# Patient Record
Sex: Female | Born: 1945 | Race: White | Hispanic: No | Marital: Married | Smoking: Former smoker
Health system: Southern US, Community
[De-identification: ages and names within clinical notes are randomized; demographics above are authoritative.]

## PROBLEM LIST (undated history)

## (undated) DIAGNOSIS — E119 Type 2 diabetes mellitus without complications: Secondary | ICD-10-CM

## (undated) DIAGNOSIS — R238 Other skin changes: Secondary | ICD-10-CM

## (undated) DIAGNOSIS — C50919 Malignant neoplasm of unspecified site of unspecified female breast: Secondary | ICD-10-CM

## (undated) DIAGNOSIS — I809 Phlebitis and thrombophlebitis of unspecified site: Secondary | ICD-10-CM

## (undated) DIAGNOSIS — E7800 Pure hypercholesterolemia, unspecified: Secondary | ICD-10-CM

## (undated) DIAGNOSIS — R233 Spontaneous ecchymoses: Secondary | ICD-10-CM

## (undated) DIAGNOSIS — G8929 Other chronic pain: Secondary | ICD-10-CM

## (undated) DIAGNOSIS — Z9221 Personal history of antineoplastic chemotherapy: Secondary | ICD-10-CM

## (undated) DIAGNOSIS — J302 Other seasonal allergic rhinitis: Secondary | ICD-10-CM

## (undated) DIAGNOSIS — R51 Headache: Secondary | ICD-10-CM

## (undated) DIAGNOSIS — C801 Malignant (primary) neoplasm, unspecified: Secondary | ICD-10-CM

## (undated) DIAGNOSIS — M1990 Unspecified osteoarthritis, unspecified site: Secondary | ICD-10-CM

## (undated) DIAGNOSIS — M549 Dorsalgia, unspecified: Secondary | ICD-10-CM

## (undated) DIAGNOSIS — I10 Essential (primary) hypertension: Secondary | ICD-10-CM

## (undated) HISTORY — PX: COLONOSCOPY: SHX174

## (undated) HISTORY — PX: TUBAL LIGATION: SHX77

## (undated) HISTORY — PX: APPENDECTOMY: SHX54

## (undated) HISTORY — PX: BREAST EXCISIONAL BIOPSY: SUR124

## (undated) HISTORY — DX: Pure hypercholesterolemia, unspecified: E78.00

---

## 1998-12-18 ENCOUNTER — Emergency Department (HOSPITAL_COMMUNITY): Admission: EM | Admit: 1998-12-18 | Discharge: 1998-12-18 | Payer: Self-pay | Admitting: Emergency Medicine

## 2000-01-07 ENCOUNTER — Emergency Department (HOSPITAL_COMMUNITY): Admission: EM | Admit: 2000-01-07 | Discharge: 2000-01-07 | Payer: Self-pay | Admitting: Emergency Medicine

## 2000-01-07 ENCOUNTER — Encounter: Payer: Self-pay | Admitting: Emergency Medicine

## 2000-02-27 ENCOUNTER — Encounter: Payer: Self-pay | Admitting: Emergency Medicine

## 2000-02-27 ENCOUNTER — Emergency Department (HOSPITAL_COMMUNITY): Admission: EM | Admit: 2000-02-27 | Discharge: 2000-02-27 | Payer: Self-pay | Admitting: Emergency Medicine

## 2000-11-13 ENCOUNTER — Emergency Department (HOSPITAL_COMMUNITY): Admission: EM | Admit: 2000-11-13 | Discharge: 2000-11-13 | Payer: Self-pay | Admitting: Emergency Medicine

## 2000-11-13 ENCOUNTER — Encounter: Payer: Self-pay | Admitting: Emergency Medicine

## 2000-11-26 ENCOUNTER — Emergency Department (HOSPITAL_COMMUNITY): Admission: EM | Admit: 2000-11-26 | Discharge: 2000-11-26 | Payer: Self-pay | Admitting: Emergency Medicine

## 2001-04-18 ENCOUNTER — Emergency Department (HOSPITAL_COMMUNITY): Admission: EM | Admit: 2001-04-18 | Discharge: 2001-04-18 | Payer: Self-pay | Admitting: Emergency Medicine

## 2001-06-14 ENCOUNTER — Encounter: Admission: RE | Admit: 2001-06-14 | Discharge: 2001-06-28 | Payer: Self-pay | Admitting: Occupational Medicine

## 2003-10-17 ENCOUNTER — Emergency Department (HOSPITAL_COMMUNITY): Admission: AD | Admit: 2003-10-17 | Discharge: 2003-10-17 | Payer: Self-pay | Admitting: Family Medicine

## 2004-02-21 ENCOUNTER — Encounter: Admission: RE | Admit: 2004-02-21 | Discharge: 2004-02-21 | Payer: Self-pay | Admitting: Internal Medicine

## 2004-04-29 ENCOUNTER — Encounter: Admission: RE | Admit: 2004-04-29 | Discharge: 2004-05-14 | Payer: Self-pay | Admitting: Nurse Practitioner

## 2004-05-27 ENCOUNTER — Ambulatory Visit (HOSPITAL_COMMUNITY): Admission: RE | Admit: 2004-05-27 | Discharge: 2004-05-27 | Payer: Self-pay | Admitting: Gastroenterology

## 2008-11-20 ENCOUNTER — Emergency Department (HOSPITAL_COMMUNITY): Admission: EM | Admit: 2008-11-20 | Discharge: 2008-11-20 | Payer: Self-pay | Admitting: Emergency Medicine

## 2009-06-25 ENCOUNTER — Emergency Department (HOSPITAL_COMMUNITY): Admission: EM | Admit: 2009-06-25 | Discharge: 2009-06-25 | Payer: Self-pay | Admitting: Family Medicine

## 2009-12-17 ENCOUNTER — Emergency Department (HOSPITAL_COMMUNITY): Admission: EM | Admit: 2009-12-17 | Discharge: 2009-12-17 | Payer: Self-pay | Admitting: Emergency Medicine

## 2011-01-11 ENCOUNTER — Encounter: Payer: Self-pay | Admitting: Internal Medicine

## 2011-05-08 NOTE — Op Note (Signed)
Yvonne [name], Yvonne [name]                      ACCOUNT NO.:  [id number]   MEDICAL RECORD NO.:  [id number]                   PATIENT TYPE:  AMB   LOCATION:  ENDO                                 FACILITY:  [facility]   PHYSICIAN:  James L. Malon Kindle., M.D.          DATE OF BIRTH:  09-10-1946   DATE OF PROCEDURE:  05/27/2004  DATE OF DISCHARGE:                                 OPERATIVE REPORT   PROCEDURE:  Colonoscopy.   MEDICATIONS GIVEN:  Fentanyl 100 mcg, Versed 10 mg IV.   INDICATIONS FOR PROCEDURE:  Strong family history of colon cancer, two of  her siblings have had colon cancer.   DESCRIPTION OF PROCEDURE:  The procedure was explained and patient consent  obtained.  With the patient in the left lateral decubitus position, the  Olympus adjustable pediatric colonoscope was inserted and advanced.  The  prep was marginal, I was able to irrigate and got a quite good look after  irrigation.  There was still some sticky adherent stool in places.  I was  able to easily advance to the cecum with the use of abdominal pressure and  position changes.  The ileocecal valve and appendiceal orifice were seen.  The scope was withdrawn.  The cecum, ascending colon, hepatic flexure,  transverse colon, splenic flexure, descending and sigmoid colon were seen  well.  No polyps seen.  No significant diverticular disease.  The rectum was  free of polyps.  The scope was withdrawn.  The patient tolerated the  procedure well.   ASSESSMENT:  Normal colonoscopy in a woman with a very strong family history  of colon cancer, V16.0.   PLAN:  Will recommend repeating in five years.  Check stool for blood  yearly.                                               James L. Malon Kindle., M.D.    Yvonne [name]  D:  05/27/2004  T:  05/27/2004  Job:  [id number]   cc:   Fleet Contras, M.D.  [address]  [place]  [place] [address]  Fax: [phone]

## 2011-12-22 HISTORY — PX: MASTECTOMY: SHX3

## 2012-02-17 ENCOUNTER — Other Ambulatory Visit: Payer: Self-pay | Admitting: Internal Medicine

## 2012-02-17 DIAGNOSIS — Z1231 Encounter for screening mammogram for malignant neoplasm of breast: Secondary | ICD-10-CM

## 2012-03-02 ENCOUNTER — Ambulatory Visit
Admission: RE | Admit: 2012-03-02 | Discharge: 2012-03-02 | Disposition: A | Discharge status: Home / Self Care | Payer: Self-pay | Source: Ambulatory Visit | Attending: Internal Medicine | Admitting: Internal Medicine

## 2012-03-02 DIAGNOSIS — Z1231 Encounter for screening mammogram for malignant neoplasm of breast: Secondary | ICD-10-CM

## 2012-03-08 ENCOUNTER — Other Ambulatory Visit: Payer: Self-pay | Admitting: Internal Medicine

## 2012-03-08 DIAGNOSIS — R928 Other abnormal and inconclusive findings on diagnostic imaging of breast: Secondary | ICD-10-CM

## 2012-03-17 ENCOUNTER — Other Ambulatory Visit: Payer: Self-pay

## 2012-03-25 ENCOUNTER — Other Ambulatory Visit: Payer: Self-pay | Admitting: Internal Medicine

## 2012-03-25 ENCOUNTER — Ambulatory Visit
Admission: RE | Admit: 2012-03-25 | Discharge: 2012-03-25 | Disposition: A | Discharge status: Home / Self Care | Payer: 59 | Source: Ambulatory Visit | Attending: Internal Medicine | Admitting: Internal Medicine

## 2012-03-25 DIAGNOSIS — C801 Malignant (primary) neoplasm, unspecified: Secondary | ICD-10-CM

## 2012-03-25 DIAGNOSIS — R928 Other abnormal and inconclusive findings on diagnostic imaging of breast: Secondary | ICD-10-CM

## 2012-03-25 HISTORY — DX: Malignant (primary) neoplasm, unspecified: C80.1

## 2012-03-28 ENCOUNTER — Other Ambulatory Visit: Payer: Self-pay | Admitting: Internal Medicine

## 2012-03-28 ENCOUNTER — Ambulatory Visit
Admission: RE | Admit: 2012-03-28 | Discharge: 2012-03-28 | Disposition: A | Discharge status: Home / Self Care | Payer: 59 | Source: Ambulatory Visit | Attending: Internal Medicine | Admitting: Internal Medicine

## 2012-03-28 DIAGNOSIS — C50911 Malignant neoplasm of unspecified site of right female breast: Secondary | ICD-10-CM

## 2012-03-28 DIAGNOSIS — R928 Other abnormal and inconclusive findings on diagnostic imaging of breast: Secondary | ICD-10-CM

## 2012-03-29 ENCOUNTER — Other Ambulatory Visit: Payer: Self-pay | Admitting: *Deleted

## 2012-03-29 DIAGNOSIS — C50411 Malignant neoplasm of upper-outer quadrant of right female breast: Secondary | ICD-10-CM | POA: Insufficient documentation

## 2012-03-29 DIAGNOSIS — C50419 Malignant neoplasm of upper-outer quadrant of unspecified female breast: Secondary | ICD-10-CM

## 2012-04-01 ENCOUNTER — Ambulatory Visit (HOSPITAL_COMMUNITY)
Admission: RE | Admit: 2012-04-01 | Discharge: 2012-04-01 | Disposition: A | Discharge status: Home / Self Care | Payer: 59 | Source: Ambulatory Visit | Attending: Internal Medicine | Admitting: Internal Medicine

## 2012-04-01 DIAGNOSIS — C50419 Malignant neoplasm of upper-outer quadrant of unspecified female breast: Secondary | ICD-10-CM | POA: Insufficient documentation

## 2012-04-01 DIAGNOSIS — C50911 Malignant neoplasm of unspecified site of right female breast: Secondary | ICD-10-CM

## 2012-04-01 DIAGNOSIS — C773 Secondary and unspecified malignant neoplasm of axilla and upper limb lymph nodes: Secondary | ICD-10-CM | POA: Insufficient documentation

## 2012-04-01 LAB — CREATININE, SERUM: GFR calc Af Amer: 77 mL/min — ABNORMAL LOW (ref >90)

## 2012-04-01 MED ORDER — GADOBENATE DIMEGLUMINE 529 MG/ML IV SOLN
20.0000 mL | Freq: Once | INTRAVENOUS | Status: AC | PRN
  Administered 2012-04-01: 20 mL via INTRAVENOUS

## 2012-04-04 ENCOUNTER — Other Ambulatory Visit: Payer: Self-pay | Admitting: Internal Medicine

## 2012-04-04 DIAGNOSIS — R921 Mammographic calcification found on diagnostic imaging of breast: Secondary | ICD-10-CM

## 2012-04-05 ENCOUNTER — Ambulatory Visit
Admission: RE | Admit: 2012-04-05 | Discharge: 2012-04-05 | Disposition: A | Discharge status: Home / Self Care | Payer: 59 | Source: Ambulatory Visit | Attending: Internal Medicine | Admitting: Internal Medicine

## 2012-04-05 ENCOUNTER — Other Ambulatory Visit: Payer: Self-pay | Admitting: Internal Medicine

## 2012-04-05 DIAGNOSIS — R921 Mammographic calcification found on diagnostic imaging of breast: Secondary | ICD-10-CM

## 2012-04-05 DIAGNOSIS — R928 Other abnormal and inconclusive findings on diagnostic imaging of breast: Secondary | ICD-10-CM

## 2012-04-06 ENCOUNTER — Ambulatory Visit: Payer: 59 | Attending: General Surgery | Admitting: Physical Therapy

## 2012-04-06 ENCOUNTER — Other Ambulatory Visit (HOSPITAL_BASED_OUTPATIENT_CLINIC_OR_DEPARTMENT_OTHER): Payer: 59 | Admitting: Lab

## 2012-04-06 ENCOUNTER — Encounter: Payer: Self-pay | Admitting: Oncology

## 2012-04-06 ENCOUNTER — Encounter (INDEPENDENT_AMBULATORY_CARE_PROVIDER_SITE_OTHER): Payer: Self-pay | Admitting: General Surgery

## 2012-04-06 ENCOUNTER — Encounter: Payer: Self-pay | Admitting: *Deleted

## 2012-04-06 ENCOUNTER — Ambulatory Visit (HOSPITAL_BASED_OUTPATIENT_CLINIC_OR_DEPARTMENT_OTHER): Payer: 59 | Admitting: Oncology

## 2012-04-06 ENCOUNTER — Ambulatory Visit (HOSPITAL_BASED_OUTPATIENT_CLINIC_OR_DEPARTMENT_OTHER): Payer: Commercial Managed Care - PPO | Admitting: General Surgery

## 2012-04-06 ENCOUNTER — Ambulatory Visit: Payer: 59

## 2012-04-06 ENCOUNTER — Ambulatory Visit
Admission: RE | Admit: 2012-04-06 | Discharge: 2012-04-06 | Disposition: A | Discharge status: Home / Self Care | Payer: 59 | Source: Ambulatory Visit | Attending: Radiation Oncology | Admitting: Radiation Oncology

## 2012-04-06 VITALS — BP 142/78 | HR 62 | Temp 97.9°F | Ht 64.0 in | Wt 211.6 lb

## 2012-04-06 DIAGNOSIS — C50419 Malignant neoplasm of upper-outer quadrant of unspecified female breast: Secondary | ICD-10-CM

## 2012-04-06 DIAGNOSIS — C50919 Malignant neoplasm of unspecified site of unspecified female breast: Secondary | ICD-10-CM

## 2012-04-06 DIAGNOSIS — C50911 Malignant neoplasm of unspecified site of right female breast: Secondary | ICD-10-CM

## 2012-04-06 DIAGNOSIS — I10 Essential (primary) hypertension: Secondary | ICD-10-CM

## 2012-04-06 DIAGNOSIS — N649 Disorder of breast, unspecified: Secondary | ICD-10-CM

## 2012-04-06 DIAGNOSIS — C773 Secondary and unspecified malignant neoplasm of axilla and upper limb lymph nodes: Secondary | ICD-10-CM

## 2012-04-06 DIAGNOSIS — M1990 Unspecified osteoarthritis, unspecified site: Secondary | ICD-10-CM

## 2012-04-06 DIAGNOSIS — IMO0001 Reserved for inherently not codable concepts without codable children: Secondary | ICD-10-CM | POA: Insufficient documentation

## 2012-04-06 DIAGNOSIS — R293 Abnormal posture: Secondary | ICD-10-CM | POA: Insufficient documentation

## 2012-04-06 DIAGNOSIS — Z9049 Acquired absence of other specified parts of digestive tract: Secondary | ICD-10-CM

## 2012-04-06 DIAGNOSIS — E785 Hyperlipidemia, unspecified: Secondary | ICD-10-CM

## 2012-04-06 DIAGNOSIS — Z170 Estrogen receptor positive status [ER+]: Secondary | ICD-10-CM

## 2012-04-06 LAB — CANCER ANTIGEN 27.29: CA 27.29: 26 U/mL (ref 0–39)

## 2012-04-06 LAB — COMPREHENSIVE METABOLIC PANEL
ALT: 29 U/L (ref 0–35)
AST: 29 U/L (ref 0–37)
Albumin: 3.9 g/dL (ref 3.5–5.2)
BUN: 19 mg/dL (ref 6–23)
CO2: 30 mEq/L (ref 19–32)
Calcium: 10.2 mg/dL (ref 8.4–10.5)
Chloride: 98 mEq/L (ref 96–112)
Potassium: 2.9 mEq/L — ABNORMAL LOW (ref 3.5–5.3)

## 2012-04-06 LAB — CBC WITH DIFFERENTIAL/PLATELET
BASO%: 0.7 % (ref 0.0–2.0)
Eosinophils Absolute: 0.2 10*3/uL (ref 0.0–0.5)
HCT: 39.7 % (ref 34.8–46.6)
HGB: 13.4 g/dL (ref 11.6–15.9)
LYMPH%: 30.7 % (ref 14.0–49.7)
MCHC: 33.6 g/dL (ref 31.5–36.0)
MONO#: 0.4 10*3/uL (ref 0.1–0.9)
NEUT#: 5 10*3/uL (ref 1.5–6.5)
NEUT%: 60.6 % (ref 38.4–76.8)
Platelets: 201 10*3/uL (ref 145–400)
WBC: 8.3 10*3/uL (ref 3.9–10.3)
lymph#: 2.5 10*3/uL (ref 0.9–3.3)

## 2012-04-06 MED ORDER — DEXTROSE 5 % IV SOLN: 900.0000 mg | Freq: Once | INTRAVENOUS | Status: DC

## 2012-04-06 NOTE — Progress Notes (Signed)
Mailed after appt letter to pt. 

## 2012-04-06 NOTE — Progress Notes (Signed)
Patient ID: Yvonne [name], female   DOB: October 19, 1946, 65 y.o.   MRN: [id number]  No chief complaint on file.   HPI Yvonne [name] is a 66 y.o. female.  This patient is seen today in our multidisciplinary breast clinic for a newly diagnosed right breast cancer. She says that she first noticed a lump found on self breast exam and presented to her physician and followup mammogram ultrasound and MRI were ordered. She says that she does her self breast exams routinely and has not noticed anything previously. She did not have any significant family history for breast cancer and denies any systemic symptoms such as headaches, bony pains, or weight loss. 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 The lesion measured approximately 3.8 cm x 3.6 cm in size. She also had another nodule in another location in the right breast and she has a biopsy scheduled for this next week. 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 HPI  Past Medical History  Diagnosis Date  . Elevated cholesterol     Past Surgical History  Procedure Date  . Appendectomy     Family History  Problem Relation Age of Onset  . Cancer Mother     ovarian cancer  . Cancer Sister     colon cancer  . Cancer Brother     colon cancer  . Cancer Brother     prostate cancer    Social History History  Substance Use Topics  . Smoking status: Never Smoker   . Smokeless tobacco: Not on file  . Alcohol Use: No    Allergies  Allergen Reactions  . Penicillins     Current Outpatient Prescriptions  Medication Sig Dispense Refill  . aspirin 81 MG tablet Take 81 mg by  mouth daily.      . Calcium Carbonate-Vitamin D (CALCIUM + D PO) Take 1 tablet by mouth daily.      . celecoxib (CELEBREX) 200 MG capsule Take 200 mg by mouth daily.      . cetirizine (ZYRTEC) 10 MG chewable tablet Chew 10 mg by mouth daily.      . fluticasone (VERAMYST) 27.5 MCG/SPRAY nasal spray Place 2 sprays into the nose daily.      . Multiple Vitamins-Minerals (CERTAVITE SENIOR/ANTIOXIDANT) TABS Take 1 tablet by mouth.      . rosuvastatin (CRESTOR) 20 MG tablet Take 20 mg by mouth daily.      Marland Kitchen triamterene-hydrochlorothiazide (MAXZIDE-25) 37.5-25 MG per tablet Take 1 tablet by mouth daily.        Review of Systems Review of Systems All other review of systems negative or noncontributory except as stated in the HPI  There were no vitals taken for this visit.  Physical Exam Physical Exam Physical Exam  Nursing note and vitals reviewed. Constitutional: She is oriented to person, place, and time. She appears well-developed and well-nourished. No distress.  HENT:  Head: Normocephalic and atraumatic.  Mouth/Throat: No oropharyngeal exudate.  Eyes: Conjunctivae and EOM are normal. Pupils are equal, round, and reactive to light. Right eye exhibits no discharge. Left eye exhibits no discharge. No scleral icterus.  Neck: Normal range of motion. Neck supple.  No tracheal deviation present.  Cardiovascular: Normal rate, regular rhythm, normal heart sounds and intact distal pulses.   Pulmonary/Chest: Effort normal and breath sounds normal. No stridor. No respiratory distress. She has no wheezes.  Abdominal: Soft. Bowel sounds are normal. She exhibits no distension and no mass. There is no tenderness. There is no rebound and no guarding.  Musculoskeletal: Normal range of motion. She exhibits no edema and no tenderness.  Neurological: She is alert and oriented to person, place, and time.  Skin: Skin is warm and dry. No rash noted. She is not diaphoretic. No erythema. No pallor.  Psychiatric:  She has a normal mood and affect. Her behavior is normal. Judgment and thought content normal.  Breast: her right breast shows some dimpling in the upper outer quadrant of the breast with a palpable mass measuring approximately 4-5 cm in diameter consistent with the known carcinoma. Did not appreciate any other palpable or suspicious masses in the right breast and I do not feel any axillary lymphadenopathy on exam. 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 She has already met with the radiation oncologist and the medical oncologist as well to discuss adjuvant therapies. She is most likely going to require chemotherapy and radiation for the axilla as well. We discussed the risks and benefits of breast conservation therapy versus MRM and she would like to proceed with modified radical mastectomy on the right with left-sided Mediport placement and left-sided needle localized lumpectomy. She expressed understanding of the possible need for further surgery on the left side if her biopsy comes back positive. We also discussed the risks of the Mediport including infection, bleeding, pain, scarring, port malfunction, need for open surgery, pneumothorax or hemothorax, and arrhythmia. We also discussed the risks of modified radical mastectomy including infection, bleeding, pain, scarring, recurrent tumor, nerve injury, lymphedema and the need for possible repeat surgery and she expressed understanding and would like to proceed with right-sided modified radical mastectomy  with left-sided Mediport placement and left-sided needle localized lumpectomy. Because she is interested in modified radical mastectomy I do not think that we need to proceed with the right-sided biopsy of the additional lesion as this will be excised with the mastectomy. 45 min. Was spent counseling this patient.    Plan    We will schedule this as soon as possible.       [name] DAVID 04/06/2012, 4:29 PM

## 2012-04-07 ENCOUNTER — Telehealth: Payer: Self-pay | Admitting: *Deleted

## 2012-04-07 ENCOUNTER — Other Ambulatory Visit: Payer: 59 | Admitting: Lab

## 2012-04-07 ENCOUNTER — Other Ambulatory Visit (INDEPENDENT_AMBULATORY_CARE_PROVIDER_SITE_OTHER): Payer: Self-pay | Admitting: General Surgery

## 2012-04-07 ENCOUNTER — Encounter: Payer: 59 | Admitting: Genetic Counselor

## 2012-04-07 ENCOUNTER — Encounter: Payer: Self-pay | Admitting: Oncology

## 2012-04-07 DIAGNOSIS — C50911 Malignant neoplasm of unspecified site of right female breast: Secondary | ICD-10-CM

## 2012-04-07 NOTE — Progress Notes (Signed)
Put cancer form and daughter's fmla paper on nurse's desk.

## 2012-04-07 NOTE — Telephone Encounter (Signed)
gave patient appointment for 04-2012 printed out calendar and gave to the patient 

## 2012-04-08 ENCOUNTER — Encounter: Payer: Self-pay | Admitting: *Deleted

## 2012-04-10 ENCOUNTER — Other Ambulatory Visit: Payer: Self-pay | Admitting: Oncology

## 2012-04-10 DIAGNOSIS — E1129 Type 2 diabetes mellitus with other diabetic kidney complication: Secondary | ICD-10-CM | POA: Insufficient documentation

## 2012-04-10 DIAGNOSIS — E785 Hyperlipidemia, unspecified: Secondary | ICD-10-CM | POA: Insufficient documentation

## 2012-04-10 DIAGNOSIS — E1169 Type 2 diabetes mellitus with other specified complication: Secondary | ICD-10-CM | POA: Insufficient documentation

## 2012-04-10 DIAGNOSIS — E119 Type 2 diabetes mellitus without complications: Secondary | ICD-10-CM | POA: Insufficient documentation

## 2012-04-10 DIAGNOSIS — I10 Essential (primary) hypertension: Secondary | ICD-10-CM | POA: Insufficient documentation

## 2012-04-10 DIAGNOSIS — Z9049 Acquired absence of other specified parts of digestive tract: Secondary | ICD-10-CM | POA: Insufficient documentation

## 2012-04-10 DIAGNOSIS — M1990 Unspecified osteoarthritis, unspecified site: Secondary | ICD-10-CM | POA: Insufficient documentation

## 2012-04-10 HISTORY — DX: Type 2 diabetes mellitus without complications: E11.9

## 2012-04-10 NOTE — Progress Notes (Signed)
ID: ALYSABETH SCALIA   DOB: [birth year]  MR#: [id number]  WUJ#:[id number]  HISTORY OF PRESENT ILLNESS: The patient underwent screening mammography 03/02/2012. (Prior mammogram was 2005). Possible mass was noted in the right breast and she was recalled for additional views 03/25/2012. These showed a dense spiculated mass with overlying skin retraction in the upper outer quadrant of the right breast. There were associated coarse linear calcifications. On physical exam there was an area of dimpling in the upper outer quadrant of the right breast and a 5 cm mass was palpable. By ultrasound, this was irregular, hypoechoic, and measured 3.3 cm. In addition, an abnormal lymph node in the right axilla with the thickened cortex measured 1.8 cm.  In the opposite breast, Dr. Whitney Muse found an irregular hypoechoic mass with lobulated margins measuring 2.3 cm. The left axilla was unremarkable. Bilateral breast MRI was obtained 04/01/2012, and showed the mass in question in the right breast to measure 3.8 cm maximally, with overlying skin retraction. In addition, there was a second enhancing bilobed mass in the lower outer quadrant of the right breast, measuring 1 point centimeter. This was felt to possibly represent an intramammary lymph node.  Biopsy of the right breast and right axillary lymph node 03/25/2012 both showed ([id number])  invasive ductal carcinoma, grade 2, estrogen and progesterone receptor positive, both at 100%, with an MIB-1 39%, and no HER-2 amplification. 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 Biopsy of the second mass in the right breast, which is in a different quadrant from the one already biopsied, is pending.  INTERVAL HISTORY: The patient tolerated her biopsies without unusual complications. She was evaluated in the multidisciplinary breast cancer clinic 04/06/2012 for a definitive treatment  plan.  REVIEW OF SYSTEMS: The changes in the right breast appear long-standing and are easily palpable and noticeable, although the patient denies being aware of them until recently. She has had no systemic symptoms suggestive of metastatic disease, in particular no unexplained weight loss or fatigue, no rash or bleeding, no unusual headaches visual changes or cough, phlegm production or pleurisy. She has mild seasonal sinus allergies. She has chronic low back and joint pain, particularly involving her knees. Otherwise a detailed review of systems was noncontributory  PAST MEDICAL HISTORY: Past Medical History  Diagnosis Date  . Elevated cholesterol     PAST SURGICAL HISTORY: Past Surgical History  Procedure Date  . Appendectomy     FAMILY HISTORY Family History  Problem Relation Age of Onset  . Cancer Mother     ovarian cancer  . Cancer Sister     colon cancer  . Cancer Brother     colon cancer  . Cancer Brother     prostate cancer  the patient's father died at the age of 85, from "leukemia." the patient's mother died at the age of 46, for what may have been uterine or ovarian cancer. The patient had 4 brothers and 2 sisters one sister has a history of colon cancer diagnosed at age 68 one brother has a history of colon cancer diagnosed at age 65 a second brother died from complications of prostate cancer. There is no other breast cancer in the family to her knowledge.  GYNECOLOGIC HISTORY: Menarche age 58, she is GX P5, first pregnancy to term age 66, last period approximately age 84. She did not use hormone replacement  SOCIAL HISTORY: Nazifa works as an Freight forwarder, second shift, [employer]. She is married to [facility], who was present at her multidisciplinary breast cancer clinic visit. Fayrene Fearing is disabled secondary to COPD. Laycee had 5 children from a prior marriage. There are Annabell Sabal, 38, who was also present at the [facility] visit, and works for  [employer]. Gershon Crane, 42, who is employed in Holiday representative, Hedwig Morton 46 who works as a Advertising copywriter, and Sports administrator 47 who works as a Lawyer for [employer]. The fifth child died at age 34. The patient has 13 grandchildren and 9 great grandchildren. She is not a Advice worker.   ADVANCED DIRECTIVES: not in place  HEALTH MAINTENANCE: History  Substance Use Topics  . Smoking status: Never Smoker   . Smokeless tobacco: Not on file  . Alcohol Use: No     Colonoscopy: 2007/ repeat is due  PAP: March 2012  Bone density: never  Lipid panel: per Dr. Mila Palmer  Allergies  Allergen Reactions  . Penicillins     Current Outpatient Prescriptions  Medication Sig Dispense Refill  . aspirin 81 MG tablet Take 81 mg by mouth daily.      . Calcium Carbonate-Vitamin D (CALCIUM + D PO) Take 1 tablet by mouth daily.      . celecoxib (CELEBREX) 200 MG capsule Take 200 mg by mouth daily.      . cetirizine (ZYRTEC) 10 MG chewable tablet Chew 10 mg by mouth daily.      . fluticasone (VERAMYST) 27.5 MCG/SPRAY nasal spray Place 2 sprays into the nose daily.      . Multiple Vitamins-Minerals (CERTAVITE SENIOR/ANTIOXIDANT) TABS Take 1 tablet by mouth.      . rosuvastatin (CRESTOR) 20 MG tablet Take 20 mg by mouth daily.      Marland Kitchen triamterene-hydrochlorothiazide (MAXZIDE-25) 37.5-25 MG per tablet Take 1 tablet by mouth daily.       Current Facility-Administered Medications  Medication Dose Route Frequency Provider Last Rate Last Dose  . clindamycin (CLEOCIN) 900 mg in dextrose 5 % 50 mL IVPB  900 mg Intravenous Once [name], DO        OBJECTIVE: middle-aged white woman who appears anxious Filed Vitals:   04/06/12 1237  BP: 142/78  Pulse: 62  Temp: 97.9 F (36.6 C)     Body mass index is 36.32 kg/(m^2).    ECOG FS: 0  Sclerae unicteric Oropharynx clear No peripheral adenopathy Lungs no rales or rhonchi Heart regular rate and rhythm Abd benign MSK no focal spinal tenderness,  no peripheral edema Neuro: nonfocal Breasts: the right breast has an easily palpable mass measuring approximately 5 cm by palpation. This is associated with an area of skin dimpling measuring approximately 3 cm across. I did not feel a second mass. There are no other areas of skin change of concern. There is no nipple retraction. The right axilla was unremarkable. I did not feel a definite mass in the left breast. The left axilla was unremarkable as well.  LAB RESULTS: Lab Results  Component Value Date   WBC 8.3 04/06/2012   NEUTROABS 5.0 04/06/2012   HGB 13.4 04/06/2012   HCT 39.7 04/06/2012   MCV 87.4 04/06/2012   PLT 201 04/06/2012      Chemistry      Component Value Date/Time   NA 139 04/06/2012 1211   K 2.9* 04/06/2012 1211   CL 98 04/06/2012 1211   CO2 30 04/06/2012 1211   BUN 19 04/06/2012 1211   CREATININE 0.79 04/06/2012 1211      Component  Value Date/Time   CALCIUM 10.2 04/06/2012 1211   ALKPHOS 60 04/06/2012 1211   AST 29 04/06/2012 1211   ALT 29 04/06/2012 1211   BILITOT 0.5 04/06/2012 1211       Lab Results  Component Value Date   LABCA2 26 04/06/2012    No components found with this basename: [id number]    No results found for this basename: INR:1;PROTIME:1 in the last 168 hours  Urinalysis No results found for this basename: colorurine,  appearanceur,  labspec,  phurine,  glucoseu,  hgbur,  bilirubinur,  ketonesur,  proteinur,  urobilinogen,  nitrite,  leukocytesur    STUDIES:  04/05/2012  *RADIOLOGY REPORT*  Clinical Data: New diagnosis right-sided breast cancer  BILATERAL BREAST MRI WITH AND WITHOUT CONTRAST  Technique: Multiplanar, multisequence MR images of both breasts were obtained prior to and following the intravenous administration of 20ml of Multihance.  Three dimensional images were evaluated at the independent DynaCad workstation.  Comparison:  Mammogram dated 03/02/2012  Findings: Foci of nonspecific enhancement seen bilaterally.  A round, heterogeneously  enhancing mass with spiculated margins is seen in the upper-outer quadrant of the right breast, anteriorly measuring 3.8 x 3.6 x 3.5 cm corresponding to the known, biopsy- proven malignancy.  A biopsy clip is seen in association with the mass.  There is overlying skin retraction although, no skin enhancement is seen at this time.  In the lower outer quadrant of the right breast, middle third, there is an enhancing, bilobed mass with circumscribed and irregular margins,  associated high T2 signal measuring 1.0 x 0.8 x 0.5 cm.  This could represent an intramammary lymph node although it remains suspicious considering the irregular margins.  A lymph node with and obliterated fatty hilum and thickened cortex is seen in the right axilla measuring 1.7 x 1.1 x 2.4 cm corresponding to the biopsy-proven metastatic disease.  No other axillary or internal mammary adenopathy is identified. No suspicious enhancement seen in the left breast.  No left axillary or internal mammary adenopathy is present.  IMPRESSION: 1.  Known right-sided breast cancer and metastatic disease, right axilla.  Suspicious mass, lower outer quadrant of the right breast detailed above.  If breast conservation therapy is desired, a second look ultrasound is recommended.  If this is not identified, an MRI guided biopsy is recommended. 2.  No MRI specific evidence of malignancy, left breast.  THREE-DIMENSIONAL MR IMAGE RENDERING ON INDEPENDENT WORKSTATION:  Three-dimensional MR images were rendered by post-processing of the original MR data on an independent workstation.  The three- dimensional MR images were interpreted, and findings were reported in the accompanying complete MRI report for this study.  BI-RADS CATEGORY 6:  Known biopsy-proven malignancy - appropriate action should be taken.  Original Report Authenticated By: Hiram Gash, M.D.    ASSESSMENT: 66 year old [place] woman status post right breast and right axillary lymph node biopsy  03/25/2012, both showing an invasive ductal carcinoma, clinically T1c N1, grade 2, estrogen and progesterone both 100% positive, with an MIB-1 of 39%, and no HER-2 amplification. There is a second right breast mass in a different quadrant, which has not been biopsied, and which may be an intramammary lymph node. There is an abnormal area in the left breast, biopsied for 16 2013, showing an intraductal papilloma.  PLAN: we spent the better part of her hour-long visit today discussing her situation. She understands that there is no survival advantage to lumpectomy plus radiation as opposed to mastectomy. If she wanted to keep her  right breast, we would proceed to biopsy of the second lesion to document or rule out the presence of multicentric disease. A recommendation was to proceed to additional right breast biopsy and then mastectomy if there is multicentric disease or lumpectomy if not.  However the patient is very anxious about postponing surgery, "I just want to get it over with", and for that reason is leaning towards mastectomy with axillary lymph node dissection on the right and lumpectomy on the left, rather than waiting for another biopsy. She understands she will need postmastectomy radiation as well as adjuvant chemotherapy, which is the standard of care as per NCCN guidelines. (I quoted her a risk of recurrence of 55% with local treatment only, dropping to 24% residual risk if she does standard chemotherapy and hormone therapy). For that reason she will benefit from port placement. Finally, given the uncertain diagnosis in her mother's case, she may benefit from genetics counseling.  She has a return appointment with me in mid May. By then we should have all the relevant data and should be ready to proceed with adjuvant treatment.   Komal Stangelo C    04/10/2012

## 2012-04-11 ENCOUNTER — Telehealth: Payer: Self-pay | Admitting: *Deleted

## 2012-04-11 ENCOUNTER — Encounter: Payer: Self-pay | Admitting: *Deleted

## 2012-04-11 NOTE — Telephone Encounter (Signed)
Spoke to pt concerning [facility] from 04/06/12.  Pt denies questions or concerns regarding dx or treatment care plan.  Encourage pt to call with needs.  Received verbal understanding.  Contact information given.  Left FMLA and cancer care insurance at registration desk for pt to pick up.  Informed pt packet was ready to pick up at her convenience.

## 2012-04-11 NOTE — Telephone Encounter (Signed)
left voice message to inform the patient of the new date and time on 04-28-2012 for chemo class at 10:00am

## 2012-04-12 ENCOUNTER — Other Ambulatory Visit: Payer: Self-pay | Admitting: Oncology

## 2012-04-12 ENCOUNTER — Other Ambulatory Visit: Payer: 59

## 2012-04-12 ENCOUNTER — Encounter (HOSPITAL_COMMUNITY): Payer: Self-pay | Admitting: Respiratory Therapy

## 2012-04-12 NOTE — Progress Notes (Signed)
ID: Yvonne [name]   DOB: [birth year]  MR#: [id number]  WUJ#:[id number]  HISTORY OF PRESENT ILLNESS: The patient underwent screening mammography 03/02/2012. (Prior mammogram was 2005). Possible mass was noted in the right breast and she was recalled for additional views 03/25/2012. These showed a dense spiculated mass with overlying skin retraction in the upper outer quadrant of the right breast. There were associated coarse linear calcifications. On physical exam there was an area of dimpling in the upper outer quadrant of the right breast and a 5 cm mass was palpable. By ultrasound, this was irregular, hypoechoic, and measured 3.3 cm. In addition, an abnormal lymph node in the right axilla with the thickened cortex measured 1.8 cm.  In the opposite breast, Dr. Whitney Muse found an irregular hypoechoic mass with lobulated margins measuring 2.3 cm. The left axilla was unremarkable. Bilateral breast MRI was obtained 04/01/2012, and showed the mass in question in the right breast to measure 3.8 cm maximally, with overlying skin retraction. In addition, there was a second enhancing bilobed mass in the lower outer quadrant of the right breast, measuring 1 point centimeter. This was felt to possibly represent an intramammary lymph node.  Biopsy of the right breast and right axillary lymph node 03/25/2012 both showed ([id number])  invasive ductal carcinoma, grade 2, estrogen and progesterone receptor positive, both at 100%, with an MIB-1 39%, and no HER-2 amplification. 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 Biopsy of the second mass in the right breast, which is in a different quadrant from the one already biopsied, is pending.  INTERVAL HISTORY: The patient tolerated her biopsies without unusual complications. She was evaluated in the multidisciplinary breast cancer clinic 04/06/2012 for a definitive treatment  plan.  REVIEW OF SYSTEMS: The changes in the right breast appear long-standing and are easily palpable and noticeable, although the patient denies being aware of them until recently. She has had no systemic symptoms suggestive of metastatic disease, in particular no unexplained weight loss or fatigue, no rash or bleeding, no unusual headaches visual changes or cough, phlegm production or pleurisy. She has mild seasonal sinus allergies. She has chronic low back and joint pain, particularly involving her knees. Otherwise a detailed review of systems was noncontributory  PAST MEDICAL HISTORY: Past Medical History  Diagnosis Date  . Elevated cholesterol     PAST SURGICAL HISTORY: Past Surgical History  Procedure Date  . Appendectomy     FAMILY HISTORY Family History  Problem Relation Age of Onset  . Cancer Mother     ovarian cancer  . Cancer Sister     colon cancer  . Cancer Brother     colon cancer  . Cancer Brother     prostate cancer  the patient's father died at the age of 46, from "leukemia." the patient's mother died at the age of 35, for what may have been uterine or ovarian cancer. The patient had 4 brothers and 2 sisters one sister has a history of colon cancer diagnosed at age 22 one brother has a history of colon cancer diagnosed at age 84 a second brother died from complications of prostate cancer. There is no other breast cancer in the family to her knowledge.  GYNECOLOGIC HISTORY: Menarche age 39, she is GX P5, first pregnancy to term age 65, last period approximately age 39. She did not use hormone replacement  SOCIAL HISTORY: Quyen works as an Freight forwarder, second shift, [employer]. She is married to [facility], who was present at her multidisciplinary breast cancer clinic visit. Fayrene Fearing is disabled secondary to COPD. Yahaira had 5 children from a prior marriage. There are Annabell Sabal, 38, who was also present at the [facility] visit, and works for  [employer]. Gershon Crane, 42, who is employed in Holiday representative, Hedwig Morton 46 who works as a Advertising copywriter, and Sports administrator 47 who works as a Lawyer for [employer]. The fifth child died at age 58. The patient has 13 grandchildren and 9 great grandchildren. She is not a Advice worker.   ADVANCED DIRECTIVES: not in place  HEALTH MAINTENANCE: History  Substance Use Topics  . Smoking status: Never Smoker   . Smokeless tobacco: Not on file  . Alcohol Use: No     Colonoscopy: 2007/ repeat is due  PAP: March 2012  Bone density: never  Lipid panel: per Dr. Mila Palmer  Allergies  Allergen Reactions  . Penicillins     Current Outpatient Prescriptions  Medication Sig Dispense Refill  . aspirin 81 MG tablet Take 81 mg by mouth daily.      . Calcium Carbonate-Vitamin D (CALCIUM + D PO) Take 1 tablet by mouth daily.      . celecoxib (CELEBREX) 200 MG capsule Take 200 mg by mouth daily.      . cetirizine (ZYRTEC) 10 MG chewable tablet Chew 10 mg by mouth daily.      . fluticasone (VERAMYST) 27.5 MCG/SPRAY nasal spray Place 2 sprays into the nose daily.      . Multiple Vitamins-Minerals (CERTAVITE SENIOR/ANTIOXIDANT) TABS Take 1 tablet by mouth.      . rosuvastatin (CRESTOR) 20 MG tablet Take 20 mg by mouth daily.      Marland Kitchen triamterene-hydrochlorothiazide (MAXZIDE-25) 37.5-25 MG per tablet Take 1 tablet by mouth daily.       Current Facility-Administered Medications  Medication Dose Route Frequency Provider Last Rate Last Dose  . clindamycin (CLEOCIN) 900 mg in dextrose 5 % 50 mL IVPB  900 mg Intravenous Once [name], DO        OBJECTIVE: middle-aged white woman who appears anxious There were no vitals filed for this visit.   There is no height or weight on file to calculate BMI.    ECOG FS: 0  Sclerae unicteric Oropharynx clear No peripheral adenopathy Lungs no rales or rhonchi Heart regular rate and rhythm Abd benign MSK no focal spinal tenderness, no peripheral  edema Neuro: nonfocal Breasts: the right breast has an easily palpable mass measuring approximately 5 cm by palpation. This is associated with an area of skin dimpling measuring approximately 3 cm across. I did not feel a second mass. There are no other areas of skin change of concern. There is no nipple retraction. The right axilla was unremarkable. I did not feel a definite mass in the left breast. The left axilla was unremarkable as well.  LAB RESULTS: Lab Results  Component Value Date   WBC 8.3 04/06/2012   NEUTROABS 5.0 04/06/2012   HGB 13.4 04/06/2012   HCT 39.7 04/06/2012   MCV 87.4 04/06/2012   PLT 201 04/06/2012      Chemistry      Component Value Date/Time   NA 139 04/06/2012 1211   K 2.9* 04/06/2012 1211   CL 98 04/06/2012 1211   CO2 30 04/06/2012 1211   BUN 19 04/06/2012 1211   CREATININE 0.79 04/06/2012 1211      Component Value Date/Time   CALCIUM 10.2 04/06/2012  1211   ALKPHOS 60 04/06/2012 1211   AST 29 04/06/2012 1211   ALT 29 04/06/2012 1211   BILITOT 0.5 04/06/2012 1211       Lab Results  Component Value Date   LABCA2 26 04/06/2012    No components found with this basename: [id number]    No results found for this basename: INR:1;PROTIME:1 in the last 168 hours  Urinalysis No results found for this basename: colorurine,  appearanceur,  labspec,  phurine,  glucoseu,  hgbur,  bilirubinur,  ketonesur,  proteinur,  urobilinogen,  nitrite,  leukocytesur    STUDIES:  04/05/2012  *RADIOLOGY REPORT*  Clinical Data: New diagnosis right-sided breast cancer  BILATERAL BREAST MRI WITH AND WITHOUT CONTRAST  Technique: Multiplanar, multisequence MR images of both breasts were obtained prior to and following the intravenous administration of 20ml of Multihance.  Three dimensional images were evaluated at the independent DynaCad workstation.  Comparison:  Mammogram dated 03/02/2012  Findings: Foci of nonspecific enhancement seen bilaterally.  A round, heterogeneously enhancing mass  with spiculated margins is seen in the upper-outer quadrant of the right breast, anteriorly measuring 3.8 x 3.6 x 3.5 cm corresponding to the known, biopsy- proven malignancy.  A biopsy clip is seen in association with the mass.  There is overlying skin retraction although, no skin enhancement is seen at this time.  In the lower outer quadrant of the right breast, middle third, there is an enhancing, bilobed mass with circumscribed and irregular margins,  associated high T2 signal measuring 1.0 x 0.8 x 0.5 cm.  This could represent an intramammary lymph node although it remains suspicious considering the irregular margins.  A lymph node with and obliterated fatty hilum and thickened cortex is seen in the right axilla measuring 1.7 x 1.1 x 2.4 cm corresponding to the biopsy-proven metastatic disease.  No other axillary or internal mammary adenopathy is identified. No suspicious enhancement seen in the left breast.  No left axillary or internal mammary adenopathy is present.  IMPRESSION: 1.  Known right-sided breast cancer and metastatic disease, right axilla.  Suspicious mass, lower outer quadrant of the right breast detailed above.  If breast conservation therapy is desired, a second look ultrasound is recommended.  If this is not identified, an MRI guided biopsy is recommended. 2.  No MRI specific evidence of malignancy, left breast.  THREE-DIMENSIONAL MR IMAGE RENDERING ON INDEPENDENT WORKSTATION:  Three-dimensional MR images were rendered by post-processing of the original MR data on an independent workstation.  The three- dimensional MR images were interpreted, and findings were reported in the accompanying complete MRI report for this study.  BI-RADS CATEGORY 6:  Known biopsy-proven malignancy - appropriate action should be taken.  Original Report Authenticated By: Hiram Gash, M.D.    ASSESSMENT: 66 year old [place] woman status post right breast and right axillary lymph node biopsy 03/25/2012,  both showing an invasive ductal carcinoma, clinically T1c N1, grade 2, estrogen and progesterone both 100% positive, with an MIB-1 of 39%, and no HER-2 amplification. There is a second right breast mass in a different quadrant, which has not been biopsied, and which may be an intramammary lymph node. There is an abnormal area in the left breast, biopsied for 16 2013, showing an intraductal papilloma.  PLAN: we spent the better part of her hour-long visit today discussing her situation. She understands that there is no survival advantage to lumpectomy plus radiation as opposed to mastectomy. If she wanted to keep her right breast, we would proceed to biopsy  of the second lesion to document or rule out the presence of multicentric disease. A recommendation was to proceed to additional right breast biopsy and then mastectomy if there is multicentric disease or lumpectomy if not.  However the patient is very anxious about postponing surgery, "I just want to get it over with", and for that reason is leaning towards mastectomy with axillary lymph node dissection on the right and lumpectomy on the left, rather than waiting for another biopsy. She understands she will need postmastectomy radiation as well as adjuvant chemotherapy, which is the standard of care as per NCCN guidelines. (I quoted her a risk of recurrence of 55% with local treatment only, dropping to 24% residual risk if she does standard chemotherapy and hormone therapy). For that reason she will benefit from port placement. Finally, given the uncertain diagnosis in her mother's case, she may benefit from genetics counseling.  She has a return appointment with me in mid May. By then we should have all the relevant data and should be ready to proceed with adjuvant treatment.  ADDENDUM: I confirmed with Dr. Biagio Quint that he discussed reconstruction options with the patient. Because she is likely to receive post-mastectomy irradiation, she is not a  candidate for immediate reconstruction. It was not clear to him that the patient had a clear interest in reconstruction. We will follow-up on this question at her next visit.   Gautam Langhorst C    04/12/2012

## 2012-04-14 ENCOUNTER — Ambulatory Visit: Payer: 59 | Admitting: Lab

## 2012-04-14 ENCOUNTER — Ambulatory Visit (HOSPITAL_BASED_OUTPATIENT_CLINIC_OR_DEPARTMENT_OTHER): Payer: 59 | Admitting: Genetic Counselor

## 2012-04-14 DIAGNOSIS — C50419 Malignant neoplasm of upper-outer quadrant of unspecified female breast: Secondary | ICD-10-CM

## 2012-04-14 DIAGNOSIS — C773 Secondary and unspecified malignant neoplasm of axilla and upper limb lymph nodes: Secondary | ICD-10-CM

## 2012-04-14 DIAGNOSIS — Z808 Family history of malignant neoplasm of other organs or systems: Secondary | ICD-10-CM

## 2012-04-14 DIAGNOSIS — IMO0002 Reserved for concepts with insufficient information to code with codable children: Secondary | ICD-10-CM

## 2012-04-14 NOTE — Progress Notes (Signed)
Dr.  Darnelle Catalan requested a consultation for genetic counseling and risk assessment for Yvonne [name], a 66 y.o. female, for discussion of her personal and family history of cancer. She presents to clinic today, with her daughter Yvonne [name], to discuss the possibility of a genetic predisposition to cancer, and to further clarify her risks, as well as her family members' risks for cancer.   HISTORY OF PRESENT ILLNESS: In April 2013, at the age of 72, Yvonne [name] was diagnosed with ER+, PR+, Her2- invasive ductal carcinoma of the breast. This was treated with chemotherapy, and surgery is scheduled on Apr 26, 2012.    Past Medical History  Diagnosis Date  . Elevated cholesterol     Past Surgical History  Procedure Date  . Appendectomy     History  Substance Use Topics  . Smoking status: Never Smoker   . Smokeless tobacco: Not on file  . Alcohol Use: No    REPRODUCTIVE HISTORY AND PERSONAL RISK ASSESSMENT FACTORS: Menarche was at age 57.   Menopause at age 62 Uterus Intact: Yes Ovaries Intact: Yes G5P5A0 , first live birth at age 54  She has not previously undergone treatment for infertility.   No OCP use   She has not used HRT in the past.    FAMILY HISTORY:  We obtained a detailed, 4-generation family history.  Significant diagnoses are listed below: Family History  Problem Relation Age of Onset  . Cancer Mother     ovarian cancer  . Cancer Sister     colon cancer  . Cancer Brother     colon cancer  . Cancer Brother     prostate cancer  The patient was diagnosed with breast cancer at age 88.  She has a daughter, son, and three grandchildren with hereditary multiple exostosis.  This is not related to breast cancer, but can be associated with a rare bone cancer called chondrosarcoma.  Ms. Beitler had four brothers and two sisters.  One brother was diagnosed with prostate cancer at age 68 and died at 35, a second brother was diagnosed with colon cancer at age 36,  one sister was diagnosed with colon cancer at age 24, and the second sister died of the flu in the 29s.  The patient reports that her mother with uterine cancer at age 25 and died soon afterward.  She was an only child.  There is no other cancer history on this side of the family.  The patient's father was diagnosed with leukemia and died at 71.  There is no other reported cancer history on this side of the family.  Patient's maternal ancestors are of [place] descent, and paternal ancestors are of [place] descent. There is no reported Ashkenazi Jewish ancestry. There is no known consanguinity.  GENETIC COUNSELING RISK ASSESSMENT, DISCUSSION, AND SUGGESTED FOLLOW UP: We reviewed the natural history and genetic etiology of sporadic, familial and hereditary cancer syndromes.  The patient's personal and family history is suggestive of the following possible diagnosis: hereditary cancer syndrome, possible Lynch syndrome  We discussed that identification of a hereditary cancer syndrome may help her care providers tailor the patients medical management. If a mutation indicating a hereditary cancer syndrome is detected in this case, the [employer] recommendations would include increased cancer surveillance or possible prophylactic surgery. If a mutation is detected, the patient will be referred back to the referring provider and to any additional appropriate care providers to discuss the relevant options.   If  a mutation is not found in the patient, this will decrease the likelihood of hereditary cancer syndrome as the explanation for her breast cancer. Cancer surveillance options would be discussed for the patient according to the appropriate standard National Comprehensive Cancer Network and American Cancer Society guidelines, with consideration of their personal and family history risk factors. In this case, the patient will be referred back to their care providers for  discussions of management.   In order to estimate her chance of having an MMR mutation associated with Lynch syndrome, we used statistical models (Premm1,2,6) and laboratory data that take into account her personal medical history, family history and ancestry.  Because each model is different, there can be a lot of variability in the risks they give.  Therefore, these numbers must be considered a rough range and not a precise risk of having a MMR mutation.  These models estimate that she has approximately a 25.8% chance of having a mutation. Based on this assessment of her family and personal history, genetic testing is recommended.  After considering the risks, benefits, and limitations, the patient provided informed consent for  the following  Testing: [facility] through [facility].   Per the patient's request, we will contact her by telephone to discuss these results. A follow up genetic counseling visit will be scheduled if indicated.  The patient was seen for a total of 50 minutes, greater than 50% of which was spent face-to-face counseling.  This plan is being carried out per Dr. Darrall Dears recommendations.  This note will also be sent to the referring provider via the electronic medical record. The patient will be supplied with a summary of this genetic counseling discussion as well as educational information on the discussed hereditary cancer syndromes following the conclusion of their visit.   Patient was discussed with Dr. Drue Second.   _______________________________________________________________________ For Office Staff:  Number of people involved in session: 3 Was an Intern/ student involved with case: not applicable

## 2012-04-18 ENCOUNTER — Encounter (HOSPITAL_COMMUNITY)
Admission: RE | Admit: 2012-04-18 | Discharge: 2012-04-18 | Disposition: A | Discharge status: Home / Self Care | Payer: 59 | Source: Ambulatory Visit | Attending: General Surgery | Admitting: General Surgery

## 2012-04-18 ENCOUNTER — Encounter (HOSPITAL_COMMUNITY): Payer: Self-pay

## 2012-04-18 VITALS — BP 111/69 | HR 70 | Temp 97.3°F | Resp 18 | Ht 66.0 in | Wt 209.0 lb

## 2012-04-18 DIAGNOSIS — C50911 Malignant neoplasm of unspecified site of right female breast: Secondary | ICD-10-CM

## 2012-04-18 HISTORY — DX: Spontaneous ecchymoses: R23.3

## 2012-04-18 HISTORY — DX: Other seasonal allergic rhinitis: J30.2

## 2012-04-18 HISTORY — DX: Phlebitis and thrombophlebitis of unspecified site: I80.9

## 2012-04-18 HISTORY — DX: Other skin changes: R23.8

## 2012-04-18 HISTORY — DX: Unspecified osteoarthritis, unspecified site: M19.90

## 2012-04-18 HISTORY — DX: Dorsalgia, unspecified: M54.9

## 2012-04-18 HISTORY — DX: Headache: R51

## 2012-04-18 HISTORY — DX: Essential (primary) hypertension: I10

## 2012-04-18 HISTORY — DX: Malignant (primary) neoplasm, unspecified: C80.1

## 2012-04-18 HISTORY — DX: Other chronic pain: G89.29

## 2012-04-18 LAB — BASIC METABOLIC PANEL
BUN: 15 mg/dL (ref 6–23)
CO2: 33 mEq/L — ABNORMAL HIGH (ref 19–32)
Chloride: 100 mEq/L (ref 96–112)
Creatinine, Ser: 0.89 mg/dL (ref 0.50–1.10)
GFR calc Af Amer: 77 mL/min — ABNORMAL LOW (ref >90)
Potassium: 3.5 mEq/L (ref 3.5–5.1)

## 2012-04-18 LAB — SURGICAL PCR SCREEN: Staphylococcus aureus: NEGATIVE

## 2012-04-18 LAB — CBC
HCT: 39.9 % (ref 36.0–46.0)
Hemoglobin: 13.4 g/dL (ref 12.0–15.0)
MCHC: 33.6 g/dL (ref 30.0–36.0)
MCV: 88.5 fL (ref 78.0–100.0)
RDW: 13.9 % (ref 11.5–15.5)
WBC: 8.1 10*3/uL (ref 4.0–10.5)

## 2012-04-18 NOTE — Pre-Procedure Instructions (Signed)
21 Yvonne [name]  04/18/2012   Your procedure is scheduled on:  Tues, May 7 @ 12:15 AM  Report to [facility] at 10:15 AM.  Call this number if you have problems the morning of surgery: [phone]   Remember:   Do not eat food:After Midnight.  May have clear liquids: up to 4 Hours before arrival.(until 6:15 am)  Clear liquids include soda, tea, black coffee, apple or grape juice, broth,water  Take these medicines the morning of surgery with A SIP OF WATER: Zyrtec   Do not wear jewelry, make-up or nail polish.  Do not wear lotions, powders, or perfumes.   Do not shave 48 hours prior to surgery.  Do not bring valuables to the hospital.  Contacts, dentures or bridgework may not be worn into surgery.  Leave suitcase in the car. After surgery it may be brought to your room.  For patients admitted to the hospital, checkout time is 11:00 AM the day of discharge.   Patients discharged the day of surgery will not be allowed to drive home.  Special Instructions: CHG Shower Use Special Wash: 1/2 bottle night before surgery and 1/2 bottle morning of surgery.   Please read over the following fact sheets that you were given: Pain Booklet, Coughing and Deep Breathing, MRSA Information and Surgical Site Infection Prevention

## 2012-04-18 NOTE — Progress Notes (Signed)
Pt doesn't have a cardiologist  Pt denies ever having an echo/stress test/heart cath  Medical Md is Dr.Avbuere with [facility]--[phone]

## 2012-04-18 NOTE — Progress Notes (Signed)
CC:   Yvonne [name], M.D. Yvonne Pilot, MD  REFERRING PHYSICIAN:  [name], MD  This is a new patient evaluation through the multidisciplinary breast clinic.  Invasive ductal carcinoma of the right breast, clinically T2 N1 M0 with additional biopsy pending within the right breast.  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  HISTORY OF PRESENT ILLNESS:  The patient is a 66 year old female who stated to me that she felt a possible palpable abnormality within the right upper outer quadrant of the right breast.  She discussed this with her primary care physician and was referred to the breast center.  She underwent mammography on 03/02/2012 and a possible mass was noted within the right breast.  She was recalled for diagnostic views and these showed a dense spiculated mass with overlying skin retraction within the upper outer quadrant of the right breast.  Some calcifications were associated with this.  On ultrasound an irregular hypoechoic mass was seen measuring 3.3 cm.  An abnormal lymph node was also noted within the right axilla measuring 1.8 cm.  This had a thickened cortex.  On imaging an irregular mass was also present with lobulated margins within the left breast measuring 2.3 cm.  The left axilla was negative clinically and on imaging.  A biopsy has been performed of the right breast and right axilla and both of these showed an invasive ductal carcinoma.  Receptor studies have indicated that the tumor is ER positive and PR positive, as well as HER-2/neu negative.  The proliferative index was 39%.  The patient has proceeded with an MRI scan of the breast bilaterally.  This study showed a 3.8 x 3.6 x 3.5 cm spiculated lesion within the upper outer quadrant of the right breast with some overlying skin retraction.  No skin enhancement was seen.  Within the lower outer quadrant of the right breast  there was an enhancing bilobed mass measuring 1.0 cm.  This was felt to possibly represent an intramammary lymph node.  This remained suspicious considering the irregular margins of this mass.  A lymph node was seen within obliterated fatty hilum and thickened cortex within the right axilla measuring 2.4 cm and this corresponded to the biopsy proven carcinoma.  No other axillary or internal mammary adenopathy was identified.  There was no suspicious enhancement seen within the left breast.  The patient does have a biopsy pending of the second area on the right.  The patient, therefore, has been seen today in multidisciplinary breast clinic for evaluation and discussion of her possible treatment options including possible radiotherapy.  PAST MEDICAL HISTORY:  Hypercholesterolemia, status post appendectomy.  CURRENT MEDICATIONS:  Aspirin, Celebrex, Zyrtec, Veramyst, potassium, Crestor, Maxzide.  ALLERGIES:  Penicillins.  FAMILY HISTORY:  The patient's mother had ovarian cancer.  She has a brother and a sister with colon cancer and a brother with prostate cancer.  She also states her father had leukemia.  SOCIAL HISTORY:  The patient works at [employer].  She is married. She has no history of smoking.  The patient does not drink alcohol.  REVIEW OF SYSTEMS:  Fully reviewed and negative other than as above.  PHYSICAL EXAM:  Weight 211 pounds, blood pressure 142/78, pulse 62, respiratory rate 20, temperature 97.9.  General:  A well-developed female in no acute distress.  Alert and oriented x3.  HEENT: Normocephalic, atraumatic.  Pupils equal, round and reactive to light. Extraocular movements intact.  Oral cavity  clear.  Neck:  Supple without any lymphadenopathy.  Cardiovascular:  Regular rate and rhythm. Respiratory:  Clear to auscultation.  Breasts:  There was a palpable abnormality within the upper outer quadrant of the right breast.  No other palpable abnormalities on my exam  throughout the breast. Lymphadenopathy present within the right axilla.  There was some bruising in the lower outer quadrant of the left breast which was tender with some post biopsy change at this location.  Negative axilla on the left.  GI:  Abdomen is soft, nontender.  Normal bowel sounds. Extremities:  No edema present.  Neurological:  No focal deficits.  IMPRESSION AND PLAN:  Yvonne [name] is a pleasant 66 year old female who still does have some workup pending.  She did have a biopsy on the left and this was benign although excision of this area is recommended and Dr. Biagio Quint intends to proceed with this.  The patient has clinically at this time T2 N1 disease on the right corresponding to an invasive ductal carcinoma.  Further biopsy is pending for a second area which potentially may represent an intramammary lymph node versus second primary.  The patient has been seen and surgical options have been discussed, although again the workup is not complete.  The patient, it is my understanding is considering mastectomy.  I would recommend in her case consideration of post mastectomy radiation given the node positive disease and of course we will see if there is multifocal disease or an additional lymph node present.  The patient has been recommended to proceed with systemic treatment as well through Dr. Darnelle Catalan.  I therefore discussed with the patient a possible 6-1/2 week course of treatment, which would consist of daily radiation to the right chest/breast region in addition to the high risk regional nodes.  We discussed the possible benefit in terms of local and regional control as well as the possible side effects and risks of treatment as well.  All of her questions were answered.  We will therefore review her case after surgery to make final decisions and I look forward to seeing her back postoperatively to further discuss and coordinate an anticipated course of radiation  treatment.  She is aware of the timing of such a treatment in relation to both surgery and chemotherapy.    ______________________________ Radene Gunning, M.D., Ph.D. JSM/MEDQ  D:  04/18/2012  T:  04/18/2012  Job:  [id number]

## 2012-04-19 ENCOUNTER — Encounter: Payer: Self-pay | Admitting: *Deleted

## 2012-04-19 ENCOUNTER — Other Ambulatory Visit: Payer: 59

## 2012-04-19 NOTE — Progress Notes (Signed)
[facility] Psychosocial Distress Screening Clinical Social Work  Pt completed distress screening protocol.  The patient scored a 5 on the Psychosocial Distress Thermometer which indicates mild distress. Support team member met with pt in [facility] to assess for distress and other psychosocial needs.  Pt was informed of supportive services at [facility] and appropriate interventions where provided.  Pt was encouraged to call with any questions of concerns.      Tamala Julian, MSW, LCSW Clinical Social Worker [facility] [phone]

## 2012-04-20 ENCOUNTER — Other Ambulatory Visit: Payer: Self-pay | Admitting: Oncology

## 2012-04-20 DIAGNOSIS — C50919 Malignant neoplasm of unspecified site of unspecified female breast: Secondary | ICD-10-CM

## 2012-04-20 HISTORY — DX: Malignant neoplasm of unspecified site of unspecified female breast: C50.919

## 2012-04-25 ENCOUNTER — Other Ambulatory Visit: Payer: 59

## 2012-04-25 MED ORDER — HEPARIN SODIUM (PORCINE) 5000 UNIT/ML IJ SOLN
5000.0000 [IU] | Freq: Once | INTRAMUSCULAR | Status: AC
  Administered 2012-04-26: 5000 [IU] via SUBCUTANEOUS
  Filled 2012-04-25: qty 1

## 2012-04-26 ENCOUNTER — Inpatient Hospital Stay (HOSPITAL_COMMUNITY)
Admission: RE | Admit: 2012-04-26 | Discharge: 2012-04-28 | DRG: 581 | Disposition: A | Discharge status: Home / Self Care | Payer: 59 | Source: Ambulatory Visit | Attending: General Surgery | Admitting: General Surgery

## 2012-04-26 ENCOUNTER — Telehealth: Payer: Self-pay | Admitting: Genetic Counselor

## 2012-04-26 ENCOUNTER — Ambulatory Visit
Admission: RE | Admit: 2012-04-26 | Discharge: 2012-04-26 | Disposition: A | Discharge status: Home / Self Care | Payer: 59 | Source: Ambulatory Visit | Attending: General Surgery | Admitting: General Surgery

## 2012-04-26 ENCOUNTER — Ambulatory Visit (HOSPITAL_COMMUNITY): Payer: 59

## 2012-04-26 ENCOUNTER — Encounter (HOSPITAL_COMMUNITY)
Admission: RE | Disposition: A | Discharge status: Home / Self Care | Payer: Self-pay | Source: Ambulatory Visit | Attending: General Surgery

## 2012-04-26 ENCOUNTER — Inpatient Hospital Stay (HOSPITAL_COMMUNITY): Payer: 59

## 2012-04-26 ENCOUNTER — Ambulatory Visit (HOSPITAL_COMMUNITY): Payer: 59 | Admitting: Certified Registered"

## 2012-04-26 ENCOUNTER — Encounter (HOSPITAL_COMMUNITY): Payer: Self-pay | Admitting: Certified Registered"

## 2012-04-26 DIAGNOSIS — Z79899 Other long term (current) drug therapy: Secondary | ICD-10-CM

## 2012-04-26 DIAGNOSIS — C50911 Malignant neoplasm of unspecified site of right female breast: Secondary | ICD-10-CM

## 2012-04-26 DIAGNOSIS — Z7982 Long term (current) use of aspirin: Secondary | ICD-10-CM

## 2012-04-26 DIAGNOSIS — Z880 Allergy status to penicillin: Secondary | ICD-10-CM

## 2012-04-26 DIAGNOSIS — D249 Benign neoplasm of unspecified breast: Secondary | ICD-10-CM

## 2012-04-26 DIAGNOSIS — D0590 Unspecified type of carcinoma in situ of unspecified breast: Secondary | ICD-10-CM

## 2012-04-26 DIAGNOSIS — N649 Disorder of breast, unspecified: Secondary | ICD-10-CM | POA: Diagnosis present

## 2012-04-26 DIAGNOSIS — Z9089 Acquired absence of other organs: Secondary | ICD-10-CM

## 2012-04-26 DIAGNOSIS — N6019 Diffuse cystic mastopathy of unspecified breast: Secondary | ICD-10-CM

## 2012-04-26 DIAGNOSIS — C50919 Malignant neoplasm of unspecified site of unspecified female breast: Principal | ICD-10-CM | POA: Diagnosis present

## 2012-04-26 HISTORY — PX: PORTACATH PLACEMENT: SHX2246

## 2012-04-26 SURGERY — MASTECTOMY, MODIFIED RADICAL
Anesthesia: General | Site: Chest | Laterality: Right

## 2012-04-26 MED ORDER — 0.9 % SODIUM CHLORIDE (POUR BTL) OPTIME
TOPICAL | Status: DC | PRN
  Administered 2012-04-26: 1000 mL

## 2012-04-26 MED ORDER — LIDOCAINE HCL 1 % IJ SOLN
INTRAMUSCULAR | Status: DC | PRN
  Administered 2012-04-26: 50 mg via INTRADERMAL

## 2012-04-26 MED ORDER — CLINDAMYCIN PHOSPHATE 900 MG/50ML IV SOLN
900.0000 mg | Freq: Once | INTRAVENOUS | Status: AC
  Administered 2012-04-26: 900 mg via INTRAVENOUS
  Filled 2012-04-26: qty 50

## 2012-04-26 MED ORDER — CELECOXIB 200 MG PO CAPS
200.0000 mg | ORAL_CAPSULE | Freq: Every day | ORAL | Status: DC
  Administered 2012-04-27: 200 mg via ORAL
  Filled 2012-04-26 (×3): qty 1

## 2012-04-26 MED ORDER — VANCOMYCIN HCL 1000 MG IV SOLR
1000.0000 mg | INTRAVENOUS | Status: DC | PRN
  Administered 2012-04-26: 1000 mg via INTRAVENOUS

## 2012-04-26 MED ORDER — LACTATED RINGERS IV SOLN
INTRAVENOUS | Status: DC
  Administered 2012-04-26: 12:00:00 via INTRAVENOUS

## 2012-04-26 MED ORDER — FLUTICASONE PROPIONATE 50 MCG/ACT NA SUSP
2.0000 | Freq: Every day | NASAL | Status: DC
  Administered 2012-04-27: 2 via NASAL
  Filled 2012-04-26: qty 16

## 2012-04-26 MED ORDER — VANCOMYCIN HCL IN DEXTROSE 1-5 GM/200ML-% IV SOLN
INTRAVENOUS | Status: AC
  Filled 2012-04-26: qty 200

## 2012-04-26 MED ORDER — FLUTICASONE FUROATE 27.5 MCG/SPRAY NA SUSP: 2.0000 | Freq: Every day | NASAL | Status: DC

## 2012-04-26 MED ORDER — LACTATED RINGERS IV SOLN
INTRAVENOUS | Status: DC | PRN
  Administered 2012-04-26 (×3): via INTRAVENOUS

## 2012-04-26 MED ORDER — HYDROMORPHONE HCL PF 1 MG/ML IJ SOLN
0.2500 mg | INTRAMUSCULAR | Status: DC | PRN
  Administered 2012-04-26 (×2): 0.5 mg via INTRAVENOUS

## 2012-04-26 MED ORDER — ONDANSETRON HCL 4 MG/2ML IJ SOLN: 4.0000 mg | Freq: Four times a day (QID) | INTRAMUSCULAR | Status: DC | PRN

## 2012-04-26 MED ORDER — NEOSTIGMINE METHYLSULFATE 1 MG/ML IJ SOLN
INTRAMUSCULAR | Status: DC | PRN
  Administered 2012-04-26: 4 mg via INTRAVENOUS

## 2012-04-26 MED ORDER — ONDANSETRON HCL 4 MG/2ML IJ SOLN
INTRAMUSCULAR | Status: DC | PRN
  Administered 2012-04-26: 4 mg via INTRAVENOUS

## 2012-04-26 MED ORDER — MIDAZOLAM HCL 2 MG/2ML IJ SOLN
INTRAMUSCULAR | Status: AC
  Filled 2012-04-26: qty 2

## 2012-04-26 MED ORDER — DROPERIDOL 2.5 MG/ML IJ SOLN
INTRAMUSCULAR | Status: DC | PRN
  Administered 2012-04-26: 0.625 mg via INTRAVENOUS

## 2012-04-26 MED ORDER — GLYCOPYRROLATE 0.2 MG/ML IJ SOLN
INTRAMUSCULAR | Status: DC | PRN
  Administered 2012-04-26: .5 mg via INTRAVENOUS

## 2012-04-26 MED ORDER — ROCURONIUM BROMIDE 100 MG/10ML IV SOLN
INTRAVENOUS | Status: DC | PRN
  Administered 2012-04-26: 50 mg via INTRAVENOUS
  Administered 2012-04-26: 20 mg via INTRAVENOUS

## 2012-04-26 MED ORDER — ONDANSETRON HCL 4 MG/2ML IJ SOLN: 4.0000 mg | Freq: Once | INTRAMUSCULAR | Status: DC | PRN

## 2012-04-26 MED ORDER — ONDANSETRON HCL 4 MG PO TABS: 4.0000 mg | ORAL_TABLET | Freq: Four times a day (QID) | ORAL | Status: DC | PRN

## 2012-04-26 MED ORDER — HYDROCODONE-ACETAMINOPHEN 5-325 MG PO TABS
1.0000 | ORAL_TABLET | ORAL | Status: DC | PRN
  Administered 2012-04-27 – 2012-04-28 (×4): 2 via ORAL
  Filled 2012-04-26 (×4): qty 2

## 2012-04-26 MED ORDER — WHITE PETROLATUM GEL
Status: AC
  Administered 2012-04-26: 21:00:00
  Filled 2012-04-26: qty 5

## 2012-04-26 MED ORDER — KCL IN DEXTROSE-NACL 20-5-0.45 MEQ/L-%-% IV SOLN
INTRAVENOUS | Status: DC
  Administered 2012-04-26: 22:00:00 via INTRAVENOUS
  Filled 2012-04-26 (×3): qty 1000

## 2012-04-26 MED ORDER — LIDOCAINE-EPINEPHRINE (PF) 1 %-1:200000 IJ SOLN
INTRAMUSCULAR | Status: DC | PRN
  Administered 2012-04-26: 14:00:00

## 2012-04-26 MED ORDER — TRIAMTERENE-HCTZ 37.5-25 MG PO TABS
1.0000 | ORAL_TABLET | Freq: Every day | ORAL | Status: DC
  Administered 2012-04-27: 1 via ORAL
  Filled 2012-04-26 (×3): qty 1

## 2012-04-26 MED ORDER — HEPARIN SOD (PORK) LOCK FLUSH 100 UNIT/ML IV SOLN
INTRAVENOUS | Status: DC | PRN
  Administered 2012-04-26: 500 [IU] via INTRAVENOUS

## 2012-04-26 MED ORDER — PROPOFOL 10 MG/ML IV EMUL
INTRAVENOUS | Status: DC | PRN
  Administered 2012-04-26: 150 mg via INTRAVENOUS

## 2012-04-26 MED ORDER — FENTANYL CITRATE 0.05 MG/ML IJ SOLN
INTRAMUSCULAR | Status: AC
  Filled 2012-04-26: qty 2

## 2012-04-26 MED ORDER — MORPHINE SULFATE 2 MG/ML IJ SOLN
1.0000 mg | INTRAMUSCULAR | Status: DC | PRN
  Administered 2012-04-27 (×3): 2 mg via INTRAVENOUS
  Filled 2012-04-26 (×3): qty 1

## 2012-04-26 MED ORDER — POTASSIUM CHLORIDE CRYS ER 20 MEQ PO TBCR
20.0000 meq | EXTENDED_RELEASE_TABLET | Freq: Every day | ORAL | Status: DC
  Filled 2012-04-26 (×3): qty 1

## 2012-04-26 MED ORDER — LIDOCAINE HCL 4 % MT SOLN
OROMUCOSAL | Status: DC | PRN
  Administered 2012-04-26: 4 mL via TOPICAL

## 2012-04-26 MED ORDER — FENTANYL CITRATE 0.05 MG/ML IJ SOLN
INTRAMUSCULAR | Status: DC | PRN
  Administered 2012-04-26: 50 ug via INTRAVENOUS
  Administered 2012-04-26 (×3): 100 ug via INTRAVENOUS
  Administered 2012-04-26: 50 ug via INTRAVENOUS
  Administered 2012-04-26: 100 ug via INTRAVENOUS

## 2012-04-26 MED ORDER — ENOXAPARIN SODIUM 40 MG/0.4ML ~~LOC~~ SOLN
40.0000 mg | SUBCUTANEOUS | Status: DC
  Administered 2012-04-27: 40 mg via SUBCUTANEOUS
  Filled 2012-04-26 (×2): qty 0.4

## 2012-04-26 MED ORDER — MIDAZOLAM HCL 5 MG/5ML IJ SOLN
INTRAMUSCULAR | Status: DC | PRN
  Administered 2012-04-26: 2 mg via INTRAVENOUS

## 2012-04-26 MED ORDER — SODIUM CHLORIDE 0.9 % IR SOLN
Status: DC | PRN
  Administered 2012-04-26: 14:00:00

## 2012-04-26 SURGICAL SUPPLY — 102 items
APPLIER CLIP 9.375 MED OPEN (MISCELLANEOUS) ×5
APPLIER CLIP 9.375 SM OPEN (CLIP) ×5
BAG DECANTER FOR FLEXI CONT (MISCELLANEOUS) ×5 IMPLANT
BENZOIN TINCTURE PRP APPL 2/3 (GAUZE/BANDAGES/DRESSINGS) ×10 IMPLANT
BINDER BREAST LRG (GAUZE/BANDAGES/DRESSINGS) IMPLANT
BINDER BREAST XLRG (GAUZE/BANDAGES/DRESSINGS) ×5 IMPLANT
BLADE SURG 11 STRL SS (BLADE) ×5 IMPLANT
BLADE SURG 15 STRL LF DISP TIS (BLADE) ×12 IMPLANT
BLADE SURG 15 STRL SS (BLADE) ×3
BNDG COHESIVE 4X5 TAN STRL (GAUZE/BANDAGES/DRESSINGS) ×5 IMPLANT
CANISTER SUCTION 2500CC (MISCELLANEOUS) ×5 IMPLANT
CATH ROBINSON RED A/P 14FR (CATHETERS) ×5 IMPLANT
CHLORAPREP W/TINT 10.5 ML (MISCELLANEOUS) ×5 IMPLANT
CHLORAPREP W/TINT 26ML (MISCELLANEOUS) ×10 IMPLANT
CLIP APPLIE 9.375 MED OPEN (MISCELLANEOUS) ×4 IMPLANT
CLIP APPLIE 9.375 SM OPEN (CLIP) ×4 IMPLANT
CLIP TI WIDE RED SMALL 6 (CLIP) ×5 IMPLANT
CLOTH BEACON ORANGE TIMEOUT ST (SAFETY) ×5 IMPLANT
CLSR STERI-STRIP ANTIMIC 1/2X4 (GAUZE/BANDAGES/DRESSINGS) ×10 IMPLANT
CONT SPEC 4OZ CLIKSEAL STRL BL (MISCELLANEOUS) ×5 IMPLANT
COVER MAYO STAND STRL (DRAPES) ×5 IMPLANT
COVER PROBE W GEL 5X96 (DRAPES) ×5 IMPLANT
COVER SURGICAL LIGHT HANDLE (MISCELLANEOUS) ×10 IMPLANT
CRADLE DONUT ADULT HEAD (MISCELLANEOUS) ×5 IMPLANT
DECANTER SPIKE VIAL GLASS SM (MISCELLANEOUS) ×10 IMPLANT
DERMABOND ADVANCED (GAUZE/BANDAGES/DRESSINGS) ×1
DERMABOND ADVANCED .7 DNX12 (GAUZE/BANDAGES/DRESSINGS) ×4 IMPLANT
DEVICE DUBIN SPECIMEN MAMMOGRA (MISCELLANEOUS) ×5 IMPLANT
DRAIN CHANNEL 19F RND (DRAIN) ×10 IMPLANT
DRAPE C-ARM 42X72 X-RAY (DRAPES) ×10 IMPLANT
DRAPE LAPAROSCOPIC ABDOMINAL (DRAPES) ×10 IMPLANT
DRAPE PED LAPAROTOMY (DRAPES) ×10 IMPLANT
DRAPE UTILITY 15X26 W/TAPE STR (DRAPE) ×20 IMPLANT
ELECT BLADE 4.0 EZ CLEAN MEGAD (MISCELLANEOUS) ×5
ELECT CAUTERY BLADE 6.4 (BLADE) ×10 IMPLANT
ELECT COATED BLADE 2.86 ST (ELECTRODE) ×10 IMPLANT
ELECT REM PT RETURN 9FT ADLT (ELECTROSURGICAL) ×5
ELECTRODE BLDE 4.0 EZ CLN MEGD (MISCELLANEOUS) ×4 IMPLANT
ELECTRODE REM PT RTRN 9FT ADLT (ELECTROSURGICAL) ×4 IMPLANT
EVACUATOR SILICONE 100CC (DRAIN) ×10 IMPLANT
GAUZE SPONGE 4X4 16PLY XRAY LF (GAUZE/BANDAGES/DRESSINGS) ×5 IMPLANT
GEL ULTRASOUND 20GR AQUASONIC (MISCELLANEOUS) IMPLANT
GLOVE BIO SURGEON STRL SZ 6 (GLOVE) ×5 IMPLANT
GLOVE BIO SURGEON STRL SZ7 (GLOVE) ×5 IMPLANT
GLOVE BIO SURGEON STRL SZ7.5 (GLOVE) ×5 IMPLANT
GLOVE BIO SURGEON STRL SZ8 (GLOVE) ×5 IMPLANT
GLOVE BIOGEL PI IND STRL 6.5 (GLOVE) ×8 IMPLANT
GLOVE BIOGEL PI IND STRL 7.0 (GLOVE) ×4 IMPLANT
GLOVE BIOGEL PI IND STRL 7.5 (GLOVE) ×8 IMPLANT
GLOVE BIOGEL PI IND STRL 8 (GLOVE) ×8 IMPLANT
GLOVE BIOGEL PI INDICATOR 6.5 (GLOVE) ×2
GLOVE BIOGEL PI INDICATOR 7.0 (GLOVE) ×1
GLOVE BIOGEL PI INDICATOR 7.5 (GLOVE) ×2
GLOVE BIOGEL PI INDICATOR 8 (GLOVE) ×2
GLOVE ECLIPSE 7.5 STRL STRAW (GLOVE) ×5 IMPLANT
GLOVE SURG SS PI 7.5 STRL IVOR (GLOVE) ×35 IMPLANT
GOWN PREVENTION PLUS XLARGE (GOWN DISPOSABLE) ×25 IMPLANT
GOWN PREVENTION PLUS XXLARGE (GOWN DISPOSABLE) ×5 IMPLANT
GOWN STRL NON-REIN LRG LVL3 (GOWN DISPOSABLE) ×15 IMPLANT
INTRODUCER COOK 11FR (CATHETERS) IMPLANT
KIT BASIN OR (CUSTOM PROCEDURE TRAY) ×10 IMPLANT
KIT PORT POWER 9.6FR MRI PREA (Catheter) IMPLANT
KIT PORT POWER ISP 8FR (Catheter) IMPLANT
KIT POWER CATH 8FR (Catheter) ×5 IMPLANT
KIT ROOM TURNOVER OR (KITS) ×5 IMPLANT
NEEDLE 18GX1X1/2 (RX/OR ONLY) (NEEDLE) ×5 IMPLANT
NEEDLE HYPO 25GX1X1/2 BEV (NEEDLE) ×10 IMPLANT
NS IRRIG 1000ML POUR BTL (IV SOLUTION) ×5 IMPLANT
PACK GENERAL/GYN (CUSTOM PROCEDURE TRAY) ×5 IMPLANT
PACK SURGICAL SETUP 50X90 (CUSTOM PROCEDURE TRAY) ×5 IMPLANT
PAD ARMBOARD 7.5X6 YLW CONV (MISCELLANEOUS) ×10 IMPLANT
PENCIL BUTTON HOLSTER BLD 10FT (ELECTRODE) ×5 IMPLANT
SET INTRODUCER 12FR PACEMAKER (SHEATH) IMPLANT
SET SHEATH INTRODUCER 10FR (MISCELLANEOUS) IMPLANT
SHEATH COOK PEEL AWAY SET 9F (SHEATH) IMPLANT
SPECIMEN JAR X LARGE (MISCELLANEOUS) ×5 IMPLANT
SPONGE GAUZE 4X4 12PLY (GAUZE/BANDAGES/DRESSINGS) ×10 IMPLANT
SPONGE INTESTINAL PEANUT (DISPOSABLE) ×10 IMPLANT
SPONGE LAP 18X18 X RAY DECT (DISPOSABLE) ×5 IMPLANT
STAPLER VISISTAT 35W (STAPLE) ×5 IMPLANT
STOCKINETTE IMPERVIOUS 9X36 MD (GAUZE/BANDAGES/DRESSINGS) ×5 IMPLANT
STRIP CLOSURE SKIN 1/2X4 (GAUZE/BANDAGES/DRESSINGS) ×5 IMPLANT
SUT ETHILON 3 0 FSL (SUTURE) ×10 IMPLANT
SUT MNCRL AB 4-0 PS2 18 (SUTURE) ×15 IMPLANT
SUT MON AB 4-0 PC3 18 (SUTURE) ×5 IMPLANT
SUT PROLENE 2 0 SH DA (SUTURE) ×10 IMPLANT
SUT SILK 2 0 FS (SUTURE) ×5 IMPLANT
SUT SILK 2 0 SH (SUTURE) ×5 IMPLANT
SUT VIC AB 3-0 54X BRD REEL (SUTURE) ×4 IMPLANT
SUT VIC AB 3-0 BRD 54 (SUTURE) ×1
SUT VIC AB 3-0 SH 18 (SUTURE) ×5 IMPLANT
SUT VIC AB 3-0 SH 27 (SUTURE) ×1
SUT VIC AB 3-0 SH 27X BRD (SUTURE) ×4 IMPLANT
SUT VIC AB 3-0 SH 8-18 (SUTURE) ×20 IMPLANT
SYR 20CC LL (SYRINGE) ×10 IMPLANT
SYR 5ML LL (SYRINGE) ×5 IMPLANT
SYR CONTROL 10ML LL (SYRINGE) ×5 IMPLANT
TAPE CLOTH SURG 4X10 WHT LF (GAUZE/BANDAGES/DRESSINGS) ×10 IMPLANT
TOWEL OR 17X24 6PK STRL BLUE (TOWEL DISPOSABLE) ×5 IMPLANT
TOWEL OR 17X26 10 PK STRL BLUE (TOWEL DISPOSABLE) ×5 IMPLANT
WATER STERILE IRR 1000ML POUR (IV SOLUTION) IMPLANT
YANKAUER SUCT BULB TIP NO VENT (SUCTIONS) ×5 IMPLANT

## 2012-04-26 NOTE — Anesthesia Procedure Notes (Signed)
Procedure Name: Intubation Date/Time: 04/26/2012 1:00 PM Performed by: Trinia Georgi S Pre-anesthesia Checklist: Patient identified, Emergency Drugs available, Suction available, Patient being monitored and Timeout performed Patient Re-evaluated:Patient Re-evaluated prior to inductionOxygen Delivery Method: Circle system utilized Preoxygenation: Pre-oxygenation with 100% oxygen Intubation Type: IV induction Ventilation: Mask ventilation without difficulty Laryngoscope Size: Mac and 3 Grade View: Grade I Tube type: Oral Tube size: 7.5 mm Number of attempts: 1 Airway Equipment and Method: Stylet Placement Confirmation: ETT inserted through vocal cords under direct vision,  positive ETCO2 and breath sounds checked- equal and bilateral Secured at: 21 cm Tube secured with: Tape Dental Injury: Teeth and Oropharynx as per pre-operative assessment

## 2012-04-26 NOTE — Anesthesia Preprocedure Evaluation (Addendum)
Anesthesia Evaluation  Patient identified by MRN, date of birth, ID band Patient awake    Reviewed: Allergy & Precautions, H&P , NPO status , Patient's Chart, lab work & pertinent test results  History of Anesthesia Complications Negative for: history of anesthetic complications  Airway Mallampati: II TM Distance: >3 FB Neck ROM: Full    Dental  (+) Poor Dentition, Loose, Chipped, Missing and Dental Advisory Given,    Pulmonary  Seasonal allergies breath sounds clear to auscultation        Cardiovascular hypertension, Pt. on medications Rhythm:Regular Rate:Normal  Denies CP/SOB Last antihypertensive 04/25/12 11am   Neuro/Psych  Headaches, negative psych ROS   GI/Hepatic Neg liver ROS, GERD-  Controlled,  Endo/Other  Morbid obesity  Renal/GU negative Renal ROS     Musculoskeletal  (+) Arthritis -, Osteoarthritis,    Abdominal (+) + obese,   Peds  Hematology H/0 phlebitis    Anesthesia Other Findings   Reproductive/Obstetrics negative OB ROS                          Anesthesia Physical Anesthesia Plan  ASA: II  Anesthesia Plan: General   Post-op Pain Management:    Induction:   Airway Management Planned:   Additional Equipment:   Intra-op Plan:   Post-operative Plan:   Informed Consent:   Plan Discussed with:   Anesthesia Plan Comments:         Anesthesia Quick Evaluation

## 2012-04-26 NOTE — H&P (View-Only) (Signed)
Patient ID: Yvonne [name], female   DOB: 04/29/1946, 66 y.o.   MRN: [id number]  No chief complaint on file.   HPI Yvonne [name] is a 66 y.o. female.  This patient is seen today in our multidisciplinary breast clinic for a newly diagnosed right breast cancer. She says that she first noticed a lump found on self breast exam and presented to her physician and followup mammogram ultrasound and MRI were ordered. She says that she does her self breast exams routinely and has not noticed anything previously. She did not have any significant family history for breast cancer and denies any systemic symptoms such as headaches, bony pains, or weight loss. 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 The lesion measured approximately 3.8 cm x 3.6 cm in size. She also had another nodule in another location in the right breast and she has a biopsy scheduled for this next week. 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 HPI  Past Medical History  Diagnosis Date  . Elevated cholesterol     Past Surgical History  Procedure Date  . Appendectomy     Family History  Problem Relation Age of Onset  . Cancer Mother     ovarian cancer  . Cancer Sister     colon cancer  . Cancer Brother     colon cancer  . Cancer Brother     prostate cancer    Social History History  Substance Use Topics  . Smoking status: Never Smoker   . Smokeless tobacco: Not on file  . Alcohol Use: No    Allergies  Allergen Reactions  . Penicillins     Current Outpatient Prescriptions  Medication Sig Dispense Refill  . aspirin 81 MG tablet Take 81 mg by  mouth daily.      . Calcium Carbonate-Vitamin D (CALCIUM + D PO) Take 1 tablet by mouth daily.      . celecoxib (CELEBREX) 200 MG capsule Take 200 mg by mouth daily.      . cetirizine (ZYRTEC) 10 MG chewable tablet Chew 10 mg by mouth daily.      . fluticasone (VERAMYST) 27.5 MCG/SPRAY nasal spray Place 2 sprays into the nose daily.      . Multiple Vitamins-Minerals (CERTAVITE SENIOR/ANTIOXIDANT) TABS Take 1 tablet by mouth.      . rosuvastatin (CRESTOR) 20 MG tablet Take 20 mg by mouth daily.      . triamterene-hydrochlorothiazide (MAXZIDE-25) 37.5-25 MG per tablet Take 1 tablet by mouth daily.        Review of Systems Review of Systems All other review of systems negative or noncontributory except as stated in the HPI  There were no vitals taken for this visit.  Physical Exam Physical Exam Physical Exam  Nursing note and vitals reviewed. Constitutional: She is oriented to person, place, and time. She appears well-developed and well-nourished. No distress.  HENT:  Head: Normocephalic and atraumatic.  Mouth/Throat: No oropharyngeal exudate.  Eyes: Conjunctivae and EOM are normal. Pupils are equal, round, and reactive to light. Right eye exhibits no discharge. Left eye exhibits no discharge. No scleral icterus.  Neck: Normal range of motion. Neck supple.   No tracheal deviation present.  Cardiovascular: Normal rate, regular rhythm, normal heart sounds and intact distal pulses.   Pulmonary/Chest: Effort normal and breath sounds normal. No stridor. No respiratory distress. She has no wheezes.  Abdominal: Soft. Bowel sounds are normal. She exhibits no distension and no mass. There is no tenderness. There is no rebound and no guarding.  Musculoskeletal: Normal range of motion. She exhibits no edema and no tenderness.  Neurological: She is alert and oriented to person, place, and time.  Skin: Skin is warm and dry. No rash noted. She is not diaphoretic. No erythema. No pallor.  Psychiatric:  She has a normal mood and affect. Her behavior is normal. Judgment and thought content normal.  Breast: her right breast shows some dimpling in the upper outer quadrant of the breast with a palpable mass measuring approximately 4-5 cm in diameter consistent with the known carcinoma. Did not appreciate any other palpable or suspicious masses in the right breast and I do not feel any axillary lymphadenopathy on exam. 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 She has already met with the radiation oncologist and the medical oncologist as well to discuss adjuvant therapies. She is most likely going to require chemotherapy and radiation for the axilla as well. We discussed the risks and benefits of breast conservation therapy versus MRM and she would like to proceed with modified radical mastectomy on the right with left-sided Mediport placement and left-sided needle localized lumpectomy. She expressed understanding of the possible need for further surgery on the left side if her biopsy comes back positive. We also discussed the risks of the Mediport including infection, bleeding, pain, scarring, port malfunction, need for open surgery, pneumothorax or hemothorax, and arrhythmia. We also discussed the risks of modified radical mastectomy including infection, bleeding, pain, scarring, recurrent tumor, nerve injury, lymphedema and the need for possible repeat surgery and she expressed understanding and would like to proceed with right-sided modified radical mastectomy  with left-sided Mediport placement and left-sided needle localized lumpectomy. Because she is interested in modified radical mastectomy I do not think that we need to proceed with the right-sided biopsy of the additional lesion as this will be excised with the mastectomy. 45 min. Was spent counseling this patient.    Plan    We will schedule this as soon as possible.       Taysen Bushart DAVID 04/06/2012, 4:29 PM    

## 2012-04-26 NOTE — Op Note (Signed)
NAMEDONNITA, Yvonne [name] NO.:  [id number]  MEDICAL RECORD NO.:  [id number]  LOCATION:  5121                         FACILITY:  [facility]  PHYSICIAN:  [name], MD       DATE OF BIRTH:  Sep 17, 1946  DATE OF PROCEDURE:  04/26/2012 DATE OF DISCHARGE:                              OPERATIVE REPORT   PROCEDURE:  Right modified radical mastectomy with left breast needle localized partial mastectomy and ultrasound-guided left subclavian MediPort placement.  PREOPERATIVE DIAGNOSIS:  Right breast cancer.  POSTOPERATIVE DIAGNOSIS:  Right breast cancer.  SURGEON:  [name], MD  ASSISTANT: 1. Anselm Pancoast. Zachery Dakins, M.D. 2. Dr. Janee Morn.  ANESTHESIA:  General endotracheal anesthesia with 30 mL of 1% lidocaine with epinephrine and 0.25% Marcaine in a 50:50 mixture.  FLUIDS:  2500 mL of crystalloid.  ESTIMATED BLOOD LOSS:  20 mL.  DRAINS:  A 19-French Blake drain x2 placed in the right axilla and another right mastectomy flap.  The lateral drain is the axillary drain. The medial drain is the mastectomy drain.  SPECIMENS: 1. Right modified radical mastectomy with the suture marking in the     medial portion of the breast. 2. Left partial mastectomy with the short stitch marking the superior     margin and the long stitch marking the lateral margin.  COMPLICATIONS:  None apparent.  FINDINGS:  Pathologically normally enlarged right axillary lymph node. Nonvisualization of the thoracodorsal nerve.  The long thoracic nerve was identified.  The left lumpectomy specimen has a short stitch marking the superior margin, a long stitch marking lateral margin, and confirmation of removal of the prior biopsy clip by Dr. Deboraha Sprang. Placement of 8-French PowerPort in the left subclavian vein.  INDICATION FOR PROCEDURE:  Yvonne [name] is a 66 year old female with a recently diagnosed right breast cancer.  She also had enlarged lymph nodes on MRI and underwent biopsy of the  enlarged lymph nodes, which were positive for metastatic breast cancer.  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  OPERATIVE DETAILS:  Yvonne [name] was seen and evaluated the in preop area, and risks and benefits of procedure were again discussed in lay terms.  Informed consent was obtained.  She had already undergone left breast needle localization at the [facility], and the surgical site was marked prior to anesthetic administration.  She was given prophylactic antibiotics and taken to the operating room, placed on table in a supine position, and general endotracheal tube anesthesia was obtained.  Her right chest and the breast and arm were prepped and draped in a standard surgical fashion.  Her right arm was prepped in the field.  Outlined an incision around the nipple-areolar complex and extended this to the axilla, and used the lateral aspect of the incision to start with the axillary dissection portion of the procedure and dissected down into the axillary fat, and then bluntly identified the lateral border of the pectoralis muscle and bluntly with a clamp in blunt dissection, identified the axillary vein.  The axillary contents were taken down mostly using blunt dissection also the chest wall medially until I identified the  long thoracic nerve, and this was left adherent to the chest wall, and dissected the axillary contents away from this nerve cephalad.  I identified the vein and bluntly cleared off the lymphatic tissue and any lymphovascular structures were clipped with multiple hemoclips and divided.  The thoracodorsal artery and vein were identified.  I dissected around this area, and around the vascular bundle, I could not identify the nerve in this area.  After a tedious dissection in this area then laterally as well to the latissimus muscle, I still could not find the  thoracodorsal nerve, and tried to stay superficial to the vascular bundle taking the axillary contents from a cephalad to caudad direction.  She had several enlarged palpable lymph nodes, and because I could not identify the nerve in this area, it was concerning for possible division of the nerve trying to bring down these lymph nodes.  After the axillary contents were carried down laterally to dissect it laterally to the latissimus dorsi medially along the chest wall, I carried out the mastectomy portion of this and created a breast flap circumferentially cephalad to the clavicle, medially to the sternum, and inferiorly to the rectus abdominis muscle.  Flaps were created approximately 7-mm thick to keep the flaps to avoid the flap necrosis and thickness due to oncologically sound procedure.  The flaps were created circumferentially, carried this dissection down to the chest wall, and visualized the pectoralis muscle, lifted the breast tissue and fascia off the pectoralis muscle from medial to lateral direction and carried this up past the pectoralis muscle, and into the axillary contents and dissected it free laterally.  A suture was placed on the medial portion of the mastectomy skin and sent to Pathology as right modified radical mastectomy.  I inspected the axilla again identifying again the long thoracic nerve, and again I could not identify the thoracodorsal nerve, and the expected area appeared to be washed out.  The axilla and noted to be hemostatic.  All the mastectomy flaps were noted to be hemostatic, and 19-French Blake drains were placed.  The lateral drain in the axilla and medial drain under the mastectomy flaps, then the dermis was approximated with multiple interrupted 3-0 Vicryl sutures, and the skin edges were approximated with 4-0 Monocryl subcuticular suture.  Skin was washed and dried.  Benzoin and Steri-Strips were applied.  Sterile dressing was applied.  All the  instruments and drapes were taken down and new set of instruments were used to perform the left procedures. She was prepped and draped again and a semicircular incision was made in the inferior portion of the breast between the nipple and the needle localization wires.  Breast flaps were created circumferentially and the guidewires were brought out then the needle localization wires were brought up through the incision.  I dissected flaps circumferentially and carried this deep into the breast tissue.  It did not appear to actually the area deep enough to the pectoralis muscle, so if additional margins are necessary, margins would be able to be obtained in all directions except for the superficial inferior margin, which would be very difficult to get additional tissue.  After the dissection was done circumferentially, the lesion was delivered up into the wound, and a short silk stitch was placed on the superior margin and a long stitch was placed on the lateral margin, and the lesion was undermined and x- rayed it.  Clips and calcifications were noted to be in the specimen and confirmed by Dr. Deboraha Sprang.  The wound was irrigated with sterile saline solution and noted to be hemostatic and then the dermis was approximated with multiple 3-0 Vicryl sutures, and the cavity was filled with 30 mL of 1% lidocaine with epinephrine and 0.25% Marcaine in a 50:50 mixture. The final dermal sutures were secured.  Skin edges were approximated with 4-0 Monocryl subcuticular suture.  Skin was washed and dried, and benzoin and Steri-Strips were applied.  Sterile dressing was applied. Finally, we redraped again and re-prepped after the left arm was tucked and using ultrasound guidance, I aspirated venous-appearing nonpulsatile blood from the left subclavian vein, and a guidewire was passed and confirmed with fluoroscopic guidance.  Then, a transverse incision was made inferior to this, and a subcutaneous pocket  was created for the port reservoir.  Then, the catheter was tunneled from the access site to the reservoir site in the subcutaneous tissue, and 2-0 Prolene sutures were used to secure the port to the chest wall at the 10 o'clock and 2 o'clock position of the port, however, they are not secured at this time.  Under fluoroscopic guidance, I dilated the subclavian vein over the guidewire, and the dilator and guidewire were removed, and the catheter was placed through the introducer sheath, then the sheath was peeled away and leaving the catheter in place, and the catheter was pulled back to what appeared to be the atriocaval junction.  Then, the catheter was cut and placed on the port reservoir and locked into place with a locking device.  The sutures were secured at 10 o'clock and 2 o'clock position, and the port was aspirated nonpulsatile blood and dark venous blood and flushed without difficulty.  It was flushed with heparinized saline.  The dermis was approximated with a 3-0 Vicryl running suture, and the skin edges were approximated with 4-0 Monocryl subcuticular suture, and the port then was aspirated again and flushed with a final flush concentrated heparin solution.  The skin was washed and dried and Dermabond was applied.  All sponge, needle, and instrument counts correct at the end of the case.  The patient tolerated the procedure well without apparent complication.          ______________________________ [name], MD     BL/MEDQ  D:  04/26/2012  T:  04/26/2012  Job:  [id number]

## 2012-04-26 NOTE — Brief Op Note (Signed)
04/26/2012  6:50 PM  PATIENT:  Yvonne [name]  66 y.o. female  PRE-OPERATIVE DIAGNOSIS:  right breast cancer, left complex lesion positive lymph node   POST-OPERATIVE DIAGNOSIS:  right breast cancer, left complex lesion positive lymph node   PROCEDURE:  Procedure(s) (LRB): MASTECTOMY MODIFIED RADICAL (Right) BREAST LUMPECTOMY WITH NEEDLE LOCALIZATION (Left) INSERTION PORT-A-CATH (Left)  SURGEON:  Surgeon(s) and Role:    * [name], DO - Primary    * Almond Lint, MD - Assisting    * Liz Malady, MD - Assisting  PHYSICIAN ASSISTANT:   ASSISTANTS: Stacy Gardner   ANESTHESIA:   general  EBL:  Total I/O In: 2500 [I.V.:2500] Out: 425 [Urine:200; Blood:225]  BLOOD ADMINISTERED:none  DRAINS: (42F x2) Jackson-Pratt drain(s) with closed bulb suction in the axilla and mastectomy flaps   LOCAL MEDICATIONS USED:  MARCAINE    and LIDOCAINE   SPECIMEN:  Source of Specimen:  right MRM suture medial, Left lumpectomy (SS/LL)  DISPOSITION OF SPECIMEN:  PATHOLOGY  COUNTS:  YES  TOURNIQUET:  * No tourniquets in log *  DICTATION: .Other Dictation: Dictation Number [id number]  PLAN OF CARE: Admit to inpatient   PATIENT DISPOSITION:  PACU - hemodynamically stable.   Delay start of Pharmacological VTE agent (>24hrs) due to surgical blood loss or risk of bleeding: no

## 2012-04-26 NOTE — Interval H&P Note (Signed)
History and Physical Interval Note:  04/26/2012 12:47 PM  Yvonne [name]  has presented today for surgery, with the diagnosis of right breast cancer left complex lesion positive lymph node   The various methods of treatment have been discussed with the patient and family. After consideration of risks, benefits and other options for treatment, the patient has consented to  Procedure(s) (LRB): MASTECTOMY MODIFIED RADICAL (Right) PORT A CATH REVISION (Left) BREAST LUMPECTOMY WITH NEEDLE LOCALIZATION (Left) as a surgical intervention .  The patients' history has been reviewed, patient examined, no change in status, stable for surgery.  I have reviewed the patients' chart and labs.  Questions were answered to the patient's satisfaction.  Because of positive LN, will plan for Right MRM and Left needle loc lumpectomy and mediport placement.  Patient marked and risks of procedure again discussed in lay terms, risks of recurrence, nerve injury, lymphedemay and need for repeat procedures again discussed in lay terms and she expressed understanding and desires to proceed with planned procedure.   [name] DAVID

## 2012-04-26 NOTE — Preoperative (Signed)
Beta Blockers   Reason not to administer Beta Blockers:Not Applicable 

## 2012-04-26 NOTE — Transfer of Care (Signed)
Immediate Anesthesia Transfer of Care Note  Patient: Yvonne [name]  Procedure(s) Performed: Procedure(s) (LRB): MASTECTOMY MODIFIED RADICAL (Right) BREAST LUMPECTOMY WITH NEEDLE LOCALIZATION (Left) INSERTION PORT-A-CATH (Left)  Patient Location: PACU  Anesthesia Type: General  Level of Consciousness: awake, alert  and oriented  Airway & Oxygen Therapy: Patient Spontanous Breathing and Patient connected to nasal cannula oxygen  Post-op Assessment: Report given to PACU RN and Post -op Vital signs reviewed and stable  Post vital signs: Reviewed and stable  Complications: No apparent anesthesia complications

## 2012-04-26 NOTE — Anesthesia Postprocedure Evaluation (Signed)
Anesthesia Post Note  Patient: Yvonne [name]  Procedure(s) Performed: Procedure(s) (LRB): MASTECTOMY MODIFIED RADICAL (Right) BREAST LUMPECTOMY WITH NEEDLE LOCALIZATION (Left) INSERTION PORT-A-CATH (Left)  Anesthesia type: general  Patient location: PACU  Post pain: Pain level controlled  Post assessment: Patient's Cardiovascular Status Stable  Last Vitals:  Filed Vitals:   04/26/12 1900  BP: 147/70  Pulse: 60  Temp:   Resp: 22    Post vital signs: Reviewed and stable  Level of consciousness: sedated  Complications: No apparent anesthesia complications

## 2012-04-26 NOTE — Telephone Encounter (Signed)
Left message that genetic testing is not a covered benefit for [employer] insurance.  Would she be willing to submit her Medicare insurance to see if they would cover this testing?  Asked to please call me back.

## 2012-04-27 ENCOUNTER — Encounter (HOSPITAL_COMMUNITY): Payer: Self-pay | Admitting: *Deleted

## 2012-04-27 HISTORY — PX: BREAST SURGERY: SHX581

## 2012-04-27 LAB — BASIC METABOLIC PANEL
BUN: 17 mg/dL (ref 6–23)
Creatinine, Ser: 0.8 mg/dL (ref 0.50–1.10)
GFR calc Af Amer: 88 mL/min — ABNORMAL LOW (ref >90)
GFR calc non Af Amer: 76 mL/min — ABNORMAL LOW (ref >90)
Glucose, Bld: 134 mg/dL — ABNORMAL HIGH (ref 70–99)
Potassium: 3.3 mEq/L — ABNORMAL LOW (ref 3.5–5.1)

## 2012-04-27 LAB — CBC
HCT: 33.6 % — ABNORMAL LOW (ref 36.0–46.0)
Hemoglobin: 11.4 g/dL — ABNORMAL LOW (ref 12.0–15.0)
MCH: 29.7 pg (ref 26.0–34.0)
MCHC: 33.9 g/dL (ref 30.0–36.0)
MCV: 87.5 fL (ref 78.0–100.0)
RDW: 14.1 % (ref 11.5–15.5)

## 2012-04-27 MED ORDER — POTASSIUM CHLORIDE CRYS ER 20 MEQ PO TBCR
20.0000 meq | EXTENDED_RELEASE_TABLET | Freq: Two times a day (BID) | ORAL | Status: DC
  Administered 2012-04-27 (×2): 20 meq via ORAL
  Filled 2012-04-27 (×4): qty 1

## 2012-04-27 MED ORDER — POTASSIUM CHLORIDE 10 MEQ/100ML IV SOLN
10.0000 meq | INTRAVENOUS | Status: AC
  Administered 2012-04-27 (×2): 10 meq via INTRAVENOUS
  Filled 2012-04-27 (×2): qty 100

## 2012-04-27 NOTE — Progress Notes (Signed)
1 Day Post-Op  Subjective: C/o pain overnight, postop nausea resolved  Objective: Vital signs in last 24 hours: Temp:  [97.3 F (36.3 C)-98.3 F (36.8 C)] 98.3 F (36.8 C) (05/08 0620) Pulse Rate:  [52-74] 70  (05/08 0620) Resp:  [16-32] 17  (05/08 0620) BP: (119-151)/(51-84) 119/63 mmHg (05/08 0620) SpO2:  [93 %-100 %] 95 % (05/08 0620) Weight:  [209 lb (94.802 kg)] 209 lb (94.802 kg) (05/07 2100)    Intake/Output from previous day: 05/07 0701 - 05/08 0700 In: 3539 [I.V.:3439] Out: 480 [Urine:200; Drains:55; Blood:225] Intake/Output this shift:    General appearance: alert, cooperative and no distress Resp: nonlabored Breasts: incisions look okay, no sign of infection, jp drains ss output, left sided incisions okay Cardio: normal rate, regular rhythm  Lab Results:  No results found for this basename: WBC:2,HGB:2,HCT:2,PLT:2 in the last 72 hours BMET  Basename 04/27/12 0630  NA 136  K 3.3*  CL 100  CO2 27  GLUCOSE 134*  BUN 17  CREATININE 0.80  CALCIUM 8.7   PT/INR No results found for this basename: LABPROT:2,INR:2 in the last 72 hours ABG No results found for this basename: PHART:2,PCO2:2,PO2:2,HCO3:2 in the last 72 hours  Studies/Results: Dg Chest Port 1 View  04/26/2012  *RADIOLOGY REPORT*  Clinical Data: New left subclavian power port.  PORTABLE CHEST - 1 VIEW  Comparison: 04/18/2012.  Findings: New left subclavian power port.  No pneumothorax is identified.  The tip of the power port is at or just below the cavoatrial junction.  Lung volumes are lower than on prior, and this probably places the tip of the catheter at the cavoatrial junction on a radiograph with full inspiration.  Right-sided chest drains are present with right axillary dissection clips. Cardiopericardial silhouette and mediastinal contours appear within normal limits.  IMPRESSION:  1. New left subclavian power port.  Tip appears over the right atrium however this may be due to low inspiratory  volumes. 2.  Cervical settings over the right chest.  Original Report Authenticated By: Andreas Newport, M.D.   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  Right breast cancer for which mastectomy is planned.  LEFT BREAST NEEDLE LOCALIZATION WITH MAMMOGRAPHIC GUIDANCE AND SPECIMEN RADIOGRAPH  Patient presents for needle localization prior to surgical excision. The patient and I discussed the procedure of needle localization including benefits and alternatives. We discussed the high likelihood of a successful procedure. We discussed the risks of the procedure, including infection, bleeding, tissue injury and further surgery. Informed written consent was given.  Using mammographic guidance, sterile technique, 2% lidocaine and two 7 cm modified Kopans needles, the clip marking the biopsy site and the linear calcification in the inferior portion of the left breast were localized using a caudocranial approach. Films were labeled and sent with the patient to surgery.  Specimen radiograph was performed at [facility] operating room and confirms the clip, the calcifications and the two wires to be present in the tissue sample.  The specimen is marked for pathology.  IMPRESSION: Needle localization left breast.  No apparent complications.  Original Report Authenticated By: Daryl Eastern, M.D.   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  Right breast cancer for which mastectomy is planned.  LEFT BREAST NEEDLE LOCALIZATION WITH MAMMOGRAPHIC GUIDANCE AND SPECIMEN RADIOGRAPH  Patient presents for needle localization prior to surgical excision. The patient and I  discussed the procedure of needle localization including benefits and alternatives. We discussed the high likelihood of a successful procedure. We discussed the risks of the procedure, including infection, bleeding, tissue injury and further surgery. Informed written consent was given.  Using mammographic guidance, sterile technique, 2% lidocaine and two 7 cm modified Kopans needles, the clip marking the biopsy site and the linear calcification in the inferior portion of the left breast were localized using a caudocranial approach. Films were labeled and sent with the patient to surgery.  Specimen radiograph was performed at [facility] operating room and confirms the clip, the calcifications and the two wires to be present in the tissue sample.  The specimen is marked for pathology.  IMPRESSION: Needle localization left breast.  No apparent complications.  Original Report Authenticated By: Daryl Eastern, M.D.   Dg Fluoro Guide Cv Line-no Report  04/26/2012  CLINICAL DATA: porta cath insertion   FLOURO GUIDE CV LINE  Fluoroscopy was utilized by the requesting physician.  No radiographic  interpretation.      Anti-infectives: Anti-infectives     Start     Dose/Rate Route Frequency Ordered Stop   04/26/12 2045   clindamycin (CLEOCIN) IVPB 900 mg        900 mg 100 mL/hr over 30 Minutes Intravenous  Once 04/26/12 2039 04/26/12 2256          Assessment/Plan: s/p Procedure(s) (LRB): MASTECTOMY MODIFIED RADICAL (Right) BREAST LUMPECTOMY WITH NEEDLE LOCALIZATION (Left) INSERTION PORT-A-CATH (Left) seems to be doing okay, wounds okay,  pain control an issue so far.  Will try oral meds today for smoother pain control.  plan for discharge home tomorrow.  LOS: 1 day    [name] DAVID 04/27/2012

## 2012-04-28 ENCOUNTER — Other Ambulatory Visit: Payer: 59

## 2012-04-28 LAB — DIFFERENTIAL
Basophils Absolute: 0 10*3/uL (ref 0.0–0.1)
Basophils Relative: 0 % (ref 0–1)
Eosinophils Relative: 3 % (ref 0–5)
Monocytes Absolute: 0.5 10*3/uL (ref 0.1–1.0)

## 2012-04-28 LAB — CBC
HCT: 36.4 % (ref 36.0–46.0)
MCHC: 33 g/dL (ref 30.0–36.0)
MCV: 88.8 fL (ref 78.0–100.0)
Platelets: 176 10*3/uL (ref 150–400)
RDW: 14.2 % (ref 11.5–15.5)
WBC: 7 10*3/uL (ref 4.0–10.5)

## 2012-04-28 LAB — BASIC METABOLIC PANEL
BUN: 14 mg/dL (ref 6–23)
CO2: 28 mEq/L (ref 19–32)
Calcium: 9 mg/dL (ref 8.4–10.5)
Chloride: 101 mEq/L (ref 96–112)
Creatinine, Ser: 0.88 mg/dL (ref 0.50–1.10)
GFR calc Af Amer: 78 mL/min — ABNORMAL LOW (ref >90)

## 2012-04-28 MED ORDER — HYDROCODONE-ACETAMINOPHEN 5-325 MG PO TABS: 1.0000 | ORAL_TABLET | ORAL | Status: AC | PRN

## 2012-04-28 NOTE — Progress Notes (Signed)
2 Days Post-Op  Subjective: Had some nausea with her blood draw but has been tolerating diet, pain controlled  Objective: Vital signs in last 24 hours: Temp:  [98.1 F (36.7 C)-98.8 F (37.1 C)] 98.1 F (36.7 C) (05/09 0555) Pulse Rate:  [57-67] 58  (05/09 0555) Resp:  [16-20] 16  (05/09 0555) BP: (131-152)/(41-59) 151/56 mmHg (05/09 0555) SpO2:  [92 %-97 %] 95 % (05/09 0555) Last BM Date: 04/26/12  Intake/Output from previous day: 05/08 0701 - 05/09 0700 In: 660 [P.O.:660] Out: 2460 [Urine:2250; Drains:210] Intake/Output this shift: Total I/O In: 120 [P.O.:120] Out: 300 [Urine:300]  General appearance: alert, cooperative and no distress Resp: nonlabored Breasts: incision okay, flaps viable, JP drains thin ss Cardio: regular  GI: soft, non-tender; bowel sounds normal; no masses,  no organomegaly  Lab Results:   Basename 04/28/12 0540 04/27/12 0630  WBC 7.0 9.0  HGB 12.0 11.4*  HCT 36.4 33.6*  PLT 176 184   BMET  Basename 04/28/12 0540 04/27/12 0630  NA 138 136  K 3.5 3.3*  CL 101 100  CO2 28 27  GLUCOSE 116* 134*  BUN 14 17  CREATININE 0.88 0.80  CALCIUM 9.0 8.7   PT/INR No results found for this basename: LABPROT:2,INR:2 in the last 72 hours ABG No results found for this basename: PHART:2,PCO2:2,PO2:2,HCO3:2 in the last 72 hours  Studies/Results: Dg Chest Port 1 View  04/26/2012  *RADIOLOGY REPORT*  Clinical Data: New left subclavian power port.  PORTABLE CHEST - 1 VIEW  Comparison: 04/18/2012.  Findings: New left subclavian power port.  No pneumothorax is identified.  The tip of the power port is at or just below the cavoatrial junction.  Lung volumes are lower than on prior, and this probably places the tip of the catheter at the cavoatrial junction on a radiograph with full inspiration.  Right-sided chest drains are present with right axillary dissection clips. Cardiopericardial silhouette and mediastinal contours appear within normal limits.  IMPRESSION:   1. New left subclavian power port.  Tip appears over the right atrium however this may be due to low inspiratory volumes. 2.  Cervical settings over the right chest.  Original Report Authenticated By: Andreas Newport, M.D.   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  Right breast cancer for which mastectomy is planned.  LEFT BREAST NEEDLE LOCALIZATION WITH MAMMOGRAPHIC GUIDANCE AND SPECIMEN RADIOGRAPH  Patient presents for needle localization prior to surgical excision. The patient and I discussed the procedure of needle localization including benefits and alternatives. We discussed the high likelihood of a successful procedure. We discussed the risks of the procedure, including infection, bleeding, tissue injury and further surgery. Informed written consent was given.  Using mammographic guidance, sterile technique, 2% lidocaine and two 7 cm modified Kopans needles, the clip marking the biopsy site and the linear calcification in the inferior portion of the left breast were localized using a caudocranial approach. Films were labeled and sent with the patient to surgery.  Specimen radiograph was performed at [facility] operating room and confirms the clip, the calcifications and the two wires to be present in the tissue sample.  The specimen is marked for pathology.  IMPRESSION: Needle localization left breast.  No apparent complications.  Original Report Authenticated By: Daryl Eastern, M.D.   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  Right breast cancer for which mastectomy is planned.  LEFT BREAST NEEDLE LOCALIZATION WITH MAMMOGRAPHIC GUIDANCE AND  SPECIMEN RADIOGRAPH  Patient presents for needle localization prior to surgical excision. The patient and I discussed the procedure of needle localization including benefits and alternatives. We discussed the high likelihood of a successful procedure. We discussed the risks of the procedure, including infection, bleeding, tissue injury and further surgery. Informed written consent was given.  Using mammographic guidance, sterile technique, 2% lidocaine and two 7 cm modified Kopans needles, the clip marking the biopsy site and the linear calcification in the inferior portion of the left breast were localized using a caudocranial approach. Films were labeled and sent with the patient to surgery.  Specimen radiograph was performed at [facility] operating room and confirms the clip, the calcifications and the two wires to be present in the tissue sample.  The specimen is marked for pathology.  IMPRESSION: Needle localization left breast.  No apparent complications.  Original Report Authenticated By: Daryl Eastern, M.D.   Dg Fluoro Guide Cv Line-no Report  04/26/2012  CLINICAL DATA: porta cath insertion   FLOURO GUIDE CV LINE  Fluoroscopy was utilized by the requesting physician.  No radiographic  interpretation.      Anti-infectives: Anti-infectives     Start     Dose/Rate Route Frequency Ordered Stop   04/26/12 2045   clindamycin (CLEOCIN) IVPB 900 mg        900 mg 100 mL/hr over 30 Minutes Intravenous  Once 04/26/12 2039 04/26/12 2256          Assessment/Plan: s/p Procedure(s) (LRB): MASTECTOMY MODIFIED RADICAL (Right) BREAST LUMPECTOMY WITH NEEDLE LOCALIZATION (Left) INSERTION PORT-A-CATH (Left) Discharge she looks okay.  Wants to go home.  will plan for discharge today  LOS: 2 days    Yvonne [name] DAVID 04/28/2012

## 2012-04-28 NOTE — Progress Notes (Signed)
Patient discharged to home in care of spouse and family. Medications and instructions reviewed with patient and spouse with no questions. IV d/c'd with cath intact. Assessment unchanged from this am. Drain care instructions given and return demonstratio given. Patient is to follow up with Dr. Biagio Quint in 2 weeks.

## 2012-04-28 NOTE — Discharge Summary (Signed)
  Physician Discharge Summary  Patient ID: Yvonne [name] MRN: [id number] DOB/AGE: 24-Oct-1946 66 y.o.  Admit date: 04/26/2012 Discharge date: 04/28/2012  Admission Diagnoses: breast cancer  Discharge Diagnoses: same Active Problems:  * No active hospital problems. *    Discharged Condition: good  Hospital Course: to OR 04/26/12 for right MRM, left needle loc lumpectomy, and left mediport.  Admitted for postop care.  SHe was doing well on POD 1 but still with some pain control issues so she stayed for a second day.  On POD 2 her pain was much better controlled and she was stable for discharge to home  Consults: None  Significant Diagnostic Studies:   Treatments: surgery: 04/26/12 right MRM, left needle loc lumpectomy and left mediport placement  Disposition: 01-Home or Self Care  Discharge Orders    Future Appointments: Provider: Department: Dept Phone: Center:   05/06/2012 9:30 AM [name], MD [facility] [phone] None     Future Orders Please Complete By Expires   Diet - low sodium heart healthy      Increase activity slowly      Discharge instructions      Comments:   JP drain care.  Strip and record drain output twice daily and as needed.  Call [phone] for appointment for drain removal when output is less than 29ml/day for consecutive days. Sponge bathe around drains. May change outer gauze with clean gauze daily and as needed.    Call MD for:  temperature >100.4      Call MD for:  persistant nausea and vomiting      Call MD for:  severe uncontrolled pain      Call MD for:  redness, tenderness, or signs of infection (pain, swelling, redness, odor or green/yellow discharge around incision site)      Call MD for:  difficulty breathing, headache or visual disturbances        Medication List  As of 04/28/2012  3:46 PM   TAKE these medications         aspirin 81 MG tablet   Take 81 mg by mouth daily.      CALCIUM + D PO   Take 1 tablet by mouth daily.     celecoxib 200 MG capsule   Commonly known as: CELEBREX   Take 200 mg by mouth daily.      CERTAVITE SENIOR/ANTIOXIDANT Tabs   Take 1 tablet by mouth.      cetirizine 10 MG chewable tablet   Commonly known as: ZYRTEC   Chew 10 mg by mouth daily.      fluticasone 27.5 MCG/SPRAY nasal spray   Commonly known as: VERAMYST   Place 2 sprays into the nose daily.      HYDROcodone-acetaminophen 5-325 MG per tablet   Commonly known as: NORCO   Take 1 tablet by mouth every 4 (four) hours as needed.      potassium chloride SA 20 MEQ tablet   Commonly known as: K-DUR,KLOR-CON   Take 20 mEq by mouth daily.      rosuvastatin 20 MG tablet   Commonly known as: CRESTOR   Take 20 mg by mouth daily.      triamterene-hydrochlorothiazide 37.5-25 MG per tablet   Commonly known as: MAXZIDE-25   Take 1 tablet by mouth daily.             SignedLodema Pilot DAVID 04/28/2012, 3:46 PM

## 2012-04-29 ENCOUNTER — Telehealth: Payer: Self-pay | Admitting: Genetic Counselor

## 2012-04-29 NOTE — Telephone Encounter (Signed)
Ok'd to submit preauthorization for genetic testing to Medicare.

## 2012-05-03 ENCOUNTER — Encounter: Payer: Self-pay | Admitting: Genetic Counselor

## 2012-05-03 ENCOUNTER — Telehealth (INDEPENDENT_AMBULATORY_CARE_PROVIDER_SITE_OTHER): Payer: Self-pay

## 2012-05-03 NOTE — Telephone Encounter (Signed)
Patient called in stating er drain is draining less than 5ml and would like appointment for drain removal. Please call patient at [phone]

## 2012-05-03 NOTE — Telephone Encounter (Signed)
Appointment made 05/10/12 @ 2:45 w/Dr. Biagio Quint

## 2012-05-06 ENCOUNTER — Encounter: Payer: Self-pay | Admitting: Oncology

## 2012-05-06 ENCOUNTER — Ambulatory Visit (HOSPITAL_BASED_OUTPATIENT_CLINIC_OR_DEPARTMENT_OTHER): Payer: Medicare Other | Admitting: Oncology

## 2012-05-06 ENCOUNTER — Telehealth: Payer: Self-pay | Admitting: *Deleted

## 2012-05-06 VITALS — BP 166/83 | HR 109 | Temp 98.4°F | Ht 66.0 in | Wt 202.0 lb

## 2012-05-06 DIAGNOSIS — C50419 Malignant neoplasm of upper-outer quadrant of unspecified female breast: Secondary | ICD-10-CM

## 2012-05-06 NOTE — Telephone Encounter (Signed)
Per staff message from [place], I have scheduled treatment appt for patient.  Appts in computer and Jacki Cones aware.  JMW

## 2012-05-06 NOTE — Telephone Encounter (Signed)
gave patient appointments for 05-31-2012 thru 07-2012 printed out calendar and gave to the patient sent michelle an email thur the epic system to add  on treatments

## 2012-05-06 NOTE — Progress Notes (Signed)
ID: Yvonne [name]   DOB: [birth year]  MR#: [id number]  NWG#:[id number]  HISTORY OF PRESENT ILLNESS: The patient underwent screening mammography 03/02/2012. (Prior mammogram was 2005). Possible mass was noted in the right breast and she was recalled for additional views 03/25/2012. These showed a dense spiculated mass with overlying skin retraction in the upper outer quadrant of the right breast. There were associated coarse linear calcifications. On physical exam there was an area of dimpling in the upper outer quadrant of the right breast and a 5 cm mass was palpable. By ultrasound, this was irregular, hypoechoic, and measured 3.3 cm. In addition, an abnormal lymph node in the right axilla with the thickened cortex measured 1.8 cm.  In the opposite breast, Dr. Whitney Muse found an irregular hypoechoic mass with lobulated margins measuring 2.3 cm. The left axilla was unremarkable. Bilateral breast MRI was obtained 04/01/2012, and showed the mass in question in the right breast to measure 3.8 cm maximally, with overlying skin retraction. In addition, there was a second enhancing bilobed mass in the lower outer quadrant of the right breast, measuring 1 point centimeter. This was felt to possibly represent an intramammary lymph node.  Biopsy of the right breast and right axillary lymph node 03/25/2012 both showed ([id number])  invasive ductal carcinoma, grade 2, estrogen and progesterone receptor positive, both at 100%, with an MIB-1 39%, and no HER-2 amplification. 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 After much discussion the patient decided on right modified radical mastectomy and left lumpectomy. Her subsequent history is as detailed below.  INTERVAL HISTORY: Infinity returns today after her right modified radical mastectomy and left lumpectomy. The left side was benign. The right side shows a T2 N1 grade 2 tumor.  She did not want her husband to come with her today because "he has other things to do".  REVIEW OF SYSTEMS: She still has the drains in place. She is taking celecoxib for pain control. She has not had any fever, bleeding, or other systemic problems. Aside from mild sinus symptoms, a detailed review of systems today was benign  PAST MEDICAL HISTORY: Past Medical History  Diagnosis Date  . Elevated cholesterol     takes Crestor daily  . Hypertension     takes Maxzide daily   . Phlebitis 52yrs ago    hx of-  . Cancer     breast  . Seasonal allergies     takes Zyrtec daily and Nasal Spray prn  . Headache   . Arthritis   . Chronic back pain     arthritis  . Bruises easily   . Hemorrhoids     PAST SURGICAL HISTORY: Past Surgical History  Procedure Date  . Appendectomy 66yrs ago  . Tubal ligation 66yrs ago  . Colonoscopy   . Portacath placement 04/26/2012    Procedure: INSERTION PORT-A-CATH;  Surgeon: [name], DO;  Location: [facility] OR;  Service: General;  Laterality: Left;  Started at 1746.    FAMILY HISTORY Family History  Problem Relation Age of Onset  . Cancer Mother     ovarian cancer  . Cancer Sister     colon cancer  . Cancer Brother     colon cancer  . Cancer Brother     prostate cancer  . Anesthesia problems Neg Hx   . Hypotension Neg Hx   . Malignant hyperthermia Neg Hx   . Pseudochol deficiency Neg Hx   the  patient's father died at the age of 54, from "leukemia." the patient's mother died at the age of 65, for what may have been uterine or ovarian cancer. The patient had 4 brothers and 2 sisters one sister has a history of colon cancer diagnosed at age 10 one brother has a history of colon cancer diagnosed at age 7 a second brother died from complications of prostate cancer. There is no other breast cancer in the family to her knowledge.  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 She is married to [facility], who was present at her multidisciplinary breast cancer clinic visit. Fayrene Fearing is disabled secondary to COPD. Javaeh had 5 children from a prior marriage. There are Annabell Sabal, 38, who was also present at the [facility] visit, and works for [employer]. Gershon Crane, 42, who is employed in Holiday representative, Hedwig Morton 46 who works as a Advertising copywriter, and Sports administrator 47 who works as a Lawyer for [employer]. The fifth child died at age 3. The patient has 13 grandchildren and 9 great grandchildren. She is not a Advice worker.   ADVANCED DIRECTIVES: not in place  HEALTH MAINTENANCE: History  Substance Use Topics  . Smoking status: Former Games developer  . Smokeless tobacco: Not on file   Comment: quit 30+yrs ago  . Alcohol Use: No     Colonoscopy: 2007/ repeat is due  PAP: March 2012  Bone density: never  Lipid panel: per Dr. Mila Palmer  Allergies  Allergen Reactions  . Penicillins Swelling    Current Outpatient Prescriptions  Medication Sig Dispense Refill  . Calcium Carbonate-Vitamin D (CALCIUM + D PO) Take 1 tablet by mouth daily.      . celecoxib (CELEBREX) 200 MG capsule Take 200 mg by mouth daily.      . cetirizine (ZYRTEC) 10 MG chewable tablet Chew 10 mg by mouth daily.      . fluticasone (VERAMYST) 27.5 MCG/SPRAY nasal spray Place 2 sprays into the nose daily.      Marland Kitchen HYDROcodone-acetaminophen (NORCO) 5-325 MG per tablet Take 1 tablet by mouth every 4 (four) hours as needed.  40 tablet  0  . Multiple Vitamins-Minerals (CERTAVITE SENIOR/ANTIOXIDANT) TABS Take 1 tablet by mouth.      . potassium chloride SA (K-DUR,KLOR-CON) 20 MEQ tablet Take 20 mEq by mouth daily.      . rosuvastatin (CRESTOR) 20 MG tablet Take 20 mg by mouth daily.      Marland Kitchen triamterene-hydrochlorothiazide (MAXZIDE-25) 37.5-25 MG per tablet Take  1 tablet by mouth daily.      Marland Kitchen aspirin 81 MG tablet Take 81 mg by mouth daily.        OBJECTIVE: middle-aged white woman in no acute distress Filed Vitals:   05/06/12 0919  BP: 166/83  Pulse: 109  Temp: 98.4 F (36.9 C)     Body mass index is 32.60 kg/(m^2).    ECOG FS: 0  Sclerae unicteric Oropharynx clear No peripheral adenopathy Lungs no rales or rhonchi Heart regular rate and rhythm Abd benign MSK no focal spinal tenderness, no peripheral edema, specifically no edema in the right upper extremity Neuro: nonfocal Breasts: the right breast is status post mastectomy. I did not uncover the incision. Drains are still in place with approximately 15 cc of serosanguineous fluid in the  bulb. Left breast status post lumpectomy. The incision is healing nicely.  LAB RESULTS: Lab Results  Component Value Date   WBC 7.0 04/28/2012   NEUTROABS 4.0 04/28/2012   HGB 12.0 04/28/2012   HCT 36.4 04/28/2012   MCV 88.8 04/28/2012   PLT 176 04/28/2012      Chemistry      Component Value Date/Time   NA 138 04/28/2012 0540   K 3.5 04/28/2012 0540   CL 101 04/28/2012 0540   CO2 28 04/28/2012 0540   BUN 14 04/28/2012 0540   CREATININE 0.88 04/28/2012 0540      Component Value Date/Time   CALCIUM 9.0 04/28/2012 0540   ALKPHOS 60 04/06/2012 1211   AST 29 04/06/2012 1211   ALT 29 04/06/2012 1211   BILITOT 0.5 04/06/2012 1211       Lab Results  Component Value Date   LABCA2 26 04/06/2012    No components found with this basename: [id number]    No results found for this basename: INR:1;PROTIME:1 in the last 168 hours  Urinalysis No results found for this basename: colorurine,  appearanceur,  labspec,  phurine,  glucoseu,  hgbur,  bilirubinur,  ketonesur,  proteinur,  urobilinogen,  nitrite,  leukocytesur    STUDIES:  04/05/2012  *RADIOLOGY REPORT*  Clinical Data: New diagnosis right-sided breast cancer  BILATERAL BREAST MRI WITH AND WITHOUT CONTRAST  Technique: Multiplanar, multisequence MR images of both  breasts were obtained prior to and following the intravenous administration of 20ml of Multihance.  Three dimensional images were evaluated at the independent DynaCad workstation.  Comparison:  Mammogram dated 03/02/2012  Findings: Foci of nonspecific enhancement seen bilaterally.  A round, heterogeneously enhancing mass with spiculated margins is seen in the upper-outer quadrant of the right breast, anteriorly measuring 3.8 x 3.6 x 3.5 cm corresponding to the known, biopsy- proven malignancy.  A biopsy clip is seen in association with the mass.  There is overlying skin retraction although, no skin enhancement is seen at this time.  In the lower outer quadrant of the right breast, middle third, there is an enhancing, bilobed mass with circumscribed and irregular margins,  associated high T2 signal measuring 1.0 x 0.8 x 0.5 cm.  This could represent an intramammary lymph node although it remains suspicious considering the irregular margins.  A lymph node with and obliterated fatty hilum and thickened cortex is seen in the right axilla measuring 1.7 x 1.1 x 2.4 cm corresponding to the biopsy-proven metastatic disease.  No other axillary or internal mammary adenopathy is identified. No suspicious enhancement seen in the left breast.  No left axillary or internal mammary adenopathy is present.  IMPRESSION: 1.  Known right-sided breast cancer and metastatic disease, right axilla.  Suspicious mass, lower outer quadrant of the right breast detailed above.  If breast conservation therapy is desired, a second look ultrasound is recommended.  If this is not identified, an MRI guided biopsy is recommended. 2.  No MRI specific evidence of malignancy, left breast.  THREE-DIMENSIONAL MR IMAGE RENDERING ON INDEPENDENT WORKSTATION:  Three-dimensional MR images were rendered by post-processing of the original MR data on an independent workstation.  The three- dimensional MR images were interpreted, and findings were reported in the  accompanying complete MRI report for this study.  BI-RADS CATEGORY 6:  Known biopsy-proven malignancy - appropriate action should be taken.  Original Report Authenticated By: Hiram Gash, M.D.    ASSESSMENT: 66 year old [place] woman  (1) status post right modified radical mastectomy 04/26/2012  for a pT2 pN1 invasive ductal carcinoma, grade 2, estrogen and progesterone both 100% positive, with an MIB-1 of 39%, and no HER-2 amplification.   (2) status post left lumpectomy 04/26/2012, benign   PLAN: Jaquay is eager to get on with chemotherapy. However she does need to heal up from her surgery first. We went over her situation again in detail. She understands of her risk of recurrence is in the 54% range and it can decrease to about 25% with chemotherapy and antiestrogen therapy. We are going to go with Cytoxan and Taxotere every 3 weeks x4. She will see my physician's assistant in June 3 to make sure she is ready to start chemotherapy and to discuss antinausea medications as well as how to take dexamethasone and use the EMLA cream. She will have her first chemotherapy on June 11 which means she will be done with chemotherapy mid-August. She will then be ready to followup with radiation and then as she will be on anti-estrogens for 5-10 years.  She knows to call for any problems that may develop before the next visit    Kemonte Ullman C    05/06/2012

## 2012-05-10 ENCOUNTER — Ambulatory Visit (INDEPENDENT_AMBULATORY_CARE_PROVIDER_SITE_OTHER): Payer: Medicare Other | Admitting: General Surgery

## 2012-05-10 ENCOUNTER — Encounter (INDEPENDENT_AMBULATORY_CARE_PROVIDER_SITE_OTHER): Payer: Self-pay | Admitting: General Surgery

## 2012-05-10 VITALS — BP 142/84 | HR 71 | Temp 97.7°F | Resp 16 | Ht 66.0 in | Wt 203.6 lb

## 2012-05-10 DIAGNOSIS — Z5189 Encounter for other specified aftercare: Secondary | ICD-10-CM

## 2012-05-10 DIAGNOSIS — Z4889 Encounter for other specified surgical aftercare: Secondary | ICD-10-CM

## 2012-05-10 NOTE — Progress Notes (Signed)
Subjective:     Patient ID: Yvonne [name], female   DOB: 10-12-1946, 66 y.o.   MRN: [id number]  HPI This patient follows up status post right modified radical mastectomy with left needle located excisional biopsy and left-sided Mediport placement. She has drains in place and follow her pathology results and for drain removal. She has some limited motion in her right arm and has been intentionally restricting her movements in this arm. Her drains have been decreasing output with only 5-10 cc coming out of her axillary drain and 15-25 cc coming out of her mastectomy drains.  Pathology c/w a T2 N1 M0 cancer.  Margins negative, 1/11 nodes positive.  Review of Systems     Objective:   Physical Exam Drains with SS output, and drains removed.  Incisions healing well without flap necrosis, ischemia, or infection.  She has some extra skin and ridge inferiorly but all incisions look good otherwise.  No lymphedema.    Assessment:     Status post right modified radical mastectomies with left breast lumpectomy and left Mediport placement-doing well Overall he thinks he seems to be doing very well without any evidence of postoperative complications.  no evidence of lymphedema. Her drains were removed today. She has a T2 N1 cancer and is scheduled to start her chemotherapy soon. I recommended that she followup with her radiation oncologist to discuss her pathology results but I am not sure that she will actually need any followup radiation. Overall she seems to be doing well but I did recommend that she increase her range of motion exercises for her right arm as she seems to be babying the arm to much and I am afraid that if she doesnt work on her ROM that she may limit her mobility.    Plan:     I will see her back in about 3 months or sooner as needed. She is scheduled to start her chemotherapy since and she is going to follow up with her radiation oncologist as well. She agreed to the work with her  physical therapy and work on her mobility as well. Prescription for postmastectomy bras given

## 2012-05-20 ENCOUNTER — Encounter (INDEPENDENT_AMBULATORY_CARE_PROVIDER_SITE_OTHER): Payer: Self-pay | Admitting: Surgery

## 2012-05-20 ENCOUNTER — Ambulatory Visit (INDEPENDENT_AMBULATORY_CARE_PROVIDER_SITE_OTHER): Payer: Medicare Other | Admitting: Surgery

## 2012-05-20 VITALS — BP 140/88 | HR 88 | Temp 98.8°F | Resp 18 | Ht 66.0 in | Wt 201.2 lb

## 2012-05-20 DIAGNOSIS — C50419 Malignant neoplasm of upper-outer quadrant of unspecified female breast: Secondary | ICD-10-CM

## 2012-05-20 DIAGNOSIS — IMO0002 Reserved for concepts with insufficient information to code with codable children: Secondary | ICD-10-CM | POA: Insufficient documentation

## 2012-05-20 NOTE — Progress Notes (Signed)
Subjective:     Patient ID: Yvonne [name], female   DOB: 1946-02-23, 66 y.o.   MRN: [id number]  HPI  Yvonne [name]  1946/08/27 [id number]  Patient Care Team: Dorrene German, MD as PCP - General (Internal Medicine) Jonna Coup, MD as Consulting Physician (Radiation Oncology) [name], DO as Consulting Physician (General Surgery) [name], MD as Consulting Physician (Medical Oncology)  This patient is a 66 y.o.female who presents today for surgical evaluation.   Reason for visit: Drainage from mastectomy site.  Patient is 3 weeks status post right mastectomy for a T2 N1 cancer. She had her drains removed last week.  Yesterday she noted drainage from the medial corner of her incision through the Steri-Strips. Family recommended she come in. She denies any fevers or chills or nausea or vomiting. No worsening pain. No foul drainage or odor.  Pain otherwise well. She states that she is due to start chemotherapy in mid June  Patient Active Problem List  Diagnoses  . Cancer of upper-outer quadrant of female breast  . Hyperlipidemia  . Hypertension  . Osteoarthritis  . S/P appendectomy  . Seroma, postmastectomy on right    Past Medical History  Diagnosis Date  . Elevated cholesterol     takes Crestor daily  . Hypertension     takes Maxzide daily   . Phlebitis 3yrs ago    hx of-  . Cancer     breast  . Seasonal allergies     takes Zyrtec daily and Nasal Spray prn  . Headache   . Arthritis   . Chronic back pain     arthritis  . Bruises easily   . Hemorrhoids     Past Surgical History  Procedure Date  . Appendectomy 27yrs ago  . Tubal ligation 50yrs ago  . Colonoscopy   . Portacath placement 04/26/2012    Procedure: INSERTION PORT-A-CATH;  Surgeon: [name], DO;  Location: [facility] OR;  Service: General;  Laterality: Left;  Started at 1846.  . Breast surgery 2013    Right mod mastectomy  . Breast surgery 2013    Left Breast NL lumpectomy     History   Social History  . Marital Status: Married    Spouse Name: N/A    Number of Children: N/A  . Years of Education: N/A   Occupational History  . Not on file.   Social History Main Topics  . Smoking status: Former Games developer  . Smokeless tobacco: Not on file   Comment: quit 30+yrs ago  . Alcohol Use: No  . Drug Use: No  . Sexually Active: Yes    Birth Control/ Protection: Post-menopausal   Other Topics Concern  . Not on file   Social History Narrative  . No narrative on file    Family History  Problem Relation Age of Onset  . Cancer Mother     ovarian cancer  . Cancer Sister     colon cancer  . Cancer Brother     colon cancer  . Cancer Brother     prostate cancer  . Anesthesia problems Neg Hx   . Hypotension Neg Hx   . Malignant hyperthermia Neg Hx   . Pseudochol deficiency Neg Hx     Current Outpatient Prescriptions  Medication Sig Dispense Refill  . aspirin 81 MG tablet Take 81 mg by mouth daily.      . Calcium Carbonate-Vitamin D (CALCIUM + D PO) Take 1 tablet by mouth daily.      Marland Kitchen  celecoxib (CELEBREX) 200 MG capsule Take 200 mg by mouth daily.      . cetirizine (ZYRTEC) 10 MG chewable tablet Chew 10 mg by mouth daily.      . fluticasone (VERAMYST) 27.5 MCG/SPRAY nasal spray Place 2 sprays into the nose daily.      . Multiple Vitamins-Minerals (CERTAVITE SENIOR/ANTIOXIDANT) TABS Take 1 tablet by mouth.      . potassium chloride SA (K-DUR,KLOR-CON) 20 MEQ tablet Take 20 mEq by mouth daily.      . rosuvastatin (CRESTOR) 20 MG tablet Take 20 mg by mouth daily.      Marland Kitchen triamterene-hydrochlorothiazide (MAXZIDE-25) 37.5-25 MG per tablet Take 1 tablet by mouth daily.         Allergies  Allergen Reactions  . Penicillins Swelling    BP 140/88  Pulse 88  Temp 98.8 F (37.1 C)  Resp 18  Ht 5\' 6"  (1.676 m)  Wt 201 lb 3.2 oz (91.264 kg)  BMI 32.47 kg/m2  Dg Chest Port 1 View  04/26/2012  *RADIOLOGY REPORT*  Clinical Data: New left subclavian power  port.  PORTABLE CHEST - 1 VIEW  Comparison: 04/18/2012.  Findings: New left subclavian power port.  No pneumothorax is identified.  The tip of the power port is at or just below the cavoatrial junction.  Lung volumes are lower than on prior, and this probably places the tip of the catheter at the cavoatrial junction on a radiograph with full inspiration.  Right-sided chest drains are present with right axillary dissection clips. Cardiopericardial silhouette and mediastinal contours appear within normal limits.  IMPRESSION:  1. New left subclavian power port.  Tip appears over the right atrium however this may be due to low inspiratory volumes. 2.  Cervical settings over the right chest.  Original Report Authenticated By: Andreas Newport, M.D.   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  Right breast cancer for which mastectomy is planned.  LEFT BREAST NEEDLE LOCALIZATION WITH MAMMOGRAPHIC GUIDANCE AND SPECIMEN RADIOGRAPH  Patient presents for needle localization prior to surgical excision. The patient and I discussed the procedure of needle localization including benefits and alternatives. We discussed the high likelihood of a successful procedure. We discussed the risks of the procedure, including infection, bleeding, tissue injury and further surgery. Informed written consent was given.  Using mammographic guidance, sterile technique, 2% lidocaine and two 7 cm modified Kopans needles, the clip marking the biopsy site and the linear calcification in the inferior portion of the left breast were localized using a caudocranial approach. Films were labeled and sent with the patient to surgery.  Specimen radiograph was performed at [facility] operating room and confirms the clip, the calcifications and the two wires to be present in the  tissue sample.  The specimen is marked for pathology.  IMPRESSION: Needle localization left breast.  No apparent complications.  Original Report Authenticated By: Daryl Eastern, M.D.   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  Right breast cancer for which mastectomy is planned.  LEFT BREAST NEEDLE LOCALIZATION WITH MAMMOGRAPHIC GUIDANCE AND SPECIMEN RADIOGRAPH  Patient presents for needle localization prior to surgical excision. The patient and I discussed the procedure  of needle localization including benefits and alternatives. We discussed the high likelihood of a successful procedure. We discussed the risks of the procedure, including infection, bleeding, tissue injury and further surgery. Informed written consent was given.  Using mammographic guidance, sterile technique, 2% lidocaine and two 7 cm modified Kopans needles, the clip marking the biopsy site and the linear calcification in the inferior portion of the left breast were localized using a caudocranial approach. Films were labeled and sent with the patient to surgery.  Specimen radiograph was performed at [facility] operating room and confirms the clip, the calcifications and the two wires to be present in the tissue sample.  The specimen is marked for pathology.  IMPRESSION: Needle localization left breast.  No apparent complications.  Original Report Authenticated By: Daryl Eastern, M.D.   Dg Fluoro Guide Cv Line-no Report  04/26/2012  CLINICAL DATA: porta cath insertion   FLOURO GUIDE CV LINE  Fluoroscopy was utilized by the requesting physician.  No radiographic  interpretation.       Review of Systems  Constitutional: Negative for fever, chills and diaphoresis.  HENT: Negative for ear pain, sore throat and trouble swallowing.   Eyes: Negative  for photophobia and visual disturbance.  Respiratory: Negative for cough and choking.   Cardiovascular: Negative for chest pain and palpitations.  Gastrointestinal: Negative for nausea, vomiting, abdominal pain, diarrhea, constipation, anal bleeding and rectal pain.  Genitourinary: Negative for dysuria, frequency and difficulty urinating.  Musculoskeletal: Negative for myalgias and gait problem.  Skin: Positive for color change and wound. Negative for pallor and rash.  Neurological: Negative for dizziness, speech difficulty, weakness and numbness.  Hematological: Negative for adenopathy.  Psychiatric/Behavioral: Negative for confusion and agitation. The patient is not nervous/anxious.        Objective:   Physical Exam  Constitutional: She is oriented to person, place, and time. She appears well-developed and well-nourished. No distress.  HENT:  Head: Normocephalic.  Mouth/Throat: Oropharynx is clear and moist. No oropharyngeal exudate.  Eyes: Conjunctivae and EOM are normal. Pupils are equal, round, and reactive to light. No scleral icterus.  Neck: Normal range of motion. No tracheal deviation present.  Cardiovascular: Normal rate and intact distal pulses.   Pulmonary/Chest: Effort normal. No respiratory distress. She exhibits no tenderness.    Abdominal: Soft. She exhibits no distension. There is no tenderness. Hernia confirmed negative in the right inguinal area and confirmed negative in the left inguinal area.       Incisions clean with normal healing ridges.  No hernias  Genitourinary: No vaginal discharge found.  Musculoskeletal: Normal range of motion. She exhibits no tenderness.  Lymphadenopathy:       Right: No inguinal adenopathy present.       Left: No inguinal adenopathy present.  Neurological: She is alert and oriented to person, place, and time. No cranial nerve deficit. She exhibits normal muscle tone. Coordination normal.  Skin: Skin is warm and dry. No rash noted. She  is not diaphoretic.  Psychiatric: She has a normal mood and affect. Her behavior is normal.   At the end of placing the Nu Gauze and expressing the seroma the patient got lightheaded and passed out. We applied smelling salt and sternal rub. She immediately woke up. She was out for 15 seconds.  She was oriented x4. We had her applied down in bed for 15 minutes. She felt much better.    Assessment:     Post-op seroma    Plan:  Leave wick in place. Change her outer dressing at least daily if not several times a day. Expect moderate drainage from the next few days that should taper off  Return to clinic in a few days for followup, follow-up with  Dr. Biagio Quint.   No evidence of infection = no need for antibiotics at this time.  She may need to delay post adjuvant treatment pending how long it takes for this to resolve.  At the end of the visit she felt normal. She was walking well. Not lightheaded or dizzy. Not tachycardic. Breathing well. Completely oriented. She felt comfortable going home. She came by means of a cab and will use it to get home. Marland Kitchen

## 2012-05-20 NOTE — Patient Instructions (Signed)
WOUND CARE  It is important that the wound be kept open.   -Keeping the skin edges apart will allow the wound to gradually heal from the base upwards.   - If the skin edges of the wound close too early, a new fluid pocket can form and infection can occur. -This is the reason to pack deeper wounds with gauze or ribbon -This is why drained wounds cannot be sewed closed right away  A healthy wound should form a lining of bright red "beefy" granulating tissue that will help shrink the wound and help the edges grow new skin into it.   -A little mucus / yellow discharge is normal (the body's natural way to try and form a scab) and should be gently washed off with soap and water with daily dressing changes.  -Green or foul smelling drainage implies bacterial colonization and can slow wound healing - a short course of antibiotic ointment (3-5 days) can help it clear up.  Call the doctor if it does not improve or worsens  -Avoid use of antibiotic ointments for more than a week as they can slow wound healing over time.    -Sometimes other wound care products will be used to reduce need for dressing changes and/or help clean up dirty wounds -Sometimes the surgeon needs to debride the wound in the office to remove dead or infected tissue out of the wound so it can heal more quickly and safely.    Change the dressing at least once a day -Wash the wound with mild soap and water gently every day.  It is good to shower or bathe the wound to help it clean out. -Use clean 4x4 gauze for medium/large wounds (it does not need to be sterile, just clean) -Keep the skin dry around the wound to prevent breakdown and irritation. -Leave the ribbon wick in the wound - we will change it on the next visit -Cover with a clean gauze and tape -paper or Medipore tape tend to be gentle on the skin -rotate the orientation of the tape to avoid repeated stress/trauma on the skin -using an ACE or Coban wrap on wounds on arms or  legs can be used instead.  Complete all antibiotics through the entire prescription to help the infection heal and prevent new places of infection   Returning the see the surgeon is helpful to follow the healing process and help the wound close as fast as possible.

## 2012-05-23 ENCOUNTER — Telehealth: Payer: Self-pay | Admitting: *Deleted

## 2012-05-23 ENCOUNTER — Ambulatory Visit (INDEPENDENT_AMBULATORY_CARE_PROVIDER_SITE_OTHER): Payer: Medicare Other | Admitting: General Surgery

## 2012-05-23 ENCOUNTER — Encounter: Payer: Self-pay | Admitting: Physician Assistant

## 2012-05-23 ENCOUNTER — Ambulatory Visit (HOSPITAL_BASED_OUTPATIENT_CLINIC_OR_DEPARTMENT_OTHER): Payer: Medicare Other | Admitting: Physician Assistant

## 2012-05-23 ENCOUNTER — Encounter (INDEPENDENT_AMBULATORY_CARE_PROVIDER_SITE_OTHER): Payer: Self-pay | Admitting: General Surgery

## 2012-05-23 VITALS — BP 144/92 | HR 82 | Temp 98.8°F | Resp 14 | Ht 60.0 in | Wt 200.2 lb

## 2012-05-23 VITALS — BP 144/80 | HR 91 | Temp 98.5°F | Ht 60.0 in | Wt 200.9 lb

## 2012-05-23 DIAGNOSIS — C773 Secondary and unspecified malignant neoplasm of axilla and upper limb lymph nodes: Secondary | ICD-10-CM

## 2012-05-23 DIAGNOSIS — C50419 Malignant neoplasm of upper-outer quadrant of unspecified female breast: Secondary | ICD-10-CM

## 2012-05-23 DIAGNOSIS — Z09 Encounter for follow-up examination after completed treatment for conditions other than malignant neoplasm: Secondary | ICD-10-CM

## 2012-05-23 DIAGNOSIS — Z170 Estrogen receptor positive status [ER+]: Secondary | ICD-10-CM

## 2012-05-23 MED ORDER — ONDANSETRON HCL 8 MG PO TABS: ORAL_TABLET | ORAL | Status: DC

## 2012-05-23 MED ORDER — PROCHLORPERAZINE MALEATE 10 MG PO TABS: 10.0000 mg | ORAL_TABLET | Freq: Four times a day (QID) | ORAL | Status: DC | PRN

## 2012-05-23 MED ORDER — LIDOCAINE-PRILOCAINE 2.5-2.5 % EX CREA: TOPICAL_CREAM | CUTANEOUS | Status: DC | PRN

## 2012-05-23 MED ORDER — HYDROCODONE-ACETAMINOPHEN 5-325 MG PO TABS: 1.0000 | ORAL_TABLET | Freq: Four times a day (QID) | ORAL | Status: AC | PRN

## 2012-05-23 MED ORDER — DEXAMETHASONE 4 MG PO TABS: ORAL_TABLET | ORAL | Status: DC

## 2012-05-23 NOTE — Patient Instructions (Signed)
Keep dressing over this and change daily or as needed

## 2012-05-23 NOTE — Progress Notes (Signed)
ID: RAMAYA GUILE   DOB: 25-Dec-1945  MR#: [id number]  WUJ#:[id number]  HISTORY OF PRESENT ILLNESS: The patient underwent screening mammography 03/02/2012. (Prior mammogram was 2005). Possible mass was noted in the right breast and she was recalled for additional views 03/25/2012. These showed a dense spiculated mass with overlying skin retraction in the upper outer quadrant of the right breast. There were associated coarse linear calcifications. On physical exam there was an area of dimpling in the upper outer quadrant of the right breast and a 5 cm mass was palpable. By ultrasound, this was irregular, hypoechoic, and measured 3.3 cm. In addition, an abnormal lymph node in the right axilla with the thickened cortex measured 1.8 cm.  In the opposite breast, Dr. Whitney Muse found an irregular hypoechoic mass with lobulated margins measuring 2.3 cm. The left axilla was unremarkable. Bilateral breast MRI was obtained 04/01/2012, and showed the mass in question in the right breast to measure 3.8 cm maximally, with overlying skin retraction. In addition, there was a second enhancing bilobed mass in the lower outer quadrant of the right breast, measuring 1 point centimeter. This was felt to possibly represent an intramammary lymph node.  Biopsy of the right breast and right axillary lymph node 03/25/2012 both showed ([id number])  invasive ductal carcinoma, grade 2, estrogen and progesterone receptor positive, both at 100%, with an MIB-1 39%, and no HER-2 amplification. 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 After much discussion the patient decided on right modified radical mastectomy and left lumpectomy. Her subsequent history is as detailed below.  INTERVAL HISTORY: Chanae returns today for followup of her right breast carcinoma. Interval history is remarkable for development of a seroma in the medial aspect of the  mastectomy incision. This continues to heal on its own. She no longer has to pack the area. She is following up regularly with her surgeons. Unfortunately, also Alysson is anxious to get started, everyone agrees that it would be prudent to delay her chemotherapy until this is completely healed.   REVIEW OF SYSTEMS: Chaya has had no fevers, chills, or night sweats. No rashes or abnormal bleeding. She's eating and drinking well no nausea or change in bowel habits. No chest pain or shortness of breath. She has occasional headaches, but nothing she would consider abnormal. No dizziness or change in vision. No new or unusual pain elsewhere. No peripheral swelling.  A detailed review of systems is otherwise noncontributory.  PAST MEDICAL HISTORY: Past Medical History  Diagnosis Date  . Elevated cholesterol     takes Crestor daily  . Hypertension     takes Maxzide daily   . Phlebitis 24yrs ago    hx of-  . Cancer     breast  . Seasonal allergies     takes Zyrtec daily and Nasal Spray prn  . Headache   . Arthritis   . Chronic back pain     arthritis  . Bruises easily   . Hemorrhoids     PAST SURGICAL HISTORY: Past Surgical History  Procedure Date  . Appendectomy 21yrs ago  . Tubal ligation 79yrs ago  . Colonoscopy   . Portacath placement 04/26/2012    Procedure: INSERTION PORT-A-CATH;  Surgeon: [name], DO;  Location: [facility] OR;  Service: General;  Laterality: Left;  Started at 1746.  . Breast surgery 2013    Right mod mastectomy  . Breast surgery 2013    Left Breast NL lumpectomy  FAMILY HISTORY Family History  Problem Relation Age of Onset  . Cancer Mother     ovarian cancer  . Cancer Sister     colon cancer  . Cancer Brother     colon cancer  . Cancer Brother     prostate cancer  . Anesthesia problems Neg Hx   . Hypotension Neg Hx   . Malignant hyperthermia Neg Hx   . Pseudochol deficiency Neg Hx   the patient's father died at the age of 19, from "leukemia." the  patient's mother died at the age of 23, for what may have been uterine or ovarian cancer. The patient had 4 brothers and 2 sisters one sister has a history of colon cancer diagnosed at age 17 one brother has a history of colon cancer diagnosed at age 98 a second brother died from complications of prostate cancer. There is no other breast cancer in the family to her knowledge.  GYNECOLOGIC HISTORY: Menarche age 77, she is GX P5, first pregnancy to term age 55, last period approximately age 76. 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 She is married to [facility], who was present at her multidisciplinary breast cancer clinic visit. Fayrene Fearing is disabled secondary to COPD. Tasheka had 5 children from a prior marriage. There are Annabell Sabal, 38, who was also present at the [facility] visit, and works for [employer]. Gershon Crane, 42, who is employed in Holiday representative, Hedwig Morton 46 who works as a Advertising copywriter, and Sports administrator 47 who works as a Lawyer for [employer]. The fifth child died at age 72. The patient has 13 grandchildren and 9 great grandchildren. She is not a Advice worker.   ADVANCED DIRECTIVES: not in place  HEALTH MAINTENANCE: History  Substance Use Topics  . Smoking status: Former Games developer  . Smokeless tobacco: Never Used   Comment: quit 30+yrs ago  . Alcohol Use: No     Colonoscopy: 2007/ repeat is due  PAP: March 2012  Bone density: never  Lipid panel: per Dr. Mila Palmer  Allergies  Allergen Reactions  . Penicillins Anaphylaxis    Current Outpatient Prescriptions  Medication Sig Dispense Refill  . Calcium Carbonate-Vitamin D (CALCIUM + D PO) Take 1 tablet by mouth daily.      . celecoxib (CELEBREX) 200 MG capsule Take 200 mg by mouth daily.      . cetirizine (ZYRTEC) 10 MG chewable tablet Chew 10 mg by mouth daily.      . fluticasone (VERAMYST) 27.5 MCG/SPRAY  nasal spray Place 2 sprays into the nose daily.      Marland Kitchen HYDROcodone-acetaminophen (NORCO) 5-325 MG per tablet Take 1-2 tablets by mouth every 6 (six) hours as needed for pain.  30 tablet  0  . Multiple Vitamins-Minerals (CERTAVITE SENIOR/ANTIOXIDANT) TABS Take 1 tablet by mouth.      . potassium chloride SA (K-DUR,KLOR-CON) 20 MEQ tablet Take 20 mEq by mouth daily.      . rosuvastatin (CRESTOR) 20 MG tablet Take 20 mg by mouth daily.      Marland Kitchen triamterene-hydrochlorothiazide (MAXZIDE-25) 37.5-25 MG per tablet Take 1 tablet by mouth daily.      Marland Kitchen aspirin 81 MG tablet Take 81 mg by mouth daily.      Marland Kitchen dexamethasone (DECADRON) 4 MG tablet Take 2 tablets two times a day the day before Taxotere. Then take 2 tabs two times a day starting the day after chemo  for 3 days.  30 tablet  1  . lidocaine-prilocaine (EMLA) cream Apply topically as needed.  30 g  1  . ondansetron (ZOFRAN) 8 MG tablet Take 1 tablet two times a day starting the day after chemo for 3 days. Then take 1 tab two times a day as needed for nausea or vomiting.  30 tablet  2  . prochlorperazine (COMPAZINE) 10 MG tablet Take 1 tablet (10 mg total) by mouth every 6 (six) hours as needed (Nausea or vomiting).  30 tablet  3    OBJECTIVE: middle-aged white woman in no acute distress Filed Vitals:   05/23/12 1347  BP: 144/80  Pulse: 91  Temp: 98.5 F (36.9 C)     Body mass index is 39.24 kg/(m^2).    ECOG FS: 1  Filed Weights   05/23/12 1347  Weight: 200 lb 14.4 oz (91.128 kg)   Remainder of physical exam was deferred today.   LAB RESULTS: Lab Results  Component Value Date   WBC 7.0 04/28/2012   NEUTROABS 4.0 04/28/2012   HGB 12.0 04/28/2012   HCT 36.4 04/28/2012   MCV 88.8 04/28/2012   PLT 176 04/28/2012      Chemistry      Component Value Date/Time   NA 138 04/28/2012 0540   K 3.5 04/28/2012 0540   CL 101 04/28/2012 0540   CO2 28 04/28/2012 0540   BUN 14 04/28/2012 0540   CREATININE 0.88 04/28/2012 0540      Component Value Date/Time    CALCIUM 9.0 04/28/2012 0540   ALKPHOS 60 04/06/2012 1211   AST 29 04/06/2012 1211   ALT 29 04/06/2012 1211   BILITOT 0.5 04/06/2012 1211       Lab Results  Component Value Date   LABCA2 26 04/06/2012    STUDIES:  No recent studies.   ASSESSMENT: 65 year old [place] woman  (1) status post right modified radical mastectomy 04/26/2012 for a pT2 pN1 invasive ductal carcinoma, grade 2, estrogen and progesterone both 100% positive, with an MIB-1 of 39%, and no HER-2 amplification.   (2) status post left lumpectomy 04/26/2012, benign   PLAN:   Klohe is not healed quite enough to proceed with chemotherapy at this time. We're delaying her first chemotherapy from June 11 2 June 18. She does see her surgeon for followup again next week and once he gives a sclerae, we will initiate for cycles of docetaxel/cyclophosphamide given every 3 weeks.  Taressa and I spent over half of our 40 minute appointment today reviewing her regimen, and discussing her antinausea medications. She was given prescriptions along with written instructions on how to utilize dexamethasone, ondansetron, prochlorperazine, and EMLA cream. She was able to repeat the instructions back to me, and voiced understanding of how to utilize all of these medications appropriately. I will see her briefly on June 17, hopefully ready to initiate her first chemotherapy on June 18. We will review her schedule at that time.  She hopefully will be done with chemotherapy in late August. She will then be ready to followup with radiation and then will be on anti-estrogens for 5-10 years.  In the meanwhile, Sharia is to call with any changes or problems.   Sharmarke Cicio    05/23/2012

## 2012-05-23 NOTE — Progress Notes (Signed)
Subjective:     Patient ID: Yvonne [name], female   DOB: 1946-12-07, 66 y.o.   MRN: [id number]  HPI  76 yof s/p right mrm who was seen Friday and had large seroma that decompressed out of medial aspect of mastectomy incision.  She returns today with a wick in place and drainage is significantly decreased.  She is otherwise well. Review of Systems     Objective:   Physical Exam Left mastectomy incision with 5 mm opening medially with no real seroma present anymore and no real drainage, no infection    Assessment:     S/p right mrm with open wound    Plan:     I think this will just heal on own. Doesn't really need to be packed just covered at this point.  Hopefully will heal soon so she can begin chemo but may delay a little. Needs to f/u with Dr. Biagio Quint next week.

## 2012-05-24 ENCOUNTER — Telehealth: Payer: Self-pay | Admitting: *Deleted

## 2012-05-24 NOTE — Telephone Encounter (Signed)
gave patient appointment for 06-06-2012 06-07-2012 06-08-2012 printed out calendar and gave to the patient

## 2012-05-26 ENCOUNTER — Telehealth (INDEPENDENT_AMBULATORY_CARE_PROVIDER_SITE_OTHER): Payer: Self-pay

## 2012-05-26 NOTE — Telephone Encounter (Signed)
Notified patient of follow up appointment for Friday 06/03/12 w/Dr. Biagio Quint @ 12:15pm.

## 2012-05-31 ENCOUNTER — Other Ambulatory Visit: Payer: 59 | Admitting: Lab

## 2012-05-31 ENCOUNTER — Ambulatory Visit: Payer: 59

## 2012-06-01 ENCOUNTER — Ambulatory Visit: Payer: 59

## 2012-06-03 ENCOUNTER — Encounter (INDEPENDENT_AMBULATORY_CARE_PROVIDER_SITE_OTHER): Payer: Self-pay | Admitting: General Surgery

## 2012-06-03 ENCOUNTER — Ambulatory Visit (INDEPENDENT_AMBULATORY_CARE_PROVIDER_SITE_OTHER): Payer: Medicare Other | Admitting: General Surgery

## 2012-06-03 VITALS — BP 138/82 | HR 68 | Temp 97.3°F | Resp 16 | Ht 66.0 in | Wt 200.4 lb

## 2012-06-03 DIAGNOSIS — Z4889 Encounter for other specified surgical aftercare: Secondary | ICD-10-CM

## 2012-06-03 DIAGNOSIS — Z5189 Encounter for other specified aftercare: Secondary | ICD-10-CM

## 2012-06-03 NOTE — Progress Notes (Signed)
Subjective:     Patient ID: Yvonne [name], female   DOB: 1946-11-11, 66 y.o.   MRN: [id number]  HPI This patient follows up for wound check status post right modified radical mastectomy for a T2 N1 breast cancer. She had the medial portion of her wound open and draining some serous fluid but this has not required any packing and she has not had any infection. She feels well and was full mobility of her arms she has no fevers or chills. She has minimal drainage on the bandage. She has some slight swelling in her arm but overall feels very good.  Review of Systems     Objective:   Physical Exam No distress and nontoxic-appearing Her incision is healing well without sign of infection. There is no erythema. The area at the medial aspect of the incisions which had opened up is well approximated with some fibrinous exudate but this should be healed shortly. There is no evidence of persistent seroma.    Assessment:     Status post right modified radical mastectomy for breast cancer Her incision is healing well and though she appeared to have had some drainage of a seroma, this is almost healed and she should be ready for her chemotherapy soon. There is no sign of infection. I think that in another week or 2 should be completely healed and it would be okay to start treatment as long as the incision is scarred.    Plan:     This should be completely healed and another week or 2 and she can resume her adjuvant treatments when this has resolved. I will see her back in 2 months for repeat evaluation. In the meantime, she can increase activity as tolerated and work on her range of motion.

## 2012-06-06 ENCOUNTER — Telehealth: Payer: Self-pay | Admitting: Oncology

## 2012-06-06 ENCOUNTER — Ambulatory Visit (HOSPITAL_BASED_OUTPATIENT_CLINIC_OR_DEPARTMENT_OTHER): Payer: Medicare Other | Admitting: Physician Assistant

## 2012-06-06 ENCOUNTER — Other Ambulatory Visit (HOSPITAL_BASED_OUTPATIENT_CLINIC_OR_DEPARTMENT_OTHER): Payer: Medicare Other | Admitting: Lab

## 2012-06-06 ENCOUNTER — Other Ambulatory Visit: Payer: Self-pay | Admitting: Physician Assistant

## 2012-06-06 ENCOUNTER — Encounter: Payer: Self-pay | Admitting: Physician Assistant

## 2012-06-06 VITALS — BP 154/88 | HR 77 | Temp 98.1°F | Ht 66.0 in | Wt 202.0 lb

## 2012-06-06 DIAGNOSIS — C50419 Malignant neoplasm of upper-outer quadrant of unspecified female breast: Secondary | ICD-10-CM

## 2012-06-06 DIAGNOSIS — M7989 Other specified soft tissue disorders: Secondary | ICD-10-CM

## 2012-06-06 DIAGNOSIS — Z170 Estrogen receptor positive status [ER+]: Secondary | ICD-10-CM

## 2012-06-06 DIAGNOSIS — C773 Secondary and unspecified malignant neoplasm of axilla and upper limb lymph nodes: Secondary | ICD-10-CM

## 2012-06-06 LAB — CBC WITH DIFFERENTIAL/PLATELET
Basophils Absolute: 0.1 10*3/uL (ref 0.0–0.1)
EOS%: 2.7 % (ref 0.0–7.0)
Eosinophils Absolute: 0.2 10*3/uL (ref 0.0–0.5)
HGB: 13.2 g/dL (ref 11.6–15.9)
LYMPH%: 40.2 % (ref 14.0–49.7)
MCH: 29.3 pg (ref 25.1–34.0)
MCV: 87 fL (ref 79.5–101.0)
MONO%: 7.9 % (ref 0.0–14.0)
NEUT#: 3.1 10*3/uL (ref 1.5–6.5)
Platelets: 229 10*3/uL (ref 145–400)
RBC: 4.51 10*6/uL (ref 3.70–5.45)

## 2012-06-06 NOTE — Telephone Encounter (Signed)
S/w marti from the lymphedema clinic regrading this pt needing an appt with them. Per [facility] they will call the pt.

## 2012-06-06 NOTE — Progress Notes (Signed)
ID: [name]   DOB: 12-25-1945  MR#: [id number]  JYN#:[id number]  HISTORY OF PRESENT ILLNESS: The patient underwent screening mammography 03/02/2012. (Prior mammogram was 2005). Possible mass was noted in the right breast and she was recalled for additional views 03/25/2012. These showed a dense spiculated mass with overlying skin retraction in the upper outer quadrant of the right breast. There were associated coarse linear calcifications. On physical exam there was an area of dimpling in the upper outer quadrant of the right breast and a 5 cm mass was palpable. By ultrasound, this was irregular, hypoechoic, and measured 3.3 cm. In addition, an abnormal lymph node in the right axilla with the thickened cortex measured 1.8 cm.  In the opposite breast, Dr. Whitney [name] found an irregular hypoechoic mass with lobulated margins measuring 2.3 cm. The left axilla was unremarkable. Bilateral breast MRI was obtained 04/01/2012, and showed the mass in question in the right breast to measure 3.8 cm maximally, with overlying skin retraction. In addition, there was a second enhancing bilobed mass in the lower outer quadrant of the right breast, measuring 1 point centimeter. This was felt to possibly represent an intramammary lymph node.  Biopsy of the right breast and right axillary lymph node 03/25/2012 both showed ([id number])  invasive ductal carcinoma, grade 2, estrogen and progesterone receptor positive, both at 100%, with an MIB-1 39%, and no HER-2 amplification. 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 After much discussion the patient decided on right modified radical mastectomy and left lumpectomy. Her subsequent history is as detailed below.  INTERVAL HISTORY: Yvonne [name] returns today for followup of her right breast carcinoma. Following her right mastectomy in may, Yvonne [name] formed a seroma in the medial aspect of the  mastectomy incision. This continues to heal with no signs of infection, and Yvonne [name] continues to followup regularly with her surgeon, Dr. Biagio [name]. She last saw him on Friday, June 14, and he feels that she will be ready to initiate chemotherapy within the next 2 weeks.   REVIEW OF SYSTEMS: Yvonne [name] has had no fevers, chills, or night sweats. Her energy level is good. No rashes or abnormal bleeding. She's eating and drinking well no nausea or change in bowel habits. No chest pain or shortness of breath. She has occasional headaches, but nothing she would consider abnormal. No dizziness or change in vision. No new or unusual pain elsewhere. She's noticed some slight swelling in the upper portion of her right arm. The arm is a little uncomfortable at times, and she has been working on her range of motion.  A detailed review of systems is otherwise noncontributory.  PAST MEDICAL HISTORY: Past Medical History  Diagnosis Date  . Elevated cholesterol     takes Crestor daily  . Hypertension     takes Maxzide daily   . Phlebitis 32yrs ago    hx of-  . Cancer     breast  . Seasonal allergies     takes Zyrtec daily and Nasal Spray prn  . Headache   . Arthritis   . Chronic back pain     arthritis  . Bruises easily   . Hemorrhoids     PAST SURGICAL HISTORY: Past Surgical History  Procedure Date  . Appendectomy 60yrs ago  . Tubal ligation 47yrs ago  . Colonoscopy   . Portacath placement 04/26/2012    Procedure: INSERTION PORT-A-CATH;  Surgeon: Yvonne Pilot, DO;  Location: [facility] OR;  Service:  General;  Laterality: Left;  Started at 1746.  . Breast surgery 2013    Right mod mastectomy  . Breast surgery 2013    Left Breast NL lumpectomy    FAMILY HISTORY Family History  Problem Relation Age of Onset  . Cancer Mother     ovarian cancer  . Cancer Sister     colon cancer  . Cancer Brother     colon cancer  . Cancer Brother     prostate cancer  . Anesthesia problems Neg Hx   . Hypotension Neg Hx    . Malignant hyperthermia Neg Hx   . Pseudochol deficiency Neg Hx   the patient's father died at the age of 83, from "leukemia." the patient's mother died at the age of 55, for what may have been uterine or ovarian cancer. The patient had 4 brothers and 2 sisters one sister has a history of colon cancer diagnosed at age 54 one brother has a history of colon cancer diagnosed at age 73 a second brother died from complications of prostate cancer. There is no other breast cancer in the family to her knowledge.  GYNECOLOGIC HISTORY: Menarche age 58, she is GX P5, first pregnancy to term age 19, last period approximately age 64. 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 She is married to Yvonne [name], who was present at her multidisciplinary breast cancer clinic visit. Yvonne [name] is disabled secondary to COPD. Yvonne [name] had 5 children from a prior marriage. There are Yvonne [name], 38, who was also present at the [facility] visit, and works for [employer]. Yvonne [name], 42, who is employed in Holiday representative, Yvonne [name] 46 who works as a Advertising copywriter, and Yvonne [name] 47 who works as a Lawyer for [employer]. The fifth child died at age 88. The patient has 13 grandchildren and 9 great grandchildren. She is not a Advice worker.   ADVANCED DIRECTIVES: not in place  HEALTH MAINTENANCE: History  Substance Use Topics  . Smoking status: Former Games developer  . Smokeless tobacco: Never Used   Comment: quit 30+yrs ago  . Alcohol Use: No     Colonoscopy: 2007/ repeat is due  PAP: March 2012  Bone density: never  Lipid panel: per Dr. Mila [name]  Allergies  Allergen Reactions  . Penicillins Anaphylaxis    Current Outpatient Prescriptions  Medication Sig Dispense Refill  . aspirin 81 MG tablet Take 81 mg by mouth daily.      . Calcium Carbonate-Vitamin D (CALCIUM + D PO) Take 1 tablet by mouth daily.        . celecoxib (CELEBREX) 200 MG capsule Take 200 mg by mouth daily.      . cetirizine (ZYRTEC) 10 MG chewable tablet Chew 10 mg by mouth daily.      Marland Kitchen dexamethasone (DECADRON) 4 MG tablet Take 2 tablets two times a day the day before Taxotere. Then take 2 tabs two times a day starting the day after chemo for 3 days.  30 tablet  1  . fluticasone (VERAMYST) 27.5 MCG/SPRAY nasal spray Place 2 sprays into the nose daily.      Marland Kitchen lidocaine-prilocaine (EMLA) cream Apply topically as needed.  30 g  1  . Multiple Vitamins-Minerals (CERTAVITE SENIOR/ANTIOXIDANT) TABS Take 1 tablet by mouth.      . ondansetron (ZOFRAN) 8 MG tablet Take 1 tablet two times a day starting the day after chemo for 3 days. Then  take 1 tab two times a day as needed for nausea or vomiting.  30 tablet  2  . potassium chloride SA (K-DUR,KLOR-CON) 20 MEQ tablet Take 20 mEq by mouth daily.      . prochlorperazine (COMPAZINE) 10 MG tablet Take 1 tablet (10 mg total) by mouth every 6 (six) hours as needed (Nausea or vomiting).  30 tablet  3  . rosuvastatin (CRESTOR) 20 MG tablet Take 20 mg by mouth daily.      Marland Kitchen triamterene-hydrochlorothiazide (MAXZIDE-25) 37.5-25 MG per tablet Take 1 tablet by mouth daily.        OBJECTIVE: middle-aged white woman in no acute distress Filed Vitals:   06/06/12 1040  BP: 154/88  Pulse: 77  Temp: 98.1 F (36.7 C)     Body mass index is 32.60 kg/(m^2).    ECOG FS: 1  Filed Weights   06/06/12 1040  Weight: 202 lb (91.627 kg)  Physical Exam: HEENT:  Sclerae anicteric   Nodes:  No cervical, supraclavicular, or axillary lymphadenopathy palpated.  Breast Exam:  Patient is status post right mastectomy. Incision continues to heal with an area of continued granulation on the medial side of the incision. No evidence of drainage. No excessive erythema or evidence of infection. Left breast is benign. Lungs:  Clear to auscultation bilaterally.  No crackles, rhonchi, or wheezes.   Heart:  Regular rate and  rhythm.   Abdomen:  Soft, obese, nontender.  Positive bowel sounds.  No organomegaly or masses palpated.   Musculoskeletal:  No focal spinal tenderness to palpation.  Extremities:  There is some slight swelling in the right upper extremity, especially between the shoulder and the elbow. No additional peripheral edema and no peripheral cyanosis.   Skin:  Benign.   Neuro:  Nonfocal. Alert and oriented x3.     LAB RESULTS: Lab Results  Component Value Date   WBC 6.4 06/06/2012   NEUTROABS 3.1 06/06/2012   HGB 13.2 06/06/2012   HCT 39.2 06/06/2012   MCV 87.0 06/06/2012   PLT 229 06/06/2012      Chemistry      Component Value Date/Time   NA 138 04/28/2012 0540   K 3.5 04/28/2012 0540   CL 101 04/28/2012 0540   CO2 28 04/28/2012 0540   BUN 14 04/28/2012 0540   CREATININE 0.88 04/28/2012 0540      Component Value Date/Time   CALCIUM 9.0 04/28/2012 0540   ALKPHOS 60 04/06/2012 1211   AST 29 04/06/2012 1211   ALT 29 04/06/2012 1211   BILITOT 0.5 04/06/2012 1211       Lab Results  Component Value Date   LABCA2 26 04/06/2012    STUDIES:  No recent studies.   ASSESSMENT: 67 year old [place] woman  (1) status post right modified radical mastectomy 04/26/2012 for a pT2 pN1 invasive ductal carcinoma, grade 2, estrogen and progesterone both 100% positive, with an MIB-1 of 39%, and no HER-2 amplification.   (2) status post left lumpectomy 04/26/2012, benign   PLAN:   Yvonne [name] is still not healed quite enough to proceed with chemotherapy at this time. According to Dr. Delice Lesch evaluation on Friday, Yvonne [name] should be ready to initiate chemotherapy within the next 2 weeks. Accordingly, she will return to see [place] for brief followup next week, and we will go ahead and schedule her first dose of Taxotere/Cytoxan for July 2, with Neulasta on July 3. Once we know for sure that Yvonne [name] is in fact being treated on July 2, we will reassess  her schedule and adjust her appointments accordingly. Our plan is to treat  with 4 cycles of docetaxel/cyclophosphamide given on a Q. three-week basis. Yvonne [name] tells me that per Dr. Biagio [name], she will not need radiation therapy.  I am also referring Yvonne [name] to the lymphedema clinic to be evaluated for her the swelling and discomfort in her right upper extremity.  Yvonne [name] voices understanding and agreement with our plan, and will call with any changes or problems.    Fizza Scales    06/06/2012

## 2012-06-06 NOTE — Telephone Encounter (Signed)
lmonvm adviisng the pt of her r/s appts from 06/07/2012 to 06/20/2012 per the md ords.

## 2012-06-07 ENCOUNTER — Ambulatory Visit: Payer: Medicare Other

## 2012-06-08 ENCOUNTER — Ambulatory Visit: Payer: Medicare Other

## 2012-06-20 ENCOUNTER — Ambulatory Visit: Payer: Medicare Other | Admitting: Oncology

## 2012-06-20 ENCOUNTER — Telehealth (INDEPENDENT_AMBULATORY_CARE_PROVIDER_SITE_OTHER): Payer: Self-pay

## 2012-06-20 ENCOUNTER — Other Ambulatory Visit: Payer: Medicare Other | Admitting: Lab

## 2012-06-20 NOTE — Telephone Encounter (Signed)
Patient called requesting pain med refill. Masty was may 7th. Last seen [facility] on 6/14 with seroma. Having pain at night at incision area up into axilla. Wanta refill so help with pain so she can sleep at night. [place] all [place]. Please advise

## 2012-06-20 NOTE — Telephone Encounter (Signed)
Called pt and to see what pain med she was taking and she was taking Hydrocodone 5/325 1-2 6 hrs called into [facility] [phone] and pt is aware of calling in Rx

## 2012-06-21 ENCOUNTER — Ambulatory Visit: Payer: Medicare Other

## 2012-06-21 ENCOUNTER — Other Ambulatory Visit: Payer: 59 | Admitting: Lab

## 2012-06-21 ENCOUNTER — Ambulatory Visit: Payer: 59

## 2012-06-22 ENCOUNTER — Ambulatory Visit: Payer: Medicare Other

## 2012-06-22 ENCOUNTER — Other Ambulatory Visit: Payer: Self-pay | Admitting: Oncology

## 2012-06-22 ENCOUNTER — Telehealth: Payer: Self-pay | Admitting: *Deleted

## 2012-06-22 ENCOUNTER — Ambulatory Visit: Payer: 59

## 2012-06-22 ENCOUNTER — Other Ambulatory Visit: Payer: Self-pay | Admitting: Physician Assistant

## 2012-06-22 NOTE — Telephone Encounter (Signed)
patient confirmed over the phone the new date and time starting at 12:30pm with labs md follow and treatment 

## 2012-06-22 NOTE — Progress Notes (Signed)
Patient did not come in for chemotherapy yesterday, so need for injection today.  Patient states that she was told to not come in until today when they would discuss her schedule.  Dr Darnelle Catalan aware of pt being here and his nurse will discuss her schedule with her.

## 2012-06-28 ENCOUNTER — Ambulatory Visit: Payer: Medicare Other

## 2012-06-28 ENCOUNTER — Ambulatory Visit (HOSPITAL_BASED_OUTPATIENT_CLINIC_OR_DEPARTMENT_OTHER): Payer: Medicare Other | Admitting: Oncology

## 2012-06-28 ENCOUNTER — Other Ambulatory Visit (HOSPITAL_BASED_OUTPATIENT_CLINIC_OR_DEPARTMENT_OTHER): Payer: Medicare Other | Admitting: Lab

## 2012-06-28 ENCOUNTER — Ambulatory Visit: Payer: Medicare Other | Admitting: Oncology

## 2012-06-28 ENCOUNTER — Telehealth: Payer: Self-pay | Admitting: *Deleted

## 2012-06-28 ENCOUNTER — Other Ambulatory Visit: Payer: Self-pay

## 2012-06-28 ENCOUNTER — Other Ambulatory Visit: Payer: Medicare Other | Admitting: Lab

## 2012-06-28 ENCOUNTER — Ambulatory Visit (HOSPITAL_BASED_OUTPATIENT_CLINIC_OR_DEPARTMENT_OTHER): Payer: Medicare Other

## 2012-06-28 VITALS — BP 130/62 | HR 81 | Temp 99.1°F | Ht 66.0 in | Wt 208.4 lb

## 2012-06-28 VITALS — BP 125/66 | HR 51 | Temp 98.1°F

## 2012-06-28 DIAGNOSIS — C773 Secondary and unspecified malignant neoplasm of axilla and upper limb lymph nodes: Secondary | ICD-10-CM

## 2012-06-28 DIAGNOSIS — C50419 Malignant neoplasm of upper-outer quadrant of unspecified female breast: Secondary | ICD-10-CM

## 2012-06-28 DIAGNOSIS — Z170 Estrogen receptor positive status [ER+]: Secondary | ICD-10-CM

## 2012-06-28 DIAGNOSIS — Z5111 Encounter for antineoplastic chemotherapy: Secondary | ICD-10-CM

## 2012-06-28 LAB — CBC WITH DIFFERENTIAL/PLATELET
BASO%: 0.1 % (ref 0.0–2.0)
EOS%: 0.1 % (ref 0.0–7.0)
LYMPH%: 17.2 % (ref 14.0–49.7)
MCHC: 34.5 g/dL (ref 31.5–36.0)
MCV: 85 fL (ref 79.5–101.0)
MONO%: 7.3 % (ref 0.0–14.0)
Platelets: 231 10*3/uL (ref 145–400)
RBC: 4.47 10*6/uL (ref 3.70–5.45)
nRBC: 0 % (ref 0–0)

## 2012-06-28 LAB — COMPREHENSIVE METABOLIC PANEL
ALT: 24 U/L (ref 0–35)
AST: 21 U/L (ref 0–37)
Alkaline Phosphatase: 52 U/L (ref 39–117)
Creatinine, Ser: 0.99 mg/dL (ref 0.50–1.10)
Total Bilirubin: 0.4 mg/dL (ref 0.3–1.2)

## 2012-06-28 MED ORDER — HEPARIN SOD (PORK) LOCK FLUSH 100 UNIT/ML IV SOLN
500.0000 [IU] | Freq: Once | INTRAVENOUS | Status: AC | PRN
  Administered 2012-06-28: 500 [IU]
  Filled 2012-06-28: qty 5

## 2012-06-28 MED ORDER — DOCETAXEL CHEMO INJECTION 160 MG/16ML
75.0000 mg/m2 | Freq: Once | INTRAVENOUS | Status: AC
  Administered 2012-06-28: 160 mg via INTRAVENOUS
  Filled 2012-06-28: qty 16

## 2012-06-28 MED ORDER — DEXAMETHASONE SODIUM PHOSPHATE 4 MG/ML IJ SOLN
20.0000 mg | Freq: Once | INTRAMUSCULAR | Status: AC
  Administered 2012-06-28: 20 mg via INTRAVENOUS

## 2012-06-28 MED ORDER — CYCLOPHOSPHAMIDE CHEMO INJECTION 1 GM
600.0000 mg/m2 | Freq: Once | INTRAMUSCULAR | Status: AC
  Administered 2012-06-28: 1240 mg via INTRAVENOUS
  Filled 2012-06-28: qty 62

## 2012-06-28 MED ORDER — SODIUM CHLORIDE 0.9 % IJ SOLN
10.0000 mL | INTRAMUSCULAR | Status: DC | PRN
  Administered 2012-06-28: 10 mL
  Filled 2012-06-28: qty 10

## 2012-06-28 MED ORDER — ONDANSETRON 16 MG/50ML IVPB (CHCC)
16.0000 mg | Freq: Once | INTRAVENOUS | Status: AC
  Administered 2012-06-28: 16 mg via INTRAVENOUS

## 2012-06-28 MED ORDER — SODIUM CHLORIDE 0.9 % IV SOLN
Freq: Once | INTRAVENOUS | Status: AC
  Administered 2012-06-28: 14:00:00 via INTRAVENOUS

## 2012-06-28 NOTE — Telephone Encounter (Signed)
Per staff message I have scheduled appts. JMW  

## 2012-06-28 NOTE — Progress Notes (Signed)
ID: Yvonne [name]   DOB: 03-Dec-1946  MR#: [id number]  AOZ#:[id number]  HISTORY OF PRESENT ILLNESS: The patient underwent screening mammography 03/02/2012. (Prior mammogram was 2005). Possible mass was noted in the right breast and she was recalled for additional views 03/25/2012. These showed a dense spiculated mass with overlying skin retraction in the upper outer quadrant of the right breast. There were associated coarse linear calcifications. On physical exam there was an area of dimpling in the upper outer quadrant of the right breast and a 5 cm mass was palpable. By ultrasound, this was irregular, hypoechoic, and measured 3.3 cm. In addition, an abnormal lymph node in the right axilla with the thickened cortex measured 1.8 cm.  In the opposite breast, Dr. Whitney Muse found an irregular hypoechoic mass with lobulated margins measuring 2.3 cm. The left axilla was unremarkable. Bilateral breast MRI was obtained 04/01/2012, and showed the mass in question in the right breast to measure 3.8 cm maximally, with overlying skin retraction. In addition, there was a second enhancing bilobed mass in the lower outer quadrant of the right breast, measuring 1 point centimeter. This was felt to possibly represent an intramammary lymph node.  Biopsy of the right breast and right axillary lymph node 03/25/2012 both showed ([id number])  invasive ductal carcinoma, grade 2, estrogen and progesterone receptor positive, both at 100%, with an MIB-1 39%, and no HER-2 amplification. 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 After much discussion the patient decided on right modified radical mastectomy and left lumpectomy. Her subsequent history is as detailed below.  INTERVAL HISTORY: Yvonne [name] returns today with her husband Fayrene Fearing and her daughter Adolm Joseph for followup of duties breast cancer. Today is day 1 cycle 1 of cyclophosphamide/  docetaxel.  REVIEW OF SYSTEMS: She is doing very well overall and her incision has finally completely healed. She is having no fevers, rash, swelling, or bleeding. She does have some pain occasionally at the site of the scar, worse at night, and she takes some pain medication at night to help her sleep. A detailed review of systems was otherwise entirely negative today.  PAST MEDICAL HISTORY: Past Medical History  Diagnosis Date  . Elevated cholesterol     takes Crestor daily  . Hypertension     takes Maxzide daily   . Phlebitis 91yrs ago    hx of-  . Cancer     breast  . Seasonal allergies     takes Zyrtec daily and Nasal Spray prn  . Headache   . Arthritis   . Chronic back pain     arthritis  . Bruises easily   . Hemorrhoids     PAST SURGICAL HISTORY: Past Surgical History  Procedure Date  . Appendectomy 58yrs ago  . Tubal ligation 74yrs ago  . Colonoscopy   . Portacath placement 04/26/2012    Procedure: INSERTION PORT-A-CATH;  Surgeon: [name], DO;  Location: [facility] OR;  Service: General;  Laterality: Left;  Started at 1746.  . Breast surgery 2013    Right mod mastectomy  . Breast surgery 2013    Left Breast NL lumpectomy    FAMILY HISTORY Family History  Problem Relation Age of Onset  . Cancer Mother     ovarian cancer  . Cancer Sister     colon cancer  . Cancer Brother     colon cancer  . Cancer Brother     prostate cancer  . Anesthesia  problems Neg Hx   . Hypotension Neg Hx   . Malignant hyperthermia Neg Hx   . Pseudochol deficiency Neg Hx   the patient's father died at the age of 62, from "leukemia." the patient's mother died at the age of 41, for what may have been uterine or ovarian cancer. The patient had 4 brothers and 2 sisters one sister has a history of colon cancer diagnosed at age 17 one brother has a history of colon cancer diagnosed at age 53 a second brother died from complications of prostate cancer. There is no other breast cancer in the  family to her knowledge.  GYNECOLOGIC HISTORY: Menarche age 75, she is GX P5, first pregnancy to term age 18, last period approximately age 4. 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 She is married to [facility], who was present at her multidisciplinary breast cancer clinic visit. Fayrene Fearing is disabled secondary to COPD. Yvonne [name] had 5 children from a prior marriage. There are Yvonne [name], 38, who was also present at the [facility] visit, and works for [employer]. Yvonne [name], 42, who is employed in [employer] representative, Yvonne [name] 46 who works as a Advertising copywriter, and Yvonne [name] 47 who works as a Lawyer for [employer]. The fifth child died at age 65. The patient has 13 grandchildren and 9 great grandchildren. She is not a Advice worker.   ADVANCED DIRECTIVES: not in place  HEALTH MAINTENANCE: History  Substance Use Topics  . Smoking status: Former Games developer  . Smokeless tobacco: Never Used   Comment: quit 30+yrs ago  . Alcohol Use: No     Colonoscopy: 2007/ repeat is due  PAP: March 2012  Bone density: never  Lipid panel: per Dr. Mila Palmer  Allergies  Allergen Reactions  . Penicillins Anaphylaxis    Current Outpatient Prescriptions  Medication Sig Dispense Refill  . aspirin 81 MG tablet Take 81 mg by mouth daily.      . Calcium Carbonate-Vitamin D (CALCIUM + D PO) Take 1 tablet by mouth daily.      . celecoxib (CELEBREX) 200 MG capsule Take 200 mg by mouth daily.      . cetirizine (ZYRTEC) 10 MG chewable tablet Chew 10 mg by mouth daily.      Marland Kitchen dexamethasone (DECADRON) 4 MG tablet Take 2 tablets two times a day the day before Taxotere. Then take 2 tabs two times a day starting the day after chemo for 3 days.  30 tablet  1  . fluticasone (VERAMYST) 27.5 MCG/SPRAY nasal spray Place 2 sprays into the nose daily.      Marland Kitchen lidocaine-prilocaine (EMLA) cream Apply topically as needed.   30 g  1  . Multiple Vitamins-Minerals (CERTAVITE SENIOR/ANTIOXIDANT) TABS Take 1 tablet by mouth.      . ondansetron (ZOFRAN) 8 MG tablet Take 1 tablet two times a day starting the day after chemo for 3 days. Then take 1 tab two times a day as needed for nausea or vomiting.  30 tablet  2  . potassium chloride SA (K-DUR,KLOR-CON) 20 MEQ tablet Take 20 mEq by mouth daily.      . prochlorperazine (COMPAZINE) 10 MG tablet Take 1 tablet (10 mg total) by mouth every 6 (six) hours as needed (Nausea or vomiting).  30 tablet  3  . rosuvastatin (CRESTOR) 20 MG tablet Take 20 mg by mouth daily.      Marland Kitchen triamterene-hydrochlorothiazide (MAXZIDE-25) 37.5-25  MG per tablet Take 1 tablet by mouth daily.        OBJECTIVE: middle-aged white woman who appears well Filed Vitals:   06/28/12 1255  BP: 130/62  Pulse: 81  Temp: 99.1 F (37.3 C)     Body mass index is 33.64 kg/(m^2).    ECOG FS: 1  Filed Weights   06/28/12 1255  Weight: 208 lb 6.4 oz (94.53 kg)   Sclerae unicteric  Oropharynx clear  No peripheral adenopathy  Lungs no rales or rhonchi  Heart regular rate and rhythm  Abd benign  MSK no focal spinal tenderness, no peripheral edema, specifically in the right upper extremity  Neuro: nonfocal  Breasts: the right breast is status post mastectomy. The incision has completely healed, with no open areas, no significant erythema, and no fluid collection. The left breast is unremarkable.  LAB RESULTS: Lab Results  Component Value Date   WBC 15.2* 06/28/2012   NEUTROABS 11.5* 06/28/2012   HGB 13.1 06/28/2012   HCT 38.0 06/28/2012   MCV 85.0 06/28/2012   PLT 231 06/28/2012      Chemistry      Component Value Date/Time   NA 138 04/28/2012 0540   K 3.5 04/28/2012 0540   CL 101 04/28/2012 0540   CO2 28 04/28/2012 0540   BUN 14 04/28/2012 0540   CREATININE 0.88 04/28/2012 0540      Component Value Date/Time   CALCIUM 9.0 04/28/2012 0540   ALKPHOS 60 04/06/2012 1211   AST 29 04/06/2012 1211   ALT 29 04/06/2012 1211     BILITOT 0.5 04/06/2012 1211       Lab Results  Component Value Date   LABCA2 26 04/06/2012    STUDIES:  No recent studies.   ASSESSMENT: 66 y.o.  [place] woman  (1) status post right modified radical mastectomy 04/26/2012 for a pT2 pN1, stage IIB invasive ductal carcinoma, grade 2, estrogen and progesterone both 100% positive, with an MIB-1 of 39%, and no HER-2 amplification.   (2) status post left lumpectomy 04/26/2012, benign  (3) adjuvant cyclophosphamide/ docetaxol started 06/28/2012, to be repeated every 3 weeks x 4   PLAN:  Ennifer understood her surgeon Dr. Biagio Quint to have told her that she was "cancer free" and that she will not need postmastectomy radiation. Some of this may be wishful thinking on her part, but in any case in our original discussion on 04/06/2012 when the patient was seen at the multidisciplinary clinic radiation was planned as part of her treatment. I have made her an appointment for her to discuss this further with Dr. Mitzi Hansen a once she has completed chemotherapy in September.  I am reluctant to use the phrase "cancer free," since that may imply to the patient that they are already cured, and if that is the case then why would she be getting chemotherapy. I prefer to use a "in remission" and that is the way I described her situation to her.  She starting chemotherapy today, and has a good understanding of the possible toxicities side effects and complications. She has appropriate anti-emetics and other supportive medicines at hand and has a "map" of how to take them. I have encouraged her to cause with any questions whatsoever. Hopefully we will be able to get her through her for chemotherapy treatments with a minimum of complications.   Saba Neuman C    06/28/2012

## 2012-06-28 NOTE — Patient Instructions (Signed)
[facility] Discharge Instructions for Patients Receiving Chemotherapy  Today you received the following chemotherapy agents Taxotere and Cytoxan.  To help prevent nausea and vomiting after your treatment, we encourage you to take your nausea medication. Begin taking it as often as prescribed.   If you develop nausea and vomiting that is not controlled by your nausea medication, call the clinic. If it is after clinic hours your family physician or the after hours number for the clinic or go to the Emergency Department.   BELOW ARE SYMPTOMS THAT SHOULD BE REPORTED IMMEDIATELY:  *FEVER GREATER THAN 100.5 F  *CHILLS WITH OR WITHOUT FEVER  NAUSEA AND VOMITING THAT IS NOT CONTROLLED WITH YOUR NAUSEA MEDICATION  *UNUSUAL SHORTNESS OF BREATH  *UNUSUAL BRUISING OR BLEEDING  TENDERNESS IN MOUTH AND THROAT WITH OR WITHOUT PRESENCE OF ULCERS  *URINARY PROBLEMS  *BOWEL PROBLEMS  UNUSUAL RASH Items with * indicate a potential emergency and should be followed up as soon as possible.  One of the nurses will contact you 24 hours after your treatment. Please let the nurse know about any problems that you may have experienced. Feel free to call the clinic you have any questions or concerns. The clinic phone number is [phone].   I have been informed and understand all the instructions given to me. I know to contact the clinic, my physician, or go to the Emergency Department if any problems should occur. I do not have any questions at this time, but understand that I may call the clinic during office hours   should I have any questions or need assistance in obtaining follow up care.    __________________________________________  _____________  __________ Signature of Patient or Authorized Representative            Date                   Time    __________________________________________ Nurse's Signature

## 2012-06-28 NOTE — Telephone Encounter (Signed)
Gave patient appointment for [phone] with dr.moody adjust patient's schedule for 07-19-2012 08-09-2012 08-30-2012

## 2012-06-29 ENCOUNTER — Telehealth: Payer: Self-pay | Admitting: *Deleted

## 2012-06-29 ENCOUNTER — Ambulatory Visit (HOSPITAL_BASED_OUTPATIENT_CLINIC_OR_DEPARTMENT_OTHER): Payer: Medicare Other

## 2012-06-29 VITALS — BP 130/77 | HR 62 | Temp 98.8°F

## 2012-06-29 DIAGNOSIS — C50419 Malignant neoplasm of upper-outer quadrant of unspecified female breast: Secondary | ICD-10-CM

## 2012-06-29 MED ORDER — PEGFILGRASTIM INJECTION 6 MG/0.6ML
6.0000 mg | Freq: Once | SUBCUTANEOUS | Status: AC
  Administered 2012-06-29: 6 mg via SUBCUTANEOUS
  Filled 2012-06-29: qty 0.6

## 2012-06-29 NOTE — Telephone Encounter (Signed)
Patient assessed today by flush nurse.  See infusion note for today's injection.

## 2012-06-29 NOTE — Telephone Encounter (Signed)
Message copied by Augusto Garbe on Wed Jun 29, 2012 12:14 PM ------      Message from: Sherre Poot      Created: Tue Jun 28, 2012  5:19 PM      Regarding: Chemo F/U Call       First Taxotere/Cytoxan Dr. Darnelle Catalan

## 2012-06-29 NOTE — Progress Notes (Signed)
Other than a little "figety" this am patient had no other problems from 1st time chemotherapy. Neulasta injection possible side effects explained to patient.  She is already on Claritin and will use Tylenol prn

## 2012-06-29 NOTE — Patient Instructions (Signed)
Call MD for problems 

## 2012-07-06 ENCOUNTER — Ambulatory Visit (HOSPITAL_BASED_OUTPATIENT_CLINIC_OR_DEPARTMENT_OTHER): Payer: Medicare Other | Admitting: Physician Assistant

## 2012-07-06 ENCOUNTER — Telehealth: Payer: Self-pay | Admitting: Oncology

## 2012-07-06 ENCOUNTER — Encounter: Payer: Self-pay | Admitting: Physician Assistant

## 2012-07-06 ENCOUNTER — Other Ambulatory Visit (HOSPITAL_BASED_OUTPATIENT_CLINIC_OR_DEPARTMENT_OTHER): Payer: Medicare Other | Admitting: Lab

## 2012-07-06 VITALS — BP 134/83 | HR 111 | Temp 98.2°F | Ht 66.0 in | Wt 195.9 lb

## 2012-07-06 DIAGNOSIS — C50419 Malignant neoplasm of upper-outer quadrant of unspecified female breast: Secondary | ICD-10-CM

## 2012-07-06 DIAGNOSIS — R110 Nausea: Secondary | ICD-10-CM

## 2012-07-06 DIAGNOSIS — C773 Secondary and unspecified malignant neoplasm of axilla and upper limb lymph nodes: Secondary | ICD-10-CM

## 2012-07-06 LAB — CBC WITH DIFFERENTIAL/PLATELET
BASO%: 0.7 % (ref 0.0–2.0)
Basophils Absolute: 0.1 10*3/uL (ref 0.0–0.1)
EOS%: 0.2 % (ref 0.0–7.0)
HCT: 40.4 % (ref 34.8–46.6)
HGB: 14.1 g/dL (ref 11.6–15.9)
MCH: 29 pg (ref 25.1–34.0)
MONO#: 1.1 10*3/uL — ABNORMAL HIGH (ref 0.1–0.9)
RDW: 13.7 % (ref 11.2–14.5)
WBC: 8.8 10*3/uL (ref 3.9–10.3)
lymph#: 2.6 10*3/uL (ref 0.9–3.3)

## 2012-07-06 NOTE — Telephone Encounter (Signed)
gve the pt her July revised appt calendar

## 2012-07-06 NOTE — Progress Notes (Signed)
ID: Yvonne [name]   DOB: 1946/11/01  MR#: [id number]  JYN#:[id number]  HISTORY OF PRESENT ILLNESS: The patient underwent screening mammography 03/02/2012. (Prior mammogram was 2005). Possible mass was noted in the right breast and she was recalled for additional views 03/25/2012. These showed a dense spiculated mass with overlying skin retraction in the upper outer quadrant of the right breast. There were associated coarse linear calcifications. On physical exam there was an area of dimpling in the upper outer quadrant of the right breast and a 5 cm mass was palpable. By ultrasound, this was irregular, hypoechoic, and measured 3.3 cm. In addition, an abnormal lymph node in the right axilla with the thickened cortex measured 1.8 cm.  In the opposite breast, Dr. Whitney [name] found an irregular hypoechoic mass with lobulated margins measuring 2.3 cm. The left axilla was unremarkable. Bilateral breast MRI was obtained 04/01/2012, and showed the mass in question in the right breast to measure 3.8 cm maximally, with overlying skin retraction. In addition, there was a second enhancing bilobed mass in the lower outer quadrant of the right breast, measuring 1 point centimeter. This was felt to possibly represent an intramammary lymph node.  Biopsy of the right breast and right axillary lymph node 03/25/2012 both showed ([id number])  invasive ductal carcinoma, grade 2, estrogen and progesterone receptor positive, both at 100%, with an MIB-1 39%, and no HER-2 amplification. 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 After much discussion the patient decided on right modified radical mastectomy and left lumpectomy. Her subsequent history is as detailed below.  INTERVAL HISTORY: Yvonne [name] returns today with her husband Yvonne [name] and her daughter Yvonne [name] for assessment of chemotoxicity on day 9 cycle 1 of 4 planned q. three-week doses  of docetaxel/cyclophosphamide being given in the adjuvant setting for her right breast carcinoma. Yvonne [name] tells me it has been a "horrible" week. She has really felt terrible and is slightly tearful today. She tells me she has decided not to pursue any additional chemotherapy due to the side effects. We again reviewed her antinausea regimen and it sounds like she took her medications appropriately for the first 3 days following chemotherapy. (These included dexamethasone, ondansetron, and prochlorperazine.) It was a little unclear to me, however, whether or not she has continued with any of these medications since that time.  REVIEW OF SYSTEMS: Specifically, Yvonne [name] has continued to feel nauseous, and in fact has had several episodes of emesis over the last few days. She's had a terrible headache "24/7" that has continued for the entire week. She has felt jittery and felt like she was hallucinating and was "out of her head". She had some blurred vision. She is extremely fatigued.  Fortunately, Yvonne [name] denies any fevers or chills. She's had no rashes or abnormal bleeding. She's had frequent bowel movements but denies diarrhea. No abdominal pain. No unusual myalgias or arthralgias. No peripheral swelling or signs of peripheral neuropathy. No mouth ulcers or oral sensitivity.  A detailed review of systems is otherwise noncontributory.  PAST MEDICAL HISTORY: Past Medical History  Diagnosis Date  . Elevated cholesterol     takes Crestor daily  . Hypertension     takes Maxzide daily   . Phlebitis 40yrs ago    hx of-  . Cancer     breast  . Seasonal allergies     takes Zyrtec daily and Nasal Spray prn  . Headache   . Arthritis   .  Chronic back pain     arthritis  . Bruises easily   . Hemorrhoids     PAST SURGICAL HISTORY: Past Surgical History  Procedure Date  . Appendectomy 62yrs ago  . Tubal ligation 74yrs ago  . Colonoscopy   . Portacath placement 04/26/2012    Procedure: INSERTION PORT-A-CATH;   Surgeon: Yvonne Pilot, DO;  Location: [facility] OR;  Service: General;  Laterality: Left;  Started at 1746.  . Breast surgery 2013    Right mod mastectomy  . Breast surgery 2013    Left Breast NL lumpectomy    FAMILY HISTORY Family History  Problem Relation Age of Onset  . Cancer Mother     ovarian cancer  . Cancer Sister     colon cancer  . Cancer Brother     colon cancer  . Cancer Brother     prostate cancer  . Anesthesia problems Neg Hx   . Hypotension Neg Hx   . Malignant hyperthermia Neg Hx   . Pseudochol deficiency Neg Hx   the patient's father died at the age of 36, from "leukemia." the patient's mother died at the age of 82, for what may have been uterine or ovarian cancer. The patient had 4 brothers and 2 sisters one sister has a history of colon cancer diagnosed at age 78 one brother has a history of colon cancer diagnosed at age 15 a second brother died from complications of prostate cancer. There is no other breast cancer in the family to her knowledge.  GYNECOLOGIC HISTORY: Menarche age 59, she is GX P5, first pregnancy to term age 37, last period approximately age 43. 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 She is married to Yvonne [name], who was present at her multidisciplinary breast cancer clinic visit. Yvonne [name] is disabled secondary to COPD. Yvonne [name] had 5 children from a prior marriage. There are Yvonne [name], 38, who was also present at the [facility] visit, and works for [employer]. Yvonne [name], 42, who is employed in Holiday representative, Yvonne [name] 46 who works as a Advertising copywriter, and Yvonne [name] 47 who works as a Lawyer for [employer]. The fifth child died at age 44. The patient has 13 grandchildren and 9 great grandchildren. She is not a Advice worker.   ADVANCED DIRECTIVES: not in place  HEALTH MAINTENANCE: History  Substance Use Topics  . Smoking status:  Former Games developer  . Smokeless tobacco: Never Used   Comment: quit 30+yrs ago  . Alcohol Use: No     Colonoscopy: 2007/ repeat is due  PAP: March 2012  Bone density: never  Lipid panel: per Dr. Mila Palmer  Allergies  Allergen Reactions  . Penicillins Anaphylaxis    Current Outpatient Prescriptions  Medication Sig Dispense Refill  . aspirin 81 MG tablet Take 81 mg by mouth daily.      . Calcium Carbonate-Vitamin D (CALCIUM + D PO) Take 1 tablet by mouth daily.      . celecoxib (CELEBREX) 200 MG capsule Take 200 mg by mouth daily.      . cetirizine (ZYRTEC) 10 MG chewable tablet Chew 10 mg by mouth daily.      Marland Kitchen dexamethasone (DECADRON) 4 MG tablet Take 2 tablets two times a day the day before Taxotere. Then take 2 tabs two times a day starting the day after chemo for 3 days.  30 tablet  1  . fluticasone (VERAMYST) 27.5 MCG/SPRAY nasal  spray Place 2 sprays into the nose daily.      Marland Kitchen lidocaine-prilocaine (EMLA) cream Apply topically as needed.  30 g  1  . Multiple Vitamins-Minerals (CERTAVITE SENIOR/ANTIOXIDANT) TABS Take 1 tablet by mouth.      . ondansetron (ZOFRAN) 8 MG tablet Take 1 tablet two times a day starting the day after chemo for 3 days. Then take 1 tab two times a day as needed for nausea or vomiting.  30 tablet  2  . potassium chloride SA (K-DUR,KLOR-CON) 20 MEQ tablet Take 20 mEq by mouth daily.      . prochlorperazine (COMPAZINE) 10 MG tablet Take 1 tablet (10 mg total) by mouth every 6 (six) hours as needed (Nausea or vomiting).  30 tablet  3  . rosuvastatin (CRESTOR) 20 MG tablet Take 20 mg by mouth daily.      Marland Kitchen triamterene-hydrochlorothiazide (MAXZIDE-25) 37.5-25 MG per tablet Take 1 tablet by mouth daily.        OBJECTIVE: middle-aged white woman who appears tearful and anxious Filed Vitals:   07/06/12 1121  BP: 134/83  Pulse: 111  Temp: 98.2 F (36.8 C)     Body mass index is 31.62 kg/(m^2).    ECOG FS: 1  Filed Weights   07/06/12 1121  Weight: 195 lb 14.4 oz  (88.86 kg)   Sclerae unicteric  Oropharynx clear, buccal mucosa pink and moist  No peripheral adenopathy  Lungs clear to auscultation with no rales or rhonchi  Heart regular rate and rhythm  Abd benign, nontender, positive bowel sounds  MSK no focal spinal tenderness, no peripheral edema, specifically in the right upper extremity  Neuro: nonfocal. Alert and oriented x3 Breasts: Deferred  LAB RESULTS: Lab Results  Component Value Date   WBC 8.8 07/06/2012   NEUTROABS 5.0 07/06/2012   HGB 14.1 07/06/2012   HCT 40.4 07/06/2012   MCV 83.0 07/06/2012   PLT 211 07/06/2012      Chemistry      Component Value Date/Time   NA 138 06/28/2012 1234   K 3.6 06/28/2012 1234   CL 101 06/28/2012 1234   CO2 28 06/28/2012 1234   BUN 19 06/28/2012 1234   CREATININE 0.99 06/28/2012 1234      Component Value Date/Time   CALCIUM 9.7 06/28/2012 1234   ALKPHOS 52 06/28/2012 1234   AST 21 06/28/2012 1234   ALT 24 06/28/2012 1234   BILITOT 0.4 06/28/2012 1234       Lab Results  Component Value Date   LABCA2 26 04/06/2012    STUDIES:  No recent studies.   ASSESSMENT: 66 y.o.  [place] woman  (1) status post right modified radical mastectomy 04/26/2012 for a pT2 pN1, stage IIB invasive ductal carcinoma, grade 2, estrogen and progesterone both 100% positive, with an MIB-1 of 39%, and no HER-2 amplification.   (2) status post left lumpectomy 04/26/2012, benign  (3) adjuvant cyclophosphamide/ docetaxol started 06/28/2012, to be repeated every 3 weeks x 4   PLAN:  Lallie  is feeling too poorly today to have a prolonged conversation about our plan for treatment. As of today, she tells [place] she "is finished with chemotherapy", but we will discuss this again when she returns in 2 weeks. She will see Yvonne [name] on July 30. She's currently scheduled for cycle 2 that same day, but we will adjust her schedule accordingly if she does in fact decide not to pursue additional chemotherapy.   I have encouraged Gabbrielle to keep  herself well  hydrated, and we again reviewed how to utilize her antinausea medications appropriately. Hopefully, however, her nausea will begin to resolve.  I have asked her to contact us with any changes, problems, or questions prior to her next scheduled appointment.     Ariam Mol    07/06/2012

## 2012-07-12 ENCOUNTER — Ambulatory Visit: Payer: 59

## 2012-07-12 ENCOUNTER — Other Ambulatory Visit: Payer: 59 | Admitting: Lab

## 2012-07-12 ENCOUNTER — Ambulatory Visit: Payer: Medicare Other

## 2012-07-12 ENCOUNTER — Ambulatory Visit: Payer: 59 | Admitting: Physician Assistant

## 2012-07-13 ENCOUNTER — Ambulatory Visit: Payer: 59

## 2012-07-19 ENCOUNTER — Ambulatory Visit: Payer: Medicare Other

## 2012-07-19 ENCOUNTER — Other Ambulatory Visit (HOSPITAL_BASED_OUTPATIENT_CLINIC_OR_DEPARTMENT_OTHER): Payer: Medicare Other | Admitting: Lab

## 2012-07-19 ENCOUNTER — Telehealth: Payer: Self-pay | Admitting: Oncology

## 2012-07-19 ENCOUNTER — Ambulatory Visit (HOSPITAL_BASED_OUTPATIENT_CLINIC_OR_DEPARTMENT_OTHER): Payer: Medicare Other | Admitting: Oncology

## 2012-07-19 VITALS — BP 135/77 | HR 88 | Temp 98.1°F | Ht 66.0 in | Wt 201.6 lb

## 2012-07-19 DIAGNOSIS — C50419 Malignant neoplasm of upper-outer quadrant of unspecified female breast: Secondary | ICD-10-CM

## 2012-07-19 DIAGNOSIS — Z170 Estrogen receptor positive status [ER+]: Secondary | ICD-10-CM

## 2012-07-19 DIAGNOSIS — C773 Secondary and unspecified malignant neoplasm of axilla and upper limb lymph nodes: Secondary | ICD-10-CM

## 2012-07-19 LAB — CBC WITH DIFFERENTIAL/PLATELET
Basophils Absolute: 0.1 10*3/uL (ref 0.0–0.1)
Eosinophils Absolute: 0.1 10*3/uL (ref 0.0–0.5)
HGB: 12.1 g/dL (ref 11.6–15.9)
MONO#: 0.7 10*3/uL (ref 0.1–0.9)
NEUT#: 3.5 10*3/uL (ref 1.5–6.5)
Platelets: 250 10*3/uL (ref 145–400)
RBC: 4.25 10*6/uL (ref 3.70–5.45)
RDW: 14.5 % (ref 11.2–14.5)
WBC: 7 10*3/uL (ref 3.9–10.3)
nRBC: 0 % (ref 0–0)

## 2012-07-19 MED ORDER — ANASTROZOLE 1 MG PO TABS: 1.0000 mg | ORAL_TABLET | Freq: Every day | ORAL | Status: AC

## 2012-07-19 NOTE — Progress Notes (Signed)
ID: Yvonne [name]   DOB: 09/12/1946  MR#: [id number]  AOZ#:[id number]  HISTORY OF PRESENT ILLNESS: The patient underwent screening mammography 03/02/2012. (Prior mammogram was 2005). Possible mass was noted in the right breast and she was recalled for additional views 03/25/2012. These showed a dense spiculated mass with overlying skin retraction in the upper outer quadrant of the right breast. There were associated coarse linear calcifications. On physical exam there was an area of dimpling in the upper outer quadrant of the right breast and a 5 cm mass was palpable. By ultrasound, this was irregular, hypoechoic, and measured 3.3 cm. In addition, an abnormal lymph node in the right axilla with the thickened cortex measured 1.8 cm.  In the opposite breast, Dr. Whitney Muse found an irregular hypoechoic mass with lobulated margins measuring 2.3 cm. The left axilla was unremarkable. Bilateral breast MRI was obtained 04/01/2012, and showed the mass in question in the right breast to measure 3.8 cm maximally, with overlying skin retraction. In addition, there was a second enhancing bilobed mass in the lower outer quadrant of the right breast, measuring 1 point centimeter. This was felt to possibly represent an intramammary lymph node.  Biopsy of the right breast and right axillary lymph node 03/25/2012 both showed ([id number])  invasive ductal carcinoma, grade 2, estrogen and progesterone receptor positive, both at 100%, with an MIB-1 39%, and no HER-2 amplification. 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 After much discussion the patient decided on right modified radical mastectomy and left lumpectomy. Her subsequent history is as detailed below.  INTERVAL HISTORY: Yvonne [name] returns today with her husband Fayrene Fearing and her daughter Selena Batten for followup of duties breast cancer. At the last visit here she had pretty much decided  she did not want any further chemotherapy, and I urged her to of get this some more thought and come back and talk with me today I also that she could make a fully informed decision.  REVIEW OF SYSTEMS: She tells me she still hasn't recovered from her chemotherapy. She is having trouble doing housework for example, because she still tired, especially of feeling believe we I in the morning. Her left upper arm is still uncomfortable where she received the Neulasta shot. She has had no fevers, unusual headaches, nausea or vomiting, but she has pretty much lost her hair. She continues to have joint and bone pains in various places, which are not changed from baseline. She having some headaches, but much fewer than she used to have when she was working full-time. A detailed review of systems is otherwise stable  PAST MEDICAL HISTORY: Past Medical History  Diagnosis Date  . Elevated cholesterol     takes Crestor daily  . Hypertension     takes Maxzide daily   . Phlebitis 77yrs ago    hx of-  . Cancer     breast  . Seasonal allergies     takes Zyrtec daily and Nasal Spray prn  . Headache   . Arthritis   . Chronic back pain     arthritis  . Bruises easily   . Hemorrhoids     PAST SURGICAL HISTORY: Past Surgical History  Procedure Date  . Appendectomy 83yrs ago  . Tubal ligation 38yrs ago  . Colonoscopy   . Portacath placement 04/26/2012    Procedure: INSERTION PORT-A-CATH;  Surgeon: [name], DO;  Location: [facility] OR;  Service: General;  Laterality: Left;  Started  at 1746.  . Breast surgery 2013    Right mod mastectomy  . Breast surgery 2013    Left Breast NL lumpectomy    FAMILY HISTORY Family History  Problem Relation Age of Onset  . Cancer Mother     ovarian cancer  . Cancer Sister     colon cancer  . Cancer Brother     colon cancer  . Cancer Brother     prostate cancer  . Anesthesia problems Neg Hx   . Hypotension Neg Hx   . Malignant hyperthermia Neg Hx   . Pseudochol  deficiency Neg Hx   the patient's father died at the age of 48, from "leukemia." the patient's mother died at the age of 13, for what may have been uterine or ovarian cancer. The patient had 4 brothers and 2 sisters one sister has a history of colon cancer diagnosed at age 71 one brother has a history of colon cancer diagnosed at age 53 a second brother died from complications of prostate cancer. There is no other breast cancer in the family to her knowledge.  GYNECOLOGIC HISTORY: Menarche age 89, she is GX P5, first pregnancy to term age 75, last period approximately age 43. 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 She is married to [facility], who was present at her multidisciplinary breast cancer clinic visit. Fayrene Fearing is disabled secondary to COPD. Bexleigh had 5 children from a prior marriage. There are Annabell Sabal, 38, who was also present at the [facility] visit, and works for [employer]. Gershon Crane, 42, who is employed in Holiday representative, Hedwig Morton 46 who works as a Advertising copywriter, and Sports administrator 47 who works as a Lawyer for [employer]. The fifth child died at age 42. The patient has 13 grandchildren and 9 great grandchildren. She is not a Advice worker.   ADVANCED DIRECTIVES: not in place  HEALTH MAINTENANCE: History  Substance Use Topics  . Smoking status: Former Games developer  . Smokeless tobacco: Never Used   Comment: quit 30+yrs ago  . Alcohol Use: No     Colonoscopy: 2007/ repeat is due  PAP: March 2012  Bone density: never  Lipid panel: per Dr. Mila Palmer  Allergies  Allergen Reactions  . Penicillins Anaphylaxis    Current Outpatient Prescriptions  Medication Sig Dispense Refill  . aspirin 81 MG tablet Take 81 mg by mouth daily.      . Calcium Carbonate-Vitamin D (CALCIUM + D PO) Take 1 tablet by mouth daily.      . celecoxib (CELEBREX) 200 MG capsule Take 200 mg  by mouth daily.      . cetirizine (ZYRTEC) 10 MG chewable tablet Chew 10 mg by mouth daily.      . fluticasone (VERAMYST) 27.5 MCG/SPRAY nasal spray Place 2 sprays into the nose daily.      Marland Kitchen lidocaine-prilocaine (EMLA) cream Apply topically as needed.  30 g  1  . Multiple Vitamins-Minerals (CERTAVITE SENIOR/ANTIOXIDANT) TABS Take 1 tablet by mouth.      . potassium chloride SA (K-DUR,KLOR-CON) 20 MEQ tablet Take 20 mEq by mouth daily.      . rosuvastatin (CRESTOR) 20 MG tablet Take 20 mg by mouth daily.      Marland Kitchen triamterene-hydrochlorothiazide (MAXZIDE-25) 37.5-25 MG per tablet Take 1 tablet by mouth daily.      Marland Kitchen dexamethasone (DECADRON) 4 MG tablet Take 2 tablets two times a day the  day before Taxotere. Then take 2 tabs two times a day starting the day after chemo for 3 days.  30 tablet  1  . ondansetron (ZOFRAN) 8 MG tablet Take 1 tablet two times a day starting the day after chemo for 3 days. Then take 1 tab two times a day as needed for nausea or vomiting.  30 tablet  2  . prochlorperazine (COMPAZINE) 10 MG tablet Take 1 tablet (10 mg total) by mouth every 6 (six) hours as needed (Nausea or vomiting).  30 tablet  3    OBJECTIVE: middle-aged white woman who appears slightly fatigued Filed Vitals:   07/19/12 1353  BP: 135/77  Pulse: 88  Temp: 98.1 F (36.7 C)     Body mass index is 32.54 kg/(m^2).    ECOG FS: 1  Filed Weights   07/19/12 1353  Weight: 201 lb 9.6 oz (91.445 kg)   Sclerae unicteric  Oropharynx clear, buccal mucosa pink and moist  No peripheral adenopathy  Lungs clear to auscultation with no rales or rhonchi  Heart regular rate and rhythm  Abd benign, nontender, positive bowel sounds  MSK no focal spinal tenderness, no peripheral edema, specifically in the right upper extremity  Neuro: nonfocal. Alert and oriented x3 Breasts: The right breast is status post mastectomy. There is no evidence of local recurrence. The left breast is unremarkable.  LAB RESULTS: Lab  Results  Component Value Date   WBC 7.0 07/19/2012   NEUTROABS 3.5 07/19/2012   HGB 12.1 07/19/2012   HCT 35.8 07/19/2012   MCV 84.2 07/19/2012   PLT 250 07/19/2012      Chemistry      Component Value Date/Time   NA 138 06/28/2012 1234   K 3.6 06/28/2012 1234   CL 101 06/28/2012 1234   CO2 28 06/28/2012 1234   BUN 19 06/28/2012 1234   CREATININE 0.99 06/28/2012 1234      Component Value Date/Time   CALCIUM 9.7 06/28/2012 1234   ALKPHOS 52 06/28/2012 1234   AST 21 06/28/2012 1234   ALT 24 06/28/2012 1234   BILITOT 0.4 06/28/2012 1234       Lab Results  Component Value Date   LABCA2 26 04/06/2012    STUDIES:  No recent studies.   ASSESSMENT: 66 y.o.  [place] woman  (1) status post right modified radical mastectomy 04/26/2012 for a pT2 pN1, stage IIB invasive ductal carcinoma, grade 2, estrogen and progesterone both 100% positive, with an MIB-1 of 39%, and no HER-2 amplification.   (2) status post left lumpectomy 04/26/2012, benign  (3) adjuvant cyclophosphamide/ docetaxol given 06/28/2012, the patient refusing further chemotherapy because of side effects.  (4) anastrozole started 07/21/2012  PLAN:  We spent the larger part of today's visit discussing Yvonne [name]'s risk profile. She really had not wanted to get into numbers the first visit, so I do up to the adjuvant! Profile and this suggests a 55% risk of relapse if all she does is surgery and radiation. If she took antiestrogen therapy that would drop the risk by 24%, leaving her with a 31% risk. Chemotherapy would drop that risk another 7%.   She does not feel a decrease in the risk of recurrence of 7% warrants chemotherapy, much less of the side effects that she underwent. She has prayed about this decision and is very firm about it.  She also told me she has decided against radiation, but I urged her to keep her appointment with Dr. Mitzi Hansen September 4. She is  again informed regarding the possible benefits of radiation before she makes a  definitive decision that she does not want to have it. I reassured her that we would honor her choice and that she "is the boss" as far as receiving treatment or not is concerned.  We then went over the possible toxicities side effects and complications of anastrozole. She is going to take this medication starting now and return to see [place] in about 2 months. If she is tolerating it well, the plan will be to continue that for 5 years. Although she previously told me she had never had a bone density, today she told her she had a bone density through her primary care physician about 2 years ago. We will try to get that information before her next visit here. She knows to call for any problems that she may develop from the anastrozole.    MAGRINAT,GUSTAV C    07/19/2012

## 2012-07-19 NOTE — Telephone Encounter (Signed)
gv pt appt schedule for September. Per 7/30 pof all tx, associated lbs and day 2 inj to be cx'd. New order given for 55mo f/u. Old f/u appts on schedule for 8/20 and 9/10 cx'd.

## 2012-07-20 ENCOUNTER — Telehealth: Payer: Self-pay | Admitting: Oncology

## 2012-07-20 ENCOUNTER — Ambulatory Visit: Payer: Self-pay

## 2012-07-20 NOTE — Telephone Encounter (Signed)
Letter sent to pt . And Dr. Fleet Contras office.

## 2012-08-02 ENCOUNTER — Ambulatory Visit: Payer: 59

## 2012-08-02 ENCOUNTER — Ambulatory Visit: Payer: 59 | Admitting: Oncology

## 2012-08-02 ENCOUNTER — Other Ambulatory Visit: Payer: 59 | Admitting: Lab

## 2012-08-03 ENCOUNTER — Ambulatory Visit: Payer: 59

## 2012-08-03 ENCOUNTER — Encounter (INDEPENDENT_AMBULATORY_CARE_PROVIDER_SITE_OTHER): Payer: Medicare Other | Admitting: General Surgery

## 2012-08-05 ENCOUNTER — Telehealth (INDEPENDENT_AMBULATORY_CARE_PROVIDER_SITE_OTHER): Payer: Self-pay | Admitting: General Surgery

## 2012-08-05 ENCOUNTER — Ambulatory Visit (INDEPENDENT_AMBULATORY_CARE_PROVIDER_SITE_OTHER): Payer: Medicare Other | Admitting: General Surgery

## 2012-08-05 ENCOUNTER — Encounter (INDEPENDENT_AMBULATORY_CARE_PROVIDER_SITE_OTHER): Payer: Self-pay | Admitting: General Surgery

## 2012-08-05 VITALS — BP 162/94 | HR 76 | Temp 97.4°F | Resp 18 | Ht 66.0 in | Wt 205.2 lb

## 2012-08-05 DIAGNOSIS — Z853 Personal history of malignant neoplasm of breast: Secondary | ICD-10-CM

## 2012-08-05 NOTE — Progress Notes (Signed)
Subjective:     Patient ID: Yvonne [name], female   DOB: 1946-08-07, 66 y.o.   MRN: [id number]  HPI This patient follows up status post right modified radical mastectomy and needle localized left breast lumpectomy in May of 2000 for a T2 N1 right breast cancer. She started her chemotherapy regimen but did not tolerate this well and she is now only taking hormones. She has not seen the radiation oncologist. She denies any other changes or suspicious masses. Her arm swelling has gone down and she has full range of motion. Otherwise no complaints and no systemic symptoms.  Review of Systems     Objective:   Physical Exam Her wounds are healed and no sign of infection. There is no evidence of recurrent tumor on exam in the right. Her left breast is normal as well without any suspicious masses or lymphadenopathy.    Assessment:     History of right breast cancer She seems to be doing well from her surgery and has recovered fairly well. She did not complete her chemotherapy as recommended but is currently to hormones. She has still not seen a radiation oncologist for his recommendations. I again recommended that she discuss postoperative radiation with him to see if he could add any benefit for her. Otherwise she seems to be doing well and has no evidence of recurrent disease at this time. Recommended that she have a left mammogram in November which would be about 6 months from her prior left breast biopsy and we will see her back at that time for repeat evaluation.    Plan:     Consultation with radiation oncology Continue monthly self breast exam and followup in November after left mammogram

## 2012-08-05 NOTE — Telephone Encounter (Signed)
Spoke with pt and informed her that [facility] 07, 2012 at 8:15.  She was fine with this.

## 2012-08-09 ENCOUNTER — Ambulatory Visit: Payer: Medicare Other

## 2012-08-09 ENCOUNTER — Telehealth: Payer: Self-pay | Admitting: Genetic Counselor

## 2012-08-09 ENCOUNTER — Other Ambulatory Visit: Payer: Medicare Other | Admitting: Lab

## 2012-08-09 ENCOUNTER — Encounter: Payer: Self-pay | Admitting: Genetic Counselor

## 2012-08-09 NOTE — Telephone Encounter (Signed)
Revealed negative CancerNExt testing

## 2012-08-24 ENCOUNTER — Ambulatory Visit: Payer: Medicare Other

## 2012-08-24 ENCOUNTER — Encounter: Payer: Self-pay | Admitting: Radiation Oncology

## 2012-08-24 ENCOUNTER — Ambulatory Visit
Admission: RE | Admit: 2012-08-24 | Discharge: 2012-08-24 | Disposition: A | Discharge status: Home / Self Care | Payer: Medicare Other | Source: Ambulatory Visit | Attending: Radiation Oncology | Admitting: Radiation Oncology

## 2012-08-24 ENCOUNTER — Ambulatory Visit: Payer: Medicare Other | Admitting: Radiation Oncology

## 2012-08-24 VITALS — BP 146/85 | HR 73 | Temp 98.4°F | Resp 20 | Ht 66.0 in | Wt 206.2 lb

## 2012-08-24 DIAGNOSIS — C50419 Malignant neoplasm of upper-outer quadrant of unspecified female breast: Secondary | ICD-10-CM

## 2012-08-24 DIAGNOSIS — C801 Malignant (primary) neoplasm, unspecified: Secondary | ICD-10-CM | POA: Insufficient documentation

## 2012-08-24 DIAGNOSIS — C50919 Malignant neoplasm of unspecified site of unspecified female breast: Secondary | ICD-10-CM | POA: Insufficient documentation

## 2012-08-24 HISTORY — DX: Personal history of antineoplastic chemotherapy: Z92.21

## 2012-08-24 HISTORY — DX: Malignant neoplasm of unspecified site of unspecified female breast: C50.919

## 2012-08-24 NOTE — Progress Notes (Signed)
Please see the Nurse Progress Note in the MD Initial Consult Encounter for this patient. 

## 2012-08-24 NOTE — Progress Notes (Signed)
Pt states she had one chemo treatment and could not tolerate, is taking Anastrozole. Pt denies pain, does have post-op soreness of right chest wall. Pt states she does not plan to take radiation; encouraged her to let dr explain radiation role for her case, stating that dr has information re: her mastectomy results. Pt verbalized understanding.

## 2012-08-24 NOTE — Progress Notes (Signed)
The patient was seen briefly today in the clinic. I actually spoke to the patient and who I believe is her husband in the hallway. They were in the process of leaving shortly after they had been put in a clinic room. By report, the patient and especially her husband were upset at being in clinic today. She stated that she had indicated to other physicians that she did not want radiation treatment and they did not know why they needed to see me today. I did indicate to the patient that radiation would be my recommendation for her and this is based on her surgical results and initial presentation. I offered to more fully review this information with her and the rationale for treatment. She declined this opportunity to discuss this further.  The patient did verbalize an understanding of the recommendation to proceed with radiation treatment. She was asked to let us know if we can be of any further assistance for her case and she was in agreement with this.

## 2012-08-30 ENCOUNTER — Ambulatory Visit: Payer: Medicare Other | Admitting: Physician Assistant

## 2012-08-30 ENCOUNTER — Ambulatory Visit: Payer: Medicare Other

## 2012-08-30 ENCOUNTER — Other Ambulatory Visit: Payer: Medicare Other | Admitting: Lab

## 2012-09-13 ENCOUNTER — Encounter: Payer: Self-pay | Admitting: Physician Assistant

## 2012-09-13 ENCOUNTER — Telehealth: Payer: Self-pay | Admitting: *Deleted

## 2012-09-13 ENCOUNTER — Ambulatory Visit (HOSPITAL_BASED_OUTPATIENT_CLINIC_OR_DEPARTMENT_OTHER): Payer: Medicare Other | Admitting: Physician Assistant

## 2012-09-13 VITALS — BP 144/85 | HR 85 | Temp 98.9°F | Resp 20 | Ht 66.0 in | Wt 210.3 lb

## 2012-09-13 DIAGNOSIS — M899 Disorder of bone, unspecified: Secondary | ICD-10-CM

## 2012-09-13 DIAGNOSIS — M8580 Other specified disorders of bone density and structure, unspecified site: Secondary | ICD-10-CM

## 2012-09-13 DIAGNOSIS — Z170 Estrogen receptor positive status [ER+]: Secondary | ICD-10-CM

## 2012-09-13 DIAGNOSIS — C50419 Malignant neoplasm of upper-outer quadrant of unspecified female breast: Secondary | ICD-10-CM

## 2012-09-13 NOTE — Patient Instructions (Signed)
Continue anastrazole daily.  Refer back to Dr. Biagio Quint for PORT removal, next available appointment.  Mammo as scheduled on 11/07/2012.  Return in 6 months for labs and follow up visit (March 2014)  Take Calcium + D supplement and walk daily to help bone strength.  Call with any questions or problems.  ([phone])

## 2012-09-13 NOTE — Telephone Encounter (Signed)
Dr. Biagio Quint, next available for port removal; labs in March 2014, 1 week before seeing GM  Called [facility] to inform them of the order for the port removal   Faxed over referral to the lymdema clinic

## 2012-09-13 NOTE — Progress Notes (Signed)
Yvonne [name]   DOB: [birth year]  MR#: [id number]  WUJ#:[id number]  HISTORY OF PRESENT ILLNESS: The patient underwent screening mammography 03/02/2012. (Prior mammogram was 2005). Possible mass was noted in the right breast and she was recalled for additional views 03/25/2012. These showed a dense spiculated mass with overlying skin retraction in the upper outer quadrant of the right breast. There were associated coarse linear calcifications. On physical exam there was an area of dimpling in the upper outer quadrant of the right breast and a 5 cm mass was palpable. By ultrasound, this was irregular, hypoechoic, and measured 3.3 cm. In addition, an abnormal lymph node in the right axilla with the thickened cortex measured 1.8 cm.  In the opposite breast, Dr. Whitney Muse found an irregular hypoechoic mass with lobulated margins measuring 2.3 cm. The left axilla was unremarkable. Bilateral breast MRI was obtained 04/01/2012, and showed the mass in question in the right breast to measure 3.8 cm maximally, with overlying skin retraction. In addition, there was a second enhancing bilobed mass in the lower outer quadrant of the right breast, measuring 1 point centimeter. This was felt to possibly represent an intramammary lymph node.  Biopsy of the right breast and right axillary lymph node 03/25/2012 both showed ([id number])  invasive ductal carcinoma, grade 2, estrogen and progesterone receptor positive, both at 100%, with an MIB-1 39%, and no HER-2 amplification. 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 After much discussion the patient decided on right modified radical mastectomy and left lumpectomy. Her subsequent history is as detailed below.  INTERVAL HISTORY: Yvonne [name] returns today with her husband Yvonne [name] for followup of her right breast carcinoma. She continues on anastrozole daily which she is tolerating well. It  is causing some hot flashes but she tells me are tolerable. She has some chronic arthritic pain, especially affecting the right knee, but this has not worsened since beginning the medication.  Interval history is remarkable for [place] having met with Dr. Mitzi Hansen regarding possible radiation therapy. She has declined any additional therapy.   REVIEW OF SYSTEMS: Yvonne [name] tells me she is feeling better today, and has almost recovered from the chemotherapy. Her energy level is improving. She's had no recent illnesses and denies fevers or chills. No skin changes. No signs of abnormal bleeding. She's eating and drinking well no nausea or change in bowel habits. No increased shortness of breath. She has an occasional cough associated with postnasal drip and seasonal allergies. No chest pain or palpitations. No abnormal headaches or dizziness. She has some occasional swelling in the right upper extremity.  A detailed review of systems is otherwise stable and noncontributory.    PAST MEDICAL HISTORY: Past Medical History  Diagnosis Date  . Elevated cholesterol     takes Crestor daily  . Hypertension     takes Maxzide daily   . Phlebitis 79yrs ago    hx of-  . Cancer 03/25/12    right breast  . Seasonal allergies     takes Zyrtec daily and Nasal Spray prn  . Headache   . Arthritis   . Chronic back pain     arthritis  . Bruises easily   . Hemorrhoids   . Breast cancer 04/2012  . History of chemotherapy     x 1, pt unable to tolerate, on PO chemotherpay    PAST SURGICAL HISTORY: Past Surgical History  Procedure Date  . Appendectomy 67yrs ago  .  Tubal ligation 72yrs ago  . Colonoscopy   . Portacath placement 04/26/2012    Procedure: INSERTION PORT-A-CATH;  Surgeon: [name], DO;  Location: [facility] OR;  Service: General;  Laterality: Left;  Started at 1746.  . Breast surgery 04/27/12    Right mod mastectomy, ER/PR +, HER2 -  . Breast surgery 04/27/12    Left Breast NL lumpectomy-no malignancy     FAMILY HISTORY Family History  Problem Relation Age of Onset  . Cancer Mother     ovarian cancer  . Cancer Sister 22    colon cancer  . Cancer Brother 35    colon cancer  . Cancer Brother     prostate cancer, deceased  . Anesthesia problems Neg Hx   . Hypotension Neg Hx   . Malignant hyperthermia Neg Hx   . Pseudochol deficiency Neg Hx   . Leukemia Father   . Cancer Father     leukemia  the patient's father died at the age of 73, from "leukemia." the patient's mother died at the age of 20, for what may have been uterine or ovarian cancer. The patient had 4 brothers and 2 sisters one sister has a history of colon cancer diagnosed at age 80 one brother has a history of colon cancer diagnosed at age 55 a second brother died from complications of prostate cancer. There is no other breast cancer in the family to her knowledge.  GYNECOLOGIC HISTORY: Menarche age 48, she is GX P5, first pregnancy to term age 89, last period approximately age 4. 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 She is married to [facility], who was present at her multidisciplinary breast cancer clinic visit. Yvonne [name] is disabled secondary to COPD. Teralyn had 5 children from a prior marriage. There are Yvonne [name], 38, who was also present at the [facility] visit, and works for [employer]. Yvonne [name], 42, who is employed in Holiday representative, Yvonne [name] 46 who works as a Advertising copywriter, and Sports administrator 47 who works as a Lawyer for [employer]. The fifth child died at age 69. The patient has 13 grandchildren and 9 great grandchildren. She is not a Advice worker.   ADVANCED DIRECTIVES: not in place  HEALTH MAINTENANCE: History  Substance Use Topics  . Smoking status: Former Games developer  . Smokeless tobacco: Never Used   Comment: quit 30+yrs ago  . Alcohol Use: No     Colonoscopy: 2007/ repeat is  due  PAP: March 2012  Bone density: never  Lipid panel: per Dr. Mila Palmer  Allergies  Allergen Reactions  . Penicillins Anaphylaxis    Current Outpatient Prescriptions  Medication Sig Dispense Refill  . anastrozole (ARIMIDEX) 1 MG tablet Take 1 mg by mouth daily.      Marland Kitchen aspirin 81 MG tablet Take 81 mg by mouth daily.      . Calcium Carbonate-Vitamin D (CALCIUM + D PO) Take 1 tablet by mouth daily.      . celecoxib (CELEBREX) 200 MG capsule Take 200 mg by mouth daily.      . cetirizine (ZYRTEC) 10 MG chewable tablet Chew 10 mg by mouth daily.      . fluticasone (VERAMYST) 27.5 MCG/SPRAY nasal spray Place 2 sprays into the nose daily.      Marland Kitchen lidocaine-prilocaine (EMLA) cream Apply topically as needed.  30 g  1  . Multiple Vitamins-Minerals (CERTAVITE SENIOR/ANTIOXIDANT) TABS Take 1 tablet by mouth.      Marland Kitchen  potassium chloride SA (K-DUR,KLOR-CON) 20 MEQ tablet Take 20 mEq by mouth daily.      . rosuvastatin (CRESTOR) 20 MG tablet Take 20 mg by mouth daily.      Marland Kitchen triamterene-hydrochlorothiazide (MAXZIDE-25) 37.5-25 MG per tablet Take 1 tablet by mouth daily.        OBJECTIVE: middle-aged white woman who appears comfortable and in no acute distress Filed Vitals:   09/13/12 1312  BP: 144/85  Pulse: 85  Temp: 98.9 F (37.2 C)  Resp: 20     Body mass index is 33.94 kg/(m^2).    ECOG FS: 1  Filed Weights   09/13/12 1312  Weight: 210 lb 4.8 oz (95.391 kg)   Sclerae unicteric  Oropharynx clear No peripheral adenopathy  Lungs clear to auscultation with no rales or rhonchi  Heart regular rate and rhythm  Abd benign, nontender, positive bowel sounds  MSK no focal spinal tenderness, no peripheral edema with the exception of mild, nonpitting edema in the upper portion of the right arm  Neuro: nonfocal. Alert and oriented x3 Breasts: The right breast is status post mastectomy. There is no evidence of local recurrence. The left breast is unremarkable. Port is intact in the left upper chest  wall with no erythema or edema.  LAB RESULTS: Lab Results  Component Value Date   WBC 7.0 07/19/2012   NEUTROABS 3.5 07/19/2012   HGB 12.1 07/19/2012   HCT 35.8 07/19/2012   MCV 84.2 07/19/2012   PLT 250 07/19/2012      Chemistry      Component Value Date/Time   NA 138 06/28/2012 1234   K 3.6 06/28/2012 1234   CL 101 06/28/2012 1234   CO2 28 06/28/2012 1234   BUN 19 06/28/2012 1234   CREATININE 0.99 06/28/2012 1234      Component Value Date/Time   CALCIUM 9.7 06/28/2012 1234   ALKPHOS 52 06/28/2012 1234   AST 21 06/28/2012 1234   ALT 24 06/28/2012 1234   BILITOT 0.4 06/28/2012 1234       Lab Results  Component Value Date   LABCA2 26 04/06/2012    STUDIES:  Most recent bone density exam was obtained on 08/15/2012 at [facility], showing osteopenia.  [facility] mamm[facility] for [facility]07/2012.   ASSESSMENT: 66 y.o.  Early woman  (1) status post right modified radical mastectomy 04/26/2012 for a pT2 pN1, stage IIB invasive ductal carcinoma, grade 2, estrogen and progesterone both 100% positive, with an MIB-1 of 39%, and no HER-2 amplification.   (2) status post left lumpectomy 04/26/2012, benign  (3) adjuvant cyclophosphamide/ docetaxol given 06/28/2012, the patient refusing further chemotherapy because of side effects.  Declined radiation therapy.  (4) anastrozole started 07/21/2012  PLAN:  Deedra will continue on the anastrozole daily. I encouraged Brunette to continue taking calcium plus vitamin D supplements, and to walk daily to prevent development of osteoporosis.  She is already scheduled for her left mammogram in November and will keep that appointment as planned. She will return to see [place] in 6 months for repeat labs and physical exam. In the meanwhile, I am also referring her back to Dr. Biagio Quint to have her port removed. We did flush it today while she was here in the office, with no complications.  I am also referring Kinzli to the lymphedema clinic for  further evaluation of mild lymphedema in the right upper extremity.  Cheresa voices understanding and agreement with our plan, and will call with  any changes or problems.    Carroll Lingelbach    09/13/2012

## 2012-09-13 NOTE — Addendum Note (Signed)
Encounter addended by: Tessa Lerner, RN on: 09/13/2012 11:01 AM<BR>     Documentation filed: Charges VN

## 2012-09-15 ENCOUNTER — Ambulatory Visit: Payer: Medicare Other | Attending: Physician Assistant | Admitting: Physical Therapy

## 2012-09-15 DIAGNOSIS — C50919 Malignant neoplasm of unspecified site of unspecified female breast: Secondary | ICD-10-CM | POA: Insufficient documentation

## 2012-09-15 DIAGNOSIS — I890 Lymphedema, not elsewhere classified: Secondary | ICD-10-CM | POA: Insufficient documentation

## 2012-09-15 DIAGNOSIS — IMO0001 Reserved for inherently not codable concepts without codable children: Secondary | ICD-10-CM | POA: Insufficient documentation

## 2012-09-19 ENCOUNTER — Ambulatory Visit: Payer: Medicare Other | Admitting: Physical Therapy

## 2012-09-19 NOTE — Addendum Note (Signed)
Encounter addended by: Shernell Saldierna Mintz Hilarie Sinha, RN on: 09/19/2012  8:14 PM<BR>     Documentation filed: Charges VN

## 2012-09-28 ENCOUNTER — Ambulatory Visit: Payer: Medicare Other

## 2012-10-27 ENCOUNTER — Encounter (INDEPENDENT_AMBULATORY_CARE_PROVIDER_SITE_OTHER): Payer: Self-pay | Admitting: General Surgery

## 2012-10-27 ENCOUNTER — Ambulatory Visit (INDEPENDENT_AMBULATORY_CARE_PROVIDER_SITE_OTHER): Payer: Medicare Other | Admitting: General Surgery

## 2012-10-27 VITALS — BP 144/88 | HR 70 | Temp 97.4°F | Resp 16 | Ht 66.0 in | Wt 211.8 lb

## 2012-10-27 DIAGNOSIS — Z853 Personal history of malignant neoplasm of breast: Secondary | ICD-10-CM

## 2012-10-27 NOTE — Progress Notes (Signed)
Subjective:     Patient ID: Yvonne [name], female   DOB: 09/26/1946, 66 y.o.   MRN: [id number]  HPI This patient follows up status post right modified radical mastectomy for a T2, N1, M0 right breast cancer. She has been doing well and is currently on hormonal therapy. She only had one round of her chemotherapy because this was not tolerated well. She still has some slight upper arm swelling on the right. She does not wear any compression hose on this side because of the cost. She says that she is feeling well and denies any systemic symptoms. She says that she is doing her self breast exam and has not noticed any changes. She is requesting that her port be removed  Review of Systems     Objective:   Physical Exam No acute distress and nontoxic-appearing Her right mastectomy incision has some nodularity on the medial aspect of the incision but there is no suspicious masses or sign of recurrence. There is no suspicious lymphadenopathy. Her incision is well-healed. I do not appreciate any significant lymphedema on exam today. Her left breast is normal exam. Her lumpectomy incision is well healed and there is no sign of any recurrent masses in this area.    Assessment:     History of right breast cancer-doing well I think it is reasonable to remove her Mediport however she will be high risk given her nodal disease and her inability to tolerate chemotherapy. She is on hormonal treatment which would certainly help and I agree with this. We will go ahead and set her up for Mediport removal after her followup mammogram next week. If this is normal without any evidence of suspicious lesions, then we will go ahead and set her up for port removal.    Plan:     Continue monthly self breast exams and I'll see her back in about 6 months for repeat exam. She will get her mammogram next week and if this is normal we will go ahead and plan for port removal      

## 2012-11-07 ENCOUNTER — Ambulatory Visit
Admission: RE | Admit: 2012-11-07 | Discharge: 2012-11-07 | Disposition: A | Discharge status: Home / Self Care | Payer: Medicare Other | Source: Ambulatory Visit | Attending: General Surgery | Admitting: General Surgery

## 2012-11-07 DIAGNOSIS — Z853 Personal history of malignant neoplasm of breast: Secondary | ICD-10-CM

## 2012-11-11 ENCOUNTER — Telehealth: Payer: Self-pay | Admitting: *Deleted

## 2012-11-11 NOTE — Telephone Encounter (Signed)
Lea from [employer] called to let us know that they will be sending her information to [facility].  They did not realize that this was a separate pharmacy, so they will be sending them her information.

## 2012-11-22 ENCOUNTER — Encounter (HOSPITAL_BASED_OUTPATIENT_CLINIC_OR_DEPARTMENT_OTHER): Payer: Self-pay | Admitting: *Deleted

## 2012-11-22 ENCOUNTER — Encounter (HOSPITAL_BASED_OUTPATIENT_CLINIC_OR_DEPARTMENT_OTHER)
Admission: RE | Admit: 2012-11-22 | Discharge: 2012-11-22 | Disposition: A | Discharge status: Home / Self Care | Payer: Medicare Other | Source: Ambulatory Visit | Attending: General Surgery | Admitting: General Surgery

## 2012-11-22 LAB — BASIC METABOLIC PANEL
CO2: 33 mEq/L — ABNORMAL HIGH (ref 19–32)
Calcium: 10.2 mg/dL (ref 8.4–10.5)
GFR calc non Af Amer: 59 mL/min — ABNORMAL LOW (ref >90)
Potassium: 3.7 mEq/L (ref 3.5–5.1)
Sodium: 140 mEq/L (ref 135–145)

## 2012-11-24 ENCOUNTER — Ambulatory Visit (HOSPITAL_BASED_OUTPATIENT_CLINIC_OR_DEPARTMENT_OTHER): Payer: Medicare Other | Admitting: Anesthesiology

## 2012-11-24 ENCOUNTER — Encounter (HOSPITAL_BASED_OUTPATIENT_CLINIC_OR_DEPARTMENT_OTHER): Payer: Self-pay | Admitting: Anesthesiology

## 2012-11-24 ENCOUNTER — Encounter (HOSPITAL_BASED_OUTPATIENT_CLINIC_OR_DEPARTMENT_OTHER): Payer: Self-pay | Admitting: *Deleted

## 2012-11-24 ENCOUNTER — Ambulatory Visit (HOSPITAL_BASED_OUTPATIENT_CLINIC_OR_DEPARTMENT_OTHER)
Admission: RE | Admit: 2012-11-24 | Discharge: 2012-11-24 | Disposition: A | Discharge status: Home / Self Care | Payer: Medicare Other | Source: Ambulatory Visit | Attending: General Surgery | Admitting: General Surgery

## 2012-11-24 ENCOUNTER — Encounter (HOSPITAL_BASED_OUTPATIENT_CLINIC_OR_DEPARTMENT_OTHER)
Admission: RE | Disposition: A | Discharge status: Home / Self Care | Payer: Self-pay | Source: Ambulatory Visit | Attending: General Surgery

## 2012-11-24 DIAGNOSIS — Z452 Encounter for adjustment and management of vascular access device: Secondary | ICD-10-CM

## 2012-11-24 DIAGNOSIS — I10 Essential (primary) hypertension: Secondary | ICD-10-CM | POA: Insufficient documentation

## 2012-11-24 DIAGNOSIS — Z853 Personal history of malignant neoplasm of breast: Secondary | ICD-10-CM | POA: Insufficient documentation

## 2012-11-24 DIAGNOSIS — Z9010 Acquired absence of unspecified breast and nipple: Secondary | ICD-10-CM | POA: Insufficient documentation

## 2012-11-24 HISTORY — PX: PORT-A-CATH REMOVAL: SHX5289

## 2012-11-24 SURGERY — REMOVAL PORT-A-CATH
Anesthesia: General | Site: Chest | Laterality: Left | Wound class: Clean

## 2012-11-24 MED ORDER — LIDOCAINE-EPINEPHRINE (PF) 1 %-1:200000 IJ SOLN
INTRAMUSCULAR | Status: DC | PRN
  Administered 2012-11-24: 08:00:00 via INTRAMUSCULAR

## 2012-11-24 MED ORDER — OXYCODONE HCL 5 MG PO TABS: 5.0000 mg | ORAL_TABLET | Freq: Once | ORAL | Status: DC | PRN

## 2012-11-24 MED ORDER — LIDOCAINE HCL (CARDIAC) 20 MG/ML IV SOLN
INTRAVENOUS | Status: DC | PRN
  Administered 2012-11-24: 50 mg via INTRAVENOUS

## 2012-11-24 MED ORDER — MIDAZOLAM HCL 2 MG/2ML IJ SOLN: 1.0000 mg | INTRAMUSCULAR | Status: DC | PRN

## 2012-11-24 MED ORDER — PROPOFOL 10 MG/ML IV BOLUS
INTRAVENOUS | Status: DC | PRN
  Administered 2012-11-24: 200 mg via INTRAVENOUS

## 2012-11-24 MED ORDER — LACTATED RINGERS IV SOLN
INTRAVENOUS | Status: DC
  Administered 2012-11-24: 07:00:00 via INTRAVENOUS

## 2012-11-24 MED ORDER — FENTANYL CITRATE 0.05 MG/ML IJ SOLN: 25.0000 ug | INTRAMUSCULAR | Status: DC | PRN

## 2012-11-24 MED ORDER — OXYCODONE HCL 5 MG/5ML PO SOLN: 5.0000 mg | Freq: Once | ORAL | Status: DC | PRN

## 2012-11-24 MED ORDER — FENTANYL CITRATE 0.05 MG/ML IJ SOLN
50.0000 ug | INTRAMUSCULAR | Status: DC | PRN
  Administered 2012-11-24: 25 ug via INTRAVENOUS
  Administered 2012-11-24: 50 ug via INTRAVENOUS

## 2012-11-24 MED ORDER — DEXAMETHASONE SODIUM PHOSPHATE 4 MG/ML IJ SOLN
INTRAMUSCULAR | Status: DC | PRN
  Administered 2012-11-24: 8 mg via INTRAVENOUS

## 2012-11-24 MED ORDER — HYDROCODONE-ACETAMINOPHEN 5-325 MG PO TABS: 1.0000 | ORAL_TABLET | ORAL | Status: DC | PRN

## 2012-11-24 SURGICAL SUPPLY — 35 items
BENZOIN TINCTURE PRP APPL 2/3 (GAUZE/BANDAGES/DRESSINGS) IMPLANT
BLADE SURG 15 STRL LF DISP TIS (BLADE) ×1 IMPLANT
BLADE SURG 15 STRL SS (BLADE) ×1
CHLORAPREP W/TINT 26ML (MISCELLANEOUS) ×2 IMPLANT
CLOTH BEACON ORANGE TIMEOUT ST (SAFETY) ×2 IMPLANT
COVER MAYO STAND STRL (DRAPES) ×2 IMPLANT
COVER TABLE BACK 60X90 (DRAPES) ×2 IMPLANT
DECANTER SPIKE VIAL GLASS SM (MISCELLANEOUS) IMPLANT
DERMABOND ADVANCED (GAUZE/BANDAGES/DRESSINGS) ×1
DERMABOND ADVANCED .7 DNX12 (GAUZE/BANDAGES/DRESSINGS) ×1 IMPLANT
DRAPE PED LAPAROTOMY (DRAPES) ×2 IMPLANT
DRAPE UTILITY XL STRL (DRAPES) ×2 IMPLANT
ELECT COATED BLADE 2.86 ST (ELECTRODE) ×2 IMPLANT
ELECT REM PT RETURN 9FT ADLT (ELECTROSURGICAL) ×2
ELECTRODE REM PT RTRN 9FT ADLT (ELECTROSURGICAL) ×1 IMPLANT
GAUZE SPONGE 4X4 12PLY STRL LF (GAUZE/BANDAGES/DRESSINGS) IMPLANT
GLOVE BIOGEL PI IND STRL 7.0 (GLOVE) ×1 IMPLANT
GLOVE BIOGEL PI INDICATOR 7.0 (GLOVE) ×1
GLOVE ECLIPSE 7.0 STRL STRAW (GLOVE) ×2 IMPLANT
GLOVE SURG SS PI 7.5 STRL IVOR (GLOVE) ×4 IMPLANT
GOWN PREVENTION PLUS XLARGE (GOWN DISPOSABLE) ×2 IMPLANT
GOWN PREVENTION PLUS XXLARGE (GOWN DISPOSABLE) ×2 IMPLANT
NEEDLE HYPO 22GX1.5 SAFETY (NEEDLE) ×2 IMPLANT
NS IRRIG 1000ML POUR BTL (IV SOLUTION) ×2 IMPLANT
PACK BASIN DAY SURGERY FS (CUSTOM PROCEDURE TRAY) ×2 IMPLANT
PENCIL BUTTON HOLSTER BLD 10FT (ELECTRODE) ×2 IMPLANT
SLEEVE SCD COMPRESS KNEE MED (MISCELLANEOUS) ×2 IMPLANT
SPONGE LAP 4X18 X RAY DECT (DISPOSABLE) ×2 IMPLANT
STRIP CLOSURE SKIN 1/2X4 (GAUZE/BANDAGES/DRESSINGS) IMPLANT
SUT MNCRL AB 4-0 PS2 18 (SUTURE) ×2 IMPLANT
SUT VICRYL 3-0 CR8 SH (SUTURE) ×2 IMPLANT
SYR BULB IRRIGATION 50ML (SYRINGE) ×2 IMPLANT
SYR CONTROL 10ML LL (SYRINGE) ×2 IMPLANT
TOWEL OR 17X24 6PK STRL BLUE (TOWEL DISPOSABLE) ×2 IMPLANT
TOWEL OR NON WOVEN STRL DISP B (DISPOSABLE) ×2 IMPLANT

## 2012-11-24 NOTE — Anesthesia Procedure Notes (Signed)
Procedure Name: LMA Insertion Date/Time: 11/24/2012 7:51 AM Performed by: Zenia Resides D Pre-anesthesia Checklist: Patient identified, Emergency Drugs available, Suction available, Patient being monitored and Timeout performed Patient Re-evaluated:Patient Re-evaluated prior to inductionOxygen Delivery Method: Circle System Utilized Preoxygenation: Pre-oxygenation with 100% oxygen Intubation Type: IV induction Ventilation: Mask ventilation without difficulty LMA: LMA inserted LMA Size: 4.0 Number of attempts: 1 Airway Equipment and Method: bite block Placement Confirmation: positive ETCO2 and breath sounds checked- equal and bilateral Tube secured with: Tape Dental Injury: Teeth and Oropharynx as per pre-operative assessment

## 2012-11-24 NOTE — Anesthesia Preprocedure Evaluation (Signed)
Anesthesia Evaluation  Patient identified by MRN, date of birth, ID band Patient awake    Reviewed: Allergy & Precautions, H&P , NPO status , Patient's Chart, lab work & pertinent test results  Airway Mallampati: II TM Distance: >3 FB Neck ROM: Full    Dental No notable dental hx. (+) Poor Dentition and Dental Advisory Given   Pulmonary neg pulmonary ROS,  breath sounds clear to auscultation  Pulmonary exam normal       Cardiovascular hypertension, On Medications Rhythm:Regular Rate:Normal     Neuro/Psych  Headaches, negative psych ROS   GI/Hepatic negative GI ROS, Neg liver ROS,   Endo/Other  negative endocrine ROS  Renal/GU negative Renal ROS  negative genitourinary   Musculoskeletal   Abdominal   Peds  Hematology negative hematology ROS (+)   Anesthesia Other Findings   Reproductive/Obstetrics negative OB ROS                           Anesthesia Physical Anesthesia Plan  ASA: II  Anesthesia Plan: General   Post-op Pain Management:    Induction: Intravenous  Airway Management Planned: LMA  Additional Equipment:   Intra-op Plan:   Post-operative Plan: Extubation in OR  Informed Consent: I have reviewed the patients History and Physical, chart, labs and discussed the procedure including the risks, benefits and alternatives for the proposed anesthesia with the patient or authorized representative who has indicated his/her understanding and acceptance.   Dental advisory given  Plan Discussed with: CRNA  Anesthesia Plan Comments:         Anesthesia Quick Evaluation

## 2012-11-24 NOTE — H&P (View-Only) (Signed)
Subjective:     Patient ID: Yvonne [name], female   DOB: 1946-03-27, 66 y.o.   MRN: [id number]  HPI This patient follows up status post right modified radical mastectomy for a T2, N1, M0 right breast cancer. She has been doing well and is currently on hormonal therapy. She only had one round of her chemotherapy because this was not tolerated well. She still has some slight upper arm swelling on the right. She does not wear any compression hose on this side because of the cost. She says that she is feeling well and denies any systemic symptoms. She says that she is doing her self breast exam and has not noticed any changes. She is requesting that her port be removed  Review of Systems     Objective:   Physical Exam No acute distress and nontoxic-appearing Her right mastectomy incision has some nodularity on the medial aspect of the incision but there is no suspicious masses or sign of recurrence. There is no suspicious lymphadenopathy. Her incision is well-healed. I do not appreciate any significant lymphedema on exam today. Her left breast is normal exam. Her lumpectomy incision is well healed and there is no sign of any recurrent masses in this area.    Assessment:     History of right breast cancer-doing well I think it is reasonable to remove her Mediport however she will be high risk given her nodal disease and her inability to tolerate chemotherapy. She is on hormonal treatment which would certainly help and I agree with this. We will go ahead and set her up for Mediport removal after her followup mammogram next week. If this is normal without any evidence of suspicious lesions, then we will go ahead and set her up for port removal.    Plan:     Continue monthly self breast exams and I'll see her back in about 6 months for repeat exam. She will get her mammogram next week and if this is normal we will go ahead and plan for port removal

## 2012-11-24 NOTE — Brief Op Note (Addendum)
11/24/2012  8:31 AM  PATIENT:  Yvonne [name]  66 y.o. female  PRE-OPERATIVE DIAGNOSIS:  mediport/br cancer  POST-OPERATIVE DIAGNOSIS:  mediport/br cancer  PROCEDURE:  Procedure(s) (LRB) with comments: REMOVAL PORT-A-CATH (Left)  SURGEON:  Surgeon(s) and Role:    * [name], DO - Primary  PHYSICIAN ASSISTANT:   ASSISTANTS: none   ANESTHESIA:   general  EBL:  Total I/O In: 800 [I.V.:800] Out: -   BLOOD ADMINISTERED:none  DRAINS: none   LOCAL MEDICATIONS USED:  MARCAINE    and LIDOCAINE   SPECIMEN:  Source of Specimen:  mediport  DISPOSITION OF SPECIMEN:  N/A  COUNTS:  YES  TOURNIQUET:  * No tourniquets in log *  DICTATION: .Other Dictation: Dictation Number [id number]  PLAN OF CARE: Discharge to home after PACU  PATIENT DISPOSITION:  PACU - hemodynamically stable.   Delay start of Pharmacological VTE agent (>24hrs) due to surgical blood loss or risk of bleeding: no  After induction of anesthesia and prior to port removal.  I performed a bilateral breast exam again and no suspicious masses or lymphadenopathy or sign of recurrence was identified.

## 2012-11-24 NOTE — Anesthesia Postprocedure Evaluation (Signed)
  Anesthesia Post-op Note  Patient: Yvonne [name]  Procedure(s) Performed: Procedure(s) (LRB) with comments: REMOVAL PORT-A-CATH (Left)  Patient Location: PACU  Anesthesia Type:General  Level of Consciousness: awake, alert  and oriented  Airway and Oxygen Therapy: Patient Spontanous Breathing  Post-op Pain: none  Post-op Assessment: Post-op Vital signs reviewed, Patient's Cardiovascular Status Stable, Respiratory Function Stable, Patent Airway and No signs of Nausea or vomiting  Post-op Vital Signs: Reviewed and stable  Complications: No apparent anesthesia complications

## 2012-11-24 NOTE — Interval H&P Note (Signed)
History and Physical Interval Note:  11/24/2012 7:33 AM  Yvonne [name]  has presented today for surgery, with the diagnosis of mediport/br cancer  The various methods of treatment have been discussed with the patient and family. After consideration of risks, benefits and other options for treatment, the patient has consented to  Procedure(s) (LRB) with comments: REMOVAL PORT-A-CATH (N/A) as a surgical intervention .  The patient's history has been reviewed, patient examined, no change in status, stable for surgery.  I have reviewed the patient's chart and labs.  Questions were answered to the patient's satisfaction.  I have seen and evaluated the patient.  Site marked.  Risks of the procedure again discussed and she desires to proceed with left port removal.  She understands that she is higher risk due to nodal disease and inability to tolerate chemo.  I have recently seen and examined her breasts and no evidence of recurrence.  Risks of infection, bleeding, pain, scarring, and need for port replacement.  She desires to proceed with removal.   [name] DAVID

## 2012-11-24 NOTE — Transfer of Care (Signed)
Immediate Anesthesia Transfer of Care Note  Patient: Yvonne [name]  Procedure(s) Performed: Procedure(s) (LRB) with comments: REMOVAL PORT-A-CATH (Left)  Patient Location: PACU  Anesthesia Type:General  Level of Consciousness: awake, alert  and oriented  Airway & Oxygen Therapy: Patient Spontanous Breathing and Patient connected to face mask oxygen  Post-op Assessment: Report given to PACU RN and Post -op Vital signs reviewed and stable  Post vital signs: Reviewed and stable  Complications: No apparent anesthesia complications

## 2012-11-25 ENCOUNTER — Encounter (HOSPITAL_BASED_OUTPATIENT_CLINIC_OR_DEPARTMENT_OTHER): Payer: Self-pay | Admitting: General Surgery

## 2012-11-25 ENCOUNTER — Telehealth (INDEPENDENT_AMBULATORY_CARE_PROVIDER_SITE_OTHER): Payer: Self-pay

## 2012-11-25 NOTE — Telephone Encounter (Signed)
I called and left the pt a message to call me.  I want to check on her s/p porta cath removal.  She does not need an appointment for follow up unless she has problems.

## 2012-11-25 NOTE — Op Note (Signed)
[name], [name] NO.:  [id number]  MEDICAL RECORD NO.:  [id number]  LOCATION:                                 FACILITY:  PHYSICIAN:  [name], MD       DATE OF BIRTH:  [birth year]  DATE OF PROCEDURE:  11/24/2012 DATE OF DISCHARGE:                              OPERATIVE REPORT   PROCEDURE:  Removal of left subclavian MediPort.  PREOPERATIVE DIAGNOSIS:  History of right breast cancer.  POSTOPERATIVE DIAGNOSIS:  History of right breast cancer.  SURGEON:  [name], MD  ASSISTANT:  None.  ANESTHESIA:  General LMA anesthesia with 30 mL of 1% lidocaine with epinephrine and 0.25% Marcaine in 50:50 mixture.  FLUIDS:  800 mL crystalloid.  ESTIMATED BLOOD LOSS:  Minimal.  DRAINS:  None.  SPECIMENS:  MediPort removed but not sent to Pathology.  COMPLICATIONS:  None apparent.  FINDINGS:  Removal of 8-French power port capsule removed, no apparent complications.  INDICATION:  [name] is a 66 year old female with history of right breast cancer with status post modified radical mastectomy with some nodal disease.  She did not tolerate chemotherapy, currently on hormonal treatment.  She is referred back to me by her oncologist for port removal.  She did not tolerate the chemotherapy.  OPERATIVE DETAILS:  [name] was seen and evaluated in preoperative area and risks and benefits of procedure were again discussed in lay terms.  Informed consent was obtained.  I again discussed with her the high risk nature of her tumor and the possibility for need of port replacement in the case of recurrent and she expressed understanding.  Surgical site was marked and she was taken to the operating room, placed on the table in a supine position, her left arm at her side.  General LMA anesthesia obtained and her left chest and neck were prepped and draped in standard surgical fashion.  Procedure time-out was performed with all operative team members  to confirm proper patient, procedure, and her prior incision was opened up and dissection carried down to the subcutaneous tissue using Bovie electrocautery.  The catheter tubing was identified and removed easily from the subclavian vein.  Catheter tubing was intact.  Pressure was held on the clavicle while I used the tubing as a handle and elevated the port and dissected down onto the port reservoir.  The Prolene sutures were located at the 10 o'clock and 2 o'clock position and these were cut and the port was easily removed and sutures were removed.  The port was not sent to Pathology.  The capsule was then removed with Bovie electrocautery and the wound was injected with total of 30 mL of 1% lidocaine with epinephrine and 0.25% Marcaine in a 50:50 mixture.  The wound was inspected for hemostasis, which was noted to be adequate and irrigated with sterile saline solution.  After the wound was noted to be hemostatic, I approximated the subcutaneous tissue and dermis using interrupted 3-0 Vicryl sutures and subcuticular suture with 4-0 Monocryl was placed to approximate skin edges.  Skin was washed and dried and Dermabond was applied.  All sponge, needle, and instrument counts were correct at end of the case.  The patient tolerated the procedure well without apparent complication.          ______________________________ [name], MD     BL/MEDQ  D:  11/24/2012  T:  11/24/2012  Job:  [id number]

## 2013-02-09 ENCOUNTER — Encounter (HOSPITAL_COMMUNITY): Payer: Self-pay | Admitting: Internal Medicine

## 2013-02-09 ENCOUNTER — Inpatient Hospital Stay (HOSPITAL_COMMUNITY)
Admission: EM | Admit: 2013-02-09 | Discharge: 2013-02-11 | DRG: 392 | Disposition: A | Discharge status: Home / Self Care | Payer: Medicare Other | Attending: Internal Medicine | Admitting: Internal Medicine

## 2013-02-09 DIAGNOSIS — E7800 Pure hypercholesterolemia, unspecified: Secondary | ICD-10-CM | POA: Diagnosis present

## 2013-02-09 DIAGNOSIS — N179 Acute kidney failure, unspecified: Secondary | ICD-10-CM

## 2013-02-09 DIAGNOSIS — IMO0002 Reserved for concepts with insufficient information to code with codable children: Secondary | ICD-10-CM

## 2013-02-09 DIAGNOSIS — M8580 Other specified disorders of bone density and structure, unspecified site: Secondary | ICD-10-CM

## 2013-02-09 DIAGNOSIS — A088 Other specified intestinal infections: Principal | ICD-10-CM | POA: Diagnosis present

## 2013-02-09 DIAGNOSIS — D72829 Elevated white blood cell count, unspecified: Secondary | ICD-10-CM

## 2013-02-09 DIAGNOSIS — E119 Type 2 diabetes mellitus without complications: Secondary | ICD-10-CM | POA: Diagnosis present

## 2013-02-09 DIAGNOSIS — R7401 Elevation of levels of liver transaminase levels: Secondary | ICD-10-CM | POA: Diagnosis present

## 2013-02-09 DIAGNOSIS — R1115 Cyclical vomiting syndrome unrelated to migraine: Secondary | ICD-10-CM | POA: Diagnosis present

## 2013-02-09 DIAGNOSIS — C50919 Malignant neoplasm of unspecified site of unspecified female breast: Secondary | ICD-10-CM | POA: Diagnosis present

## 2013-02-09 DIAGNOSIS — M129 Arthropathy, unspecified: Secondary | ICD-10-CM | POA: Diagnosis present

## 2013-02-09 DIAGNOSIS — E860 Dehydration: Secondary | ICD-10-CM

## 2013-02-09 DIAGNOSIS — I10 Essential (primary) hypertension: Secondary | ICD-10-CM | POA: Diagnosis present

## 2013-02-09 DIAGNOSIS — E876 Hypokalemia: Secondary | ICD-10-CM

## 2013-02-09 DIAGNOSIS — E1129 Type 2 diabetes mellitus with other diabetic kidney complication: Secondary | ICD-10-CM | POA: Diagnosis present

## 2013-02-09 DIAGNOSIS — K5289 Other specified noninfective gastroenteritis and colitis: Secondary | ICD-10-CM

## 2013-02-09 DIAGNOSIS — Z853 Personal history of malignant neoplasm of breast: Secondary | ICD-10-CM

## 2013-02-09 DIAGNOSIS — A059 Bacterial foodborne intoxication, unspecified: Secondary | ICD-10-CM | POA: Diagnosis present

## 2013-02-09 DIAGNOSIS — M1990 Unspecified osteoarthritis, unspecified site: Secondary | ICD-10-CM

## 2013-02-09 DIAGNOSIS — E1169 Type 2 diabetes mellitus with other specified complication: Secondary | ICD-10-CM | POA: Diagnosis present

## 2013-02-09 DIAGNOSIS — Z9049 Acquired absence of other specified parts of digestive tract: Secondary | ICD-10-CM

## 2013-02-09 DIAGNOSIS — R7402 Elevation of levels of lactic acid dehydrogenase (LDH): Secondary | ICD-10-CM | POA: Diagnosis present

## 2013-02-09 DIAGNOSIS — C801 Malignant (primary) neoplasm, unspecified: Secondary | ICD-10-CM

## 2013-02-09 DIAGNOSIS — Z809 Family history of malignant neoplasm, unspecified: Secondary | ICD-10-CM

## 2013-02-09 DIAGNOSIS — Z87891 Personal history of nicotine dependence: Secondary | ICD-10-CM

## 2013-02-09 DIAGNOSIS — R809 Proteinuria, unspecified: Secondary | ICD-10-CM | POA: Diagnosis present

## 2013-02-09 DIAGNOSIS — K529 Noninfective gastroenteritis and colitis, unspecified: Secondary | ICD-10-CM | POA: Diagnosis present

## 2013-02-09 DIAGNOSIS — E785 Hyperlipidemia, unspecified: Secondary | ICD-10-CM | POA: Diagnosis present

## 2013-02-09 DIAGNOSIS — C50419 Malignant neoplasm of upper-outer quadrant of unspecified female breast: Secondary | ICD-10-CM

## 2013-02-09 HISTORY — DX: Acute kidney failure, unspecified: N17.9

## 2013-02-09 LAB — CBC WITH DIFFERENTIAL/PLATELET
Basophils Absolute: 0 10*3/uL (ref 0.0–0.1)
Basophils Relative: 0 % (ref 0–1)
HCT: 38.7 % (ref 36.0–46.0)
MCHC: 33.9 g/dL (ref 30.0–36.0)
Monocytes Absolute: 0.4 10*3/uL (ref 0.1–1.0)
Neutro Abs: 10.3 10*3/uL — ABNORMAL HIGH (ref 1.7–7.7)
Neutrophils Relative %: 85 % — ABNORMAL HIGH (ref 43–77)
RDW: 14.1 % (ref 11.5–15.5)

## 2013-02-09 LAB — COMPREHENSIVE METABOLIC PANEL
AST: 60 U/L — ABNORMAL HIGH (ref 0–37)
Albumin: 3.9 g/dL (ref 3.5–5.2)
Chloride: 99 mEq/L (ref 96–112)
Creatinine, Ser: 0.92 mg/dL (ref 0.50–1.10)
Total Bilirubin: 0.7 mg/dL (ref 0.3–1.2)

## 2013-02-09 LAB — BASIC METABOLIC PANEL
CO2: 30 mEq/L (ref 19–32)
Chloride: 101 mEq/L (ref 96–112)
Glucose, Bld: 118 mg/dL — ABNORMAL HIGH (ref 70–99)
Potassium: 3.1 mEq/L — ABNORMAL LOW (ref 3.5–5.1)
Sodium: 141 mEq/L (ref 135–145)

## 2013-02-09 MED ORDER — ONDANSETRON HCL 4 MG/2ML IJ SOLN
4.0000 mg | Freq: Once | INTRAMUSCULAR | Status: AC
  Administered 2013-02-09: 4 mg via INTRAVENOUS
  Filled 2013-02-09: qty 2

## 2013-02-09 MED ORDER — ACETAMINOPHEN 325 MG PO TABS: 650.0000 mg | ORAL_TABLET | Freq: Four times a day (QID) | ORAL | Status: DC | PRN

## 2013-02-09 MED ORDER — SODIUM CHLORIDE 0.9 % IV BOLUS (SEPSIS)
500.0000 mL | Freq: Once | INTRAVENOUS | Status: AC
  Administered 2013-02-09: 500 mL via INTRAVENOUS

## 2013-02-09 MED ORDER — POTASSIUM CHLORIDE 10 MEQ/100ML IV SOLN
10.0000 meq | Freq: Once | INTRAVENOUS | Status: AC
  Administered 2013-02-09: 10 meq via INTRAVENOUS
  Filled 2013-02-09: qty 100

## 2013-02-09 MED ORDER — FLUTICASONE PROPIONATE 50 MCG/ACT NA SUSP
1.0000 | Freq: Every day | NASAL | Status: DC
  Administered 2013-02-10 – 2013-02-11 (×2): 1 via NASAL
  Filled 2013-02-09 (×2): qty 16

## 2013-02-09 MED ORDER — LORATADINE 10 MG PO TABS
10.0000 mg | ORAL_TABLET | Freq: Every day | ORAL | Status: DC
  Administered 2013-02-10 – 2013-02-11 (×2): 10 mg via ORAL
  Filled 2013-02-09 (×3): qty 1

## 2013-02-09 MED ORDER — POTASSIUM CHLORIDE IN NACL 20-0.9 MEQ/L-% IV SOLN
INTRAVENOUS | Status: DC
  Administered 2013-02-09: 16:00:00 via INTRAVENOUS
  Filled 2013-02-09 (×5): qty 1000

## 2013-02-09 MED ORDER — PROMETHAZINE HCL 25 MG/ML IJ SOLN
25.0000 mg | INTRAMUSCULAR | Status: AC
  Administered 2013-02-09: 25 mg via INTRAVENOUS
  Filled 2013-02-09: qty 1

## 2013-02-09 MED ORDER — ONDANSETRON HCL 4 MG/2ML IJ SOLN
4.0000 mg | Freq: Four times a day (QID) | INTRAMUSCULAR | Status: DC | PRN
  Administered 2013-02-09 – 2013-02-10 (×2): 4 mg via INTRAVENOUS
  Filled 2013-02-09 (×2): qty 2

## 2013-02-09 MED ORDER — POTASSIUM CHLORIDE 10 MEQ/100ML IV SOLN
10.0000 meq | INTRAVENOUS | Status: AC
  Administered 2013-02-09 (×3): 10 meq via INTRAVENOUS
  Filled 2013-02-09: qty 100
  Filled 2013-02-09: qty 200

## 2013-02-09 MED ORDER — ACETAMINOPHEN 650 MG RE SUPP: 650.0000 mg | Freq: Four times a day (QID) | RECTAL | Status: DC | PRN

## 2013-02-09 MED ORDER — ASPIRIN 81 MG PO CHEW
81.0000 mg | CHEWABLE_TABLET | Freq: Every day | ORAL | Status: DC
  Administered 2013-02-10 – 2013-02-11 (×2): 81 mg via ORAL
  Filled 2013-02-09 (×3): qty 1

## 2013-02-09 MED ORDER — ASPIRIN 81 MG PO TABS: 81.0000 mg | ORAL_TABLET | Freq: Every day | ORAL | Status: DC

## 2013-02-09 MED ORDER — ONDANSETRON HCL 4 MG PO TABS: 4.0000 mg | ORAL_TABLET | Freq: Four times a day (QID) | ORAL | Status: DC | PRN

## 2013-02-09 MED ORDER — ATORVASTATIN CALCIUM 40 MG PO TABS
40.0000 mg | ORAL_TABLET | Freq: Every day | ORAL | Status: DC
  Administered 2013-02-10: 40 mg via ORAL
  Filled 2013-02-09 (×3): qty 1

## 2013-02-09 MED ORDER — POTASSIUM CHLORIDE CRYS ER 20 MEQ PO TBCR
40.0000 meq | EXTENDED_RELEASE_TABLET | Freq: Once | ORAL | Status: AC
  Administered 2013-02-09: 40 meq via ORAL
  Filled 2013-02-09: qty 2

## 2013-02-09 MED ORDER — POTASSIUM CHLORIDE CRYS ER 20 MEQ PO TBCR
20.0000 meq | EXTENDED_RELEASE_TABLET | Freq: Every day | ORAL | Status: DC
  Administered 2013-02-10 – 2013-02-11 (×2): 20 meq via ORAL
  Filled 2013-02-09 (×2): qty 1

## 2013-02-09 MED ORDER — ALBUTEROL SULFATE (5 MG/ML) 0.5% IN NEBU
2.5000 mg | INHALATION_SOLUTION | RESPIRATORY_TRACT | Status: DC | PRN
Start: 2013-02-09 — End: 2013-02-11

## 2013-02-09 MED ORDER — ANASTROZOLE 1 MG PO TABS
1.0000 mg | ORAL_TABLET | Freq: Every day | ORAL | Status: DC
  Administered 2013-02-10 – 2013-02-11 (×2): 1 mg via ORAL
  Filled 2013-02-09 (×4): qty 1

## 2013-02-09 MED ORDER — ENOXAPARIN SODIUM 40 MG/0.4ML ~~LOC~~ SOLN
40.0000 mg | SUBCUTANEOUS | Status: DC
  Administered 2013-02-09 – 2013-02-10 (×2): 40 mg via SUBCUTANEOUS
  Filled 2013-02-09 (×3): qty 0.4

## 2013-02-09 MED ORDER — FLUTICASONE FUROATE 27.5 MCG/SPRAY NA SUSP: 2.0000 | Freq: Every day | NASAL | Status: DC

## 2013-02-09 MED ORDER — SODIUM CHLORIDE 0.9 % IV BOLUS (SEPSIS)
1000.0000 mL | Freq: Once | INTRAVENOUS | Status: AC
Start: 2013-02-09 — End: 2013-02-09
  Administered 2013-02-09: 1000 mL via INTRAVENOUS

## 2013-02-09 NOTE — ED Provider Notes (Signed)
Shelda Jakes, MD  Medical screening examination/treatment/procedure(s) were conducted as a shared visit with non-physician practitioner(s) and myself.  I personally evaluated the patient during the encounter   Patient with acute onset of vomiting and diarrhea last night around the 8 PM. Patient has vomited multiple times during the night approximately 10 times and had 3 episodes of urinary loose bowel movements consistent with diarrhea. No blood in either one. Patient's abdomen is soft nontender suspect this is a viral gastroenteritis patient however though was still feeling nausea with vomiting despite Zofran and Phenergan patient's electrolytes potassium was a little below sided patient would probably benefit from further IV hydration and anti-medics in the hospital at this point in time. She started been down here 5 and half hours. Will make arrangements for admission.  Shelda Jakes, MD 02/09/13 [phone]

## 2013-02-09 NOTE — ED Notes (Signed)
PT. REPORTS PERSISTENT VOMITTING / DIARRHEA ONSET LAST NIGHT , DENIES FEVER OR CHILLS, NO ABDOMINAL PAIN .

## 2013-02-09 NOTE — ED Notes (Signed)
Called floor to give report. RN will call back 

## 2013-02-09 NOTE — ED Notes (Signed)
Pt presents with N/V/D that began last night.  Pt states that he has vomited more than 10 times and has had 3 episodes of diarrhea.  Pt states that she has not had anything to eat since 1530 yesterday.

## 2013-02-09 NOTE — ED Notes (Signed)
Pt had mastectomy of R breast in May.

## 2013-02-09 NOTE — H&P (Signed)
[facility] History and Physical  Yvonne [name]:[id number] DOB: Jan 14, 1946 DOA: 02/09/2013  Referring physician: Magnus Sinning, ED PA-C PCP: Dorrene German, MD  OP Specialists:  Oncology: Dr. Darnelle Catalan  Chief Complaint: intractable nausea, vomiting and some diarrhea.  HPI: Yvonne [name] is a 67 y.o. female with history of HTN, HL, breast cancer status post right mastectomy-on hormonal therapy,presents to the ED on 02/09/13 with complaints of nausea, vomiting and diarrhea. 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 She ate some chicken wings from hospital cafeteria at a colleagues baby shower at approximately 4 PM on 02/08/13. She felt fine until approximately 10 PM on 2/19. Since then she has had innumerable episodes of nausea and vomiting. The contents have been bilious in nature without coffee-ground or blood. She has not been able to keep any oral intake down. After multiple episodes of vomiting, she complains of some soreness in the lower abdomen. She has had about 3 episodes of watery green diarrhea without blood. She denies fever or chills. Denies any other colleagues with similar symptoms. No recent use of antibiotics. In the ED, potassium 2.9. Despite antiemetics and IV fluids, she continued to vomit in the ED. Hospitalist services thereby requested to admit for further evaluation and management.   Review of Systems: all systems reviewed and apart from history of presenting illness, are negative.  Past Medical History  Diagnosis Date  . Elevated cholesterol     takes Crestor daily  . Hypertension     takes Maxzide daily   . Phlebitis 60yrs ago    hx of-  . Cancer 03/25/12    right breast  . Seasonal allergies     takes Zyrtec daily and Nasal Spray prn  . Headache   . Arthritis   . Chronic back pain     arthritis  . Bruises easily   . Hemorrhoids   . Breast cancer 04/2012  . History of chemotherapy     x 1, pt unable to tolerate, on PO chemotherpay    Past Surgical History  Procedure Laterality Date  . Appendectomy  78yrs ago  . Tubal ligation  58yrs ago  . Colonoscopy    . Portacath placement  04/26/2012    Procedure: INSERTION PORT-A-CATH;  Surgeon: [name], DO;  Location: [facility] OR;  Service: General;  Laterality: Left;  Started at 1746.  . Breast surgery  04/27/12    Right mod mastectomy, ER/PR +, HER2 -  . Breast surgery  04/27/12    Left Breast NL lumpectomy-no malignancy  . Port-a-cath removal  11/24/2012    Procedure: REMOVAL PORT-A-CATH;  Surgeon: [name], DO;  Location:  SUR[facility]ERY CENTER;  Service: General;  Laterality: Left;   Social History:  reports that she has quit smoking. She has never used smokeless tobacco. She reports that she does not drink alcohol or use illicit drugs. Married. Independent of activities of daily living.  Allergies  Allergen Reactions  . Penicillins Anaphylaxis    Family History  Problem Relation Age of Onset  . Cancer Mother     ovarian cancer  . Cancer Sister 37    colon cancer  . Cancer Brother 42    colon cancer  . Cancer Brother     prostate cancer, deceased  . Anesthesia problems Neg Hx   . Hypotension Neg Hx   . Malignant hyperthermia Neg Hx   . Pseudochol deficiency Neg Hx   . Leukemia Father   . Cancer Father  leukemia    Prior to Admission medications   Medication Sig Start Date End Date Taking? Authorizing Provider  anastrozole (ARIMIDEX) 1 MG tablet Take 1 mg by mouth daily.   Yes [name], MD  aspirin 81 MG tablet Take 81 mg by mouth daily.   Yes Historical Provider, MD  Calcium Carbonate-Vitamin D (CALCIUM + D PO) Take 1 tablet by mouth daily.   Yes Historical Provider, MD  celecoxib (CELEBREX) 200 MG capsule Take 200 mg by mouth daily.   Yes Historical Provider, MD  cetirizine (ZYRTEC) 10 MG chewable tablet Chew 10 mg by mouth daily.   Yes Historical Provider, MD  fluticasone (VERAMYST) 27.5 MCG/SPRAY nasal spray Place 2 sprays into the nose  daily.   Yes Historical Provider, MD  Multiple Vitamins-Minerals (CERTAVITE SENIOR/ANTIOXIDANT) TABS Take 1 tablet by mouth.   Yes Historical Provider, MD  potassium chloride SA (K-DUR,KLOR-CON) 20 MEQ tablet Take 20 mEq by mouth daily.   Yes Historical Provider, MD  rosuvastatin (CRESTOR) 20 MG tablet Take 20 mg by mouth daily.   Yes Historical Provider, MD  triamterene-hydrochlorothiazide (MAXZIDE-25) 37.5-25 MG per tablet Take 1 tablet by mouth daily.   Yes Historical Provider, MD   Physical Exam: Filed Vitals:   02/09/13 0919 02/09/13 1052 02/09/13 1100 02/09/13 1246  BP: 141/44 144/69 152/62 136/44  Pulse: 69 63 70 59  Temp: 98.9 F (37.2 C)   99.3 F (37.4 C)  TempSrc: Oral Oral  Oral  Resp: 16 16  16   SpO2: 94% 96% 95% 95%     General exam: Moderately built and nourished female patient, lying comfortably supine on the gurney in no obvious distress. Does not look septic or toxic.  Head, eyes and ENT: Nontraumatic and normocephalic. Pupils equally reacting to light and accommodation. Oral mucosa dry.  Neck: Supple. No JVD, carotid bruit or thyromegaly.  Lymphatics: No lymphadenopathy.  Respiratory system: Clear to auscultation. No increased work of breathing.  Cardiovascular system: S1 and S2 heard, RRR. No JVD, murmurs, gallops, clicks or pedal edema.  Gastrointestinal system: Abdomen is nondistended, soft and nontender. Normal bowel sounds heard. No organomegaly or masses appreciated.  Central nervous system: Alert and oriented. No focal neurological deficits.  Extremities: Symmetric 5 x 5 power. Peripheral pulses symmetrically felt.  Skin: No rashes or acute findings.  Musculoskeletal system: Negative exam.  Psychiatry: Pleasant and cooperative.   Labs on Admission:  Basic Metabolic Panel:  Recent Labs Lab 02/09/13 1000  NA 142  K 2.9*  CL 99  CO2 31  GLUCOSE 149*  BUN 22  CREATININE 0.92  CALCIUM 9.5   Liver Function Tests:  Recent Labs Lab  02/09/13 1000  AST 60*  ALT 51*  ALKPHOS 71  BILITOT 0.7  PROT 7.4  ALBUMIN 3.9    Recent Labs Lab 02/09/13 1000  LIPASE 32   No results found for this basename: AMMONIA,  in the last 168 hours CBC:  Recent Labs Lab 02/09/13 1000  WBC 12.1*  NEUTROABS 10.3*  HGB 13.1  HCT 38.7  MCV 86.2  PLT 217   Cardiac Enzymes: No results found for this basename: CKTOTAL, CKMB, CKMBINDEX, TROPONINI,  in the last 168 hours  BNP (last 3 results) No results found for this basename: PROBNP,  in the last 8760 hours CBG: No results found for this basename: GLUCAP,  in the last 168 hours  Radiological Exams on Admission: No results found.    Assessment/Plan Principal Problem:   Gastroenteritis, acute Active Problems:  Hyperlipidemia   Hypertension   Breast cancer   Dehydration   Hypokalemia   1. Intractable nausea, vomiting and some diarrhea: DD-acute viral gastroenteritis versus food poisoning. Admit to med surgical floor. Contact isolation. Symptomatic treatment- and advance diet as tolerated. 2. Hypokalemia: Secondary to nausea, vomiting, diarrhea and diuretics. Replete by mouth and IV and follow BMP closely. 3. Dehydration: IV fluids and start oral diet as tolerated. 4. Mild leukocytosis: Likely stress margination from problem #1. 5. Mild transaminitis: Unclear etiology. Probably related to problem #1. Follow CMP in a.m. 6. Hypertension: Controlled. Hold diuretics for now. Monitor. 7. Hyperlipidemia: Continue statins. 8. History of breast cancer: Continue Arimidex.     Code Status: Full  Family Communication: discussed with patient's spouse and 2 other family members at bedside. Disposition Plan: home when medically stable.  Time spent: 50 minutes  [facility] Pager [phone]  If 7PM-7AM, please contact night-coverage [email or web address] Password [email or web address] 02/09/2013, 1:00 PM

## 2013-02-09 NOTE — ED Provider Notes (Signed)
History     CSN: [id number]  Arrival date & time 02/09/13  0602   First MD Initiated Contact with Patient 02/09/13 [phone]      Chief Complaint  Patient presents with  . Emesis  . Diarrhea    (Consider location/radiation/quality/duration/timing/severity/associated sxs/prior treatment) HPI Comments: Patient presents today with a chief complaint of nausea, vomiting, and diarrhea.  Symptoms began last evening.  She has had several episodes of both vomiting and diarrhea.  She is unsure of the exact number of episodes.  No blood in her emesis or blood in her stool.  She denies fever.  She has mild epigastric discomfort with vomiting, but only when she vomits.  She has had a tubal ligation and Appendectomy.  No other abdominal surgeries.  No history of Gallstones or Pancreatitis.  She has not taken anything for her symptoms prior to arrival in the ED.  No known sick contacts.  Patient is a 67 y.o. female presenting with vomiting. The history is provided by the patient.  Emesis Quality:  Bilious material Progression:  Unchanged Associated symptoms: abdominal pain and diarrhea   Associated symptoms: no chills     Past Medical History  Diagnosis Date  . Elevated cholesterol     takes Crestor daily  . Hypertension     takes Maxzide daily   . Phlebitis 30yrs ago    hx of-  . Cancer 03/25/12    right breast  . Seasonal allergies     takes Zyrtec daily and Nasal Spray prn  . Headache   . Arthritis   . Chronic back pain     arthritis  . Bruises easily   . Hemorrhoids   . Breast cancer 04/2012  . History of chemotherapy     x 1, pt unable to tolerate, on PO chemotherpay    Past Surgical History  Procedure Laterality Date  . Appendectomy  76yrs ago  . Tubal ligation  33yrs ago  . Colonoscopy    . Portacath placement  04/26/2012    Procedure: INSERTION PORT-A-CATH;  Surgeon: [name], DO;  Location: [facility] OR;  Service: General;  Laterality: Left;  Started at 1746.  . Breast surgery   04/27/12    Right mod mastectomy, ER/PR +, HER2 -  . Breast surgery  04/27/12    Left Breast NL lumpectomy-no malignancy  . Port-a-cath removal  11/24/2012    Procedure: REMOVAL PORT-A-CATH;  Surgeon: [name], DO;  Location: [facility];  Service: General;  Laterality: Left;    Family History  Problem Relation Age of Onset  . Cancer Mother     ovarian cancer  . Cancer Sister 30    colon cancer  . Cancer Brother 63    colon cancer  . Cancer Brother     prostate cancer, deceased  . Anesthesia problems Neg Hx   . Hypotension Neg Hx   . Malignant hyperthermia Neg Hx   . Pseudochol deficiency Neg Hx   . Leukemia Father   . Cancer Father     leukemia    History  Substance Use Topics  . Smoking status: Former Games developer  . Smokeless tobacco: Never Used     Comment: quit 30+yrs ago  . Alcohol Use: No    OB History   Grav Para Term Preterm Abortions TAB SAB Ect Mult Living                  Review of Systems  Constitutional: Negative for fever and  chills.  Gastrointestinal: Positive for nausea, vomiting, abdominal pain and diarrhea. Negative for constipation, blood in stool and abdominal distention.       Abdominal pain with vomiting  Genitourinary: Negative for dysuria, urgency, frequency, hematuria and pelvic pain.  All other systems reviewed and are negative.    Allergies  Penicillins  Home Medications   Current Outpatient Rx  Name  Route  Sig  Dispense  Refill  . anastrozole (ARIMIDEX) 1 MG tablet   Oral   Take 1 mg by mouth daily.         Marland Kitchen aspirin 81 MG tablet   Oral   Take 81 mg by mouth daily.         . Calcium Carbonate-Vitamin D (CALCIUM + D PO)   Oral   Take 1 tablet by mouth daily.         . celecoxib (CELEBREX) 200 MG capsule   Oral   Take 200 mg by mouth daily.         . cetirizine (ZYRTEC) 10 MG chewable tablet   Oral   Chew 10 mg by mouth daily.         . fluticasone (VERAMYST) 27.5 MCG/SPRAY nasal spray   Nasal    Place 2 sprays into the nose daily.         . Multiple Vitamins-Minerals (CERTAVITE SENIOR/ANTIOXIDANT) TABS   Oral   Take 1 tablet by mouth.         . potassium chloride SA (K-DUR,KLOR-CON) 20 MEQ tablet   Oral   Take 20 mEq by mouth daily.         . rosuvastatin (CRESTOR) 20 MG tablet   Oral   Take 20 mg by mouth daily.         Marland Kitchen triamterene-hydrochlorothiazide (MAXZIDE-25) 37.5-25 MG per tablet   Oral   Take 1 tablet by mouth daily.           BP 176/52  Pulse 67  Temp(Src) 98.8 F (37.1 C) (Oral)  Resp 20  SpO2 98%  Physical Exam  Nursing note and vitals reviewed. Constitutional: She appears well-developed and well-nourished. No distress.  HENT:  Head: Normocephalic and atraumatic.  Mouth/Throat: Oropharynx is clear and moist.  Neck: Normal range of motion. Neck supple.  Cardiovascular: Normal rate, regular rhythm and normal heart sounds.   Pulmonary/Chest: Effort normal and breath sounds normal. No respiratory distress. She has no wheezes. She has no rales.  Abdominal: Soft. Bowel sounds are normal. She exhibits no distension and no mass. There is no tenderness. There is no rebound and no guarding.  Musculoskeletal: Normal range of motion.  Neurological: She is alert.  Skin: Skin is warm and dry. She is not diaphoretic.  Psychiatric: She has a normal mood and affect.    ED Course  Procedures (including critical care time)  Labs Reviewed - No data to display No results found.   No diagnosis found. Patient reports that her nausea has improved after Zofran.  Will fluid challenge and reassess.    Patient continues to have vomiting after given dose of Zofran.    Patient continues to vomit after dose of Zofran.  Will given another dose of Zofran and reassess.    Patient continues to have vomiting after second dose of Zofran.  Will order Phenergan and reassess.  Patient continues to have vomiting after two dose of Zofran and Phenergan.  Will admit  the patient.  Discussed with [facility] who has agreed to admit.  He recommends ordering three runs of IV Potassium for Hypokalemia.  MDM  Patient presenting with nausea, vomiting, and diarrhea.  Patient afebrile.  No abdominal pain on exam.  Symptoms consistent with viral Gastroenteritis.  Patient hypokalemic with a Potassium of 2.9.  Patient given IV Potassium.  Patient given two doses of 4 mg IV Zofran and one dose of 25 mg Phenergan and continued to have persistent vomiting.  Therefore, patient admitted to [facility] for further management of symptoms.        Pascal Lux Otis, PA-C 02/09/13 1851

## 2013-02-10 ENCOUNTER — Encounter (HOSPITAL_COMMUNITY): Payer: Self-pay | Admitting: *Deleted

## 2013-02-10 LAB — CBC
HCT: 35.5 % — ABNORMAL LOW (ref 36.0–46.0)
MCHC: 33.2 g/dL (ref 30.0–36.0)
Platelets: 174 10*3/uL (ref 150–400)
RDW: 14.5 % (ref 11.5–15.5)
WBC: 8.2 10*3/uL (ref 4.0–10.5)

## 2013-02-10 LAB — COMPREHENSIVE METABOLIC PANEL
ALT: 36 U/L — ABNORMAL HIGH (ref 0–35)
Alkaline Phosphatase: 58 U/L (ref 39–117)
BUN: 14 mg/dL (ref 6–23)
CO2: 30 mEq/L (ref 19–32)
Chloride: 104 mEq/L (ref 96–112)
GFR calc Af Amer: 74 mL/min — ABNORMAL LOW (ref >90)
GFR calc non Af Amer: 63 mL/min — ABNORMAL LOW (ref >90)
Glucose, Bld: 119 mg/dL — ABNORMAL HIGH (ref 70–99)
Potassium: 3.3 mEq/L — ABNORMAL LOW (ref 3.5–5.1)
Sodium: 143 mEq/L (ref 135–145)
Total Bilirubin: 0.7 mg/dL (ref 0.3–1.2)
Total Protein: 6.2 g/dL (ref 6.0–8.3)

## 2013-02-10 MED ORDER — ONDANSETRON HCL 4 MG PO TABS: 4.0000 mg | ORAL_TABLET | Freq: Four times a day (QID) | ORAL | Status: DC | PRN

## 2013-02-10 MED ORDER — POTASSIUM CHLORIDE CRYS ER 20 MEQ PO TBCR
40.0000 meq | EXTENDED_RELEASE_TABLET | Freq: Once | ORAL | Status: AC
  Administered 2013-02-10: 40 meq via ORAL
  Filled 2013-02-10: qty 2

## 2013-02-10 MED ORDER — UNABLE TO FIND: Status: DC

## 2013-02-10 MED ORDER — POTASSIUM CHLORIDE IN NACL 20-0.9 MEQ/L-% IV SOLN
INTRAVENOUS | Status: AC
  Filled 2013-02-10: qty 1000

## 2013-02-10 NOTE — Progress Notes (Signed)
[facility] PROGRESS NOTE  Yvonne [name] UVO:[id number] DOB: 1946-03-25 DOA: 02/09/2013 PCP: Dorrene German, MD  Assessment/Plan: Intractable nausea, vomiting and some diarrhea Likely due to acute viral gastroenteritis versus food poisoning. Improved with symptomatic treatment. Diet advanced to regular diet and currently tolerating it prior to discharge.   Hypokalemia Secondary to nausea, vomiting, diarrhea and diuretics. Replete by mouth and IV and follow BMP closely.  Dehydration Resolved with IV fluids and oral intake.   Mild leukocytosis Likely stress margination, resolved.  Mild transaminitis Unclear etiology. Probably related to nausea and vomiting, improved from yesterday.   Hypertension Controlled. Resume diuretics at discharge.   Hyperlipidemia Continue statins.  History of breast cancer Continue Arimidex.  Code Status: Full code Family Communication: No family at bedside. Disposition Plan: DC home today if patient continuing to tolerate oral intake.  Consultants:  None  Procedures:  As above.  Antibiotics:  None.  HPI/Subjective: Vomiting resolved. Nausea improved, now only intermittent. Diarrhea improved, stool are being more formed.  Objective: Filed Vitals:   02/10/13 0028 02/10/13 0244 02/10/13 0559 02/10/13 0939  BP: 142/57 115/47 138/63 123/51  Pulse: 79 58 56 69  Temp: 99.1 F (37.3 C)  99 F (37.2 C) 99.1 F (37.3 C)  TempSrc: Oral  Oral Oral  Resp:    20  Height:      Weight:   96.616 kg (213 lb)   SpO2: 93% 95% 94% 95%    Intake/Output Summary (Last 24 hours) at 02/10/13 1113 Last data filed at 02/10/13 0939  Gross per 24 hour  Intake    240 ml  Output      0 ml  Net    240 ml   Filed Weights   02/09/13 1428 02/10/13 0559  Weight: 94.484 kg (208 lb 4.8 oz) 96.616 kg (213 lb)    Exam: Physical Exam: General: Awake, Oriented, No acute distress. HEENT: EOMI. Neck: Supple CV: S1 and S2 Lungs: Clear to  ascultation bilaterally Abdomen: Soft, Nontender, Nondistended, +bowel sounds. Ext: Good pulses. Trace edema.  Data Reviewed: Basic Metabolic Panel:  Recent Labs Lab 02/09/13 1000 02/09/13 1756 02/10/13 0522  NA 142 141 143  K 2.9* 3.1* 3.3*  CL 99 101 104  CO2 31 30 30   GLUCOSE 149* 118* 119*  BUN 22 17 14   CREATININE 0.92 0.92 0.92  CALCIUM 9.5 9.0 9.1   Liver Function Tests:  Recent Labs Lab 02/09/13 1000 02/10/13 0522  AST 60* 39*  ALT 51* 36*  ALKPHOS 71 58  BILITOT 0.7 0.7  PROT 7.4 6.2  ALBUMIN 3.9 3.2*    Recent Labs Lab 02/09/13 1000  LIPASE 32   No results found for this basename: AMMONIA,  in the last 168 hours CBC:  Recent Labs Lab 02/09/13 1000 02/10/13 0522  WBC 12.1* 8.2  NEUTROABS 10.3*  --   HGB 13.1 11.8*  HCT 38.7 35.5*  MCV 86.2 87.9  PLT 217 174   Cardiac Enzymes: No results found for this basename: CKTOTAL, CKMB, CKMBINDEX, TROPONINI,  in the last 168 hours BNP (last 3 results) No results found for this basename: PROBNP,  in the last 8760 hours CBG: No results found for this basename: GLUCAP,  in the last 168 hours  No results found for this or any previous visit (from the past 240 hour(s)).   Studies: No results found.  Scheduled Meds: . anastrozole  1 mg Oral Daily  . aspirin  81 mg Oral Daily  . atorvastatin  40 mg  Oral q1800  . enoxaparin (LOVENOX) injection  40 mg Subcutaneous Q24H  . fluticasone  1 spray Each Nare Daily  . loratadine  10 mg Oral Daily  . potassium chloride SA  20 mEq Oral Daily   Continuous Infusions: . 0.9 % NaCl with KCl 20 mEq / L 125 mL/hr at 02/09/13 1832    Principal Problem:   Gastroenteritis, acute Active Problems:   Hyperlipidemia   Hypertension   Breast cancer   Dehydration   Hypokalemia   Leukocytosis   Transaminitis    Shubh Chiara A, MD  [facility] Pager [phone]. If 7PM-7AM, please contact night-coverage at [email or web address], password [facility] 02/10/2013, 11:13 AM   LOS: 1 day

## 2013-02-10 NOTE — Progress Notes (Signed)
Vomited X 2 , moderate amount. MD made aware.

## 2013-02-10 NOTE — Progress Notes (Signed)
MD paged via amion to update about patient.

## 2013-02-10 NOTE — Discharge Summary (Addendum)
Physician Discharge Summary  Yvonne [name]:[id number] DOB: Jul 12, 1946 DOA: 02/09/2013  PCP: Dorrene German, MD  Admit date: 02/09/2013 Discharge date: 02/11/2013  Time spent: 25 minutes  Recommendations for Outpatient Follow-up:  1. Followup with Dorrene German, MD (PCP) in 1 week.   Discharge Diagnoses:  Principal Problem:   Gastroenteritis, acute Active Problems:   Hyperlipidemia   Hypertension   Breast cancer   Dehydration   Hypokalemia   Leukocytosis   Transaminitis   Discharge Condition: Stable  Diet recommendation: Heart healthy diet.  Filed Weights   02/09/13 1428 02/10/13 0559  Weight: 94.484 kg (208 lb 4.8 oz) 96.616 kg (213 lb)    History of present illness:  On admission: "Yvonne [name] is a 67 y.o. female with history of HTN, HL, breast cancer status post right mastectomy-on hormonal therapy,presents to the ED on 02/09/13 with complaints of nausea, vomiting and diarrhea. 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 She ate some chicken wings from hospital cafeteria at a colleagues baby shower at approximately 4 PM on 02/08/13. She felt fine until approximately 10 PM on 2/19. Since then she has had innumerable episodes of nausea and vomiting."  Hospital Course:  Intractable nausea, vomiting and some diarrhea Likely due to acute viral gastroenteritis versus food poisoning. Improved with symptomatic treatment. Diet advanced to regular diet and currently tolerating it prior to discharge.   Hypokalemia Secondary to nausea, vomiting, diarrhea and diuretics. Replete by mouth and IV.  Dehydration Resolved with IV fluids and oral intake.   Mild leukocytosis Likely stress margination, resolved.  Mild transaminitis Unclear etiology. Probably related to nausea and vomiting, improved from yesterday.   Hypertension Controlled. Resume diuretics at discharge.   Hyperlipidemia Continue statins.  History of breast cancer Continue  Arimidex.  Consultants:  None  Procedures:  As above.  Antibiotics:  None.  Discharge Exam: Filed Vitals:   02/10/13 0939 02/10/13 1406 02/10/13 2149 02/11/13 0654  BP: 123/51 142/61 137/61 158/69  Pulse: 69 65 63 58  Temp: 99.1 F (37.3 C) 99.4 F (37.4 C) 99.7 F (37.6 C) 98.8 F (37.1 C)  TempSrc: Oral Oral Oral Oral  Resp: 20 18 18 18   Height:      Weight:      SpO2: 95% 96% 94% 95%   Discharge Instructions      Discharge Orders   Future Appointments Provider Department Dept Phone   02/20/2013 8:00 AM Mauri [facility] [phone]   02/27/2013 9:00 AM [name], MD [facility] [phone]   Future Orders Complete By Expires     Diet - low sodium heart healthy  As directed     Increase activity slowly  As directed         Medication List    TAKE these medications       anastrozole 1 MG tablet  Commonly known as:  ARIMIDEX  Take 1 mg by mouth daily.     aspirin 81 MG tablet  Take 81 mg by mouth daily.     CALCIUM + D PO  Take 1 tablet by mouth daily.     celecoxib 200 MG capsule  Commonly known as:  CELEBREX  Take 200 mg by mouth daily.     CERTAVITE SENIOR/ANTIOXIDANT Tabs  Take 1 tablet by mouth.     cetirizine 10 MG chewable tablet  Commonly known as:  ZYRTEC  Chew 10 mg by mouth daily.  fluticasone 27.5 MCG/SPRAY nasal spray  Commonly known as:  VERAMYST  Place 2 sprays into the nose daily.     ondansetron 4 MG tablet  Commonly known as:  ZOFRAN  Take 1 tablet (4 mg total) by mouth every 6 (six) hours as needed for nausea.     potassium chloride SA 20 MEQ tablet  Commonly known as:  K-DUR,KLOR-CON  Take 20 mEq by mouth daily.     rosuvastatin 20 MG tablet  Commonly known as:  CRESTOR  Take 20 mg by mouth daily.     triamterene-hydrochlorothiazide 37.5-25 MG per tablet  Commonly known as:  MAXZIDE-25  Take 1 tablet by mouth daily.     UNABLE  TO FIND  Please excuse Ms. Corella from any work obligations from 02/09/2013 till 02/12/2013. Patient can return to work on 02/13/2013 without any restrictions.       Follow-up Information   Follow up with AVBUERE,EDWIN A, MD. Schedule an appointment as soon as possible for a visit in 1 week.   Contact information:   [address] [place] [address] [phone]        The results of significant diagnostics from this hospitalization (including imaging, microbiology, ancillary and laboratory) are listed below for reference.    Significant Diagnostic Studies: No results found.  Microbiology: No results found for this or any previous visit (from the past 240 hour(s)).   Labs: Basic Metabolic Panel:  Recent Labs Lab 02/09/13 1000 02/09/13 1756 02/10/13 0522  NA 142 141 143  K 2.9* 3.1* 3.3*  CL 99 101 104  CO2 31 30 30   GLUCOSE 149* 118* 119*  BUN 22 17 14   CREATININE 0.92 0.92 0.92  CALCIUM 9.5 9.0 9.1   Liver Function Tests:  Recent Labs Lab 02/09/13 1000 02/10/13 0522  AST 60* 39*  ALT 51* 36*  ALKPHOS 71 58  BILITOT 0.7 0.7  PROT 7.4 6.2  ALBUMIN 3.9 3.2*    Recent Labs Lab 02/09/13 1000  LIPASE 32   No results found for this basename: AMMONIA,  in the last 168 hours CBC:  Recent Labs Lab 02/09/13 1000 02/10/13 0522  WBC 12.1* 8.2  NEUTROABS 10.3*  --   HGB 13.1 11.8*  HCT 38.7 35.5*  MCV 86.2 87.9  PLT 217 174   Cardiac Enzymes: No results found for this basename: CKTOTAL, CKMB, CKMBINDEX, TROPONINI,  in the last 168 hours BNP: BNP (last 3 results) No results found for this basename: PROBNP,  in the last 8760 hours CBG: No results found for this basename: GLUCAP,  in the last 168 hours     Signed:  Kevion Fatheree A  [facility] 02/11/2013, 10:57 AM

## 2013-02-10 NOTE — Progress Notes (Signed)
MD called and updated about patient status re: IV site infiltrated. MD to come and evaluate patient.

## 2013-02-10 NOTE — ED Provider Notes (Signed)
Medical screening examination/treatment/procedure(s) were conducted as a shared visit with non-physician practitioner(s) and myself.  I personally evaluated the patient during the encounter  Shelda Jakes, MD 02/10/13 [phone]

## 2013-02-11 DIAGNOSIS — R197 Diarrhea, unspecified: Secondary | ICD-10-CM

## 2013-02-11 DIAGNOSIS — C50919 Malignant neoplasm of unspecified site of unspecified female breast: Secondary | ICD-10-CM

## 2013-02-11 DIAGNOSIS — R112 Nausea with vomiting, unspecified: Secondary | ICD-10-CM

## 2013-02-11 NOTE — Progress Notes (Signed)
[facility] PROGRESS NOTE  AIDAN CALOCA OZH:[id number] DOB: [birth year] DOA: 02/09/2013 PCP: Dorrene German, MD  Assessment/Plan: Intractable nausea, vomiting and some diarrhea Likely due to acute viral gastroenteritis versus food poisoning. Improved with symptomatic treatment. Diet advanced to regular diet and currently tolerating it prior to discharge. Discharge held yesterday due to nausea and vomiting. No further episodes for dinner and breakfast.  Hypokalemia Secondary to nausea, vomiting, diarrhea and diuretics. Replete by mouth and IV and follow BMP closely.  Dehydration Resolved with IV fluids and oral intake.   Mild leukocytosis Likely stress margination, resolved.  Mild transaminitis Unclear etiology. Probably related to nausea and vomiting, improved from yesterday.   Hypertension Controlled. Resume diuretics at discharge.   Hyperlipidemia Continue statins.  History of breast cancer Continue Arimidex.  Code Status: Full code Family Communication: No family at bedside. Disposition Plan: DC home.  Consultants:  None  Procedures:  As above.  Antibiotics:  None.  HPI/Subjective: Had two episodes of nausea and vomiting yesterday, no further episodes since then.  Objective: Filed Vitals:   02/10/13 0939 02/10/13 1406 02/10/13 2149 02/11/13 0654  BP: 123/51 142/61 137/61 158/69  Pulse: 69 65 63 58  Temp: 99.1 F (37.3 C) 99.4 F (37.4 C) 99.7 F (37.6 C) 98.8 F (37.1 C)  TempSrc: Oral Oral Oral Oral  Resp: 20 18 18 18   Height:      Weight:      SpO2: 95% 96% 94% 95%    Intake/Output Summary (Last 24 hours) at 02/11/13 1054 Last data filed at 02/10/13 1406  Gross per 24 hour  Intake    240 ml  Output      0 ml  Net    240 ml   Filed Weights   02/09/13 1428 02/10/13 0559  Weight: 94.484 kg (208 lb 4.8 oz) 96.616 kg (213 lb)    Exam: Physical Exam: General: Awake, Oriented, No acute distress. HEENT: EOMI. Neck: Supple CV:  S1 and S2 Lungs: Clear to ascultation bilaterally Abdomen: Soft, Nontender, Nondistended, +bowel sounds. Ext: Good pulses. Trace edema.  Data Reviewed: Basic Metabolic Panel:  Recent Labs Lab 02/09/13 1000 02/09/13 1756 02/10/13 0522  NA 142 141 143  K 2.9* 3.1* 3.3*  CL 99 101 104  CO2 31 30 30   GLUCOSE 149* 118* 119*  BUN 22 17 14   CREATININE 0.92 0.92 0.92  CALCIUM 9.5 9.0 9.1   Liver Function Tests:  Recent Labs Lab 02/09/13 1000 02/10/13 0522  AST 60* 39*  ALT 51* 36*  ALKPHOS 71 58  BILITOT 0.7 0.7  PROT 7.4 6.2  ALBUMIN 3.9 3.2*    Recent Labs Lab 02/09/13 1000  LIPASE 32   No results found for this basename: AMMONIA,  in the last 168 hours CBC:  Recent Labs Lab 02/09/13 1000 02/10/13 0522  WBC 12.1* 8.2  NEUTROABS 10.3*  --   HGB 13.1 11.8*  HCT 38.7 35.5*  MCV 86.2 87.9  PLT 217 174   Cardiac Enzymes: No results found for this basename: CKTOTAL, CKMB, CKMBINDEX, TROPONINI,  in the last 168 hours BNP (last 3 results) No results found for this basename: PROBNP,  in the last 8760 hours CBG: No results found for this basename: GLUCAP,  in the last 168 hours  No results found for this or any previous visit (from the past 240 hour(s)).   Studies: No results found.  Scheduled Meds: . anastrozole  1 mg Oral Daily  . aspirin  81 mg Oral Daily  .  atorvastatin  40 mg Oral q1800  . enoxaparin (LOVENOX) injection  40 mg Subcutaneous Q24H  . fluticasone  1 spray Each Nare Daily  . loratadine  10 mg Oral Daily  . potassium chloride SA  20 mEq Oral Daily   Continuous Infusions:    Principal Problem:   Gastroenteritis, acute Active Problems:   Hyperlipidemia   Hypertension   Breast cancer   Dehydration   Hypokalemia   Leukocytosis   Transaminitis    Winta Barcelo A, MD  [facility] Pager [phone]. If 7PM-7AM, please contact night-coverage at [email or web address], password [facility] 02/11/2013, 10:54 AM  LOS: 2 days

## 2013-02-20 ENCOUNTER — Other Ambulatory Visit (HOSPITAL_BASED_OUTPATIENT_CLINIC_OR_DEPARTMENT_OTHER): Payer: Medicare Other | Admitting: Lab

## 2013-02-20 DIAGNOSIS — C50419 Malignant neoplasm of upper-outer quadrant of unspecified female breast: Secondary | ICD-10-CM

## 2013-02-20 DIAGNOSIS — M8580 Other specified disorders of bone density and structure, unspecified site: Secondary | ICD-10-CM

## 2013-02-20 LAB — COMPREHENSIVE METABOLIC PANEL (CC13)
ALT: 38 U/L (ref 0–55)
Alkaline Phosphatase: 70 U/L (ref 40–150)
Potassium: 3.5 mEq/L (ref 3.5–5.1)
Sodium: 139 mEq/L (ref 136–145)
Total Bilirubin: 0.79 mg/dL (ref 0.20–1.20)
Total Protein: 7.4 g/dL (ref 6.4–8.3)

## 2013-02-20 LAB — CBC WITH DIFFERENTIAL/PLATELET
EOS%: 2.3 % (ref 0.0–7.0)
Eosinophils Absolute: 0.2 10*3/uL (ref 0.0–0.5)
MCV: 86.3 fL (ref 79.5–101.0)
MONO%: 9.5 % (ref 0.0–14.0)
NEUT#: 6.7 10*3/uL — ABNORMAL HIGH (ref 1.5–6.5)
RBC: 4.65 10*6/uL (ref 3.70–5.45)
RDW: 14.3 % (ref 11.2–14.5)
lymph#: 2.4 10*3/uL (ref 0.9–3.3)

## 2013-02-21 LAB — CANCER ANTIGEN 27.29: CA 27.29: 22 U/mL (ref 0–39)

## 2013-02-27 ENCOUNTER — Telehealth: Payer: Self-pay | Admitting: Oncology

## 2013-02-27 ENCOUNTER — Ambulatory Visit (HOSPITAL_BASED_OUTPATIENT_CLINIC_OR_DEPARTMENT_OTHER): Payer: Medicare Other | Admitting: Oncology

## 2013-02-27 VITALS — BP 142/79 | HR 70 | Temp 98.5°F | Resp 20 | Ht 66.0 in | Wt 208.1 lb

## 2013-02-27 DIAGNOSIS — C50411 Malignant neoplasm of upper-outer quadrant of right female breast: Secondary | ICD-10-CM

## 2013-02-27 DIAGNOSIS — C50419 Malignant neoplasm of upper-outer quadrant of unspecified female breast: Secondary | ICD-10-CM

## 2013-02-27 MED ORDER — POTASSIUM CHLORIDE CRYS ER 20 MEQ PO TBCR: 20.0000 meq | EXTENDED_RELEASE_TABLET | Freq: Every day | ORAL | Status: DC

## 2013-02-27 MED ORDER — ANASTROZOLE 1 MG PO TABS: 1.0000 mg | ORAL_TABLET | Freq: Every day | ORAL | Status: DC

## 2013-02-27 NOTE — Telephone Encounter (Signed)
gv pt appt schedule for December. °

## 2013-02-27 NOTE — Progress Notes (Signed)
ID: [name]   DOB: 02/18/1946  MR#: [id number]  CSN#:[id number]  PCP: Dorrene German, MD GYN: SU: [name] OTHER MD:  HISTORY OF PRESENT ILLNESS: The patient underwent screening mammography 03/02/2012. (Prior mammogram was 2005). Possible mass was noted in the right breast and she was recalled for additional views 03/25/2012. These showed a dense spiculated mass with overlying skin retraction in the upper outer quadrant of the right breast. There were associated coarse linear calcifications. On physical exam there was an area of dimpling in the upper outer quadrant of the right breast and a 5 cm mass was palpable. By ultrasound, this was irregular, hypoechoic, and measured 3.3 cm. In addition, an abnormal lymph node in the right axilla with the thickened cortex measured 1.8 cm.  In the opposite breast, Dr. Whitney Muse found an irregular hypoechoic mass with lobulated margins measuring 2.3 cm. The left axilla was unremarkable. Bilateral breast MRI was obtained 04/01/2012, and showed the mass in question in the right breast to measure 3.8 cm maximally, with overlying skin retraction. In addition, there was a second enhancing bilobed mass in the lower outer quadrant of the right breast, measuring 1 point centimeter. This was felt to possibly represent an intramammary lymph node.  Biopsy of the right breast and right axillary lymph node 03/25/2012 both showed ([id number])  invasive ductal carcinoma, grade 2, estrogen and progesterone receptor positive, both at 100%, with an MIB-1 39%, and no HER-2 amplification. 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 After much discussion the patient decided on right modified radical mastectomy and left lumpectomy. Her subsequent history is as detailed below.  INTERVAL HISTORY: Christal returns today with her husband Fayrene Fearing for followup of her right breast carcinoma. The  interval history is chiefly remarkable for her having gone back to work part-time at [employer]. She and Fayrene Fearing have not had elected city for the last 3 days, but "they're managing".  REVIEW OF SYSTEMS: She is tolerating the anastrozole with hot flashes as her only significant symptom. She has some arthritis problems here in there but they are not more frequent extensive or intense than prior. She was admitted in February with the "intestinal flu", but those symptoms have completely resolved. She likes the fact that her hair has come back early. A detailed review of systems today was otherwise unremarkable.    PAST MEDICAL HISTORY: Past Medical History  Diagnosis Date  . Elevated cholesterol     takes Crestor daily  . Hypertension     takes Maxzide daily   . Phlebitis 24yrs ago    hx of-  . Cancer 03/25/12    right breast  . Seasonal allergies     takes Zyrtec daily and Nasal Spray prn  . Headache   . Arthritis   . Chronic back pain     arthritis  . Bruises easily   . Hemorrhoids   . Breast cancer 04/2012  . History of chemotherapy     x 1, pt unable to tolerate, on PO chemotherpay    PAST SURGICAL HISTORY: Past Surgical History  Procedure Laterality Date  . Appendectomy  36yrs ago  . Tubal ligation  71yrs ago  . Colonoscopy    . Portacath placement  04/26/2012    Procedure: INSERTION PORT-A-CATH;  Surgeon: [name], DO;  Location: [facility] OR;  Service: General;  Laterality: Left;  Started at 1746.  . Breast surgery  04/27/12    Right  mod mastectomy, ER/PR +, HER2 -  . Breast surgery  04/27/12    Left Breast NL lumpectomy-no malignancy  . Port-a-cath removal  11/24/2012    Procedure: REMOVAL PORT-A-CATH;  Surgeon: [name], DO;  Location: [facility];  Service: General;  Laterality: Left;    FAMILY HISTORY Family History  Problem Relation Age of Onset  . Cancer Mother     ovarian cancer  . Cancer Sister 51    colon cancer  . Cancer Brother 37    colon cancer  .  Cancer Brother     prostate cancer, deceased  . Anesthesia problems Neg Hx   . Hypotension Neg Hx   . Malignant hyperthermia Neg Hx   . Pseudochol deficiency Neg Hx   . Leukemia Father   . Cancer Father     leukemia  the patient's father died at the age of 36, from "leukemia." the patient's mother died at the age of 35, for what may have been uterine or ovarian cancer. The patient had 4 brothers and 2 sisters one sister has a history of colon cancer diagnosed at age 40 one brother has a history of colon cancer diagnosed at age 24 a second brother died from complications of prostate cancer. There is no other breast cancer in the family to her knowledge.  GYNECOLOGIC HISTORY: Menarche age 21, she is GX P5, first pregnancy to term age 19, last period approximately age 40. 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 Fayrene Fearing is disabled secondary to COPD. Ohanna had 5 children from a prior marriage. There are Annabell Sabal, 38, who was also present at the [facility] visit, and works for [employer]. Gershon Crane, 42, who is employed in Holiday representative, Hedwig Morton 46 who works as a Advertising copywriter, and Sports administrator 47 who works as a Lawyer for [employer]. The fifth child died at age 50. The patient has 13 grandchildren and 9 great grandchildren. She is not a Advice worker.   ADVANCED DIRECTIVES: not in place  HEALTH MAINTENANCE: History  Substance Use Topics  . Smoking status: Former Smoker    Quit date: 01/10/1984  . Smokeless tobacco: Never Used     Comment: quit 30+yrs ago  . Alcohol Use: No     Colonoscopy: 2007/ repeat is due  PAP: March 2012  Bone density: never  Lipid panel: per Dr. Mila Palmer  Allergies  Allergen Reactions  . Penicillins  Anaphylaxis    Current Outpatient Prescriptions  Medication Sig Dispense Refill  . anastrozole (ARIMIDEX) 1 MG tablet Take 1 mg by mouth daily.      Marland Kitchen aspirin 81 MG tablet Take 81 mg by mouth daily.      . Calcium Carbonate-Vitamin D (CALCIUM + D PO) Take 1 tablet by mouth daily.      . celecoxib (CELEBREX) 200 MG capsule Take 200 mg by mouth daily.      . cetirizine (ZYRTEC) 10 MG chewable tablet Chew 10 mg by mouth daily.      . fluticasone (VERAMYST) 27.5 MCG/SPRAY nasal spray Place 2 sprays into the nose daily.      . Multiple Vitamins-Minerals (CERTAVITE SENIOR/ANTIOXIDANT) TABS Take 1 tablet by mouth.      . ondansetron (ZOFRAN) 4 MG tablet Take 1 tablet (4 mg  total) by mouth every 6 (six) hours as needed for nausea.  20 tablet  0  . potassium chloride SA (K-DUR,KLOR-CON) 20 MEQ tablet Take 20 mEq by mouth daily.      . rosuvastatin (CRESTOR) 20 MG tablet Take 20 mg by mouth daily.      Marland Kitchen triamterene-hydrochlorothiazide (MAXZIDE-25) 37.5-25 MG per tablet Take 1 tablet by mouth daily.      Marland Kitchen UNABLE TO FIND Please excuse Ms. Gfeller from any work obligations from 02/09/2013 till 02/12/2013. Patient can return to work on 02/13/2013 without any restrictions.  1 Units  0   No current facility-administered medications for this visit.    OBJECTIVE: middle-aged white woman in no acute distress Filed Vitals:   02/27/13 0901  BP: 142/79  Pulse: 70  Temp: 98.5 F (36.9 C)  Resp: 20     Body mass index is 33.6 kg/(m^2).    ECOG FS: 0  Filed Weights   02/27/13 0901  Weight: 208 lb 1.6 oz (94.394 kg)   Sclerae unicteric  Oropharynx clear No peripheral adenopathy  Lungs clear to auscultation with no rales or rhonchi  Heart regular rate and rhythm  Abd benign, nontender, positive bowel sounds  MSK no focal spinal tenderness, no peripheral edema  Neuro: nonfocal. Well oriented. Pleasant affect. Breasts: The right breast is status post mastectomy. There is no evidence of local  recurrence. The left breast is unremarkable. Right axilla is benign  LAB RESULTS: Lab Results  Component Value Date   WBC 10.4* 02/20/2013   NEUTROABS 6.7* 02/20/2013   HGB 13.8 02/20/2013   HCT 40.1 02/20/2013   MCV 86.3 02/20/2013   PLT 190 02/20/2013      Chemistry      Component Value Date/Time   NA 139 02/20/2013 0758   NA 143 02/10/2013 0522   K 3.5 02/20/2013 0758   K 3.3* 02/10/2013 0522   CL 99 02/20/2013 0758   CL 104 02/10/2013 0522   CO2 28 02/20/2013 0758   CO2 30 02/10/2013 0522   BUN 18.0 02/20/2013 0758   BUN 14 02/10/2013 0522   CREATININE 1.0 02/20/2013 0758   CREATININE 0.92 02/10/2013 0522      Component Value Date/Time   CALCIUM 10.2 02/20/2013 0758   CALCIUM 9.1 02/10/2013 0522   ALKPHOS 70 02/20/2013 0758   ALKPHOS 58 02/10/2013 0522   AST 29 02/20/2013 0758   AST 39* 02/10/2013 0522   ALT 38 02/20/2013 0758   ALT 36* 02/10/2013 0522   BILITOT 0.79 02/20/2013 0758   BILITOT 0.7 02/10/2013 0522       Lab Results  Component Value Date   LABCA2 22 02/20/2013    STUDIES: No results found. Mammography in November 2013 was unremarkable   ASSESSMENT: 67 y.o.  [place] woman  (1) status post right modified radical mastectomy 04/26/2012 for a pT2 pN1, stage IIB invasive ductal carcinoma, grade 2, estrogen and progesterone both 100% positive, with an MIB-1 of 39%, and no HER-2 amplification.   (2) status post left lumpectomy 04/26/2012, benign  (3) adjuvant cyclophosphamide/ docetaxel given x1 (06/28/2012), the patient refusing further chemotherapy.  Declined radiation therapy.  (4) anastrozole started 07/21/2012  PLAN:  Jordi is doing well on the anastrozole other than issues with hot flashes. We reviewed her lab work today which is very favorable. The plan is to do anastrozole for a total of 5 years. She will see [place] again in December, after her next mammography. She knows to call for any  problems that may develop before the next visit.   MAGRINAT,GUSTAV C    02/27/2013

## 2013-04-26 ENCOUNTER — Ambulatory Visit (INDEPENDENT_AMBULATORY_CARE_PROVIDER_SITE_OTHER): Payer: Medicare Other | Admitting: General Surgery

## 2013-04-26 ENCOUNTER — Encounter (INDEPENDENT_AMBULATORY_CARE_PROVIDER_SITE_OTHER): Payer: Self-pay | Admitting: General Surgery

## 2013-04-26 VITALS — BP 130/84 | HR 78 | Temp 97.0°F | Resp 16 | Ht 66.0 in | Wt 202.4 lb

## 2013-04-26 DIAGNOSIS — Z853 Personal history of malignant neoplasm of breast: Secondary | ICD-10-CM

## 2013-04-26 NOTE — Progress Notes (Signed)
Subjective:     Patient ID: Yvonne [name], female   DOB: 12-01-1946, 67 y.o.   MRN: [id number]  HPI This patient follows up status post right modified radical mastectomyfor a T2, N1, M0 right breast cancer. She also had a lumpectomy on the left. She comes in today and has no complaints. She says that she is taking her hormone pills as already prescribed by her oncologist and does not have any issues with these. She is doing her self breast exam and denies any abnormalities or new changes. She denies any systemic symptoms such as headaches, bony pains, or abnormal weight loss. She had a mammogram in November which was BI-RADS 2 without any suspicious findings. And a  Review of Systems     Objective:   Physical Exam She is in no acute distress and nontoxic-appearing Third right breast shows normal post meniscectomy changes. Her incision is well healed without any nodularity were suspicious masses. There is no axillary masses. Her left breast shows a well-healed lumpectomy scar with the cosmesis. She has no suspicious masses or lymphadenopathy    Assessment:     History of right breast cancer-doing well She seems to be doing very well and has no evidence of recurrent disease. She continues on her hormones and is also followed by oncology. Her mammogram was BI-RADS 2 in November and she is scheduled for another mammogram in November again. I recommended that she continue with her monthly self breast exams and we will see her back in November or December after her next mammogram for repeat clinical exam     Plan:     Continue hormones Continue months of breast exam Followup mammogram in November and we will see her back around that time for repeat exam

## 2013-10-16 ENCOUNTER — Other Ambulatory Visit (INDEPENDENT_AMBULATORY_CARE_PROVIDER_SITE_OTHER): Payer: Self-pay | Admitting: General Surgery

## 2013-10-16 DIAGNOSIS — Z853 Personal history of malignant neoplasm of breast: Secondary | ICD-10-CM

## 2013-11-01 ENCOUNTER — Other Ambulatory Visit (INDEPENDENT_AMBULATORY_CARE_PROVIDER_SITE_OTHER): Payer: Self-pay | Admitting: General Surgery

## 2013-11-01 ENCOUNTER — Other Ambulatory Visit: Payer: Self-pay

## 2013-11-01 DIAGNOSIS — Z853 Personal history of malignant neoplasm of breast: Secondary | ICD-10-CM

## 2013-11-07 ENCOUNTER — Telehealth (INDEPENDENT_AMBULATORY_CARE_PROVIDER_SITE_OTHER): Payer: Self-pay

## 2013-11-07 NOTE — Telephone Encounter (Signed)
Called patient left message on voicemail.  Patient will need to follow up in our office after [facility] and any other imaging, biopsies if needed with Dr. Donell Beers.

## 2013-11-07 NOTE — Telephone Encounter (Signed)
Faxed STAT order to [facility] for Diagnostic Bilateral MGM, with possible US Guided Core Biopsy and Stereotactic Biopsy if needed. Dr. Donell Beers signed the order and order faxed back to [phone].  Fax confirmation rec'd and attached to outgoing fax.

## 2013-11-08 ENCOUNTER — Ambulatory Visit
Admission: RE | Admit: 2013-11-08 | Discharge: 2013-11-08 | Disposition: A | Discharge status: Home / Self Care | Payer: Medicare Other | Source: Ambulatory Visit | Attending: General Surgery | Admitting: General Surgery

## 2013-11-08 DIAGNOSIS — Z853 Personal history of malignant neoplasm of breast: Secondary | ICD-10-CM

## 2013-11-20 ENCOUNTER — Other Ambulatory Visit (HOSPITAL_BASED_OUTPATIENT_CLINIC_OR_DEPARTMENT_OTHER): Payer: Medicare Other | Admitting: Lab

## 2013-11-20 ENCOUNTER — Encounter: Payer: Self-pay | Admitting: Physician Assistant

## 2013-11-20 ENCOUNTER — Encounter (INDEPENDENT_AMBULATORY_CARE_PROVIDER_SITE_OTHER): Payer: Self-pay

## 2013-11-20 ENCOUNTER — Telehealth: Payer: Self-pay | Admitting: Oncology

## 2013-11-20 ENCOUNTER — Ambulatory Visit (HOSPITAL_BASED_OUTPATIENT_CLINIC_OR_DEPARTMENT_OTHER): Payer: Medicare Other | Admitting: Physician Assistant

## 2013-11-20 ENCOUNTER — Other Ambulatory Visit: Payer: Self-pay | Admitting: Physician Assistant

## 2013-11-20 VITALS — BP 127/79 | HR 81 | Temp 98.2°F | Resp 18 | Ht 66.0 in | Wt 206.2 lb

## 2013-11-20 DIAGNOSIS — C50411 Malignant neoplasm of upper-outer quadrant of right female breast: Secondary | ICD-10-CM

## 2013-11-20 DIAGNOSIS — E876 Hypokalemia: Secondary | ICD-10-CM

## 2013-11-20 DIAGNOSIS — Z780 Asymptomatic menopausal state: Secondary | ICD-10-CM

## 2013-11-20 DIAGNOSIS — C773 Secondary and unspecified malignant neoplasm of axilla and upper limb lymph nodes: Secondary | ICD-10-CM

## 2013-11-20 DIAGNOSIS — R071 Chest pain on breathing: Secondary | ICD-10-CM

## 2013-11-20 DIAGNOSIS — Z853 Personal history of malignant neoplasm of breast: Secondary | ICD-10-CM

## 2013-11-20 DIAGNOSIS — R232 Flushing: Secondary | ICD-10-CM | POA: Insufficient documentation

## 2013-11-20 DIAGNOSIS — M899 Disorder of bone, unspecified: Secondary | ICD-10-CM

## 2013-11-20 DIAGNOSIS — M8580 Other specified disorders of bone density and structure, unspecified site: Secondary | ICD-10-CM

## 2013-11-20 DIAGNOSIS — C50419 Malignant neoplasm of upper-outer quadrant of unspecified female breast: Secondary | ICD-10-CM

## 2013-11-20 LAB — CBC WITH DIFFERENTIAL/PLATELET
Basophils Absolute: 0.1 10*3/uL (ref 0.0–0.1)
EOS%: 3.2 % (ref 0.0–7.0)
HCT: 39.4 % (ref 34.8–46.6)
HGB: 12.9 g/dL (ref 11.6–15.9)
LYMPH%: 39 % (ref 14.0–49.7)
MCH: 28.5 pg (ref 25.1–34.0)
MCV: 87.3 fL (ref 79.5–101.0)
MONO%: 8 % (ref 0.0–14.0)
NEUT%: 48.7 % (ref 38.4–76.8)
Platelets: 190 10*3/uL (ref 145–400)
RDW: 14.4 % (ref 11.2–14.5)

## 2013-11-20 LAB — COMPREHENSIVE METABOLIC PANEL (CC13)
AST: 38 U/L — ABNORMAL HIGH (ref 5–34)
Alkaline Phosphatase: 63 U/L (ref 40–150)
BUN: 20.1 mg/dL (ref 7.0–26.0)
Creatinine: 1 mg/dL (ref 0.6–1.1)
Glucose: 117 mg/dl (ref 70–140)
Total Bilirubin: 0.6 mg/dL (ref 0.20–1.20)

## 2013-11-20 MED ORDER — POTASSIUM CHLORIDE CRYS ER 20 MEQ PO TBCR: 20.0000 meq | EXTENDED_RELEASE_TABLET | Freq: Two times a day (BID) | ORAL | Status: DC

## 2013-11-20 MED ORDER — GABAPENTIN 300 MG PO CAPS: 300.0000 mg | ORAL_CAPSULE | Freq: Every day | ORAL | Status: DC

## 2013-11-20 NOTE — Progress Notes (Signed)
ID: [name]   DOB: Jan 21, 1946  MR#: [id number]  CSN#:[id number]  PCP: Karna Dupes GYN: SU: [name], MD OTHER MD:  CHIEF COMPLAINT:  Right Breast Cancer   HISTORY OF PRESENT ILLNESS: The patient underwent screening mammography 03/02/2012. (Prior mammogram was 2005). Possible mass was noted in the right breast and she was recalled for additional views 03/25/2012. These showed a dense spiculated mass with overlying skin retraction in the upper outer quadrant of the right breast. There were associated coarse linear calcifications. On physical exam there was an area of dimpling in the upper outer quadrant of the right breast and a 5 cm mass was palpable. By ultrasound, this was irregular, hypoechoic, and measured 3.3 cm. In addition, an abnormal lymph node in the right axilla with the thickened cortex measured 1.8 cm.  In the opposite breast, Dr. Whitney Muse found an irregular hypoechoic mass with lobulated margins measuring 2.3 cm. The left axilla was unremarkable. Bilateral breast MRI was obtained 04/01/2012, and showed the mass in question in the right breast to measure 3.8 cm maximally, with overlying skin retraction. In addition, there was a second enhancing bilobed mass in the lower outer quadrant of the right breast, measuring 1 point centimeter. This was felt to possibly represent an intramammary lymph node.  Biopsy of the right breast and right axillary lymph node 03/25/2012 both showed ([id number])  invasive ductal carcinoma, grade 2, estrogen and progesterone receptor positive, both at 100%, with an MIB-1 39%, and no HER-2 amplification. 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 After much discussion the patient decided on right modified radical mastectomy and left lumpectomy.   Her subsequent history is as detailed below.  INTERVAL HISTORY: Harmony returns today with her husband  Fayrene Fearing for followup of her right breast carcinoma. She is feeling well and is back to work full time now. She tells me retirement was "just not for her". Her energy level is good. Her biggest complaints are some postsurgical pain in the right chest wall, and some hot flashes at night associated with the anastrozole.   REVIEW OF SYSTEMS: Asuncion has had no recent illnesses and denies any fevers or chills. She's had no skin changes and denies any abnormal bruising or bleeding. Her appetite is good and she denies any nausea or change in bowel or bladder habits. She denies any cough, shortness of breath, chest pain, or palpitations. She's had no abnormal headaches or dizziness. She has some "twinges" of pain in the right chest wall occasionally, status post mastectomy. She has no increased arthralgias and denies any new myalgias or bony pain. She has some lymphedema in the right upper extremity but tells me this is mild.   A detailed review of systems today was otherwise unremarkable.    PAST MEDICAL HISTORY: Past Medical History  Diagnosis Date  . Elevated cholesterol     takes Crestor daily  . Hypertension     takes Maxzide daily   . Phlebitis 77yrs ago    hx of-  . Cancer 03/25/12    right breast  . Seasonal allergies     takes Zyrtec daily and Nasal Spray prn  . Headache(784.0)   . Arthritis   . Chronic back pain     arthritis  . Bruises easily   . Hemorrhoids   . Breast cancer 04/2012  . History of chemotherapy     x 1, pt unable to tolerate, on PO chemotherpay  PAST SURGICAL HISTORY: Past Surgical History  Procedure Laterality Date  . Appendectomy  65yrs ago  . Tubal ligation  54yrs ago  . Colonoscopy    . Portacath placement  04/26/2012    Procedure: INSERTION PORT-A-CATH;  Surgeon: [name], DO;  Location: [facility] OR;  Service: General;  Laterality: Left;  Started at 1746.  . Breast surgery  04/27/12    Right mod mastectomy, ER/PR +, HER2 -  . Breast surgery  04/27/12    Left  Breast NL lumpectomy-no malignancy  . Port-a-cath removal  11/24/2012    Procedure: REMOVAL PORT-A-CATH;  Surgeon: [name], DO;  Location: [facility];  Service: General;  Laterality: Left;    FAMILY HISTORY Family History  Problem Relation Age of Onset  . Cancer Mother     ovarian cancer  . Cancer Sister 45    colon cancer  . Cancer Brother 61    colon cancer  . Cancer Brother     prostate cancer, deceased  . Anesthesia problems Neg Hx   . Hypotension Neg Hx   . Malignant hyperthermia Neg Hx   . Pseudochol deficiency Neg Hx   . Leukemia Father   . Cancer Father     leukemia  the patient's father died at the age of 58, from "leukemia." the patient's mother died at the age of 37, for what may have been uterine or ovarian cancer. The patient had 4 brothers and 2 sisters one sister has a history of colon cancer diagnosed at age 60 one brother has a history of colon cancer diagnosed at age 11 a second brother died from complications of prostate cancer. There is no other breast cancer in the family to her knowledge.  GYNECOLOGIC HISTORY: Menarche age 27, she is GX P5, first pregnancy to term age 24, last period approximately age 5. 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 She is married to [facility], who was present at her multidisciplinary breast cancer clinic visit. Fayrene Fearing is disabled secondary to COPD. Preeya had 5 children from a prior marriage. There are Annabell Sabal, 38, who was also present at the [facility] visit, and works for [employer]. Gershon Crane, 42, who is employed in Holiday [employer], Hedwig Morton 46 who works as a Advertising copywriter, and Sports administrator 47 who works as a Lawyer for [employer]. The fifth child died at age 70. The patient has 13 grandchildren and 9 great grandchildren. She is not a Counsellor.   ADVANCED DIRECTIVES: not in place  HEALTH MAINTENANCE: (Updated November 2014) History  Substance Use Topics  . Smoking status: Former Smoker    Quit date: 01/10/1984  . Smokeless tobacco: Never Used     Comment: quit 30+yrs ago  . Alcohol Use: No     Colonoscopy: 2007/ repeat is due  PAP: March 2012  Bone density:2005, osteopenia  Lipid panel:    Allergies  Allergen Reactions  . Penicillins Anaphylaxis    Current Outpatient Prescriptions  Medication Sig Dispense Refill  . anastrozole (ARIMIDEX) 1 MG tablet Take 1 tablet (1 mg total) by mouth daily.  90 tablet  12  . aspirin 81 MG tablet Take 81 mg by mouth daily.      Marland Kitchen BAYER CONTOUR NEXT TEST test strip       . Calcium Carbonate-Vitamin D (CALCIUM + D PO) Take  1 tablet by mouth daily.      . celecoxib (CELEBREX) 200 MG capsule Take 200 mg by mouth daily.      . cetirizine (ZYRTEC) 10 MG chewable tablet Chew 10 mg by mouth daily.      . fluticasone (VERAMYST) 27.5 MCG/SPRAY nasal spray Place 2 sprays into the nose daily.      . metFORMIN (GLUCOPHAGE) 500 MG tablet       . Multiple Vitamins-Minerals (CERTAVITE SENIOR/ANTIOXIDANT) TABS Take 1 tablet by mouth.      . potassium chloride SA (K-DUR,KLOR-CON) 20 MEQ tablet Take 1 tablet (20 mEq total) by mouth 2 (two) times daily.  180 tablet  3  . rosuvastatin (CRESTOR) 20 MG tablet Take 20 mg by mouth daily.      Marland Kitchen triamterene-hydrochlorothiazide (MAXZIDE-25) 37.5-25 MG per tablet Take 1 tablet by mouth daily.      Marland Kitchen UNABLE TO FIND Please excuse Ms. Shonk from any work obligations from 02/09/2013 till 02/12/2013. Patient can return to work on 02/13/2013 without any restrictions.  1 Units  0  . ZETIA 10 MG tablet       . gabapentin (NEURONTIN) 300 MG capsule Take 1 capsule (300 mg total) by mouth at bedtime.  90 capsule  3  . ondansetron (ZOFRAN) 4 MG tablet Take 1 tablet (4 mg total) by mouth every 6 (six) hours as needed for nausea.  20 tablet  0   No current  facility-administered medications for this visit.    OBJECTIVE: middle-aged white woman who appears comfortable and is in no acute distress Filed Vitals:   11/20/13 0847  BP: 127/79  Pulse: 81  Temp: 98.2 F (36.8 C)  Resp: 18     Body mass index is 33.3 kg/(m^2).    ECOG FS: 0 Filed Weights   11/20/13 0847  Weight: 206 lb 3.2 oz (93.532 kg)   Physical Exam: HEENT:  Sclerae anicteric.  Oropharynx clear. Poor dentition NODES:  No cervical or supraclavicular lymphadenopathy palpated.  BREAST EXAM: Patient is status post right mastectomy with no suspicious nodules, skin changes, or evidence of local recurrence. Left breast is unremarkable. Axillae are benign bilaterally, with no palpable lymphadenopathy. LUNGS:  Clear to auscultation bilaterally.  No wheezes or rhonchi HEART:  Regular rate and rhythm. No murmur appreciated. ABDOMEN:  Soft, nontender.  Positive bowel sounds.  MSK:  No focal spinal tenderness to palpation. Good range of motion in the upper extremities. EXTREMITIES: Mild lymphedema noted in the upper portion of the right arm, nonpitting. No additional peripheral edema is noted. NEURO:  Nonfocal. Well oriented.  Positive affect.    LAB RESULTS: Lab Results  Component Value Date   WBC 7.9 11/20/2013   NEUTROABS 3.8 11/20/2013   HGB 12.9 11/20/2013   HCT 39.4 11/20/2013   MCV 87.3 11/20/2013   PLT 190 11/20/2013      Chemistry      Component Value Date/Time   NA 140 11/20/2013 0805   NA 143 02/10/2013 0522   K 3.2* 11/20/2013 0805   K 3.3* 02/10/2013 0522   CL 99 02/20/2013 0758   CL 104 02/10/2013 0522   CO2 27 11/20/2013 0805   CO2 30 02/10/2013 0522   BUN 20.1 11/20/2013 0805   BUN 14 02/10/2013 0522   CREATININE 1.0 11/20/2013 0805   CREATININE 0.92 02/10/2013 0522      Component Value Date/Time   CALCIUM 10.1 11/20/2013 0805   CALCIUM 9.1 02/10/2013 0522   ALKPHOS 63 11/20/2013 0805  ALKPHOS 58 02/10/2013 0522   AST 38* 11/20/2013 0805   AST 39* 02/10/2013 0522    ALT 33 11/20/2013 0805   ALT 36* 02/10/2013 0522   BILITOT 0.60 11/20/2013 0805   BILITOT 0.7 02/10/2013 0522      STUDIES:  Most recent bone density on file was from February 2005 at [facility], showing osteopenia with a T score of -1.75.  [facility] was unremarkable.   ASSESSMENT: 67 y.o.  [place] woman  (1) status post right modified radical mastectomy 04/26/2012 for a pT2 pN1, stage IIB invasive ductal carcinoma, grade 2, estrogen and progesterone both 100% positive, with an MIB-1 of 39%, and no HER-2 amplification.   (2) status post left lumpectomy 04/26/2012, benign  (3) adjuvant cyclophosphamide/ docetaxel given x1 (06/28/2012), the patient refusing further chemotherapy.  Declined radiation therapy.  (4) anastrozole started 07/21/2012, the plan being to continue for total of 5 years (until August 2018)  PLAN:  [place] continues to tolerate the anastrozole very well and will continue with her current regimen.   I have suggested trying gabapentin, 300 mg at bedtime, which I think will help with both her postsurgical pain on the right side, and her hot flashes which are attributable to the anastrozole. She is currently taking 20 mEq of potassium daily, and I have increases this to twice daily.  She does need a repeat bone density and that is being ordered for January. We'll plan on seeing her back again in 6 months, late May or early June of 2015, to repeat labs, and also to review the results of the bone density. If necessary, we will discuss treatment with bisphosphonates at that time. We will also continue to follow her liver enzymes although there is no trend in the occasional elevation.   If she is still doing well, we will initiate annual visits, and will alternate followups with Dr. Biagio Quint every 6 months. Soumya voices understanding and agreement with this plan, and will call if she has any changes or problems prior to her next  appointment.     Dellanira Dillow PA-C     11/20/2013

## 2013-11-20 NOTE — Telephone Encounter (Signed)
, °

## 2014-04-30 ENCOUNTER — Other Ambulatory Visit: Payer: Self-pay | Admitting: Oncology

## 2014-05-15 ENCOUNTER — Telehealth: Payer: Self-pay | Admitting: *Deleted

## 2014-05-15 NOTE — Telephone Encounter (Signed)
Called pt and informed her that Dr. Jana Hakim is unable to see her that day and that we needed to reschedule her.  She was fine with it and I confirmed 06/06/14 appt w/ Amy w/ pt.

## 2014-05-28 ENCOUNTER — Other Ambulatory Visit (HOSPITAL_BASED_OUTPATIENT_CLINIC_OR_DEPARTMENT_OTHER): Payer: Medicare HMO

## 2014-05-28 DIAGNOSIS — C773 Secondary and unspecified malignant neoplasm of axilla and upper limb lymph nodes: Secondary | ICD-10-CM

## 2014-05-28 DIAGNOSIS — M8580 Other specified disorders of bone density and structure, unspecified site: Secondary | ICD-10-CM

## 2014-05-28 DIAGNOSIS — Z853 Personal history of malignant neoplasm of breast: Secondary | ICD-10-CM

## 2014-05-28 DIAGNOSIS — C50419 Malignant neoplasm of upper-outer quadrant of unspecified female breast: Secondary | ICD-10-CM

## 2014-05-28 LAB — CBC WITH DIFFERENTIAL/PLATELET
BASO%: 1.1 % (ref 0.0–2.0)
BASOS ABS: 0.1 10*3/uL (ref 0.0–0.1)
EOS ABS: 0.2 10*3/uL (ref 0.0–0.5)
EOS%: 3.2 % (ref 0.0–7.0)
HCT: 40.6 % (ref 34.8–46.6)
HEMOGLOBIN: 13.4 g/dL (ref 11.6–15.9)
LYMPH#: 2.4 10*3/uL (ref 0.9–3.3)
LYMPH%: 34.6 % (ref 14.0–49.7)
MCH: 28.8 pg (ref 25.1–34.0)
MCHC: 32.9 g/dL (ref 31.5–36.0)
MCV: 87.4 fL (ref 79.5–101.0)
MONO#: 0.6 10*3/uL (ref 0.1–0.9)
MONO%: 8.1 % (ref 0.0–14.0)
NEUT%: 53 % (ref 38.4–76.8)
NEUTROS ABS: 3.7 10*3/uL (ref 1.5–6.5)
PLATELETS: 203 10*3/uL (ref 145–400)
RBC: 4.64 10*6/uL (ref 3.70–5.45)
RDW: 14 % (ref 11.2–14.5)
WBC: 6.9 10*3/uL (ref 3.9–10.3)

## 2014-05-28 LAB — COMPREHENSIVE METABOLIC PANEL (CC13)
ALT: 30 U/L (ref 0–55)
ANION GAP: 11 meq/L (ref 3–11)
AST: 40 U/L — ABNORMAL HIGH (ref 5–34)
Albumin: 3.9 g/dL (ref 3.5–5.0)
Alkaline Phosphatase: 56 U/L (ref 40–150)
BUN: 21.3 mg/dL (ref 7.0–26.0)
CHLORIDE: 103 meq/L (ref 98–109)
CO2: 28 meq/L (ref 22–29)
CREATININE: 1 mg/dL (ref 0.6–1.1)
Calcium: 10.3 mg/dL (ref 8.4–10.4)
GLUCOSE: 106 mg/dL (ref 70–140)
Potassium: 4.1 mEq/L (ref 3.5–5.1)
Sodium: 142 mEq/L (ref 136–145)
Total Bilirubin: 0.68 mg/dL (ref 0.20–1.20)
Total Protein: 7.4 g/dL (ref 6.4–8.3)

## 2014-05-29 LAB — VITAMIN D 25 HYDROXY (VIT D DEFICIENCY, FRACTURES): Vit D, 25-Hydroxy: 53 ng/mL (ref 30–89)

## 2014-06-04 ENCOUNTER — Ambulatory Visit: Payer: Medicare Other | Admitting: Oncology

## 2014-06-06 ENCOUNTER — Encounter: Payer: Self-pay | Admitting: Physician Assistant

## 2014-06-06 ENCOUNTER — Ambulatory Visit (HOSPITAL_BASED_OUTPATIENT_CLINIC_OR_DEPARTMENT_OTHER): Payer: Medicare HMO | Admitting: Physician Assistant

## 2014-06-06 VITALS — BP 142/76 | HR 72 | Temp 98.1°F | Resp 20 | Ht 66.0 in | Wt 205.1 lb

## 2014-06-06 DIAGNOSIS — E876 Hypokalemia: Secondary | ICD-10-CM

## 2014-06-06 DIAGNOSIS — C773 Secondary and unspecified malignant neoplasm of axilla and upper limb lymph nodes: Secondary | ICD-10-CM

## 2014-06-06 DIAGNOSIS — C50419 Malignant neoplasm of upper-outer quadrant of unspecified female breast: Secondary | ICD-10-CM

## 2014-06-06 DIAGNOSIS — R232 Flushing: Secondary | ICD-10-CM

## 2014-06-06 DIAGNOSIS — M8580 Other specified disorders of bone density and structure, unspecified site: Secondary | ICD-10-CM

## 2014-06-06 DIAGNOSIS — R748 Abnormal levels of other serum enzymes: Secondary | ICD-10-CM

## 2014-06-06 DIAGNOSIS — Z170 Estrogen receptor positive status [ER+]: Secondary | ICD-10-CM

## 2014-06-06 DIAGNOSIS — Z780 Asymptomatic menopausal state: Secondary | ICD-10-CM

## 2014-06-06 DIAGNOSIS — Z853 Personal history of malignant neoplasm of breast: Secondary | ICD-10-CM

## 2014-06-06 DIAGNOSIS — T451X5A Adverse effect of antineoplastic and immunosuppressive drugs, initial encounter: Secondary | ICD-10-CM

## 2014-06-06 MED ORDER — POTASSIUM CHLORIDE CRYS ER 20 MEQ PO TBCR: 20.0000 meq | EXTENDED_RELEASE_TABLET | Freq: Two times a day (BID) | ORAL | Status: DC

## 2014-06-06 NOTE — Progress Notes (Signed)
ID: Yvonne [name]   DOB: 10-05-1946  MR#: [id number]  XHB#:[id number]  PCP: Yvonne [name],O'LAF, PA-C GYN: SU: Yvonne Hook, MD OTHER MD:  CHIEF COMPLAINT:  Hx of Right Breast Cancer (anastrazole)   HISTORY OF PRESENT ILLNESS: The patient underwent screening mammography 03/02/2012. (Prior mammogram was 2005). Possible mass was noted in the right breast and she was recalled for additional views 03/25/2012. These showed a dense spiculated mass with overlying skin retraction in the upper outer quadrant of the right breast. There were associated coarse linear calcifications. On physical exam there was an area of dimpling in the upper outer quadrant of the right breast and a 5 cm mass was palpable. By ultrasound, this was irregular, hypoechoic, and measured 3.3 cm. In addition, an abnormal lymph node in the right axilla with the thickened cortex measured 1.8 cm.  In the opposite breast, Dr. Valinda [name] found an irregular hypoechoic mass with lobulated margins measuring 2.3 cm. The left axilla was unremarkable. Bilateral breast MRI was obtained 04/01/2012, and showed the mass in question in the right breast to measure 3.8 cm maximally, with overlying skin retraction. In addition, there was a second enhancing bilobed mass in the lower outer quadrant of the right breast, measuring 1 point centimeter. This was felt to possibly represent an intramammary lymph node.  Biopsy of the right breast and right axillary lymph node 03/25/2012 both showed ([id number])  invasive ductal carcinoma, grade 2, estrogen and progesterone receptor positive, both at 100%, with an MIB-1 39%, and no HER-2 amplification. 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 After much discussion the patient decided on right modified radical mastectomy and left lumpectomy.   Her subsequent history is as detailed below.  INTERVAL HISTORY: Yvonne [name] returns  today with her husband Yvonne [name] for followup of her right breast carcinoma. Interval history is generally unremarkable, and Yvonne [name] is feeling well. She continues on anastrozole with few complaints. She does have some hot flashes, both during the day and at night, but does not find them particularly problematic. Her energy level is good, and she is back to work full time.   REVIEW OF SYSTEMS: Yvonne [name] denies any recent illnesses and has had no fevers or chills. She's had no skin changes or rashes and denies any abnormal bruising or bleeding. She's had no abnormal vaginal bleeding, and also denies any vaginal dryness. Her appetite is good and she denies any nausea or change in bowel or bladder habits. She has some sinus congestion and seasonal allergy symptoms. She denies any increased cough, phlegm production, shortness of breath, peripheral swelling, chest pain, or palpitations. She's had no abnormal headaches or dizziness. She denies any unusual myalgias, arthralgias, or bony pain. She has had some lymphedema in the right upper extremity in the past, but this is been very mild and currently is not a problem.  A detailed review of systems today was otherwise unremarkable.    PAST MEDICAL HISTORY: Past Medical History  Diagnosis Date  . Elevated cholesterol     takes Crestor daily  . Hypertension     takes Maxzide daily   . Phlebitis 41yr ago    hx of-  . Cancer 03/25/12    right breast  . Seasonal allergies     takes Zyrtec daily and Nasal Spray prn  . Headache(784.0)   . Arthritis   . Chronic back pain     arthritis  . Bruises easily   . Hemorrhoids   .  Breast cancer 04/2012  . History of chemotherapy     x 1, pt unable to tolerate, on PO chemotherpay    PAST SURGICAL HISTORY: Past Surgical History  Procedure Laterality Date  . Appendectomy  35yr ago  . Tubal ligation  89yrago  . Colonoscopy    . Portacath placement  04/26/2012    Procedure: INSERTION PORT-A-CATH;  Surgeon: [name];  [facility];  Laterality: Left;  Started at 1746.  . Breast surgery  04/27/12    Right mod mastectomy, ER/PR +, HER2 -  . Breast surgery  04/27/12    Left Breast NL lumpectomy-no malignancy  . Port-a-cath removal  11/24/2012    Procedure: REMOVAL PORT-A-CATH;  Surgeon: [name];  [facility];  Laterality: Left;    FAMILY HISTORY Family History  Problem Relation Age of Onset  . Cancer Mother     ovarian cancer  . Cancer Sister [id number]  colon cancer  . Cancer Brother 6061  colon cancer  . Cancer Brother     prostate cancer, deceased  . Anesthesia problems Neg Hx   . Hypotension Neg Hx   . Malignant hyperthermia Neg Hx   . Pseudochol deficiency Neg Hx   . Leukemia Father   . Cancer Father     leukemia  the patient's father died at the age of 9254from "leukemia." the patient's mother died at the age of 5347for what may have been uterine or ovarian cancer. The patient had 4 brothers and 2 sisters one sister has a history of colon cancer diagnosed at age 6132ne brother has a history of colon cancer diagnosed at age 67 second brother died from complications of prostate cancer. There is no other breast cancer in the family to her knowledge.  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 She is married to JaHardin County General Hospitalwho is disabled secondary to COPD. JuEylaad 5 children from a prior marriage. There are KiSuzi Roots39, who was also present at the [facility], and works for Wa[employer]MiJason Coop42, who is employed in coArchitectSaHavery Moros6 who works as a hoSecretary/administratorand TaProduction manager751ho works as a  CNQuarry manageror GuOffice DepotThe fifth child died at age 5647The patient has 13 grandchildren and 9 great grandchildren. She is not a chAmbulance person  ADVANCED DIRECTIVES: not in place  HEALTH MAINTENANCE: (Updated 06/06/2014) History  Substance Use Topics  . Smoking status: Former Smoker    Quit date: 01/10/1984  . Smokeless tobacco: Never Used     Comment: quit 30+yrs ago  . Alcohol Use: No     Colonoscopy: 2007/ repeat is due  PAP: March 2012  Bone density:2005, osteopenia  Lipid panel:  Not on file   Allergies  Allergen Reactions  . Penicillins Anaphylaxis    Current Outpatient Prescriptions  Medication Sig Dispense Refill  . anastrozole (ARIMIDEX) 1 MG tablet TAKE 1 TABLET BY MOUTH ONCE DAILY  90 tablet  3  . aspirin 81 MG tablet Take 81 mg by mouth daily.      . Marland KitchenAYER CONTOUR NEXT TEST test  strip       . Calcium Carbonate-Vitamin D (CALCIUM + D PO) Take 1 tablet by mouth daily.      . celecoxib (CELEBREX) 200 MG capsule Take 200 mg by mouth daily.      . cetirizine (ZYRTEC) 10 MG chewable tablet Chew 10 mg by mouth daily.      . fluticasone (FLONASE) 50 MCG/ACT nasal spray       . fluticasone (VERAMYST) 27.5 MCG/SPRAY nasal spray Place 2 sprays into the nose daily.      Marland Kitchen gabapentin (NEURONTIN) 300 MG capsule Take 1 capsule (300 mg total) by mouth at bedtime.  90 capsule  3  . ibuprofen (ADVIL,MOTRIN) 800 MG tablet       . metFORMIN (GLUCOPHAGE) 500 MG tablet       . Multiple Vitamins-Minerals (CERTAVITE SENIOR/ANTIOXIDANT) TABS Take 1 tablet by mouth.      . potassium chloride SA (K-DUR,KLOR-CON) 20 MEQ tablet Take 1 tablet (20 mEq total) by mouth 2 (two) times daily.  180 tablet  3  . rosuvastatin (CRESTOR) 20 MG tablet Take 20 mg by mouth daily.      Marland Kitchen triamterene-hydrochlorothiazide (MAXZIDE-25) 37.5-25 MG per tablet Take 1 tablet by mouth daily.      Marland Kitchen ZETIA 10 MG tablet Pt takes 1/2 tablet in am and 1/2 tablet in pm      . UNABLE TO FIND Please excuse Ms.  Sinagra from any work obligations from 02/09/2013 till 02/12/2013. Patient can return to work on 02/13/2013 without any restrictions.  1 Units  0   No current facility-administered medications for this visit.    OBJECTIVE: middle-aged white woman who appears well  Filed Vitals:   06/06/14 1046  BP: 142/76  Pulse: 72  Temp: 98.1 F (36.7 C)  Resp: 20     Body mass index is 33.12 kg/(m^2).    ECOG FS: 0 Filed Weights   06/06/14 1046  Weight: 205 lb 1.6 oz (93.033 kg)   Physical Exam: HEENT:  Sclerae anicteric.  Oropharynx clear. Buccal mucosa is pink and moist. Poor dentition. Neck supple, trachea midline. NODES:  No cervical or supraclavicular lymphadenopathy palpated.  BREAST EXAM: Patient is status post right mastectomy with no suspicious nodules, skin changes, or evidence of local recurrence. Left breast is unremarkable. Axillae are benign bilaterally, with no palpable lymphadenopathy. LUNGS:  Clear to auscultation bilaterally with good excursion.  No wheezes or rhonchi HEART:  Regular rate and rhythm. No murmur appreciated. ABDOMEN:  Soft, nontender. No hepatomegaly or masses. Positive bowel sounds.  MSK:  No focal spinal tenderness to palpation. Good range of motion in the upper extremities. EXTREMITIES: No peripheral edema. No lymphedema noted today in the right upper extremity. SKIN:  No visible rashes. No ecchymoses or petechiae. No pallor. Good skin turgor. Skin is warm and dry. NEURO:  Nonfocal. Well oriented.  Positive affect.    LAB RESULTS: Lab Results  Component Value Date   WBC 6.9 05/28/2014   NEUTROABS 3.7 05/28/2014   HGB 13.4 05/28/2014   HCT 40.6 05/28/2014   MCV 87.4 05/28/2014   PLT 203 05/28/2014      Chemistry      Component Value Date/Time   NA 142 05/28/2014 0922   NA 143 02/10/2013 0522   K 4.1 05/28/2014 0922   K 3.3* 02/10/2013 0522   CL 99 02/20/2013 0758   CL 104 02/10/2013 0522   CO2 28 05/28/2014 0922   CO2 30 02/10/2013 0522   BUN  21.3 05/28/2014 0922    BUN 14 02/10/2013 0522   CREATININE 1.0 05/28/2014 0922   CREATININE 0.92 02/10/2013 0522      Component Value Date/Time   CALCIUM 10.3 05/28/2014 0922   CALCIUM 9.1 02/10/2013 0522   ALKPHOS 56 05/28/2014 0922   ALKPHOS 58 02/10/2013 0522   AST 40* 05/28/2014 0922   AST 39* 02/10/2013 0522   ALT 30 05/28/2014 0922   ALT 36* 02/10/2013 0522   BILITOT 0.68 05/28/2014 0922   BILITOT 0.7 02/10/2013 0522      STUDIES:  Most recent bone density on file was from February 2005 at [facility], showing osteopenia with a T score of -1.75.    [facility] was unremarkable.   ASSESSMENT: 68 y.o.  [place] woman  (1) status post right modified radical mastectomy 04/26/2012 for a pT2 pN1, stage IIB invasive ductal carcinoma, grade 2, estrogen and progesterone both 100% positive, with an MIB-1 of 39%, and no HER-2 amplification.   (2) status post left lumpectomy 04/26/2012, benign  (3) adjuvant cyclophosphamide/ docetaxel given x1 (06/28/2012), the patient refusing further chemotherapy.  Declined radiation therapy.  (4) anastrozole started 07/21/2012, the plan being to continue for total of 5 years (until August 2018)  (5)  history of hypokalemia, on oral potassium supplementation with normalization  PLAN:  Akeya appears to be doing quite well with regards to her breast cancer, and is continuing to tolerate the anastrozole well. I am making no change in her current regimen, and I have refilled her anastrozole for another year. I plan is to continue for total of 5 years, until August of 2018.   Makailyn is due for her left diagnostic mammogram in November, and we will add a bone density to be obtained at the same time. She will see Dr. Jana Hakim in December for a routine six-month followup to review those results.   I am also going to have Ellamarie stop by in approximately 8-10 weeks to repeat her metabolic panel, primarily to follow the slight elevation in her  AST. In  general, there has been no trend when looking over the last couple of years, but there has been a slight trend upward over the past 12-14 months.  Lily will continue taking her potassium, 20 mEq twice daily which she is tolerating well, and which is keeping her potassium in the mid range of normal. This will be repeated at her next lab appointment as well.  The above was reviewed in detail with [name] today. She and Yvonne [name] both voice their understanding and agreement with this plan, and then to call with any changes or problems prior to her next scheduled appointment.    Kaedin Hicklin PA-C     06/06/2014

## 2014-06-07 ENCOUNTER — Telehealth: Payer: Self-pay | Admitting: Physician Assistant

## 2014-06-07 NOTE — Telephone Encounter (Signed)
, °

## 2014-08-14 ENCOUNTER — Other Ambulatory Visit: Payer: Self-pay | Admitting: *Deleted

## 2014-08-14 DIAGNOSIS — C50419 Malignant neoplasm of upper-outer quadrant of unspecified female breast: Secondary | ICD-10-CM

## 2014-08-15 ENCOUNTER — Other Ambulatory Visit (HOSPITAL_BASED_OUTPATIENT_CLINIC_OR_DEPARTMENT_OTHER): Payer: Medicare HMO

## 2014-08-15 DIAGNOSIS — R748 Abnormal levels of other serum enzymes: Secondary | ICD-10-CM

## 2014-08-15 DIAGNOSIS — C50419 Malignant neoplasm of upper-outer quadrant of unspecified female breast: Secondary | ICD-10-CM

## 2014-08-15 DIAGNOSIS — C773 Secondary and unspecified malignant neoplasm of axilla and upper limb lymph nodes: Secondary | ICD-10-CM

## 2014-08-15 DIAGNOSIS — Z853 Personal history of malignant neoplasm of breast: Secondary | ICD-10-CM

## 2014-08-15 LAB — CBC WITH DIFFERENTIAL/PLATELET
BASO%: 1.2 % (ref 0.0–2.0)
Basophils Absolute: 0.1 10*3/uL (ref 0.0–0.1)
EOS%: 4.2 % (ref 0.0–7.0)
Eosinophils Absolute: 0.3 10*3/uL (ref 0.0–0.5)
HCT: 38.1 % (ref 34.8–46.6)
HGB: 12.4 g/dL (ref 11.6–15.9)
LYMPH%: 34.4 % (ref 14.0–49.7)
MCH: 28.1 pg (ref 25.1–34.0)
MCHC: 32.6 g/dL (ref 31.5–36.0)
MCV: 86.3 fL (ref 79.5–101.0)
MONO#: 0.5 10*3/uL (ref 0.1–0.9)
MONO%: 7.7 % (ref 0.0–14.0)
NEUT%: 52.5 % (ref 38.4–76.8)
NEUTROS ABS: 3.7 10*3/uL (ref 1.5–6.5)
Platelets: 210 10*3/uL (ref 145–400)
RBC: 4.41 10*6/uL (ref 3.70–5.45)
RDW: 14.3 % (ref 11.2–14.5)
WBC: 7 10*3/uL (ref 3.9–10.3)
lymph#: 2.4 10*3/uL (ref 0.9–3.3)

## 2014-08-15 LAB — COMPREHENSIVE METABOLIC PANEL (CC13)
ALT: 27 U/L (ref 0–55)
AST: 31 U/L (ref 5–34)
Albumin: 3.9 g/dL (ref 3.5–5.0)
Alkaline Phosphatase: 63 U/L (ref 40–150)
Anion Gap: 11 mEq/L (ref 3–11)
BILIRUBIN TOTAL: 0.81 mg/dL (ref 0.20–1.20)
BUN: 18.7 mg/dL (ref 7.0–26.0)
CO2: 27 mEq/L (ref 22–29)
CREATININE: 0.9 mg/dL (ref 0.6–1.1)
Calcium: 10.2 mg/dL (ref 8.4–10.4)
Chloride: 105 mEq/L (ref 98–109)
Glucose: 115 mg/dl (ref 70–140)
Potassium: 3.9 mEq/L (ref 3.5–5.1)
SODIUM: 142 meq/L (ref 136–145)
TOTAL PROTEIN: 7.3 g/dL (ref 6.4–8.3)

## 2014-11-09 ENCOUNTER — Ambulatory Visit
Admission: RE | Admit: 2014-11-09 | Discharge: 2014-11-09 | Disposition: A | Discharge status: Home / Self Care | Payer: Medicare HMO | Source: Ambulatory Visit | Attending: Oncology | Admitting: Oncology

## 2014-11-09 DIAGNOSIS — Z853 Personal history of malignant neoplasm of breast: Secondary | ICD-10-CM

## 2014-11-26 ENCOUNTER — Telehealth: Payer: Self-pay | Admitting: Oncology

## 2014-11-26 NOTE — Telephone Encounter (Signed)
pt left vm wanting appt times-cld & spoke to pt and adv pt of time & date-*pt understood

## 2014-11-28 ENCOUNTER — Other Ambulatory Visit (HOSPITAL_BASED_OUTPATIENT_CLINIC_OR_DEPARTMENT_OTHER): Payer: Medicare HMO

## 2014-11-28 DIAGNOSIS — R748 Abnormal levels of other serum enzymes: Secondary | ICD-10-CM

## 2014-11-28 DIAGNOSIS — Z853 Personal history of malignant neoplasm of breast: Secondary | ICD-10-CM

## 2014-11-28 LAB — CBC WITH DIFFERENTIAL/PLATELET
BASO%: 0.6 % (ref 0.0–2.0)
Basophils Absolute: 0 10*3/uL (ref 0.0–0.1)
EOS%: 3.4 % (ref 0.0–7.0)
Eosinophils Absolute: 0.2 10*3/uL (ref 0.0–0.5)
HCT: 39.4 % (ref 34.8–46.6)
HGB: 12.9 g/dL (ref 11.6–15.9)
LYMPH#: 2.6 10*3/uL (ref 0.9–3.3)
LYMPH%: 37.1 % (ref 14.0–49.7)
MCH: 28.5 pg (ref 25.1–34.0)
MCHC: 32.7 g/dL (ref 31.5–36.0)
MCV: 87 fL (ref 79.5–101.0)
MONO#: 0.6 10*3/uL (ref 0.1–0.9)
MONO%: 8.2 % (ref 0.0–14.0)
NEUT#: 3.6 10*3/uL (ref 1.5–6.5)
NEUT%: 50.7 % (ref 38.4–76.8)
Platelets: 210 10*3/uL (ref 145–400)
RBC: 4.53 10*6/uL (ref 3.70–5.45)
RDW: 14.3 % (ref 11.2–14.5)
WBC: 7.1 10*3/uL (ref 3.9–10.3)

## 2014-11-28 LAB — COMPREHENSIVE METABOLIC PANEL (CC13)
ALBUMIN: 3.8 g/dL (ref 3.5–5.0)
ALT: 29 U/L (ref 0–55)
ANION GAP: 12 meq/L — AB (ref 3–11)
AST: 34 U/L (ref 5–34)
Alkaline Phosphatase: 70 U/L (ref 40–150)
BILIRUBIN TOTAL: 0.55 mg/dL (ref 0.20–1.20)
BUN: 17.6 mg/dL (ref 7.0–26.0)
CO2: 25 meq/L (ref 22–29)
Calcium: 10.2 mg/dL (ref 8.4–10.4)
Chloride: 101 mEq/L (ref 98–109)
Creatinine: 0.9 mg/dL (ref 0.6–1.1)
EGFR: 63 mL/min/{1.73_m2} — AB (ref >90)
GLUCOSE: 125 mg/dL (ref 70–140)
POTASSIUM: 4 meq/L (ref 3.5–5.1)
SODIUM: 138 meq/L (ref 136–145)
Total Protein: 7.2 g/dL (ref 6.4–8.3)

## 2014-12-05 ENCOUNTER — Ambulatory Visit (HOSPITAL_BASED_OUTPATIENT_CLINIC_OR_DEPARTMENT_OTHER): Payer: Medicare HMO | Admitting: Oncology

## 2014-12-05 ENCOUNTER — Telehealth: Payer: Self-pay | Admitting: Oncology

## 2014-12-05 VITALS — BP 168/81 | HR 92 | Temp 98.0°F | Resp 18 | Ht 66.0 in | Wt 210.4 lb

## 2014-12-05 DIAGNOSIS — Z170 Estrogen receptor positive status [ER+]: Secondary | ICD-10-CM

## 2014-12-05 DIAGNOSIS — Z780 Asymptomatic menopausal state: Secondary | ICD-10-CM

## 2014-12-05 DIAGNOSIS — C50411 Malignant neoplasm of upper-outer quadrant of right female breast: Secondary | ICD-10-CM

## 2014-12-05 DIAGNOSIS — M8580 Other specified disorders of bone density and structure, unspecified site: Secondary | ICD-10-CM

## 2014-12-05 MED ORDER — ANASTROZOLE 1 MG PO TABS: 1.0000 mg | ORAL_TABLET | Freq: Every day | ORAL | Status: DC

## 2014-12-05 NOTE — Telephone Encounter (Signed)
, °

## 2014-12-05 NOTE — Progress Notes (Signed)
ID: Yvonne [name]   DOB: [birth year]  MR#: [id number]  EZM#:[id number]  PCP: Yvonne [name] GYN: SU: Yvonne Hook, MD OTHER MD:  CHIEF COMPLAINT: Estrogen receptor positive breast cancer  CURRENT TREATMENT: Anastrozole   HISTORY OF PRESENT ILLNESS: From the original intake note:  The patient underwent screening mammography 03/02/2012. (Prior mammogram was 2005). Possible mass was noted in the right breast and she was recalled for additional views 03/25/2012. These showed a dense spiculated mass with overlying skin retraction in the upper outer quadrant of the right breast. There were associated coarse linear calcifications. On physical exam there was an area of dimpling in the upper outer quadrant of the right breast and a 5 cm mass was palpable. By ultrasound, this was irregular, hypoechoic, and measured 3.3 cm. In addition, an abnormal lymph node in the right axilla with the thickened cortex measured 1.8 cm.  In the opposite breast, Dr. Valinda [name] found an irregular hypoechoic mass with lobulated margins measuring 2.3 cm. The left axilla was unremarkable. Bilateral breast MRI was obtained 04/01/2012, and showed the mass in question in the right breast to measure 3.8 cm maximally, with overlying skin retraction. In addition, there was a second enhancing bilobed mass in the lower outer quadrant of the right breast, measuring 1 point centimeter. This was felt to possibly represent an intramammary lymph node.  Biopsy of the right breast and right axillary lymph node 03/25/2012 both showed (Yvonne [name])  invasive ductal carcinoma, grade 2, estrogen and progesterone receptor positive, both at 100%, with an MIB-1 39%, and no HER-2 amplification. 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 After much discussion the patient decided on right modified radical mastectomy and left lumpectomy.   Her subsequent  history is as detailed below.  INTERVAL HISTORY: Yvonne [name] returns today for followup of her right breast carcinoma accompanied by her husband Yvonne [name]. She continues on anastrozole without significant hot flashes or vaginal dryness issues. She denies arthralgias or myalgias. She is now working pretty much full-time.  REVIEW OF SYSTEMS: A detailed review of systems today was noncontributory  PAST MEDICAL HISTORY: Past Medical History  Diagnosis Date  . Elevated cholesterol     takes Crestor daily  . Hypertension     takes Maxzide daily   . Phlebitis 36yr ago    hx of-  . Cancer 03/25/12    right breast  . Seasonal allergies     takes Zyrtec daily and Nasal Spray prn  . Headache(784.0)   . Arthritis   . Chronic back pain     arthritis  . Bruises easily   . Hemorrhoids   . Breast cancer 04/2012  . History of chemotherapy     x 1, pt unable to tolerate, on PO chemotherpay    PAST SURGICAL HISTORY: Past Surgical History  Procedure Laterality Date  . Appendectomy  89yrago  . Tubal ligation  3855yrgo  . Colonoscopy    . Portacath placement  04/26/2012    Procedure: INSERTION PORT-A-CATH;  Surgeon: Yvonne [name];  [facility]  Laterality: Left;  Started at 1746.  . Breast surgery  04/27/12    Right mod mastectomy, ER/PR +, HER2 -  . Breast surgery  04/27/12    Left Breast NL lumpectomy-no malignancy  . Port-a-cath removal  11/24/2012    Procedure: REMOVAL PORT-A-CATH;  Surgeon: Yvonne [name];  [facility];  Laterality: Left;    FAMILY HISTORY Family History  Problem Relation Age of Onset  . Cancer Mother     ovarian cancer  . Cancer Sister 33    colon cancer  . Cancer Brother 33    colon cancer  . Cancer Brother     prostate cancer, deceased  . Anesthesia problems Neg Hx   . Hypotension Neg Hx   . Malignant hyperthermia Neg Hx   . Pseudochol deficiency Neg Hx   . Leukemia Father   . Cancer Father      leukemia  the patient's father died at the age of 26, from "leukemia." the patient's mother died at the age of 20, for what may have been uterine or ovarian cancer. The patient had 4 brothers and 2 sisters one sister has a history of colon cancer diagnosed at age 58 one brother has a history of colon cancer diagnosed at age 61 a second brother died from complications of prostate cancer. There is no other breast cancer in the family to her knowledge.  GYNECOLOGIC HISTORY:  (Reviewed 06/06/2014) Menarche age 33, she is GX P5, first pregnancy to term age 51, last period approximately age 38. 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 She is married to [facility], who is disabled secondary to COPD. [name] had 5 children from a prior marriage. They are Yvonne [name], 39, who was also present at the [facility] visit, and works for [employer]. Yvonne [name], 42, who is employed in Architect, Yvonne [name] 46 who works as a Secretary/administrator, and Production manager 76 who works as a Quarry manager for [employer]. The fifth child died at age 60. The patient has 13 grandchildren and 9 great grandchildren. She is not a Ambulance person.   ADVANCED DIRECTIVES: not in place  HEALTH MAINTENANCE: (Updated 06/06/2014) History  Substance Use Topics  . Smoking status: Former Smoker    Quit date: 01/10/1984  . Smokeless tobacco: Never Used     Comment: quit 30+yrs ago  . Alcohol Use: No     Colonoscopy: 2007/ repeat is due  PAP: March 2012  Bone density:2005, osteopenia  Lipid panel:  Not on file   Allergies  Allergen Reactions  . Penicillins Anaphylaxis    Current Outpatient Prescriptions  Medication Sig Dispense Refill  . anastrozole (ARIMIDEX) 1 MG tablet Take 1 tablet (1 mg total) by mouth daily. 90 tablet 3  . aspirin 81 MG tablet Take 81 mg by mouth  daily.    Marland Kitchen BAYER CONTOUR NEXT TEST test strip     . Calcium Carbonate-Vitamin D (CALCIUM + D PO) Take 1 tablet by mouth daily.    . celecoxib (CELEBREX) 200 MG capsule Take 200 mg by mouth daily.    . cetirizine (ZYRTEC) 10 MG chewable tablet Chew 10 mg by mouth daily.    . fluticasone (FLONASE) 50 MCG/ACT nasal spray     . fluticasone (VERAMYST) 27.5 MCG/SPRAY nasal spray Place 2 sprays into the nose daily.    Marland Kitchen ibuprofen (ADVIL,MOTRIN) 800 MG tablet     . metFORMIN (GLUCOPHAGE) 500 MG tablet     . Multiple Vitamins-Minerals (CERTAVITE SENIOR/ANTIOXIDANT) TABS Take 1 tablet by mouth.    . potassium chloride SA (K-DUR,KLOR-CON) 20 MEQ tablet Take 1 tablet (20 mEq total) by mouth 2 (two) times daily. 180 tablet 3  . rosuvastatin (CRESTOR)  20 MG tablet Take 20 mg by mouth daily.    Marland Kitchen triamterene-hydrochlorothiazide (MAXZIDE-25) 37.5-25 MG per tablet Take 1 tablet by mouth daily.    Marland Kitchen UNABLE TO FIND Please excuse Ms. Napierala from any work obligations from 02/09/2013 till 02/12/2013. Patient can return to work on 02/13/2013 without any restrictions. 1 Units 0  . ZETIA 10 MG tablet Pt takes 1/2 tablet in am and 1/2 tablet in pm     No current facility-administered medications for this visit.    OBJECTIVE: middle-aged white woman in no acute distress Filed Vitals:   12/05/14 1154  BP: 168/81  Pulse: 92  Temp: 98 F (36.7 C)  Resp: 18     Body mass index is 33.98 kg/(m^2).    ECOG FS: 0 Filed Weights   12/05/14 1154  Weight: 210 lb 6.4 oz (95.437 kg)   Sclerae unicteric, pupils equal and reactive Oropharynx clear, teeth in poor repair No cervical or supraclavicular adenopathy Lungs no rales or rhonchi Heart regular rate and rhythm Abd soft, obese,nontender, positive bowel sounds MSK no focal spinal tenderness, no upper extremity lymphedema Neuro: nonfocal, well oriented, appropriate affect Breasts: the right breast is status post mastectomy. There is no evidence of chest wall  recurrence. The right axilla is benign. The left breast is unremarkable.   LAB RESULTS: Lab Results  Component Value Date   WBC 7.1 11/28/2014   NEUTROABS 3.6 11/28/2014   HGB 12.9 11/28/2014   HCT 39.4 11/28/2014   MCV 87.0 11/28/2014   PLT 210 11/28/2014      Chemistry      Component Value Date/Time   NA 138 11/28/2014 1026   NA 143 02/10/2013 0522   K 4.0 11/28/2014 1026   K 3.3* 02/10/2013 0522   CL 99 02/20/2013 0758   CL 104 02/10/2013 0522   CO2 25 11/28/2014 1026   CO2 30 02/10/2013 0522   BUN 17.6 11/28/2014 1026   BUN 14 02/10/2013 0522   CREATININE 0.9 11/28/2014 1026   CREATININE 0.92 02/10/2013 0522      Component Value Date/Time   CALCIUM 10.2 11/28/2014 1026   CALCIUM 9.1 02/10/2013 0522   ALKPHOS 70 11/28/2014 1026   ALKPHOS 58 02/10/2013 0522   AST 34 11/28/2014 1026   AST 39* 02/10/2013 0522   ALT 29 11/28/2014 1026   ALT 36* 02/10/2013 0522   BILITOT 0.55 11/28/2014 1026   BILITOT 0.7 02/10/2013 0522      STUDIES:   CLINICAL DATA: Screening.  EXAM: DIGITAL SCREENING UNILATERAL LEFT MAMMOGRAM WITH CAD  COMPARISON: Previous exam(s).  ACR Breast Density Category b: There are scattered areas of fibroglandular density.  FINDINGS: There are no findings suspicious for malignancy. Images were processed with CAD.  IMPRESSION: No mammographic evidence of malignancy. A result letter of this screening mammogram will be mailed directly to the patient.  RECOMMENDATION: Screening mammogram in one year. (Code:SM-B-01Y)  BI-RADS CATEGORY 1: Negative.   Electronically Signed  By: Lillia Mountain M.D.  On: 11/09/2014 14:55     ASSESSMENT: 68 y.o.  [place] woman  (1) status post right modified radical mastectomy 04/26/2012 for a pT2 pN1, stage IIB invasive ductal carcinoma, grade 2, estrogen and progesterone both 100% positive, with an MIB-1 of 39%, and no HER-2 amplification.   (2) status post left lumpectomy 04/26/2012,  benign  (3) adjuvant cyclophosphamide/ docetaxel given x1 (06/28/2012), the patient refusing further chemotherapy.  Declined radiation therapy.  (4) anastrozole started 07/21/2012, the plan being to continue for total  of 5 years (until August 2018)  (a) bone density at [facility] 02/13/2004 at showed a T score of -1.75 (osteopenia)  (b) on calcium and vitamin D supplementation.   PLAN:  Soriah is now 2-1/2 years out from her definitive breast cancer surgery, with no evidence of disease recurrence. She is tolerating the anastrozole generally well. The plan is, be to continue anastrozole for a total of 5 years. She ended up not getting her bone density retested and her teeth are not in good enough repair that I feel comfortable going on a bisphosphonate. She is reluctant to change to tamoxifen, because she is doing so well on the anastrozole clinically.  Accordingly the plan remains to continue anastrozole through August 2018. She will see [place] again in 6 months. She will have lab work and physical exam at that time. She knows to call for any problems that may develop before that visit.  Chauncey Cruel, MD      12/07/2014

## 2014-12-10 NOTE — Addendum Note (Signed)
Addended by: Laureen Abrahams on: 12/10/2014 05:19 PM   Modules accepted: Medications

## 2015-04-09 ENCOUNTER — Telehealth: Payer: Self-pay | Admitting: Oncology

## 2015-04-09 NOTE — Telephone Encounter (Signed)
pt cld left vm wanting to know appt time & date-gave pt appt time & date

## 2015-05-03 ENCOUNTER — Other Ambulatory Visit: Payer: Self-pay | Admitting: *Deleted

## 2015-05-03 DIAGNOSIS — C50411 Malignant neoplasm of upper-outer quadrant of right female breast: Secondary | ICD-10-CM

## 2015-05-06 ENCOUNTER — Other Ambulatory Visit (HOSPITAL_BASED_OUTPATIENT_CLINIC_OR_DEPARTMENT_OTHER): Payer: Medicare HMO

## 2015-05-06 DIAGNOSIS — C50411 Malignant neoplasm of upper-outer quadrant of right female breast: Secondary | ICD-10-CM | POA: Diagnosis not present

## 2015-05-06 LAB — COMPREHENSIVE METABOLIC PANEL (CC13)
ALT: 27 U/L (ref 0–55)
ANION GAP: 13 meq/L — AB (ref 3–11)
AST: 30 U/L (ref 5–34)
Albumin: 3.8 g/dL (ref 3.5–5.0)
Alkaline Phosphatase: 63 U/L (ref 40–150)
BUN: 17.5 mg/dL (ref 7.0–26.0)
CALCIUM: 9.7 mg/dL (ref 8.4–10.4)
CHLORIDE: 101 meq/L (ref 98–109)
CO2: 27 meq/L (ref 22–29)
Creatinine: 1 mg/dL (ref 0.6–1.1)
EGFR: 61 mL/min/{1.73_m2} — ABNORMAL LOW (ref >90)
Glucose: 107 mg/dl (ref 70–140)
Potassium: 3.7 mEq/L (ref 3.5–5.1)
SODIUM: 141 meq/L (ref 136–145)
TOTAL PROTEIN: 6.9 g/dL (ref 6.4–8.3)
Total Bilirubin: 0.68 mg/dL (ref 0.20–1.20)

## 2015-05-06 LAB — CBC WITH DIFFERENTIAL/PLATELET
BASO%: 0.9 % (ref 0.0–2.0)
Basophils Absolute: 0.1 10e3/uL (ref 0.0–0.1)
EOS%: 3.6 % (ref 0.0–7.0)
Eosinophils Absolute: 0.3 10e3/uL (ref 0.0–0.5)
HCT: 40.1 % (ref 34.8–46.6)
HGB: 13.2 g/dL (ref 11.6–15.9)
LYMPH%: 41.2 % (ref 14.0–49.7)
MCH: 28.5 pg (ref 25.1–34.0)
MCHC: 32.9 g/dL (ref 31.5–36.0)
MCV: 86.6 fL (ref 79.5–101.0)
MONO#: 0.6 10e3/uL (ref 0.1–0.9)
MONO%: 8.9 % (ref 0.0–14.0)
NEUT#: 3.2 10e3/uL (ref 1.5–6.5)
NEUT%: 45.4 % (ref 38.4–76.8)
Platelets: 189 10e3/uL (ref 145–400)
RBC: 4.63 10e6/uL (ref 3.70–5.45)
RDW: 14.5 % (ref 11.2–14.5)
WBC: 7 10e3/uL (ref 3.9–10.3)
lymph#: 2.9 10e3/uL (ref 0.9–3.3)

## 2015-05-09 ENCOUNTER — Encounter: Payer: Self-pay | Admitting: Nurse Practitioner

## 2015-05-09 ENCOUNTER — Ambulatory Visit (HOSPITAL_BASED_OUTPATIENT_CLINIC_OR_DEPARTMENT_OTHER): Payer: Medicare HMO | Admitting: Nurse Practitioner

## 2015-05-09 VITALS — BP 132/78 | HR 84 | Temp 97.8°F | Resp 18 | Ht 66.0 in | Wt 206.5 lb

## 2015-05-09 DIAGNOSIS — M8580 Other specified disorders of bone density and structure, unspecified site: Secondary | ICD-10-CM | POA: Diagnosis not present

## 2015-05-09 DIAGNOSIS — C50411 Malignant neoplasm of upper-outer quadrant of right female breast: Secondary | ICD-10-CM

## 2015-05-09 DIAGNOSIS — Z79811 Long term (current) use of aromatase inhibitors: Secondary | ICD-10-CM

## 2015-05-09 DIAGNOSIS — Z170 Estrogen receptor positive status [ER+]: Secondary | ICD-10-CM | POA: Diagnosis not present

## 2015-05-09 NOTE — Addendum Note (Signed)
Addended by: Marcelino Duster on: 05/09/2015 03:23 PM   Modules accepted: Orders

## 2015-05-09 NOTE — Progress Notes (Signed)
ID: Yvonne [name]   DOB: March 02, 1946  MR#: [id number]  OTR#:[id number]  PCP: Yvonne [name] GYN: SU: Yvonne Hook, MD OTHER MD:  CHIEF COMPLAINT: Estrogen receptor positive breast cancer  CURRENT TREATMENT: Anastrozole   HISTORY OF PRESENT ILLNESS: From the original intake note:  The patient underwent screening mammography 03/02/2012. (Prior mammogram was 2005). Possible mass was noted in the right breast and she was recalled for additional views 03/25/2012. These showed a dense spiculated mass with overlying skin retraction in the upper outer quadrant of the right breast. There were associated coarse linear calcifications. On physical exam there was an area of dimpling in the upper outer quadrant of the right breast and a 5 cm mass was palpable. By ultrasound, this was irregular, hypoechoic, and measured 3.3 cm. In addition, an abnormal lymph node in the right axilla with the thickened cortex measured 1.8 cm.  In the opposite breast, Yvonne [name] found an irregular hypoechoic mass with lobulated margins measuring 2.3 cm. The left axilla was unremarkable. Bilateral breast MRI was obtained 04/01/2012, and showed the mass in question in the right breast to measure 3.8 cm maximally, with overlying skin retraction. In addition, there was a second enhancing bilobed mass in the lower outer quadrant of the right breast, measuring 1 point centimeter. This was felt to possibly represent an intramammary lymph node.  Biopsy of the right breast and right axillary lymph node 03/25/2012 both showed ([id number])  invasive ductal carcinoma, grade 2, estrogen and progesterone receptor positive, both at 100%, with an MIB-1 39%, and no HER-2 amplification. 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 After much discussion the patient decided on right modified radical mastectomy and left lumpectomy.   Her subsequent  history is as detailed below.  INTERVAL HISTORY: Yvonne [name] returns today for follow up of her right breast carcinoma, accompanied by her husband Yvonne [name]. She has been on anastrozole since August 2013 and is tolerating this drug well. She has mild hot flashes and vaginal dryness, but denies increased arthralgias/myalgias. The interval history is completely unremarkable. 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 She does not exercise regularly but walks a lot on the job.  REVIEW OF SYSTEMS: Besides chronic arthritis to the knee and hearing loss a detailed review of systems is otherwise entirely negative.  PAST MEDICAL HISTORY: Past Medical History  Diagnosis Date  . Elevated cholesterol     takes Crestor daily  . Hypertension     takes Maxzide daily   . Phlebitis 86yr ago    hx of-  . Cancer 03/25/12    right breast  . Seasonal allergies     takes Zyrtec daily and Nasal Spray prn  . Headache(784.0)   . Arthritis   . Chronic back pain     arthritis  . Bruises easily   . Hemorrhoids   . Breast cancer 04/2012  . History of chemotherapy     x 1, pt unable to tolerate, on PO chemotherpay    PAST SURGICAL HISTORY: Past Surgical History  Procedure Laterality Date  . Appendectomy  182yrago  . Tubal ligation  3723yrgo  . Colonoscopy    . Portacath placement  04/26/2012    Procedure: INSERTION PORT-A-CATH;  Surgeon: Yvonne [name];  [facility];  Laterality: Left;  Started at 1746.  . Breast surgery  04/27/12    Right mod mastectomy, ER/PR +,  HER2 -  . Breast surgery  04/27/12    Left Breast NL lumpectomy-no malignancy  . Port-a-cath removal  11/24/2012    Procedure: REMOVAL PORT-A-CATH;  Surgeon: Yvonne Hook, DO;  Location: [facility];  Service: General;  Laterality: Left;    FAMILY HISTORY Family History  Problem Relation Age of Onset  . Cancer Mother     ovarian cancer  . Cancer Sister 30    colon cancer  . Cancer  Brother 3    colon cancer  . Cancer Brother     prostate cancer, deceased  . Anesthesia problems Neg Hx   . Hypotension Neg Hx   . Malignant hyperthermia Neg Hx   . Pseudochol deficiency Neg Hx   . Leukemia Father   . Cancer Father     leukemia  the patient's father died at the age of 28, from "leukemia." the patient's mother died at the age of 32, for what may have been uterine or ovarian cancer. The patient had 4 brothers and 2 sisters one sister has a history of colon cancer diagnosed at age 67 one brother has a history of colon cancer diagnosed at age 23 a second brother died from complications of prostate cancer. There is no other breast cancer in the family to her knowledge.  GYNECOLOGIC HISTORY:  (Reviewed 06/06/2014) Menarche age 25, she is GX P5, first pregnancy to term age 11, last period approximately age 62. 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 She is married to Yvonne [name], who is disabled secondary to COPD. Yvonne [name] had 5 children from a prior marriage. They are Yvonne [name], 38, who was also present at the [facility] visit, and works for [employer]. Yvonne [name], 42, who is employed in Architect, Yvonne [name] 46 who works as a Secretary/administrator, and Yvonne [name] 82 who works as a Quarry [name] for [employer]. The fifth child died at age 47. The patient has 13 grandchildren and 9 great grandchildren. She is not a Ambulance person.   ADVANCED DIRECTIVES: not in place  HEALTH MAINTENANCE: (Updated 06/06/2014) History  Substance Use Topics  . Smoking status: Former Smoker    Quit date: 01/10/1984  . Smokeless tobacco: Never Used     Comment: quit 30+yrs ago  . Alcohol Use: No     Colonoscopy: 2007/ repeat is due  PAP: March 2012  Bone density:2005, osteopenia  Lipid panel:  Not on file   Allergies  Allergen  Reactions  . Penicillins Anaphylaxis    Current Outpatient Prescriptions  Medication Sig Dispense Refill  . anastrozole (ARIMIDEX) 1 MG tablet Take 1 tablet (1 mg total) by mouth daily. 90 tablet 3  . aspirin 81 MG tablet Take 81 mg by mouth daily.    Marland Kitchen BAYER CONTOUR NEXT TEST test strip     . Calcium Carbonate-Vitamin D (CALCIUM + D PO) Take 1 tablet by mouth daily.    . celecoxib (CELEBREX) 200 MG capsule Take 200 mg by mouth daily.    . cetirizine (ZYRTEC) 10 MG chewable tablet Chew 10 mg by mouth daily.    . fluticasone (FLONASE) 50 MCG/ACT nasal spray     . ibuprofen (ADVIL,MOTRIN) 800 MG tablet     . metFORMIN (GLUCOPHAGE) 500 MG tablet     . Multiple Vitamins-Minerals (CERTAVITE SENIOR/ANTIOXIDANT) TABS Take 1 tablet by mouth.    Marland Kitchen  potassium chloride SA (K-DUR,KLOR-CON) 20 MEQ tablet Take 1 tablet (20 mEq total) by mouth 2 (two) times daily. 180 tablet 3  . rosuvastatin (CRESTOR) 20 MG tablet Take 20 mg by mouth daily.    Marland Kitchen triamterene-hydrochlorothiazide (MAXZIDE-25) 37.5-25 MG per tablet Take 1 tablet by mouth daily.    Marland Kitchen ZETIA 10 MG tablet Pt takes 1/2 tablet in am and 1/2 tablet in pm    . UNABLE TO FIND Please excuse Ms. Schlereth from any work obligations from 02/09/2013 till 02/12/2013. Patient can return to work on 02/13/2013 without any restrictions. (Patient not taking: Reported on 05/09/2015) 1 Units 0   No current facility-administered medications for this visit.    OBJECTIVE: middle-aged white woman in no acute distress Filed Vitals:   05/09/15 0950  BP: 132/78  Pulse: 84  Temp: 97.8 F (36.6 C)  Resp: 18     Body mass index is 33.35 kg/(m^2).    ECOG FS: 0 Filed Weights   05/09/15 0950  Weight: 206 lb 8 oz (93.668 kg)   Skin: warm, dry  HEENT: sclerae anicteric, conjunctivae pink, oropharynx clear. No thrush or mucositis. Poor dental condition Lymph Nodes: No cervical or supraclavicular lymphadenopathy  Lungs: clear to auscultation bilaterally, no rales,  wheezes, or rhonci  Heart: regular rate and rhythm  Abdomen: obese, soft, non tender, positive bowel sounds  Musculoskeletal: No focal spinal tenderness, no peripheral edema  Neuro: non focal, well oriented, positive affect  Breasts: right breast status post mastectomy. No evidence of recurrent disease. Right axilla benign, left breast unremarkable   LAB RESULTS: Lab Results  Component Value Date   WBC 7.0 05/06/2015   NEUTROABS 3.2 05/06/2015   HGB 13.2 05/06/2015   HCT 40.1 05/06/2015   MCV 86.6 05/06/2015   PLT 189 05/06/2015      Chemistry      Component Value Date/Time   NA 141 05/06/2015 0846   NA 143 02/10/2013 0522   K 3.7 05/06/2015 0846   K 3.3* 02/10/2013 0522   CL 99 02/20/2013 0758   CL 104 02/10/2013 0522   CO2 27 05/06/2015 0846   CO2 30 02/10/2013 0522   BUN 17.5 05/06/2015 0846   BUN 14 02/10/2013 0522   CREATININE 1.0 05/06/2015 0846   CREATININE 0.92 02/10/2013 0522      Component Value Date/Time   CALCIUM 9.7 05/06/2015 0846   CALCIUM 9.1 02/10/2013 0522   ALKPHOS 63 05/06/2015 0846   ALKPHOS 58 02/10/2013 0522   AST 30 05/06/2015 0846   AST 39* 02/10/2013 0522   ALT 27 05/06/2015 0846   ALT 36* 02/10/2013 0522   BILITOT 0.68 05/06/2015 0846   BILITOT 0.7 02/10/2013 0522      STUDIES: EXAM: DIGITAL SCREENING UNILATERAL LEFT MAMMOGRAM WITH CAD  COMPARISON: Previous exam(s).  ACR Breast Density Category b: There are scattered areas of fibroglandular density.  FINDINGS: There are no findings suspicious for malignancy. Images were processed with CAD.  IMPRESSION: No mammographic evidence of malignancy. A result letter of this screening mammogram will be mailed directly to the patient.  RECOMMENDATION: Screening mammogram in one year. (Code:SM-B-01Y)  BI-RADS CATEGORY 1: Negative.   Electronically Signed  By: Lillia Mountain M.D.  On: 11/09/2014 14:55  ASSESSMENT: 69 y.o.  [place] woman  (1) status post right  modified radical mastectomy 04/26/2012 for a pT2 pN1, stage IIB invasive ductal carcinoma, grade 2, estrogen and progesterone both 100% positive, with an MIB-1 of 39%, and no HER-2 amplification.   (2)  status post left lumpectomy 04/26/2012, benign  (3) adjuvant cyclophosphamide/ docetaxel given x1 (06/28/2012), the patient refusing further chemotherapy.  Declined radiation therapy.  (4) anastrozole started 07/21/2012, the plan being to continue for total of 5 years (until August 2018)  (a) bone density at [facility] 02/13/2004 at showed a T score of -1.75 (osteopenia)  (b) on calcium and vitamin D supplementation.   PLAN:  Chantale continues to do well as far as her breast cancer is concerned. She is now 3 years out from her definitive surgery with no evidence of recurrent disease. The labs were reviewed in detail and were entirely stable. She is tolerating the anastrozole well and will continue this drug for 5 years of antiestrogen therapy.   Unfortunately, her teeth are in no better repair than her last visit. So despite her diagnosis of osteopenia, it would be risky to place her on bisphosphonate therapy. She is resistant to switching to tamoxifen because cause she tolerates the anastrozole so well.   Quintana is already scheduled for labs and a follow up visit in 6 months. She understands and agrees with this plan. She knows the goal of treatment in her case is cure. She has been encouraged to call with any issues that might arise before her next visit here.  Laurie Panda, NP    05/09/2015

## 2015-09-06 ENCOUNTER — Telehealth: Payer: Self-pay | Admitting: *Deleted

## 2015-09-06 NOTE — Telephone Encounter (Signed)
This RN contacted [facility] per faxed request for refill on potassium - refill was given per AB/PA 05/2014.  Noted pt is on Maxide per medication list.  Last office visit 05/09/2015.  Per contact with  [place] discussed above with pharmacist. Pt's maxide is prescribed by Dr Altha Harm.  Requested refill for potassium to be sent to above MD.

## 2015-09-11 ENCOUNTER — Other Ambulatory Visit: Payer: Self-pay

## 2015-09-11 DIAGNOSIS — C50411 Malignant neoplasm of upper-outer quadrant of right female breast: Secondary | ICD-10-CM

## 2015-09-11 DIAGNOSIS — E876 Hypokalemia: Secondary | ICD-10-CM

## 2015-09-11 MED ORDER — POTASSIUM CHLORIDE CRYS ER 20 MEQ PO TBCR: 20.0000 meq | EXTENDED_RELEASE_TABLET | Freq: Two times a day (BID) | ORAL | Status: DC

## 2015-10-09 ENCOUNTER — Other Ambulatory Visit: Payer: Self-pay

## 2015-10-09 DIAGNOSIS — Z9011 Acquired absence of right breast and nipple: Secondary | ICD-10-CM

## 2015-10-09 DIAGNOSIS — Z1231 Encounter for screening mammogram for malignant neoplasm of breast: Secondary | ICD-10-CM

## 2015-11-04 ENCOUNTER — Other Ambulatory Visit (HOSPITAL_BASED_OUTPATIENT_CLINIC_OR_DEPARTMENT_OTHER): Payer: Medicare HMO

## 2015-11-04 DIAGNOSIS — C50411 Malignant neoplasm of upper-outer quadrant of right female breast: Secondary | ICD-10-CM

## 2015-11-04 LAB — CBC WITH DIFFERENTIAL/PLATELET
BASO%: 0.8 % (ref 0.0–2.0)
BASOS ABS: 0.1 10*3/uL (ref 0.0–0.1)
EOS%: 4.1 % (ref 0.0–7.0)
Eosinophils Absolute: 0.3 10*3/uL (ref 0.0–0.5)
HCT: 41.3 % (ref 34.8–46.6)
HGB: 13.6 g/dL (ref 11.6–15.9)
LYMPH%: 41.1 % (ref 14.0–49.7)
MCH: 29.1 pg (ref 25.1–34.0)
MCHC: 32.9 g/dL (ref 31.5–36.0)
MCV: 88.2 fL (ref 79.5–101.0)
MONO#: 0.6 10*3/uL (ref 0.1–0.9)
MONO%: 9.3 % (ref 0.0–14.0)
NEUT#: 2.8 10*3/uL (ref 1.5–6.5)
NEUT%: 44.7 % (ref 38.4–76.8)
Platelets: 184 10*3/uL (ref 145–400)
RBC: 4.68 10*6/uL (ref 3.70–5.45)
RDW: 14.9 % — ABNORMAL HIGH (ref 11.2–14.5)
WBC: 6.3 10*3/uL (ref 3.9–10.3)
lymph#: 2.6 10*3/uL (ref 0.9–3.3)

## 2015-11-04 LAB — COMPREHENSIVE METABOLIC PANEL (CC13)
ALT: 31 U/L (ref 0–55)
AST: 29 U/L (ref 5–34)
Albumin: 3.9 g/dL (ref 3.5–5.0)
Alkaline Phosphatase: 68 U/L (ref 40–150)
Anion Gap: 11 mEq/L (ref 3–11)
BUN: 25.7 mg/dL (ref 7.0–26.0)
CHLORIDE: 103 meq/L (ref 98–109)
CO2: 26 meq/L (ref 22–29)
CREATININE: 1 mg/dL (ref 0.6–1.1)
Calcium: 10.1 mg/dL (ref 8.4–10.4)
EGFR: 61 mL/min/{1.73_m2} — ABNORMAL LOW (ref >90)
Glucose: 112 mg/dl (ref 70–140)
POTASSIUM: 4.1 meq/L (ref 3.5–5.1)
SODIUM: 140 meq/L (ref 136–145)
Total Bilirubin: 0.68 mg/dL (ref 0.20–1.20)
Total Protein: 7.2 g/dL (ref 6.4–8.3)

## 2015-11-07 ENCOUNTER — Ambulatory Visit (HOSPITAL_BASED_OUTPATIENT_CLINIC_OR_DEPARTMENT_OTHER): Payer: Medicare HMO | Admitting: Oncology

## 2015-11-07 ENCOUNTER — Telehealth: Payer: Self-pay | Admitting: Oncology

## 2015-11-07 VITALS — BP 157/63 | HR 59 | Temp 98.2°F | Resp 18 | Ht 66.0 in | Wt 202.9 lb

## 2015-11-07 DIAGNOSIS — C773 Secondary and unspecified malignant neoplasm of axilla and upper limb lymph nodes: Secondary | ICD-10-CM

## 2015-11-07 DIAGNOSIS — Z170 Estrogen receptor positive status [ER+]: Secondary | ICD-10-CM | POA: Diagnosis not present

## 2015-11-07 DIAGNOSIS — M8580 Other specified disorders of bone density and structure, unspecified site: Secondary | ICD-10-CM | POA: Diagnosis not present

## 2015-11-07 DIAGNOSIS — C50411 Malignant neoplasm of upper-outer quadrant of right female breast: Secondary | ICD-10-CM

## 2015-11-07 MED ORDER — GABAPENTIN 300 MG PO CAPS
300.0000 mg | ORAL_CAPSULE | Freq: Every day | ORAL | Status: DC
Start: 2015-11-07 — End: 2017-11-16

## 2015-11-07 MED ORDER — ANASTROZOLE 1 MG PO TABS: 1.0000 mg | ORAL_TABLET | Freq: Every day | ORAL | Status: DC

## 2015-11-07 NOTE — Telephone Encounter (Signed)
Appointments made and avs printed for patient °

## 2015-11-07 NOTE — Progress Notes (Signed)
ID: Yvonne [name]   DOB: 04-06-1946  MR#: [id number]  YBW#:[id number]  PCP: MASSENBUR[place],O'LAF, PA-C GYN: SU: Yvonne Hook, MD OTHER MD:  CHIEF COMPLAINT: Estrogen receptor positive breast cancer  CURRENT TREATMENT: Anastrozole   HISTORY OF PRESENT ILLNESS: From the original intake note:  The patient underwent screening mammography 03/02/2012. (Prior mammogram was 2005). Possible mass was noted in the right breast and she was recalled for additional views 03/25/2012. These showed a dense spiculated mass with overlying skin retraction in the upper outer quadrant of the right breast. There were associated coarse linear calcifications. On physical exam there was an area of dimpling in the upper outer quadrant of the right breast and a 5 cm mass was palpable. By ultrasound, this was irregular, hypoechoic, and measured 3.3 cm. In addition, an abnormal lymph node in the right axilla with the thickened cortex measured 1.8 cm.  In the opposite breast, Dr. Valinda [name] found an irregular hypoechoic mass with lobulated margins measuring 2.3 cm. The left axilla was unremarkable. Bilateral breast MRI was obtained 04/01/2012, and showed the mass in question in the right breast to measure 3.8 cm maximally, with overlying skin retraction. In addition, there was a second enhancing bilobed mass in the lower outer quadrant of the right breast, measuring 1 point centimeter. This was felt to possibly represent an intramammary lymph node.  Biopsy of the right breast and right axillary lymph node 03/25/2012 both showed ([id number])  invasive ductal carcinoma, grade 2, estrogen and progesterone receptor positive, both at 100%, with an MIB-1 39%, and no HER-2 amplification. 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 After much discussion the patient decided on right modified radical mastectomy and left lumpectomy.   Her subsequent  history is as detailed below.  INTERVAL HISTORY: Yvonne [name] returns today for follow up of her estrogen receptor positive breast carcinoma, accompanied by her husband Yvonne [name]. She continues on anastrozole. Aside from hot flashes she tolerates that well. Vaginal dryness is not an issue and she never experienced the arthralgias/myalgias that many patients can have on that medication.  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 She does not otherwise exercise regularly. Hot flashes are worse at night than during the day. She is having some dental issues but has not been able to get to a dentist yet. A detailed review of systems today was otherwise stable  PAST MEDICAL HISTORY: Past Medical History  Diagnosis Date  . Elevated cholesterol     takes Crestor daily  . Hypertension     takes Maxzide daily   . Phlebitis 56yr ago    hx of-  . Cancer 03/25/12    right breast  . Seasonal allergies     takes Zyrtec daily and Nasal Spray prn  . Headache(784.0)   . Arthritis   . Chronic back pain     arthritis  . Bruises easily   . Hemorrhoids   . Breast cancer 04/2012  . History of chemotherapy     x 1, pt unable to tolerate, on PO chemotherpay    PAST SURGICAL HISTORY: Past Surgical History  Procedure Laterality Date  . Appendectomy  164yrago  . Tubal ligation  3728yrgo  . Colonoscopy    . Portacath placement  04/26/2012    Procedure: INSERTION PORT-A-CATH;  Surgeon: BriMadilyn HookO;  [facility];  Service: General;  Laterality: Left;  Started at 1746.  . Breast surgery  04/27/12    Right mod mastectomy, ER/PR +, HER2 -  . Breast surgery  04/27/12    Left Breast NL lumpectomy-no malignancy  . Port-a-cath removal  11/24/2012    Procedure: REMOVAL PORT-A-CATH;  Surgeon: Yvonne Hook, DO;  Location: [facility];  Service: General;  Laterality: Left;    FAMILY HISTORY Family  History  Problem Relation Age of Onset  . Cancer Mother     ovarian cancer  . Cancer Sister 4    colon cancer  . Cancer Brother 61    colon cancer  . Cancer Brother     prostate cancer, deceased  . Anesthesia problems Neg Hx   . Hypotension Neg Hx   . Malignant hyperthermia Neg Hx   . Pseudochol deficiency Neg Hx   . Leukemia Father   . Cancer Father     leukemia  the patient's father died at the age of 72, from "leukemia." the patient's mother died at the age of 36, for what may have been uterine or ovarian cancer. The patient had 4 brothers and 2 sisters one sister has a history of colon cancer diagnosed at age 73 one brother has a history of colon cancer diagnosed at age 68 a second brother died from complications of prostate cancer. There is no other breast cancer in the family to her knowledge.  GYNECOLOGIC HISTORY:  (Reviewed 06/06/2014) Menarche age 71, she is GX P5, first pregnancy to term age 50, last period approximately age 65. 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 She is married to Yvonne [name], who is disabled secondary to COPD. Cuca had 5 children from a prior marriage. They are Yvonne [name], 38, who was also present at the [facility] visit, and works for [employer]. Yvonne [name], 42, who is employed in Architect, Yvonne [name] 46 who works as a Secretary/administrator, and Yvonne [name] 9 who works as a Quarry [name] for [employer]. The fifth child died at age 46. The patient has 13 grandchildren and 9 great grandchildren. She is not a Ambulance person.   ADVANCED DIRECTIVES: not in place  HEALTH MAINTENANCE: (Updated 06/06/2014) Social History  Substance Use Topics  . Smoking status: Former Smoker    Quit date: 01/10/1984  . Smokeless tobacco: Never Used     Comment: quit 30+yrs ago  . Alcohol Use: No      Colonoscopy: 2007/ repeat is due  PAP: March 2012  Bone density:2005, osteopenia  Lipid panel:  Not on file   Allergies  Allergen Reactions  . Penicillins Anaphylaxis    Current Outpatient Prescriptions  Medication Sig Dispense Refill  . anastrozole (ARIMIDEX) 1 MG tablet Take 1 tablet (1 mg total) by mouth daily. 90 tablet 3  . aspirin 81 MG tablet Take 81 mg by mouth daily.    Marland Kitchen BAYER CONTOUR NEXT TEST test strip     . Calcium Carbonate-Vitamin D (CALCIUM + D PO) Take 1 tablet by mouth daily.    . celecoxib (CELEBREX) 200 MG capsule Take 200 mg by mouth daily.    . cetirizine (ZYRTEC) 10 MG chewable tablet Chew 10 mg by mouth daily.    . fluticasone (FLONASE) 50 MCG/ACT nasal spray     . gabapentin (NEURONTIN) 300 MG capsule Take 1 capsule (  300 mg total) by mouth at bedtime. 90 capsule 4  . ibuprofen (ADVIL,MOTRIN) 800 MG tablet     . metFORMIN (GLUCOPHAGE) 500 MG tablet     . Multiple Vitamins-Minerals (CERTAVITE SENIOR/ANTIOXIDANT) TABS Take 1 tablet by mouth.    . potassium chloride SA (K-DUR,KLOR-CON) 20 MEQ tablet Take 1 tablet (20 mEq total) by mouth 2 (two) times daily. 180 tablet 3  . rosuvastatin (CRESTOR) 20 MG tablet Take 20 mg by mouth daily.    Marland Kitchen triamterene-hydrochlorothiazide (MAXZIDE-25) 37.5-25 MG per tablet Take 1 tablet by mouth daily.    Marland Kitchen UNABLE TO FIND Please excuse Ms. Worthy from any work obligations from 02/09/2013 till 02/12/2013. Patient can return to work on 02/13/2013 without any restrictions. (Patient not taking: Reported on 05/09/2015) 1 Units 0  . ZETIA 10 MG tablet Pt takes 1/2 tablet in am and 1/2 tablet in pm     No current facility-administered medications for this visit.    OBJECTIVE: middle-aged white woman who appears well Filed Vitals:   11/07/15 0958  BP: 157/63  Pulse: 59  Temp: 98.2 F (36.8 C)  Resp: 18     Body mass index is 32.76 kg/(m^2).    ECOG FS: 0 Filed Weights   11/07/15 0958  Weight: 202 lb 14.4 oz (92.035 kg)    Sclerae unicteric, pupils round and equal Oropharynx clear and moist-- some teeth missing No cervical or supraclavicular adenopathy Lungs no rales or rhonchi Heart regular rate and rhythm Abd soft, nontender, positive bowel sounds MSK no focal spinal tenderness, no upper extremity lymphedema Neuro: nonfocal, well oriented, appropriate affect Breasts: The right breast is status post mastectomy. There is no evidence of chest wall recurrence. The right axilla is benign. The left breast is unremarkable.  LAB RESULTS: Lab Results  Component Value Date   WBC 6.3 11/04/2015   NEUTROABS 2.8 11/04/2015   HGB 13.6 11/04/2015   HCT 41.3 11/04/2015   MCV 88.2 11/04/2015   PLT 184 11/04/2015      Chemistry      Component Value Date/Time   NA 140 11/04/2015 0851   NA 143 02/10/2013 0522   K 4.1 11/04/2015 0851   K 3.3* 02/10/2013 0522   CL 99 02/20/2013 0758   CL 104 02/10/2013 0522   CO2 26 11/04/2015 0851   CO2 30 02/10/2013 0522   BUN 25.7 11/04/2015 0851   BUN 14 02/10/2013 0522   CREATININE 1.0 11/04/2015 0851   CREATININE 0.92 02/10/2013 0522      Component Value Date/Time   CALCIUM 10.1 11/04/2015 0851   CALCIUM 9.1 02/10/2013 0522   ALKPHOS 68 11/04/2015 0851   ALKPHOS 58 02/10/2013 0522   AST 29 11/04/2015 0851   AST 39* 02/10/2013 0522   ALT 31 11/04/2015 0851   ALT 36* 02/10/2013 0522   BILITOT 0.68 11/04/2015 0851   BILITOT 0.7 02/10/2013 0522      STUDIES: No results found.   ASSESSMENT: 69 y.o.  [place] woman  (1) status post right modified radical mastectomy 04/26/2012 for a pT2 pN1, stage IIB invasive ductal carcinoma, grade 2, estrogen and progesterone both 100% positive, with an MIB-1 of 39%, and no HER-2 amplification.   (2) status post left lumpectomy 04/26/2012, benign  (3) adjuvant cyclophosphamide/ docetaxel given x1 (06/28/2012), the patient refusing further chemotherapy.  Declined radiation therapy.  (4) anastrozole started 07/21/2012,  the plan being to continue for total of 5 years (until August 2018)  (a) bone density at [facility]  02/13/2004 at showed a T score of -1.75 (osteopenia)  (b) on calcium and vitamin D supplementation.   PLAN:  Ahriana is now 3-1/2 years out from her definitive surgery for her locally advanced breast cancer, with no evidence of recurrence. She is tolerating anastrozole well and aside from the hot flashes. The plan is to continue that for a minimum of 5 years and then reassess.  She still has not had her teeth taken care of and tells me she thinks she needs a couple pulled. Accordingly we are not starting bisphosphonates or Denosumab at this point. We'll continue the calcium and vitamin D supplementation as before.  I think she would benefit from gabapentin at bedtime and I am placing that prescription. She will let me know if she has any side effects from that.  Otherwise she will see [place] again in 6 months, after her next left mammography. She knows to call for any problems that may develop before that visit.  Chauncey Cruel, MD    11/07/2015

## 2015-11-11 ENCOUNTER — Ambulatory Visit
Admission: RE | Admit: 2015-11-11 | Discharge: 2015-11-11 | Disposition: A | Discharge status: Home / Self Care | Payer: Medicare HMO | Source: Ambulatory Visit

## 2015-11-11 DIAGNOSIS — Z9011 Acquired absence of right breast and nipple: Secondary | ICD-10-CM

## 2015-11-11 DIAGNOSIS — Z1231 Encounter for screening mammogram for malignant neoplasm of breast: Secondary | ICD-10-CM

## 2016-04-28 ENCOUNTER — Other Ambulatory Visit: Payer: Self-pay

## 2016-04-28 DIAGNOSIS — C50411 Malignant neoplasm of upper-outer quadrant of right female breast: Secondary | ICD-10-CM

## 2016-04-29 ENCOUNTER — Other Ambulatory Visit (HOSPITAL_BASED_OUTPATIENT_CLINIC_OR_DEPARTMENT_OTHER): Payer: Medicare HMO

## 2016-04-29 DIAGNOSIS — C50411 Malignant neoplasm of upper-outer quadrant of right female breast: Secondary | ICD-10-CM | POA: Diagnosis not present

## 2016-04-29 LAB — CBC WITH DIFFERENTIAL/PLATELET
BASO%: 0.6 % (ref 0.0–2.0)
Basophils Absolute: 0 10*3/uL (ref 0.0–0.1)
EOS%: 2.5 % (ref 0.0–7.0)
Eosinophils Absolute: 0.2 10*3/uL (ref 0.0–0.5)
HEMATOCRIT: 41.4 % (ref 34.8–46.6)
HEMOGLOBIN: 13.9 g/dL (ref 11.6–15.9)
LYMPH#: 2.6 10*3/uL (ref 0.9–3.3)
LYMPH%: 36.3 % (ref 14.0–49.7)
MCH: 28.4 pg (ref 25.1–34.0)
MCHC: 33.6 g/dL (ref 31.5–36.0)
MCV: 84.5 fL (ref 79.5–101.0)
MONO#: 0.7 10*3/uL (ref 0.1–0.9)
MONO%: 10.2 % (ref 0.0–14.0)
NEUT#: 3.6 10*3/uL (ref 1.5–6.5)
NEUT%: 50.4 % (ref 38.4–76.8)
PLATELETS: 186 10*3/uL (ref 145–400)
RBC: 4.9 10*6/uL (ref 3.70–5.45)
RDW: 14.3 % (ref 11.2–14.5)
WBC: 7.1 10*3/uL (ref 3.9–10.3)

## 2016-04-29 LAB — COMPREHENSIVE METABOLIC PANEL
ALBUMIN: 3.9 g/dL (ref 3.5–5.0)
ALK PHOS: 68 U/L (ref 40–150)
ALT: 28 U/L (ref 0–55)
AST: 24 U/L (ref 5–34)
Anion Gap: 13 mEq/L — ABNORMAL HIGH (ref 3–11)
BILIRUBIN TOTAL: 0.78 mg/dL (ref 0.20–1.20)
BUN: 26.3 mg/dL — AB (ref 7.0–26.0)
CALCIUM: 9.9 mg/dL (ref 8.4–10.4)
CO2: 28 mEq/L (ref 22–29)
CREATININE: 1.2 mg/dL — AB (ref 0.6–1.1)
Chloride: 99 mEq/L (ref 98–109)
EGFR: 45 mL/min/{1.73_m2} — ABNORMAL LOW (ref >90)
Glucose: 109 mg/dl (ref 70–140)
Potassium: 3.1 mEq/L — ABNORMAL LOW (ref 3.5–5.1)
Sodium: 139 mEq/L (ref 136–145)
TOTAL PROTEIN: 7.4 g/dL (ref 6.4–8.3)

## 2016-05-06 ENCOUNTER — Other Ambulatory Visit: Payer: Self-pay | Admitting: *Deleted

## 2016-05-06 ENCOUNTER — Telehealth: Payer: Self-pay | Admitting: Oncology

## 2016-05-06 ENCOUNTER — Encounter: Payer: Self-pay | Admitting: Nurse Practitioner

## 2016-05-06 ENCOUNTER — Ambulatory Visit (HOSPITAL_BASED_OUTPATIENT_CLINIC_OR_DEPARTMENT_OTHER): Payer: Medicare HMO | Admitting: Nurse Practitioner

## 2016-05-06 VITALS — BP 131/80 | HR 71 | Temp 98.6°F | Resp 18 | Ht 66.0 in | Wt 197.3 lb

## 2016-05-06 DIAGNOSIS — Z170 Estrogen receptor positive status [ER+]: Secondary | ICD-10-CM

## 2016-05-06 DIAGNOSIS — E860 Dehydration: Secondary | ICD-10-CM

## 2016-05-06 DIAGNOSIS — M8580 Other specified disorders of bone density and structure, unspecified site: Secondary | ICD-10-CM

## 2016-05-06 DIAGNOSIS — E876 Hypokalemia: Secondary | ICD-10-CM

## 2016-05-06 DIAGNOSIS — C50411 Malignant neoplasm of upper-outer quadrant of right female breast: Secondary | ICD-10-CM | POA: Diagnosis not present

## 2016-05-06 DIAGNOSIS — C773 Secondary and unspecified malignant neoplasm of axilla and upper limb lymph nodes: Secondary | ICD-10-CM | POA: Diagnosis not present

## 2016-05-06 MED ORDER — ANASTROZOLE 1 MG PO TABS: 1.0000 mg | ORAL_TABLET | Freq: Every day | ORAL | Status: DC

## 2016-05-06 MED ORDER — POTASSIUM CHLORIDE CRYS ER 20 MEQ PO TBCR: 20.0000 meq | EXTENDED_RELEASE_TABLET | Freq: Two times a day (BID) | ORAL | Status: DC

## 2016-05-06 NOTE — Progress Notes (Signed)
ID: Yvonne [name]   DOB: [birth year]  MR#: [id number]  NIO#:[id number]  PCP: Meredith Leeds GYN: SU: Madilyn Hook, MD OTHER MD:  CHIEF COMPLAINT: Estrogen receptor positive breast cancer  CURRENT TREATMENT: Anastrozole  BREAST CANCER HISTORY: From the original intake note:  The patient underwent screening mammography 03/02/2012. (Prior mammogram was 2005). Possible mass was noted in the right breast and she was recalled for additional views 03/25/2012. These showed a dense spiculated mass with overlying skin retraction in the upper outer quadrant of the right breast. There were associated coarse linear calcifications. On physical exam there was an area of dimpling in the upper outer quadrant of the right breast and a 5 cm mass was palpable. By ultrasound, this was irregular, hypoechoic, and measured 3.3 cm. In addition, an abnormal lymph node in the right axilla with the thickened cortex measured 1.8 cm.  In the opposite breast, Dr. Valinda Party found an irregular hypoechoic mass with lobulated margins measuring 2.3 cm. The left axilla was unremarkable. Bilateral breast MRI was obtained 04/01/2012, and showed the mass in question in the right breast to measure 3.8 cm maximally, with overlying skin retraction. In addition, there was a second enhancing bilobed mass in the lower outer quadrant of the right breast, measuring 1 point centimeter. This was felt to possibly represent an intramammary lymph node.  Biopsy of the right breast and right axillary lymph node 03/25/2012 both showed ([id number])  invasive ductal carcinoma, grade 2, estrogen and progesterone receptor positive, both at 100%, with an MIB-1 39%, and no HER-2 amplification. 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 After much discussion the patient decided on right modified radical mastectomy and left lumpectomy.   Her subsequent history  is as detailed below.  INTERVAL HISTORY: Yvonne [name] returns today for follow up of her estrogen receptor positive breast carcinoma, accompanied by her husband Yvonne [name]. She has been on anastrozole since August 2013 and tolerates this well. She does have hot flashes, but they do no occur every day. She stopped the gabapentin because she did not find that it was useful. She denies vaginal changes. Her arthritis predates the start of anastrozole and it has not gotten any worse. The celebrex she is prescribed is helpful.  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 She has no plans to retire. It keeps her active and she enjoys her coworkers. She does not exercise formally. She has no complaints today besides light spotting a few weeks ago, no more than a dime size and it only happened twice. A detailed review of systems is otherwise entirely negative.  PAST MEDICAL HISTORY: Past Medical History  Diagnosis Date  . Elevated cholesterol     takes Crestor daily  . Hypertension     takes Maxzide daily   . Phlebitis 8yr ago    hx of-  . Cancer 03/25/12    right breast  . Seasonal allergies     takes Zyrtec daily and Nasal Spray prn  . Headache(784.0)   . Arthritis   . Chronic back pain     arthritis  . Bruises easily   . Hemorrhoids   . Breast cancer 04/2012  . History of chemotherapy     x 1, pt unable to tolerate, on PO chemotherpay    PAST SURGICAL HISTORY: Past Surgical History  Procedure Laterality Date  . Appendectomy  89yrago  .  Tubal ligation  22yr ago  . Colonoscopy    . Portacath placement  04/26/2012    Procedure: INSERTION PORT-A-CATH;  Surgeon: BMadilyn Hook DO;  [facility]  Service: General;  Laterality: Left;  Started at 1746.  . Breast surgery  04/27/12    Right mod mastectomy, ER/PR +, HER2 -  . Breast surgery  04/27/12    Left Breast NL lumpectomy-no malignancy  . Port-a-cath removal  11/24/2012     Procedure: REMOVAL PORT-A-CATH;  Surgeon: BMadilyn Hook DO;  [facility]  Service: General;  Laterality: Left;    FAMILY HISTORY Family History  Problem Relation Age of Onset  . Cancer Mother     ovarian cancer  . Cancer Sister 528   colon cancer  . Cancer Brother 666   colon cancer  . Cancer Brother     prostate cancer, deceased  . Anesthesia problems Neg Hx   . Hypotension Neg Hx   . Malignant hyperthermia Neg Hx   . Pseudochol deficiency Neg Hx   . Leukemia Father   . Cancer Father     leukemia  the patient's father died at the age of 948 from "leukemia." the patient's mother died at the age of 549 for what may have been uterine or ovarian cancer. The patient had 4 brothers and 2 sisters one sister has a history of colon cancer diagnosed at age [id number] brother has a history of colon cancer diagnosed at age 73481a second brother died from complications of prostate cancer. There is no other breast cancer in the family to her knowledge.  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 She is married to [facility] who is disabled secondary to COPD. JKemanihad 5 children from a prior marriage. They are KSuzi [name] 38, who [facility], and works for [employer] Yvonne [name] 42, who is employed in c[employer] Yvonne Moros46 who works as a hSecretary/administrator and TProduction manager476who works as a CQuarry managerfor G[employer] The fifth child died at age 69 The patient has 13 grandchildren and 9 great grandchildren. She is not a cAmbulance person   ADVANCED DIRECTIVES: not in place  HEALTH MAINTENANCE: (Updated 06/06/2014) Social History  Substance Use  Topics  . Smoking status: Former Smoker    Quit date: 01/10/1984  . Smokeless tobacco: Never Used     Comment: quit 30+yrs ago  . Alcohol Use: No     Colonoscopy: 2007/ repeat is due  PAP: March 2012  Bone density:2005, osteopenia  Lipid panel:  Not on file   Allergies  Allergen Reactions  . Penicillins Anaphylaxis    Current Outpatient Prescriptions  Medication Sig Dispense Refill  . anastrozole (ARIMIDEX) 1 MG tablet Take 1 tablet (1 mg total) by mouth daily. 90 tablet 3  . aspirin 81 MG tablet Take 81 mg by mouth daily.    .Marland KitchenBAYER CONTOUR NEXT TEST test strip     . Calcium Carbonate-Vitamin D (CALCIUM + D PO) Take 1 tablet by mouth daily.    . celecoxib (CELEBREX) 200 MG capsule Take 200 mg by mouth daily.    . cetirizine (ZYRTEC) 10 MG chewable  tablet Chew 10 mg by mouth daily.    . fluticasone (FLONASE) 50 MCG/ACT nasal spray     . gabapentin (NEURONTIN) 300 MG capsule Take 1 capsule (300 mg total) by mouth at bedtime. 90 capsule 4  . ibuprofen (ADVIL,MOTRIN) 800 MG tablet     . metFORMIN (GLUCOPHAGE) 500 MG tablet     . Multiple Vitamins-Minerals (CERTAVITE SENIOR/ANTIOXIDANT) TABS Take 1 tablet by mouth.    . potassium chloride SA (K-DUR,KLOR-CON) 20 MEQ tablet Take 1 tablet (20 mEq total) by mouth 2 (two) times daily. 180 tablet 3  . rosuvastatin (CRESTOR) 20 MG tablet Take 20 mg by mouth daily.    Marland Kitchen triamterene-hydrochlorothiazide (MAXZIDE-25) 37.5-25 MG per tablet Take 1 tablet by mouth daily.    Marland Kitchen ZETIA 10 MG tablet Pt takes 1/2 tablet in am and 1/2 tablet in pm     No current facility-administered medications for this visit.    OBJECTIVE: middle-aged white woman who appears well Filed Vitals:   05/06/16 0954  BP: 131/80  Pulse: 71  Temp: 98.6 F (37 C)  Resp: 18     Body mass index is 31.86 kg/(m^2).    ECOG FS: 0 Filed Weights   05/06/16 0954  Weight: 197 lb 4.8 oz (89.495 kg)   Skin: warm, dry  HEENT: sclerae anicteric, conjunctivae pink, oropharynx  clear. No thrush or mucositis. Multiple teeth missing Lymph Nodes: No cervical or supraclavicular lymphadenopathy  Lungs: clear to auscultation bilaterally, no rales, wheezes, or rhonci  Heart: regular rate and rhythm  Abdomen: round, soft, non tender, positive bowel sounds  Musculoskeletal: No focal spinal tenderness, no peripheral edema  Neuro: non focal, well oriented, positive affect  Breasts: right breast status post mastectomy. No evidence of recurrent diease. Right axilla benign. Left breast unremarkable.  LAB RESULTS: Lab Results  Component Value Date   WBC 7.1 04/29/2016   NEUTROABS 3.6 04/29/2016   HGB 13.9 04/29/2016   HCT 41.4 04/29/2016   MCV 84.5 04/29/2016   PLT 186 04/29/2016      Chemistry      Component Value Date/Time   NA 139 04/29/2016 0908   NA 143 02/10/2013 0522   K 3.1* 04/29/2016 0908   K 3.3* 02/10/2013 0522   CL 99 02/20/2013 0758   CL 104 02/10/2013 0522   CO2 28 04/29/2016 0908   CO2 30 02/10/2013 0522   BUN 26.3* 04/29/2016 0908   BUN 14 02/10/2013 0522   CREATININE 1.2* 04/29/2016 0908   CREATININE 0.92 02/10/2013 0522      Component Value Date/Time   CALCIUM 9.9 04/29/2016 0908   CALCIUM 9.1 02/10/2013 0522   ALKPHOS 68 04/29/2016 0908   ALKPHOS 58 02/10/2013 0522   AST 24 04/29/2016 0908   AST 39* 02/10/2013 0522   ALT 28 04/29/2016 0908   ALT 36* 02/10/2013 0522   BILITOT 0.78 04/29/2016 0908   BILITOT 0.7 02/10/2013 0522      STUDIES: No results found.   EXAM: DIGITAL SCREENING UNILATERAL LEFT MAMMOGRAM WITH CAD  COMPARISON: Previous exam(s).  ACR Breast Density Category b: There are scattered areas of fibroglandular density.  FINDINGS: There are no findings suspicious for malignancy. There has been a right mastectomy. Images were processed with CAD.  IMPRESSION: No mammographic evidence of malignancy. A result letter of this screening mammogram will be mailed directly to the  patient.  RECOMMENDATION: Screening mammogram in one year. (Code:SM-B-01Y)  BI-RADS CATEGORY 1: Negative.   Electronically Signed  By: Altamese Cabal M.D.  On: 11/11/2015 14:  ASSESSMENT: 70 y.o.  [place] woman  (1) status post right modified radical mastectomy 04/26/2012 for a pT2 pN1, stage IIB invasive ductal carcinoma, grade 2, estrogen and progesterone both 100% positive, with an MIB-1 of 39%, and no HER-2 amplification.   (2) status post left lumpectomy 04/26/2012, benign  (3) adjuvant cyclophosphamide/ docetaxel given x1 (06/28/2012), the patient refusing further chemotherapy.  Declined radiation therapy.  (4) anastrozole started 07/21/2012, the plan being to continue for total of 5 years (until August 2018)  (a) bone density at [facility] 02/13/2004 at showed a T score of -1.75 (osteopenia)  (b) on calcium and vitamin D supplementation.   PLAN:  Mariyana is doing well as far as her breast cancer is concerned. She is now 4 years out from her definitive surgery with no evidence of recurrent disease. She is tolerating the anastrozole well and will continue this drug for 5 years of antiestrogen therapy (August 2018). She had lots of questions about continuing antiestrogen therapy for 10 years. This is generally not an issue for most patients, but she has not had a bone density scan in 12 years and I am concerned that her osteopenia has advanced. She continues on calcium and vitamin D supplements, but due to her poor dental health she is not eligible for bisphosphonate therapy. Continuing anastrozole in this setting puts her at increased risk for fracture in the future. She is willing to have a bone density scan with her mammogram this November.   The labs were reviewed in detail and were significant for 2 values. Her creatinine is 1.2 which suggests mild dehydration. She is a big soda drinker, so I have asked her to cut back and incorporate more water. Her  potassium is also down to 3.1. We discussed high potassium foods and altering her diet in general, as she is especially partial to "junk" food.   Magdalen will return in November for follow up. During this visit she will review the results of her mammogram and bone density scan, and make plans for 2018 in terms of continuing antiestrogen therapy. She understands and agrees with this plan. She knows the goal of treatment in her case is cure. She has been encouraged to call with any issues that might arise before her next visit here.    Laurie Panda, NP    05/06/2016

## 2016-05-06 NOTE — Telephone Encounter (Signed)
Gave and printed appt sched and avs fo rpt; for NOV  °

## 2016-09-18 ENCOUNTER — Other Ambulatory Visit: Payer: Self-pay | Admitting: Oncology

## 2016-09-18 DIAGNOSIS — E876 Hypokalemia: Secondary | ICD-10-CM

## 2016-09-18 DIAGNOSIS — C50411 Malignant neoplasm of upper-outer quadrant of right female breast: Secondary | ICD-10-CM

## 2016-11-02 ENCOUNTER — Ambulatory Visit: Payer: Medicare HMO | Admitting: Oncology

## 2016-11-11 ENCOUNTER — Ambulatory Visit
Admission: RE | Admit: 2016-11-11 | Discharge: 2016-11-11 | Disposition: A | Discharge status: Home / Self Care | Payer: Medicare HMO | Source: Ambulatory Visit | Attending: Nurse Practitioner | Admitting: Nurse Practitioner

## 2016-11-11 DIAGNOSIS — C50411 Malignant neoplasm of upper-outer quadrant of right female breast: Secondary | ICD-10-CM

## 2016-11-11 DIAGNOSIS — M8580 Other specified disorders of bone density and structure, unspecified site: Secondary | ICD-10-CM

## 2016-11-16 ENCOUNTER — Ambulatory Visit (HOSPITAL_BASED_OUTPATIENT_CLINIC_OR_DEPARTMENT_OTHER): Payer: Medicare HMO | Admitting: Oncology

## 2016-11-16 VITALS — BP 172/67 | HR 68 | Temp 98.1°F | Resp 18 | Ht 66.0 in | Wt 203.6 lb

## 2016-11-16 DIAGNOSIS — Z170 Estrogen receptor positive status [ER+]: Secondary | ICD-10-CM

## 2016-11-16 DIAGNOSIS — Z79811 Long term (current) use of aromatase inhibitors: Secondary | ICD-10-CM | POA: Diagnosis not present

## 2016-11-16 DIAGNOSIS — C50411 Malignant neoplasm of upper-outer quadrant of right female breast: Secondary | ICD-10-CM

## 2016-11-16 DIAGNOSIS — E876 Hypokalemia: Secondary | ICD-10-CM

## 2016-11-16 MED ORDER — KETOCONAZOLE 2 % EX CREA: 1.0000 "application " | TOPICAL_CREAM | Freq: Every day | CUTANEOUS | 0 refills | Status: DC

## 2016-11-16 MED ORDER — ANASTROZOLE 1 MG PO TABS: 1.0000 mg | ORAL_TABLET | Freq: Every day | ORAL | 3 refills | Status: DC

## 2016-11-16 MED ORDER — POTASSIUM CHLORIDE CRYS ER 20 MEQ PO TBCR: 20.0000 meq | EXTENDED_RELEASE_TABLET | Freq: Every day | ORAL | 0 refills | Status: DC

## 2016-11-16 NOTE — Progress Notes (Signed)
ID: Yvonne [name]   DOB: [birth year]  MR#: [id number]  OBS#:[id number]  PCP: Yvonne [name] GYN: SU: Yvonne Hook, MD OTHER MD:  CHIEF COMPLAINT: Estrogen receptor positive breast cancer  CURRENT TREATMENT: Anastrozole  BREAST CANCER HISTORY: From the original intake note:  The patient underwent screening mammography 03/02/2012. (Prior mammogram was 2005). Possible mass was noted in the right breast and she was recalled for additional views 03/25/2012. These showed a dense spiculated mass with overlying skin retraction in the upper outer quadrant of the right breast. There were associated coarse linear calcifications. On physical exam there was an area of dimpling in the upper outer quadrant of the right breast and a 5 cm mass was palpable. By ultrasound, this was irregular, hypoechoic, and measured 3.3 cm. In addition, an abnormal lymph node in the right axilla with the thickened cortex measured 1.8 cm.  In the opposite breast, Dr. Valinda [name] found an irregular hypoechoic mass with lobulated margins measuring 2.3 cm. The left axilla was unremarkable. Bilateral breast MRI was obtained 04/01/2012, and showed the mass in question in the right breast to measure 3.8 cm maximally, with overlying skin retraction. In addition, there was a second enhancing bilobed mass in the lower outer quadrant of the right breast, measuring 1 point centimeter. This was felt to possibly represent an intramammary lymph node.  Biopsy of the right breast and right axillary lymph node 03/25/2012 both showed ([id number])  invasive ductal carcinoma, grade 2, estrogen and progesterone receptor positive, both at 100%, with an MIB-1 39%, and no HER-2 amplification. 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 After much discussion the patient decided on right modified radical mastectomy and left lumpectomy.   Her subsequent history  is as detailed below.  INTERVAL HISTORY: Yvonne [name] returns today for follow up of her estrogen receptor positive breast carcinoma, accompanied by her husband Yvonne [name]. She continues on anastrozole, with good tolerance. Hot flashes and vaginal dryness are not a major issue. She never developed the arthralgias or myalgias that many patients can experience on this medication. She obtains it at a good price.  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 Generally she is doing "fine". She was recently started on gabapentin to take at bedtime for arthritis pain in her right leg. This is also helping her nighttime hot flashes. Aside from these issues a detailed review of systems today was stable  PAST MEDICAL HISTORY: Past Medical History:  Diagnosis Date  . Arthritis   . Breast cancer ([place]) 04/2012  . Bruises easily   . Cancer ([place]) 03/25/12   right breast  . Chronic back pain    arthritis  . Elevated cholesterol    takes Crestor daily  . Headache(784.0)   . Hemorrhoids   . History of chemotherapy    x 1, pt unable to tolerate, on PO chemotherpay  . Hypertension    takes Maxzide daily   . Phlebitis 35yr ago   hx of-  . Seasonal allergies    takes Zyrtec daily and Nasal Spray prn    PAST SURGICAL HISTORY: Past Surgical History:  Procedure Laterality Date  . APPENDECTOMY  157yrago  . BREAST SURGERY  04/27/12   Right mod mastectomy, ER/PR +, HER2 -  . BREAST SURGERY  04/27/12   Left Breast NL lumpectomy-no malignancy  . COLONOSCOPY    .  PORT-A-CATH REMOVAL  11/24/2012   Procedure: REMOVAL PORT-A-CATH;  Surgeon: Yvonne Hook, DO;  Location: [facility];  Service: General;  Laterality: Left;  . PORTACATH PLACEMENT  04/26/2012   Procedure: INSERTION PORT-A-CATH;  Surgeon: Yvonne Hook, DO;  Location: [facility];  Service: General;  Laterality: Left;  Started at 1746.  . TUBAL  LIGATION  4yr ago    FAMILY HISTORY Family History  Problem Relation Age of Onset  . Cancer Mother     ovarian cancer  . Cancer Sister 53   colon cancer  . Cancer Brother 648   colon cancer  . Cancer Brother     prostate cancer, deceased  . Anesthesia problems Neg Hx   . Hypotension Neg Hx   . Malignant hyperthermia Neg Hx   . Pseudochol deficiency Neg Hx   . Leukemia Father   . Cancer Father     leukemia  the patient's father died at the age of 923 from "leukemia." the patient's mother died at the age of 568 for what may have been uterine or ovarian cancer. The patient had 4 brothers and 2 sisters one sister has a history of colon cancer diagnosed at age 4333one brother has a history of colon cancer diagnosed at age 4962a second brother died from complications of prostate cancer. There is no other breast cancer in the family to her knowledge.  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 She is married to [name] who is disabled secondary to COPD. [name] 5 children from a prior marriage. They are KSuzi Roots 38, who was also present [facility] the [facility], and works for [employer] MJason Coop 42, who is employed in cArchitect SHavery Moros46 who works as a hSecretary/administrator and TProduction manager456who works as a CQuarry managerfor GOffice [employer] The fifth child died at age 69 The patient has 13 grandchildren and 9 great grandchildren. She is not a cAmbulance person   ADVANCED DIRECTIVES: not in place  HEALTH MAINTENANCE: (Updated 06/06/2014) Social History  Substance Use Topics  . Smoking status: Former Smoker    Quit date: 01/10/1984  . Smokeless tobacco: Never Used     Comment: quit  30+yrs ago  . Alcohol use No     Colonoscopy: 2007/ repeat is due  PAP: March 2012  Bone density:2005, osteopenia  Lipid panel:  Not on file   Allergies  Allergen Reactions  . Penicillins Anaphylaxis    Current Outpatient Prescriptions  Medication Sig Dispense Refill  . anastrozole (ARIMIDEX) 1 MG tablet Take 1 tablet (1 mg total) by mouth daily. 90 tablet 3  . aspirin 81 MG tablet Take 81 mg by mouth daily.    .Marland KitchenBAYER CONTOUR NEXT TEST test strip     . Calcium Carbonate-Vitamin D (CALCIUM + D PO) Take 1 tablet by mouth daily.    . cetirizine (ZYRTEC) 10 MG chewable tablet Chew 10 mg by mouth daily.    . fluticasone (FLONASE) 50 MCG/ACT nasal spray     . gabapentin (NEURONTIN) 300 MG capsule Take 1 capsule (300 mg total) by mouth at bedtime. 90 capsule 4  . ibuprofen (ADVIL,MOTRIN) 800 MG tablet     .  ketoconazole (NIZORAL) 2 % cream Apply 1 application topically daily. 15 g 0  . metFORMIN (GLUCOPHAGE) 500 MG tablet     . Multiple Vitamins-Minerals (CERTAVITE SENIOR/ANTIOXIDANT) TABS Take 1 tablet by mouth.    . potassium chloride SA (K-DUR,KLOR-CON) 20 MEQ tablet Take 1 tablet (20 mEq total) by mouth daily. 180 tablet 0  . rosuvastatin (CRESTOR) 20 MG tablet Take 20 mg by mouth daily.    Marland Kitchen triamterene-hydrochlorothiazide (MAXZIDE-25) 37.5-25 MG per tablet Take 1 tablet by mouth daily.    Marland Kitchen ZETIA 10 MG tablet Pt takes 1/2 tablet in am and 1/2 tablet in pm     No current facility-administered medications for this visit.     OBJECTIVE: middle-aged white woman Vitals:   11/16/16 0912  BP: (!) 172/67  Pulse: 68  Resp: 18  Temp: 98.1 F (36.7 C)     Body mass index is 32.86 kg/m.    ECOG FS: 0 Filed Weights   11/16/16 0912  Weight: 203 lb 9.6 oz (92.4 kg)   Sclerae unicteric, EOMs intact Oropharynx clear and moist No cervical or supraclavicular adenopathy Lungs no rales or rhonchi Heart regular rate and rhythm Abd soft, nontender, positive bowel sounds MSK no focal  spinal tenderness, no upper extremity lymphedema Neuro: nonfocal, well oriented, appropriate affect Breasts: the right breast is status post mastectomy. There is no evidence of chest wall recurrence. Right axilla is benign. Left breast is unremarkable except that there is a yeast rash in the inferior mammary fold   LAB RESULTS: Lab Results  Component Value Date   WBC 7.1 04/29/2016   NEUTROABS 3.6 04/29/2016   HGB 13.9 04/29/2016   HCT 41.4 04/29/2016   MCV 84.5 04/29/2016   PLT 186 04/29/2016      Chemistry      Component Value Date/Time   NA 139 04/29/2016 0908   K 3.1 (L) 04/29/2016 0908   CL 99 02/20/2013 0758   CO2 28 04/29/2016 0908   BUN 26.3 (H) 04/29/2016 0908   CREATININE 1.2 (H) 04/29/2016 0908      Component Value Date/Time   CALCIUM 9.9 04/29/2016 0908   ALKPHOS 68 04/29/2016 0908   AST 24 04/29/2016 0908   ALT 28 04/29/2016 0908   BILITOT 0.78 04/29/2016 0908      STUDIES: Dg Bone Density  Result Date: 11/11/2016 EXAM: DUAL X-RAY ABSORPTIOMETRY (DXA) FOR BONE MINERAL DENSITY IMPRESSION: Referring Physician:  Laurie Panda PATIENT: Name: Yvonne [name], Loyal Patient ID: [id number] Birth Date: 02/18/1946 Height: 64.0 in. Sex: Female Measured: 11/11/2016 Weight: 205.0 lbs. Indications: Anastrazole, Breast Cancer History, Caucasian, Estrogen Deficient, History of Osteopenia, Postmenopausal, Secondary Osteoporosis Fractures: None Treatments: Calcium (E943.0), Vitamin D (E933.5) ASSESSMENT: The BMD measured at Femur Neck Left is 0.777 g/cm2 with a T-score of -1.9. This patient is considered osteopenic according to [facility]) criteria. Lumbar spine was not utilized due to advanced degenerative changes.Forearms not used due to bilateral fractures. Site Region Measured Date Measured Age YA BMD Significant CHANGE T-score DualFemur Neck Left 11/11/2016    [age]         -1.9    0.777 g/cm2 World Health Organization [facility]) criteria for post-menopausal,  Caucasian Women: Normal       T-score at or above -1 SD Osteopenia   T-score between -1 and -2.5 SD Osteoporosis T-score at or below -2.5 SD RECOMMENDATION: [place] recommends that FDA-approved medical therapies be considered in postmenopausal women and men age 59 or older with a:  1. Hip or vertebral (clinical or morphometric) fracture. 2. T-score of <-2.5 at the spine or hip. 3. Ten-year fracture probability by FRAX of 3% or greater for hip fracture or 20% or greater for major osteoporotic fracture. All treatment decisions require clinical judgment and consideration of individual patient factors, including patient preferences, co-morbidities, previous drug use, risk factors not captured in the FRAX model (e.g. falls, vitamin D deficiency, increased bone turnover, interval significant decline in bone density) and possible under - or over-estimation of fracture risk by FRAX. All patients should ensure an adequate intake of dietary calcium (1200 mg/d) and vitamin D (800 IU daily) unless contraindicated. FOLLOW-UP: People with diagnosed cases of osteoporosis or at high risk for fracture should have regular bone mineral density tests. For patients eligible for Medicare, routine testing is allowed once every 2 years. The testing frequency can be increased to one year for patients who have rapidly progressing disease, those who are receiving or discontinuing medical therapy to restore bone mass, or have additional risk factors. I have reviewed this report, and agree with the above findings. [facility] FRAX* 10-year Probability of Fracture Based on femoral neck BMD: DualFemur (Left) Major Osteoporotic Fracture: 10.4% Hip Fracture:                1.8% Population:                  [place] (Caucasian) Risk Factors:                Secondary Osteoporosis *FRAX is a Materials engineer of the [employer] of [employer] for Metabolic Bone Disease, a [place] (WHO)  [facility]. ASSESSMENT: The probability of a major osteoporotic fracture is 10.4 % within the next ten years. The probability of a hip fracture is 1.8 % within the next ten years. Electronically Signed   By: Rolm Baptise M.D.   On: 11/11/2016 09:51   Mm Digital Screening Unilat L  Result Date: 11/14/2016 CLINICAL DATA:  Screening. EXAM: DIGITAL SCREENING UNILATERAL LEFT MAMMOGRAM WITH CAD COMPARISON:  Previous exam(s). ACR Breast Density Category b: There are scattered areas of fibroglandular density. FINDINGS: The patient has had a right mastectomy. There are no findings suspicious for malignancy. Images were processed with CAD. IMPRESSION: No mammographic evidence of malignancy. A result letter of this screening mammogram will be mailed directly to the patient. RECOMMENDATION: Screening mammogram in one year.  (Code:SM-L-52M) BI-RADS CATEGORY  1: Negative. Electronically Signed   By: Dorise Bullion III M.D   On: 11/14/2016 10:37   ASSESSMENT: 70 y.o.  [place] woman  (1) status post right modified radical mastectomy 04/26/2012 for a pT2 pN1, stage IIB invasive ductal carcinoma, grade 2, estrogen and progesterone both 100% positive, with an MIB-1 of 39%, and no HER-2 amplification.   (2) status post left lumpectomy 04/26/2012, benign  (3) adjuvant cyclophosphamide/ docetaxel given x1 (06/28/2012), the patient refusing further chemotherapy.  Declined radiation therapy.  (4) anastrozole started 07/21/2012, the plan being to continue for total of 5 years (until August 2018)  (a) bone density at [facility] 02/13/2004 at showed a T score of -1.75 (osteopenia)  (b) on calcium and vitamin D supplementation.  (c) the bone density scan 11/11/2016 showed a T score of minus 1.9    PLAN:  Aunya is now 4-1/2 years out from definitive surgery for her breast cancer with no evidence of disease recurrence. This is very favorable.  She is tolerating anastrozole well and the plan  is to  continue that for a total of 5 years, which will take [place] to August of next year.  I refilled her anastrozole and potassium and I also added miconazole cream for her left breast dermatophytic rash.  We discussed her bone density, which is essentially stable. She will continue on calcium and vitamin D supplementation and I encouraged her to continue to walk a minimum of 5000 steps a day.  She will see me again in one year, after her next mammogram. At that time she will likely "graduate" from follow-up  Chauncey Cruel, MD    11/16/2016

## 2017-01-01 ENCOUNTER — Other Ambulatory Visit: Payer: Self-pay | Admitting: Oncology

## 2017-06-22 ENCOUNTER — Other Ambulatory Visit: Payer: Self-pay | Admitting: *Deleted

## 2017-06-25 ENCOUNTER — Other Ambulatory Visit: Payer: Self-pay | Admitting: *Deleted

## 2017-10-25 ENCOUNTER — Other Ambulatory Visit: Payer: Self-pay | Admitting: Internal Medicine

## 2017-10-25 DIAGNOSIS — Z1231 Encounter for screening mammogram for malignant neoplasm of breast: Secondary | ICD-10-CM

## 2017-11-08 ENCOUNTER — Other Ambulatory Visit: Payer: Self-pay | Admitting: *Deleted

## 2017-11-08 DIAGNOSIS — C50411 Malignant neoplasm of upper-outer quadrant of right female breast: Secondary | ICD-10-CM

## 2017-11-09 ENCOUNTER — Other Ambulatory Visit (HOSPITAL_BASED_OUTPATIENT_CLINIC_OR_DEPARTMENT_OTHER): Payer: Medicare HMO

## 2017-11-09 DIAGNOSIS — C50411 Malignant neoplasm of upper-outer quadrant of right female breast: Secondary | ICD-10-CM | POA: Diagnosis not present

## 2017-11-09 LAB — CBC WITH DIFFERENTIAL/PLATELET
BASO%: 0.8 % (ref 0.0–2.0)
Basophils Absolute: 0.1 10*3/uL (ref 0.0–0.1)
EOS%: 4.1 % (ref 0.0–7.0)
Eosinophils Absolute: 0.3 10*3/uL (ref 0.0–0.5)
HEMATOCRIT: 41.4 % (ref 34.8–46.6)
HGB: 13.5 g/dL (ref 11.6–15.9)
LYMPH#: 2.9 10*3/uL (ref 0.9–3.3)
LYMPH%: 40.3 % (ref 14.0–49.7)
MCH: 28.9 pg (ref 25.1–34.0)
MCHC: 32.6 g/dL (ref 31.5–36.0)
MCV: 88.7 fL (ref 79.5–101.0)
MONO#: 0.7 10*3/uL (ref 0.1–0.9)
MONO%: 9.7 % (ref 0.0–14.0)
NEUT%: 45.1 % (ref 38.4–76.8)
NEUTROS ABS: 3.2 10*3/uL (ref 1.5–6.5)
PLATELETS: 194 10*3/uL (ref 145–400)
RBC: 4.67 10*6/uL (ref 3.70–5.45)
RDW: 14.3 % (ref 11.2–14.5)
WBC: 7.1 10*3/uL (ref 3.9–10.3)

## 2017-11-09 LAB — COMPREHENSIVE METABOLIC PANEL
ALK PHOS: 64 U/L (ref 40–150)
ALT: 19 U/L (ref 0–55)
ANION GAP: 11 meq/L (ref 3–11)
AST: 19 U/L (ref 5–34)
Albumin: 3.9 g/dL (ref 3.5–5.0)
BILIRUBIN TOTAL: 0.6 mg/dL (ref 0.20–1.20)
BUN: 22.5 mg/dL (ref 7.0–26.0)
CALCIUM: 10.3 mg/dL (ref 8.4–10.4)
CO2: 29 mEq/L (ref 22–29)
CREATININE: 0.9 mg/dL (ref 0.6–1.1)
Chloride: 101 mEq/L (ref 98–109)
EGFR: >60 mL/min/{1.73_m2} (ref >60)
Glucose: 91 mg/dl (ref 70–140)
Potassium: 3.9 mEq/L (ref 3.5–5.1)
Sodium: 141 mEq/L (ref 136–145)
TOTAL PROTEIN: 7.6 g/dL (ref 6.4–8.3)

## 2017-11-11 NOTE — Progress Notes (Signed)
ID: Yvonne [name]   DOB: 1946/03/20  MR#: [id number]  CSN#:[id number]  PCP: No primary care provider on file. GYN: SU: Madilyn Hook, MD OTHER MD:  CHIEF COMPLAINT: Estrogen receptor positive breast cancer  CURRENT TREATMENT: Anastrozole  BREAST CANCER HISTORY: From the original intake note:  The patient underwent screening mammography 03/02/2012. (Prior mammogram was 2005). Possible mass was noted in the right breast and she was recalled for additional views 03/25/2012. These showed a dense spiculated mass with overlying skin retraction in the upper outer quadrant of the right breast. There were associated coarse linear calcifications. On physical exam there was an area of dimpling in the upper outer quadrant of the right breast and a 5 cm mass was palpable. By ultrasound, this was irregular, hypoechoic, and measured 3.3 cm. In addition, an abnormal lymph node in the right axilla with the thickened cortex measured 1.8 cm.  In the opposite breast, Dr. Valinda Party found an irregular hypoechoic mass with lobulated margins measuring 2.3 cm. The left axilla was unremarkable. Bilateral breast MRI was obtained 04/01/2012, and showed the mass in question in the right breast to measure 3.8 cm maximally, with overlying skin retraction. In addition, there was a second enhancing bilobed mass in the lower outer quadrant of the right breast, measuring 1 point centimeter. This was felt to possibly represent an intramammary lymph node.  Biopsy of the right breast and right axillary lymph node 03/25/2012 both showed ([id number])  invasive ductal carcinoma, grade 2, estrogen and progesterone receptor positive, both at 100%, with an MIB-1 39%, and no HER-2 amplification. 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 After much discussion the patient decided on right modified radical mastectomy and left lumpectomy.   Her  subsequent history is as detailed below.  INTERVAL HISTORY: Collin returns today for follow-up and treatment of her estrogen receptor positive breast cancer.  She continues on anastrozole, with good tolerance. She has moderate hot flashes but denies vaginal dryness.   She is scheduled for mammography 11/19/2017, and had a bone density 11/11/2016 showing a T score of -1.9.   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 For exercise, she walks while working. Her new PCP is Dr. Sandi Mariscal with [facility] and can be reached at fax number: [phone]. She has her pap smears competed by Dr. Nancy Fetter. She denies having a gynecologist. She denies unusual headaches, visual changes, nausea, vomiting, or dizziness. There has been no unusual cough, phlegm production, or pleurisy. This been no change in bowel or bladder habits. She denies unexplained fatigue or unexplained weight loss, bleeding, rash, or fever. A detailed review of systems was otherwise stable.    PAST MEDICAL HISTORY: Past Medical History:  Diagnosis Date  . Arthritis   . Breast cancer ([place]) 04/2012  . Bruises easily   . Cancer ([place]) 03/25/12   right breast  . Chronic back pain    arthritis  . Elevated cholesterol    takes Crestor daily  . Headache(784.0)   . Hemorrhoids   . History of chemotherapy    x 1, pt unable to tolerate, on PO chemotherpay  . Hypertension    takes Maxzide daily   . Phlebitis 44yr ago   hx of-  . Seasonal allergies    takes Zyrtec daily and Nasal Spray prn    PAST SURGICAL HISTORY: Past Surgical History:  Procedure Laterality Date  .  APPENDECTOMY  62yr ago  . BREAST SURGERY  04/27/12   Right mod mastectomy, ER/PR +, HER2 -  . BREAST SURGERY  04/27/12   Left Breast NL lumpectomy-no malignancy  . COLONOSCOPY    . PORT-A-CATH REMOVAL  11/24/2012   Procedure: REMOVAL PORT-A-CATH;  Surgeon: BMadilyn Hook DO;  [place]   Service: General;  Laterality: Left;  . PORTACATH PLACEMENT  04/26/2012   Procedure: INSERTION PORT-A-CATH;  Surgeon: BMadilyn Hook DO;  [place]  Service: General;  Laterality: Left;  Started at [age].  . TUBAL LIGATION  89yrago    FAMILY HISTORY Family History  Problem Relation Age of Onset  . Cancer Mother        ovarian cancer  . Cancer Sister 5748     colon cancer  . Cancer Brother 6053     colon cancer  . Cancer Brother        prostate cancer, deceased  . Anesthesia problems Neg Hx   . Hypotension Neg Hx   . Malignant hyperthermia Neg Hx   . Pseudochol deficiency Neg Hx   . Leukemia Father   . Cancer Father        leukemia  the patient's father died at the age of 9281from "leukemia." the patient's mother died at the age of 5340for what may have been uterine or ovarian cancer. The patient had 4 brothers and 2 sisters one sister has a history of colon cancer diagnosed at age 10484ne brother has a history of colon cancer diagnosed at age 70 second brother died from complications of prostate cancer. There is no other breast cancer in the family to her knowledge.  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 She is married to JaHuntsville Memorial Hospitalwho is disabled secondary to COPD. JuKamilead 5 children from a prior marriage. They are KiSuzi Roots38, who was also present at the MDChrist[facility]isit, and works for Wa[employer] CorporationMiJason Coop42, who is employed in coArchitectSaHavery Moros6 who works as a hoSecretary/administratorand TaProduction manager762ho works as a CNQuarry manageror GuOffice DepotThe fifth child died at age 7176The patient has 13 grandchildren and 9 great grandchildren. She is not a chScientist, research (life sciences)  ADVANCED DIRECTIVES: not in place  HEALTH MAINTENANCE: (Updated 06/06/2014) Social History   Tobacco Use  . Smoking status: Former Smoker    Last attempt to quit: 01/10/1984    Years since quitting: 33.8  . Smokeless tobacco: Never Used  . Tobacco comment: quit 30+yrs ago  Substance Use Topics  . Alcohol use: No  . Drug use: No     Colonoscopy: 2007/ repeat is due  PAP: March 2012  Bone density:2005, osteopenia  Lipid panel:  Not on file   Allergies  Allergen Reactions  . Penicillins Anaphylaxis    Current Outpatient Medications  Medication Sig Dispense Refill  . anastrozole (ARIMIDEX) 1 MG tablet Take 1 tablet (1 mg total) by mouth daily. 90 tablet 3  . aspirin 81 MG tablet Take 81 mg by mouth daily.    . Marland KitchenAYER CONTOUR NEXT TEST test strip     . Calcium Carbonate-Vitamin D (  CALCIUM + D PO) Take 1 tablet by mouth daily.    . cetirizine (ZYRTEC) 10 MG chewable tablet Chew 10 mg by mouth daily.    . fluticasone (FLONASE) 50 MCG/ACT nasal spray     . gabapentin (NEURONTIN) 300 MG capsule Take 1 capsule (300 mg total) by mouth at bedtime. 90 capsule 4  . ibuprofen (ADVIL,MOTRIN) 800 MG tablet     . ketoconazole (NIZORAL) 2 % cream Apply 1 application topically daily. 15 g 0  . metFORMIN (GLUCOPHAGE) 500 MG tablet     . Multiple Vitamins-Minerals (CERTAVITE SENIOR/ANTIOXIDANT) TABS Take 1 tablet by mouth.    . potassium chloride SA (K-DUR,KLOR-CON) 20 MEQ tablet Take 1 tablet (20 mEq total) by mouth daily. 180 tablet 0  . rosuvastatin (CRESTOR) 20 MG tablet Take 20 mg by mouth daily.    Marland Kitchen triamterene-hydrochlorothiazide (MAXZIDE-25) 37.5-25 MG per tablet Take 1 tablet by mouth daily.    Marland Kitchen ZETIA 10 MG tablet Pt takes 1/2 tablet in am and 1/2 tablet in pm     No current facility-administered medications for this visit.     OBJECTIVE: middle-aged white woman in no acute distress Vitals:   11/16/17 0847  BP: (!) 172/88  Pulse: 71  Resp: 17  Temp: 98.1 F (36.7  C)  SpO2: 99%     Body mass index is 30.57 kg/m.    ECOG FS: 0 Filed Weights   11/16/17 0847  Weight: 189 lb 6.4 oz (85.9 kg)   Sclerae unicteric, pupils round and equal Oropharynx clear and moist, dentition poor No cervical or supraclavicular adenopathy Lungs no rales or rhonchi Heart regular rate and rhythm Abd soft, nontender, positive bowel sounds MSK no focal spinal tenderness, no upper extremity lymphedema Neuro: nonfocal, well oriented, appropriate affect Breasts: The right breast is undergone mastectomy.  There is no evidence of chest wall recurrence.  The left breast is benign.  Both axillae are benign.  LAB RESULTS: Lab Results  Component Value Date   WBC 7.1 11/09/2017   NEUTROABS 3.2 11/09/2017   HGB 13.5 11/09/2017   HCT 41.4 11/09/2017   MCV 88.7 11/09/2017   PLT 194 11/09/2017      Chemistry      Component Value Date/Time   NA 141 11/09/2017 0849   K 3.9 11/09/2017 0849   CL 99 02/20/2013 0758   CO2 29 11/09/2017 0849   BUN 22.5 11/09/2017 0849   CREATININE 0.9 11/09/2017 0849      Component Value Date/Time   CALCIUM 10.3 11/09/2017 0849   ALKPHOS 64 11/09/2017 0849   AST 19 11/09/2017 0849   ALT 19 11/09/2017 0849   BILITOT 0.60 11/09/2017 0849      STUDIES: She is due for left mammogram 11/19/2017  ASSESSMENT: 71 y.o.   woman  (1) status post right modified radical mastectomy 04/26/2012 for a pT2 pN1, stage IIB invasive ductal carcinoma, grade 2, estrogen and progesterone both 100% positive, with an MIB-1 of 39%, and no HER-2 amplification.   (2) status post left lumpectomy 04/26/2012, benign  (3) adjuvant cyclophosphamide/ docetaxel given x1 (06/28/2012), the patient refusing further chemotherapy.  Declined radiation therapy.  (4) anastrozole started 07/21/2012, completing 5+ years 11/16/2017  (a) bone density at [place] 02/13/2004 at showed a T score of -1.75 (osteopenia)  (b) on calcium and vitamin D  supplementation.  (c) the bone density scan 11/11/2016 showed a T score of minus 1.9    PLAN:  Lilliann is now 5-1/2 years out  from definitive surgery for her breast cancer with no evidence of disease recurrence.  This is very favorable.  She completed just over 5 years of anastrozole.  The benefit in her case of continuing for an additional 2 years would be minimal, in the 1% range.  I am very comfortable with her stopping at this point and she is very happy to have that option  I am also comfortable releasing her to her primary care physician.  All she will need in terms of breast cancer follow-up is a yearly left mammogram and a yearly physician breast and chest wall exam.  Munirah has taken herself off all medications except potassium.  In particular she is now on no blood pressure medication, no blood sugar control pills, and no cholesterol medications.  She will review this with her primary care physician at their next visit.  I am not making any further routine appointments for Agatha here, but I will be glad to see her at any point in the future if and when the need arises.  Robyn Galati, Virgie Dad, MD  11/16/17 8:55 AM Medical Oncology and Hematology [facility] [address] [place], [place] [address] Tel. [phone]    Fax. [phone]    This document serves as a record of services personally performed by Lurline Del, MD. It was created on his behalf by Steva Colder, a trained medical scribe. The creation of this record is based on the scribe's personal observations and the provider's statements to them.   I have reviewed the above documentation for accuracy and completeness, and I agree with the above.

## 2017-11-16 ENCOUNTER — Telehealth: Payer: Self-pay | Admitting: Oncology

## 2017-11-16 ENCOUNTER — Encounter: Payer: Self-pay | Admitting: Oncology

## 2017-11-16 ENCOUNTER — Ambulatory Visit (HOSPITAL_BASED_OUTPATIENT_CLINIC_OR_DEPARTMENT_OTHER): Payer: Medicare HMO | Admitting: Oncology

## 2017-11-16 ENCOUNTER — Telehealth: Payer: Self-pay | Admitting: Internal Medicine

## 2017-11-16 VITALS — BP 172/88 | HR 71 | Temp 98.1°F | Resp 17 | Ht 66.0 in | Wt 189.4 lb

## 2017-11-16 DIAGNOSIS — N951 Menopausal and female climacteric states: Secondary | ICD-10-CM

## 2017-11-16 DIAGNOSIS — C50411 Malignant neoplasm of upper-outer quadrant of right female breast: Secondary | ICD-10-CM

## 2017-11-16 DIAGNOSIS — Z853 Personal history of malignant neoplasm of breast: Secondary | ICD-10-CM | POA: Diagnosis not present

## 2017-11-16 DIAGNOSIS — E876 Hypokalemia: Secondary | ICD-10-CM

## 2017-11-16 DIAGNOSIS — M8580 Other specified disorders of bone density and structure, unspecified site: Secondary | ICD-10-CM

## 2017-11-16 DIAGNOSIS — Z170 Estrogen receptor positive status [ER+]: Principal | ICD-10-CM

## 2017-11-16 MED ORDER — POTASSIUM CHLORIDE CRYS ER 20 MEQ PO TBCR: 20.0000 meq | EXTENDED_RELEASE_TABLET | Freq: Every day | ORAL | 0 refills | Status: DC

## 2017-11-16 NOTE — Telephone Encounter (Signed)
No 11/27 los. °

## 2017-11-19 ENCOUNTER — Ambulatory Visit
Admission: RE | Admit: 2017-11-19 | Discharge: 2017-11-19 | Disposition: A | Discharge status: Home / Self Care | Payer: Medicare HMO | Source: Ambulatory Visit | Attending: Internal Medicine | Admitting: Internal Medicine

## 2017-11-19 DIAGNOSIS — Z1231 Encounter for screening mammogram for malignant neoplasm of breast: Secondary | ICD-10-CM

## 2017-12-18 ENCOUNTER — Other Ambulatory Visit: Payer: Self-pay | Admitting: Oncology

## 2017-12-19 ENCOUNTER — Other Ambulatory Visit: Payer: Self-pay | Admitting: Oncology

## 2017-12-19 DIAGNOSIS — C50411 Malignant neoplasm of upper-outer quadrant of right female breast: Secondary | ICD-10-CM

## 2017-12-19 DIAGNOSIS — E876 Hypokalemia: Secondary | ICD-10-CM

## 2018-11-21 ENCOUNTER — Other Ambulatory Visit: Payer: Self-pay | Admitting: Internal Medicine

## 2018-11-21 DIAGNOSIS — Z1231 Encounter for screening mammogram for malignant neoplasm of breast: Secondary | ICD-10-CM

## 2018-12-30 ENCOUNTER — Other Ambulatory Visit: Payer: Self-pay | Admitting: Oncology

## 2018-12-30 DIAGNOSIS — C50411 Malignant neoplasm of upper-outer quadrant of right female breast: Secondary | ICD-10-CM

## 2018-12-30 DIAGNOSIS — E876 Hypokalemia: Secondary | ICD-10-CM

## 2019-01-03 ENCOUNTER — Ambulatory Visit
Admission: RE | Admit: 2019-01-03 | Discharge: 2019-01-03 | Disposition: A | Discharge status: Home / Self Care | Payer: Medicare HMO | Source: Ambulatory Visit | Attending: Internal Medicine | Admitting: Internal Medicine

## 2019-01-03 DIAGNOSIS — Z1231 Encounter for screening mammogram for malignant neoplasm of breast: Secondary | ICD-10-CM

## 2019-01-04 ENCOUNTER — Other Ambulatory Visit: Payer: Self-pay | Admitting: Oncology

## 2019-01-04 DIAGNOSIS — E876 Hypokalemia: Secondary | ICD-10-CM

## 2019-01-04 DIAGNOSIS — C50411 Malignant neoplasm of upper-outer quadrant of right female breast: Secondary | ICD-10-CM

## 2019-06-12 ENCOUNTER — Other Ambulatory Visit: Payer: Self-pay | Admitting: Nurse Practitioner

## 2019-09-08 DIAGNOSIS — Z1159 Encounter for screening for other viral diseases: Secondary | ICD-10-CM | POA: Diagnosis not present

## 2019-09-08 DIAGNOSIS — R5383 Other fatigue: Secondary | ICD-10-CM | POA: Diagnosis not present

## 2019-09-08 DIAGNOSIS — R0602 Shortness of breath: Secondary | ICD-10-CM | POA: Diagnosis not present

## 2019-09-08 DIAGNOSIS — Z0000 Encounter for general adult medical examination without abnormal findings: Secondary | ICD-10-CM | POA: Diagnosis not present

## 2019-09-08 DIAGNOSIS — E559 Vitamin D deficiency, unspecified: Secondary | ICD-10-CM | POA: Diagnosis not present

## 2019-09-08 DIAGNOSIS — E7800 Pure hypercholesterolemia, unspecified: Secondary | ICD-10-CM | POA: Diagnosis not present

## 2019-09-08 DIAGNOSIS — I10 Essential (primary) hypertension: Secondary | ICD-10-CM | POA: Diagnosis not present

## 2019-09-08 DIAGNOSIS — E1165 Type 2 diabetes mellitus with hyperglycemia: Secondary | ICD-10-CM | POA: Diagnosis not present

## 2019-09-08 DIAGNOSIS — Z79899 Other long term (current) drug therapy: Secondary | ICD-10-CM | POA: Diagnosis not present

## 2019-10-02 ENCOUNTER — Other Ambulatory Visit: Payer: Self-pay

## 2019-10-02 NOTE — Patient Outreach (Signed)
  [facility]) Care Management Chronic Special Needs Program  10/02/2019  Name: Yvonne [name] DOB: [birth year]  MRN: [id number]  Yvonne [name] is enrolled in a chronic special needs plan for  Diabetes. A completed health risk assessment has not been received from the client and client has not responded to 3 outreach attempts by their health care concierge. Attempted outreach call.  No answer and HIPAA compliant message left The client's individualized care plan was developed based on available data.  Plan:  . Send unsuccessful outreach letter with a copy of individualized care plan to client . Send individualized care plan to provider . Educational materials-HTN   Chronic care Nurse, children's will attempt outreach in 1-2 months.    Peter Garter RN, Jackquline Denmark, CDE Chronic Care Management Coordinator [place] [facility] [phone]

## 2019-10-27 ENCOUNTER — Other Ambulatory Visit: Payer: Self-pay

## 2019-10-27 NOTE — Patient Outreach (Signed)
  [facility]) Care Management Chronic Special Needs Program  10/27/2019  Name: Yvonne [name] DOB: 21-Mar-1946  MRN: [id number]  Ms. [name] is enrolled in a chronic special needs plan for Diabetes. Client called with no answer No answer and HIPAA compliant message left. 1st attempt Plan for 2nd outreach call in one week Chronic care management coordinator will attempt outreach in one week.   Peter Garter RN, Jackquline Denmark, CDE Chronic Care Management Coordinator [place] [facility] [phone]

## 2019-11-03 ENCOUNTER — Other Ambulatory Visit: Payer: Self-pay

## 2019-11-03 NOTE — Patient Outreach (Signed)
  [facility]) Care Management Chronic Special Needs Program  11/03/2019  Name: Yvonne [name] DOB: January 17, 1946  MRN: [id number]  Ms. [name] is enrolled in a chronic special needs plan for Diabetes. Client called with no answer No answer and HIPAA compliant message left. 2nd attempt Plan for 3rd outreach call in one week Chronic care management coordinator will attempt outreach in one week.   Peter Garter RN, Jackquline Denmark, CDE Chronic Care Management Coordinator [place] [facility] [phone]

## 2019-11-09 ENCOUNTER — Other Ambulatory Visit: Payer: Self-pay

## 2019-11-09 NOTE — Patient Outreach (Signed)
  [facility]) Care Management Chronic Special Needs Program  11/09/2019  Name: CAPREE MCCATHERN DOB: 1946/06/05  MRN: [id number]  Ms. Tajane Abud is enrolled in a chronic special needs plan for Diabetes. Client called with no answer No answer and HIPAA compliant message left. 3 attempt  Client has not completed a Health Risk assessment and has not responded to outreach from [place] to send unsuccessful outreach letter Chronic care management coordinator will attempt outreach in 3 Months.   Peter Garter RN, Jackquline Denmark, CDE Chronic Care Management Coordinator [place] [facility] [phone]

## 2020-01-02 DIAGNOSIS — Z20828 Contact with and (suspected) exposure to other viral communicable diseases: Secondary | ICD-10-CM | POA: Diagnosis not present

## 2020-01-02 DIAGNOSIS — E559 Vitamin D deficiency, unspecified: Secondary | ICD-10-CM | POA: Diagnosis not present

## 2020-01-02 DIAGNOSIS — E7800 Pure hypercholesterolemia, unspecified: Secondary | ICD-10-CM | POA: Diagnosis not present

## 2020-01-02 DIAGNOSIS — R5383 Other fatigue: Secondary | ICD-10-CM | POA: Diagnosis not present

## 2020-01-02 DIAGNOSIS — E1165 Type 2 diabetes mellitus with hyperglycemia: Secondary | ICD-10-CM | POA: Diagnosis not present

## 2020-01-02 DIAGNOSIS — G629 Polyneuropathy, unspecified: Secondary | ICD-10-CM | POA: Diagnosis not present

## 2020-01-02 DIAGNOSIS — I10 Essential (primary) hypertension: Secondary | ICD-10-CM | POA: Diagnosis not present

## 2020-01-02 DIAGNOSIS — M129 Arthropathy, unspecified: Secondary | ICD-10-CM | POA: Diagnosis not present

## 2020-01-02 DIAGNOSIS — Z79899 Other long term (current) drug therapy: Secondary | ICD-10-CM | POA: Diagnosis not present

## 2020-01-09 ENCOUNTER — Other Ambulatory Visit: Payer: Self-pay | Admitting: Nurse Practitioner

## 2020-01-09 DIAGNOSIS — Z1231 Encounter for screening mammogram for malignant neoplasm of breast: Secondary | ICD-10-CM

## 2020-01-11 DIAGNOSIS — M79605 Pain in left leg: Secondary | ICD-10-CM | POA: Diagnosis not present

## 2020-01-11 DIAGNOSIS — M79604 Pain in right leg: Secondary | ICD-10-CM | POA: Diagnosis not present

## 2020-01-16 DIAGNOSIS — I10 Essential (primary) hypertension: Secondary | ICD-10-CM | POA: Diagnosis not present

## 2020-01-16 DIAGNOSIS — E7800 Pure hypercholesterolemia, unspecified: Secondary | ICD-10-CM | POA: Diagnosis not present

## 2020-01-16 DIAGNOSIS — E559 Vitamin D deficiency, unspecified: Secondary | ICD-10-CM | POA: Diagnosis not present

## 2020-01-16 DIAGNOSIS — R7303 Prediabetes: Secondary | ICD-10-CM | POA: Diagnosis not present

## 2020-02-05 ENCOUNTER — Other Ambulatory Visit: Payer: Self-pay

## 2020-02-05 NOTE — Patient Outreach (Signed)
  [facility]) Care Management Chronic Special Needs Program  02/05/2020  Name: Yvonne [name] DOB: September 07, 1946  MRN: [id number]  Yvonne [name] is enrolled in a chronic special needs plan for Diabetes. Client called with no answer No answer and HIPAA compliant message left. 1st attempt Plan for 2nd outreach call in one week Chronic care management coordinator will attempt outreach in one week.   Peter Garter RN, Jackquline Denmark, CDE Chronic Care Management Coordinator [place] [facility] [phone]

## 2020-02-12 ENCOUNTER — Other Ambulatory Visit: Payer: Self-pay

## 2020-02-12 NOTE — Patient Outreach (Signed)
  [facility]) Care Management Chronic Special Needs Program  02/12/2020  Name: RISSA FRALEIGH DOB: 09-Jan-1946  MRN: [id number]  Ms. Yaritzi Hammer is enrolled in a chronic special needs plan for Diabetes. Client called with no answer No answer and HIPAA compliant message left. 2nd attempt Plan for 3rd outreach call in one week Chronic care management coordinator will attempt outreach in one week.   Peter Garter RN, Jackquline Denmark, CDE Chronic Care Management Coordinator [place] [facility] [phone]

## 2020-02-16 ENCOUNTER — Ambulatory Visit: Payer: HMO

## 2020-02-16 DIAGNOSIS — Z79899 Other long term (current) drug therapy: Secondary | ICD-10-CM | POA: Diagnosis not present

## 2020-02-16 DIAGNOSIS — E559 Vitamin D deficiency, unspecified: Secondary | ICD-10-CM | POA: Diagnosis not present

## 2020-02-16 DIAGNOSIS — E7800 Pure hypercholesterolemia, unspecified: Secondary | ICD-10-CM | POA: Diagnosis not present

## 2020-02-16 DIAGNOSIS — I10 Essential (primary) hypertension: Secondary | ICD-10-CM | POA: Diagnosis not present

## 2020-02-16 DIAGNOSIS — R7303 Prediabetes: Secondary | ICD-10-CM | POA: Diagnosis not present

## 2020-02-19 ENCOUNTER — Other Ambulatory Visit: Payer: Self-pay

## 2020-02-19 NOTE — Patient Outreach (Signed)
  [facility]) Care Management Chronic Special Needs Program  02/19/2020  Name: Yvonne [name] DOB: July 25, 1946  MRN: [id number]  Yvonne [name] is enrolled in a chronic special needs plan for Diabetes. Client called with no answer No answer and HIPAA compliant message left. 3 attempt  Client has not completed a Health Risk assessment and has not responded to 3 recent outreach attempts from [facility] The client's individualized care plan was developed based on available data  Goals Addressed            This Visit's Progress   . Client understands the importance of follow-up with providers by attending scheduled visits       Plan to keep scheduled appointments with providers    . Client will verbalize knowledge of self management of Hypertension as evidences by BP reading of 140/90 or less; or as defined by provider       Plan to check B/P regularly Take B/P medications as ordered Plan to follow a low salt diet  Increase activity as tolerated EMMI: High Blood Pressure(Hypertension) What you can do    . HEMOGLOBIN A1C < 7.0       Diabetes self management actions:  Glucose monitoring per provider recommendations  Eat Healthy  Check feet daily  Visit provider every 3-6 months as directed  Hbg A1C level every 3-6 months.  Eye Exam yearly    . Maintain timely refills of diabetic medication as prescribed within the year .       It is important to get your medications filled on time.    . Obtain annual  Lipid Profile, LDL-C       The goal for LDL is less than 70 mg/dL as you are at high risk for complications Try to avoid saturated fats, trans-fats and eat more fiber Plan to take statin as ordered    . Obtain Annual Eye (retinal)  Exam        Plan to have a dilated eye exam every year    . Obtain Annual Foot Exam       Check your skin and feet every day for cuts, bruises, redness, blisters, or sores. Schedule a foot exam with your health care provider  once every year    . Obtain annual screen for micro albuminuria (urine) , nephropathy (kidney problems)       It is important for your doctor to check your urine for protein at least every year     . Obtain Hemoglobin A1C at least 2 times per year       It is important to have your Hemoglobin A1C checked every 6 months if you are at goal and every 3 months if you are not at goal    . Visit Primary Care Provider or Endocrinologist at least 2 times per year        Please schedule your annual wellness visit       Plan to send unsuccessful outreach letter with copy of individualized care plan to client Plan to send Interdisciplinary care plan to provider Educational material: EMMI EMMI-High Blood Pressure: What you can do Chronic care management coordinator will attempt outreach in 3 Months.    Peter Garter RN, Jackquline Denmark, CDE Chronic Care Management Coordinator [place] [facility] [phone]

## 2020-03-05 ENCOUNTER — Ambulatory Visit
Admission: RE | Admit: 2020-03-05 | Discharge: 2020-03-05 | Disposition: A | Discharge status: Home / Self Care | Payer: HMO | Source: Ambulatory Visit | Attending: Nurse Practitioner | Admitting: Nurse Practitioner

## 2020-03-05 ENCOUNTER — Other Ambulatory Visit: Payer: Self-pay

## 2020-03-05 DIAGNOSIS — Z1231 Encounter for screening mammogram for malignant neoplasm of breast: Secondary | ICD-10-CM

## 2020-04-11 DIAGNOSIS — Z1159 Encounter for screening for other viral diseases: Secondary | ICD-10-CM | POA: Diagnosis not present

## 2020-04-11 DIAGNOSIS — I82431 Acute embolism and thrombosis of right popliteal vein: Secondary | ICD-10-CM | POA: Diagnosis not present

## 2020-04-11 DIAGNOSIS — R7303 Prediabetes: Secondary | ICD-10-CM | POA: Diagnosis not present

## 2020-04-11 DIAGNOSIS — Z79899 Other long term (current) drug therapy: Secondary | ICD-10-CM | POA: Diagnosis not present

## 2020-04-11 DIAGNOSIS — I82409 Acute embolism and thrombosis of unspecified deep veins of unspecified lower extremity: Secondary | ICD-10-CM | POA: Diagnosis not present

## 2020-04-11 DIAGNOSIS — Z7901 Long term (current) use of anticoagulants: Secondary | ICD-10-CM | POA: Diagnosis not present

## 2020-04-11 DIAGNOSIS — R310 Gross hematuria: Secondary | ICD-10-CM | POA: Diagnosis not present

## 2020-04-15 DIAGNOSIS — E7800 Pure hypercholesterolemia, unspecified: Secondary | ICD-10-CM | POA: Diagnosis not present

## 2020-04-15 DIAGNOSIS — R5383 Other fatigue: Secondary | ICD-10-CM | POA: Diagnosis not present

## 2020-04-15 DIAGNOSIS — R7303 Prediabetes: Secondary | ICD-10-CM | POA: Diagnosis not present

## 2020-04-15 DIAGNOSIS — Z79899 Other long term (current) drug therapy: Secondary | ICD-10-CM | POA: Diagnosis not present

## 2020-04-15 DIAGNOSIS — I10 Essential (primary) hypertension: Secondary | ICD-10-CM | POA: Diagnosis not present

## 2020-04-15 DIAGNOSIS — Z7901 Long term (current) use of anticoagulants: Secondary | ICD-10-CM | POA: Diagnosis not present

## 2020-04-15 DIAGNOSIS — M129 Arthropathy, unspecified: Secondary | ICD-10-CM | POA: Diagnosis not present

## 2020-04-15 DIAGNOSIS — E559 Vitamin D deficiency, unspecified: Secondary | ICD-10-CM | POA: Diagnosis not present

## 2020-05-21 ENCOUNTER — Other Ambulatory Visit: Payer: Self-pay

## 2020-05-21 NOTE — Patient Outreach (Signed)
  [facility]) Care Management Chronic Special Needs Program  05/21/2020  Name: Yvonne [name] DOB: 06/02/1946  MRN: [id number]  Ms. [name] is enrolled in a chronic special needs plan for Diabetes. Client called with no answer No answer and HIPAA compliant message left. 1st attempt Plan for 2nd outreach call in one week Chronic care management coordinator will attempt outreach in one week.   Peter Garter RN, Jackquline Denmark, CDE Chronic Care Management Coordinator [place] [facility] [phone]

## 2020-05-27 ENCOUNTER — Other Ambulatory Visit: Payer: Self-pay

## 2020-05-27 NOTE — Patient Outreach (Signed)
  [facility]) Care Management Chronic Special Needs Program  05/27/2020  Name: JETAUN COLBATH DOB: 1946-03-30  MRN: [id number]  Ms. Laurey Salser is enrolled in a chronic special needs plan for Diabetes. Client called with no answer No answer and HIPAA compliant message left.2nd attempt Plan for 3rd outreach call in one week Chronic care management coordinator will attempt outreach in one week.   Peter Garter RN, Jackquline Denmark, CDE Chronic Care Management Coordinator [place] [facility] [phone]

## 2020-06-03 ENCOUNTER — Other Ambulatory Visit: Payer: Self-pay

## 2020-06-03 NOTE — Patient Outreach (Signed)
[facility]) Care Management Chronic Special Needs Program  06/03/2020  Name: Yvonne [name] DOB: [birth year]  MRN: [id number]  Yvonne [name] is enrolled in a chronic special needs plan for  Diabetes.  Client called with no answer and HIPAA compliant message left.  3rd attempt A completed 2021 health risk assessment has  been received from the client  The client's individualized care plan was developed based on available data and 2021 Health Risk Assessment  Goals Addressed            This Visit's Progress   .  Acknowledge receipt of Advanced Directive package       Please review Advanced Directives  packet and plan to discuss with RN as needed Sent EMMI: Advanced Directives      . Client understands the importance of follow-up with providers by attending scheduled visits   On track    Plan to keep scheduled appointments with providers    . Client will verbalize knowledge of self management of Hypertension as evidences by BP reading of 140/90 or less; or as defined by provider   On track    Plan to check B/P regularly Take B/P medications as ordered Plan to follow a low salt diet  Increase activity as tolerated     . HEMOGLOBIN A1C < 7       No recent Hemoglobin A1C results available 80-130 fasting and 180 or less after meals Plan to follow a low carbohydrate, low salt diet, watch portion sizes and avoid sugar sweetened drinks Plan to exercise 150 minutes a week including two sessions of resistance exercise weekly    . Maintain timely refills of diabetic medication as prescribed within the year .   On track    Maintaining timely refills of medications per dispense report It is important to get your medications filled on time.    . Obtain annual  Lipid Profile, LDL-C   On track    The goal for LDL is less than 70 mg/dL as you are at high risk for complications Try to avoid saturated fats, trans-fats and eat more fiber Plan to take statin as  ordered    . Obtain Annual Eye (retinal)  Exam    On track    Plan to have a dilated eye exam every year    . Obtain Annual Foot Exam   On track    Check your skin and feet every day for cuts, bruises, redness, blisters, or sores. Schedule a foot exam with your health care provider once every year    . Obtain annual screen for micro albuminuria (urine) , nephropathy (kidney problems)   On track    It is important for your doctor to check your urine for protein at least every year     . Obtain Hemoglobin A1C at least 2 times per year   On track    It is important to have your Hemoglobin A1C checked every 6 months if you are at goal and every 3 months if you are not at goal    . Visit Primary Care Provider or Endocrinologist at least 2 times per year    On track    Primary care provider 02/16/20 Please schedule your annual wellness visit        Plan:  . Send unsuccessful outreach letter with a copy of individualized care plan to client . Send individualized care plan to provider . Educational Materials-Advanced Directives   Chronic care Nurse, children's  will attempt outreach in 12 months per tier level.  Peter Garter RN, Jackquline Denmark, CDE Chronic Care Management Coordinator [place] [facility] [phone]

## 2020-07-15 DIAGNOSIS — Z20822 Contact with and (suspected) exposure to covid-19: Secondary | ICD-10-CM | POA: Diagnosis not present

## 2020-07-15 DIAGNOSIS — I10 Essential (primary) hypertension: Secondary | ICD-10-CM | POA: Diagnosis not present

## 2020-07-15 DIAGNOSIS — E559 Vitamin D deficiency, unspecified: Secondary | ICD-10-CM | POA: Diagnosis not present

## 2020-07-15 DIAGNOSIS — E7800 Pure hypercholesterolemia, unspecified: Secondary | ICD-10-CM | POA: Diagnosis not present

## 2020-07-15 DIAGNOSIS — E1165 Type 2 diabetes mellitus with hyperglycemia: Secondary | ICD-10-CM | POA: Diagnosis not present

## 2020-07-15 DIAGNOSIS — R5383 Other fatigue: Secondary | ICD-10-CM | POA: Diagnosis not present

## 2020-07-15 DIAGNOSIS — M129 Arthropathy, unspecified: Secondary | ICD-10-CM | POA: Diagnosis not present

## 2020-07-15 DIAGNOSIS — G629 Polyneuropathy, unspecified: Secondary | ICD-10-CM | POA: Diagnosis not present

## 2020-07-22 ENCOUNTER — Other Ambulatory Visit: Payer: Self-pay | Admitting: Oncology

## 2020-07-23 ENCOUNTER — Telehealth: Payer: Self-pay | Admitting: Oncology

## 2020-07-23 NOTE — Telephone Encounter (Signed)
Received a hem referral from [facility] for acute dvt. Yvonne [name] has been scheduled to see Dr. Jana Hakim on 9/23 at 4pm w/labs on 9/21 at 1pm. Letter mailed.

## 2020-07-29 DIAGNOSIS — E559 Vitamin D deficiency, unspecified: Secondary | ICD-10-CM | POA: Diagnosis not present

## 2020-07-29 DIAGNOSIS — E7800 Pure hypercholesterolemia, unspecified: Secondary | ICD-10-CM | POA: Diagnosis not present

## 2020-07-29 DIAGNOSIS — Z9189 Other specified personal risk factors, not elsewhere classified: Secondary | ICD-10-CM | POA: Diagnosis not present

## 2020-07-29 DIAGNOSIS — E1165 Type 2 diabetes mellitus with hyperglycemia: Secondary | ICD-10-CM | POA: Diagnosis not present

## 2020-07-30 ENCOUNTER — Telehealth: Payer: Self-pay | Admitting: Oncology

## 2020-07-30 NOTE — Telephone Encounter (Signed)
Pt cld and confirmed appts for lab on 9/21 and to see Dr. Jana Hakim on 9/23

## 2020-08-19 DIAGNOSIS — I82431 Acute embolism and thrombosis of right popliteal vein: Secondary | ICD-10-CM | POA: Diagnosis not present

## 2020-09-09 ENCOUNTER — Other Ambulatory Visit: Payer: Self-pay | Admitting: *Deleted

## 2020-09-09 DIAGNOSIS — C50411 Malignant neoplasm of upper-outer quadrant of right female breast: Secondary | ICD-10-CM

## 2020-09-10 ENCOUNTER — Inpatient Hospital Stay: Payer: HMO | Attending: Oncology

## 2020-09-10 ENCOUNTER — Other Ambulatory Visit: Payer: Self-pay

## 2020-09-10 DIAGNOSIS — Z853 Personal history of malignant neoplasm of breast: Secondary | ICD-10-CM | POA: Insufficient documentation

## 2020-09-10 DIAGNOSIS — Z87891 Personal history of nicotine dependence: Secondary | ICD-10-CM | POA: Insufficient documentation

## 2020-09-10 DIAGNOSIS — C50411 Malignant neoplasm of upper-outer quadrant of right female breast: Secondary | ICD-10-CM

## 2020-09-10 DIAGNOSIS — I82431 Acute embolism and thrombosis of right popliteal vein: Secondary | ICD-10-CM | POA: Diagnosis not present

## 2020-09-10 DIAGNOSIS — Z800 Family history of malignant neoplasm of digestive organs: Secondary | ICD-10-CM | POA: Diagnosis not present

## 2020-09-10 DIAGNOSIS — Z9221 Personal history of antineoplastic chemotherapy: Secondary | ICD-10-CM | POA: Diagnosis not present

## 2020-09-10 DIAGNOSIS — M549 Dorsalgia, unspecified: Secondary | ICD-10-CM | POA: Diagnosis not present

## 2020-09-10 DIAGNOSIS — Z9011 Acquired absence of right breast and nipple: Secondary | ICD-10-CM | POA: Insufficient documentation

## 2020-09-10 DIAGNOSIS — Z806 Family history of leukemia: Secondary | ICD-10-CM | POA: Insufficient documentation

## 2020-09-10 DIAGNOSIS — Z7901 Long term (current) use of anticoagulants: Secondary | ICD-10-CM | POA: Insufficient documentation

## 2020-09-10 DIAGNOSIS — Z8041 Family history of malignant neoplasm of ovary: Secondary | ICD-10-CM | POA: Insufficient documentation

## 2020-09-10 LAB — CBC WITH DIFFERENTIAL (CANCER CENTER ONLY)
Abs Immature Granulocytes: 0.01 10*3/uL (ref 0.00–0.07)
Basophils Absolute: 0.1 10*3/uL (ref 0.0–0.1)
Basophils Relative: 1 %
Eosinophils Absolute: 0.2 10*3/uL (ref 0.0–0.5)
Eosinophils Relative: 2 %
HCT: 39.6 % (ref 36.0–46.0)
Hemoglobin: 13.1 g/dL (ref 12.0–15.0)
Immature Granulocytes: 0 %
Lymphocytes Relative: 32 %
Lymphs Abs: 2.5 10*3/uL (ref 0.7–4.0)
MCH: 29.1 pg (ref 26.0–34.0)
MCHC: 33.1 g/dL (ref 30.0–36.0)
MCV: 88 fL (ref 80.0–100.0)
Monocytes Absolute: 0.8 10*3/uL (ref 0.1–1.0)
Monocytes Relative: 9 %
Neutro Abs: 4.5 10*3/uL (ref 1.7–7.7)
Neutrophils Relative %: 56 %
Platelet Count: 222 10*3/uL (ref 150–400)
RBC: 4.5 MIL/uL (ref 3.87–5.11)
RDW: 13.9 % (ref 11.5–15.5)
WBC Count: 8 10*3/uL (ref 4.0–10.5)
nRBC: 0 % (ref 0.0–0.2)

## 2020-09-10 LAB — CMP (CANCER CENTER ONLY)
ALT: 22 U/L (ref 0–44)
AST: 24 U/L (ref 15–41)
Albumin: 3.9 g/dL (ref 3.5–5.0)
Alkaline Phosphatase: 54 U/L (ref 38–126)
Anion gap: 8 (ref 5–15)
BUN: 18 mg/dL (ref 8–23)
CO2: 32 mmol/L (ref 22–32)
Calcium: 10.1 mg/dL (ref 8.9–10.3)
Chloride: 97 mmol/L — ABNORMAL LOW (ref 98–111)
Creatinine: 0.87 mg/dL (ref 0.44–1.00)
GFR, Est AFR Am: >60 mL/min (ref >60)
GFR, Estimated: >60 mL/min (ref >60)
Glucose, Bld: 88 mg/dL (ref 70–99)
Potassium: 3.2 mmol/L — ABNORMAL LOW (ref 3.5–5.1)
Sodium: 137 mmol/L (ref 135–145)
Total Bilirubin: 0.7 mg/dL (ref 0.3–1.2)
Total Protein: 7.6 g/dL (ref 6.5–8.1)

## 2020-09-11 NOTE — Progress Notes (Signed)
ID: Yvonne GILLOOLY   DOB: 11/10/1946  MR#: [id number]  CSN#:[id number]  Patient Care Team: Simona Huh, NP as PCP - General (Nurse Practitioner) Kyung Rudd, MD as Consulting Physician (Radiation Oncology) Tabetha Haraway, Virgie Dad, MD as Consulting Physician (Medical Oncology) Dimitri Ped, RN as [facility] OTHER MD:  CHIEF COMPLAINT: acute DVT; history of estrogen receptor positive breast cancer  CURRENT TREATMENT: xarelto    INTERVAL HISTORY: Yvonne [name] returns today for evaluation of her new acute DVT. I last saw her in 10/2017 when she "graduated" from follow up of her breast cancer.  She presented to her PCP with bilateral lower extremity pain and swelling. She underwent venous ultrasound on 01/11/2020 revealing deep vein thrombosis in right popliteal vein.  She was started on Eliquis and then switched to Xarelto.April 2021, dose reduced to 10 mg secondary to gross hematuria.    She had a prior DVT during pregnancy, apparently with pulmonary embolism at that time.  This is per her recollection.  We do not have any records  Yvonne [name] is not aware of any events or change that might have provoked the right lower extremity DVT.  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  She has been told that she cannot receive the Covid vaccine.  She has been wearing an N95 mask at work since October 2020.  Her husband also has not been vaccinated.  She has some back pain and that makes her walk with a slight limp.  She has had no bleeding or bruising complications since switched to the lower dose of rivaroxaban.  A detailed review of systems today was otherwise stable  BREAST CANCER HISTORY: From the original intake note:  The patient underwent screening mammography 03/02/2012. (Prior mammogram was 2005). Possible mass was  noted in the right breast and she was recalled for additional views 03/25/2012. These showed a dense spiculated mass with overlying skin retraction in the upper outer quadrant of the right breast. There were associated coarse linear calcifications. On physical exam there was an area of dimpling in the upper outer quadrant of the right breast and a 5 cm mass was palpable. By ultrasound, this was irregular, hypoechoic, and measured 3.3 cm. In addition, an abnormal lymph node in the right axilla with the thickened cortex measured 1.8 cm.  In the opposite breast, Dr. Valinda Party found an irregular hypoechoic mass with lobulated margins measuring 2.3 cm. The left axilla was unremarkable. Bilateral breast MRI was obtained 04/01/2012, and showed the mass in question in the right breast to measure 3.8 cm maximally, with overlying skin retraction. In addition, there was a second enhancing bilobed mass in the lower outer quadrant of the right breast, measuring 1 point centimeter. This was felt to possibly represent an intramammary lymph node.  Biopsy of the right breast and right axillary lymph node 03/25/2012 both showed ([id number])  invasive ductal carcinoma, grade 2, estrogen and progesterone receptor positive, both at 100%, with an MIB-1 39%, and no HER-2 amplification. 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 After much discussion the patient decided on right modified radical mastectomy and left lumpectomy.   Her subsequent history is as detailed below.   PAST MEDICAL HISTORY: Past Medical History:  Diagnosis Date  . Arthritis   . Breast cancer (  [place]) 04/2012   right breast  . Bruises easily   . Cancer ([place]) 03/25/12   right breast  . Chronic back pain    arthritis  . Elevated cholesterol    takes Crestor daily  . Headache(784.0)   . Hemorrhoids   . History of chemotherapy    x 1, pt unable to tolerate, on  PO chemotherpay  . Hypertension    takes Maxzide daily   . Phlebitis 68yr ago   hx of-  . Seasonal allergies    takes Zyrtec daily and Nasal Spray prn    PAST SURGICAL HISTORY: Past Surgical History:  Procedure Laterality Date  . APPENDECTOMY  129yrago  . BREAST EXCISIONAL BIOPSY Left   . BREAST SURGERY  04/27/12   Right mod mastectomy, ER/PR +, HER2 -  . BREAST SURGERY  04/27/12   Left Breast NL lumpectomy-no malignancy  . COLONOSCOPY    . MASTECTOMY Right 2013  . PORT-A-CATH REMOVAL  11/24/2012   Procedure: REMOVAL PORT-A-CATH;  Surgeon: [name];  [facility] [facility] Service: General;  Laterality: Left;  . PORTACATH PLACEMENT  04/26/2012   Procedure: INSERTION PORT-A-CATH;  Surgeon: [name];  [facility] Service: General;  Laterality: Left;  Started at 1746.  . TUBAL LIGATION  89yrgo    FAMILY HISTORY Family History  Problem Relation Age of Onset  . Cancer Mother        ovarian cancer  . Leukemia Father   . Cancer Father        leukemia  . Cancer Sister 57 47    colon cancer  . Cancer Brother 60 9    colon cancer  . Cancer Brother        prostate cancer, deceased  . Anesthesia problems Neg Hx   . Hypotension Neg Hx   . Malignant hyperthermia Neg Hx   . Pseudochol deficiency Neg Hx   the patient's father died at the age of 92,59rom "leukemia." the patient's mother died at the age of 53,56or what may have been uterine or ovarian cancer. The patient had 4 brothers and 2 sisters one sister has a history of colon cancer diagnosed at age 36 75e brother has a history of colon cancer diagnosed at age 75 63second brother died from complications of prostate cancer. There is no other breast cancer in the family to her knowledge.   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 She is married to Yvonne [facility] LLCho is disabled secondary to COPD. JudLeorad 5 children from a prior marriage. They are KimSuzi Roots8, who was also present at the [facility], and works for [employer], who is employed in conArchitectamHavery Moros who works as a houSecretary/administratornd TamProduction manager 27o works as a CNAQuarry managerr GuiOffice Depothe fifth child died at age 30.42he patient has 13 grandchildren and 9 great grandchildren. She is not a chuAmbulance person  ADVANCED DIRECTIVES: In the absence of any documents to the contrary the patient's husband is her healthcare power of attorney   HEALTH MAINTENANCE:  Social History   Tobacco Use  .  Smoking status: Former Smoker    Quit date: 01/10/1984    Years since quitting: 36.6  . Smokeless tobacco: Never Used  . Tobacco comment: quit 30+yrs ago  Substance Use Topics  . Alcohol use: No  . Drug use: No     Colonoscopy: 2016, repeat due 2021  PAP: 2017, negative  Bone density: 10/2016, -1.9   Allergies  Allergen Reactions  . Penicillins Anaphylaxis    Current Outpatient Medications  Medication Sig Dispense Refill  . diclofenac Sodium (VOLTAREN) 1 % GEL diclofenac 1 % topical gel  APP 1 APPLICATION AA BID PRN    . gabapentin (NEURONTIN) 300 MG capsule Take 900 mg by mouth at bedtime.    Marland Kitchen HYDROcodone-acetaminophen (NORCO/VICODIN) 5-325 MG tablet Take 1 tablet by mouth 3 (three) times daily as needed.    . Lancets (FREESTYLE) lancets daily.    . meloxicam (MOBIC) 15 MG tablet meloxicam 15 mg tablet    . metFORMIN (GLUCOPHAGE) 500 MG tablet metformin 500 mg tablet    . rosuvastatin (CRESTOR) 20 MG tablet rosuvastatin 20 mg tablet    . temazepam (RESTORIL) 15 MG capsule Take 15 mg by mouth at bedtime as needed.    . triamterene-hydrochlorothiazide (MAXZIDE-25) 37.5-25 MG  tablet triamterene 37.5 mg-hydrochlorothiazide 25 mg tablet  TK 1 T PO D    . Vitamin D, Ergocalciferol, (DRISDOL) 1.25 MG (50000 UNIT) CAPS capsule Take 50,000 Units by mouth every 14 (fourteen) days.    Alveda Reasons 10 MG TABS tablet Take 10 mg by mouth daily.     No current facility-administered medications for this visit.    OBJECTIVE: White woman who appears stated age 25:   09/12/20 1528  BP: (!) 141/77  Pulse: (!) 101  Resp: 18  Temp: 97.8 F (36.6 C)  SpO2: 99%     Body mass index is 29.41 kg/m.    ECOG FS: 0 Filed Weights   09/12/20 1528  Weight: 182 lb 3.2 oz (82.6 kg)    Sclerae unicteric Wearing a mask No cervical or supraclavicular adenopathy; moderate hearing loss Lungs no rales or rhonchi Heart regular rate and rhythm Abd soft, nontender, positive bowel sounds MSK no focal spinal tenderness Neuro: nonfocal, appropriate affect Breasts: Status post right mastectomy with no evidence of chest wall recurrence.  The left breast is benign.  Both axillae are benign.  LAB RESULTS: Lab Results  Component Value Date   WBC 8.0 09/10/2020   NEUTROABS 4.5 09/10/2020   HGB 13.1 09/10/2020   HCT 39.6 09/10/2020   MCV 88.0 09/10/2020   PLT 222 09/10/2020      Chemistry      Component Value Date/Time   NA 137 09/10/2020 1226   NA 141 11/09/2017 0849   K 3.2 (L) 09/10/2020 1226   K 3.9 11/09/2017 0849   CL 97 (L) 09/10/2020 1226   CL 99 02/20/2013 0758   CO2 32 09/10/2020 1226   CO2 29 11/09/2017 0849   BUN 18 09/10/2020 1226   BUN 22.5 11/09/2017 0849   CREATININE 0.87 09/10/2020 1226   CREATININE 0.9 11/09/2017 0849      Component Value Date/Time   CALCIUM 10.1 09/10/2020 1226   CALCIUM 10.3 11/09/2017 0849   ALKPHOS 54 09/10/2020 1226   ALKPHOS 64 11/09/2017 0849   AST 24 09/10/2020 1226   AST 19 11/09/2017 0849   ALT 22 09/10/2020 1226   ALT 19 11/09/2017 0849   BILITOT 0.7 09/10/2020 1226   BILITOT 0.60 11/09/2017 0849  STUDIES: No  results found.   ASSESSMENT: 73 y.o.  [place] woman  (1) status post right modified radical mastectomy 04/26/2012 for a pT2 pN1, stage IIB invasive ductal carcinoma, grade 2, estrogen and progesterone both 100% positive, with an MIB-1 of 39%, and no HER-2 amplification.   (2) status post left lumpectomy 04/26/2012, benign  (3) adjuvant cyclophosphamide/ docetaxel given x1 (06/28/2012), the patient refusing further chemotherapy.  Declined radiation therapy.  (4) anastrozole started 07/21/2012, completing 5+ years 11/16/2017  (a) bone density at [name] 02/13/2004 at showed a T score of -1.75 (osteopenia)  (b) on calcium and vitamin D supplementation.  (c) the bone density scan 11/11/2016 showed a T score of minus 1.9   (5) right lower extremity DVT documented January 2021  (a) initially on Eliquis, with gross hematuria developing  (b) on rivaroxaban 10 mg daily since April 8850, without complications   PLAN:  Kanon is now a little over 8 years out from definitive surgery for her breast cancer.  There is no clinical evidence of disease recurrence.  She has what appears to be an unprovoked deep vein thrombus of the right lower extremity.  I think it would be prudent to obtain tumor markers.  We are also going to obtain a hypercoagulable panel to see if we can document an acquired or inherited abnormality that might be responsible for this clot.  In any case our general recommendation for unprovoked clots is for lifelong anticoagulation.  The risk of repeat clotting is otherwise unacceptably high.  The fact that she had a clot during pregnancy, which is a hyper coagulable state in itself, reinforces this suggestion.  Of course there are risks to anticoagulation as well.  She is well aware of this and she and her husband are very careful to avoid or minimize the risk of falls  I do think she will need further follow-up here.  Once I have all the results of the lab work I will  call her and I will send her physician a note but I do not anticipate a change on the above recommendation.  Total encounter time 45 minutes.*  Mell Mellott, Virgie Dad, MD  09/12/20 4:41 PM Medical Oncology and Hematology [facility] [place], [place] [address] Tel. [phone]    Fax. [phone]    I, Wilburn Mylar, am acting as scribe for Dr. Virgie Dad. Breaunna Gottlieb.  I, Lurline Del MD, have reviewed the above documentation for accuracy and completeness, and I agree with the above.    *Total Encounter Time as defined by the Centers for Medicare and Medicaid Services includes, in addition to the face-to-face time of a patient visit (documented in the note above) non-face-to-face time: obtaining and reviewing outside history, ordering and reviewing medications, tests or procedures, care coordination (communications with other health care professionals or caregivers) and documentation in the medical record.

## 2020-09-12 ENCOUNTER — Inpatient Hospital Stay (HOSPITAL_BASED_OUTPATIENT_CLINIC_OR_DEPARTMENT_OTHER): Payer: HMO | Admitting: Oncology

## 2020-09-12 ENCOUNTER — Other Ambulatory Visit: Payer: Self-pay

## 2020-09-12 VITALS — BP 141/77 | HR 101 | Temp 97.8°F | Resp 18 | Ht 66.0 in | Wt 182.2 lb

## 2020-09-12 DIAGNOSIS — I82431 Acute embolism and thrombosis of right popliteal vein: Secondary | ICD-10-CM

## 2020-09-12 DIAGNOSIS — Z170 Estrogen receptor positive status [ER+]: Secondary | ICD-10-CM

## 2020-09-12 DIAGNOSIS — D689 Coagulation defect, unspecified: Secondary | ICD-10-CM | POA: Diagnosis not present

## 2020-09-12 DIAGNOSIS — C50411 Malignant neoplasm of upper-outer quadrant of right female breast: Secondary | ICD-10-CM

## 2020-09-12 DIAGNOSIS — M8580 Other specified disorders of bone density and structure, unspecified site: Secondary | ICD-10-CM | POA: Diagnosis not present

## 2020-09-12 HISTORY — DX: Acute embolism and thrombosis of right popliteal vein: I82.431

## 2020-09-16 ENCOUNTER — Other Ambulatory Visit: Payer: Self-pay

## 2020-09-16 ENCOUNTER — Inpatient Hospital Stay: Payer: HMO

## 2020-09-16 DIAGNOSIS — D689 Coagulation defect, unspecified: Secondary | ICD-10-CM

## 2020-09-16 DIAGNOSIS — M8580 Other specified disorders of bone density and structure, unspecified site: Secondary | ICD-10-CM

## 2020-09-16 DIAGNOSIS — I82431 Acute embolism and thrombosis of right popliteal vein: Secondary | ICD-10-CM

## 2020-09-16 DIAGNOSIS — Z170 Estrogen receptor positive status [ER+]: Secondary | ICD-10-CM

## 2020-09-16 LAB — ANTITHROMBIN III: AntiThromb III Func: 103 % (ref 75–120)

## 2020-09-16 LAB — CEA (IN HOUSE-CHCC): CEA (CHCC-In House): 1.49 ng/mL (ref 0.00–5.00)

## 2020-09-17 LAB — BETA-2-GLYCOPROTEIN I ABS, IGG/M/A
Beta-2 Glyco I IgG: <9 GPI IgG units (ref 0–20)
Beta-2-Glycoprotein I IgA: <9 GPI IgA units (ref 0–25)
Beta-2-Glycoprotein I IgM: <9 GPI IgM units (ref 0–32)

## 2020-09-17 LAB — PROTEIN C ACTIVITY: Protein C Activity: 152 % (ref 73–180)

## 2020-09-17 LAB — PROTEIN S ACTIVITY: Protein S Activity: 165 % — ABNORMAL HIGH (ref 63–140)

## 2020-09-17 LAB — CARDIOLIPIN ANTIBODIES, IGG, IGM, IGA
Anticardiolipin IgA: <9 APL U/mL (ref 0–11)
Anticardiolipin IgG: <9 GPL U/mL (ref 0–14)
Anticardiolipin IgM: <9 MPL U/mL (ref 0–12)

## 2020-09-17 LAB — CANCER ANTIGEN 27.29: CA 27.29: 15.1 U/mL (ref 0.0–38.6)

## 2020-09-17 LAB — HOMOCYSTEINE: Homocysteine: 17.2 umol/L (ref 0.0–19.2)

## 2020-09-17 LAB — PROTEIN S, TOTAL: Protein S Ag, Total: 119 % (ref 60–150)

## 2020-09-18 LAB — LUPUS ANTICOAGULANT PANEL
DRVVT: 129.4 s — ABNORMAL HIGH (ref 0.0–47.0)
PTT Lupus Anticoagulant: 48.9 s (ref 0.0–51.9)

## 2020-09-18 LAB — PROTEIN C, TOTAL: Protein C, Total: 118 % (ref 60–150)

## 2020-09-18 LAB — DRVVT MIX: dRVVT Mix: 79 s — ABNORMAL HIGH (ref 0.0–40.4)

## 2020-09-18 LAB — DRVVT CONFIRM: dRVVT Confirm: 1.6 ratio — ABNORMAL HIGH (ref 0.8–1.2)

## 2020-09-19 ENCOUNTER — Encounter: Payer: Self-pay | Admitting: Oncology

## 2020-09-19 ENCOUNTER — Other Ambulatory Visit: Payer: Self-pay | Admitting: Oncology

## 2020-09-19 LAB — FACTOR 5 LEIDEN

## 2020-09-23 LAB — PROTHROMBIN GENE MUTATION

## 2020-10-15 DIAGNOSIS — Z0000 Encounter for general adult medical examination without abnormal findings: Secondary | ICD-10-CM | POA: Diagnosis not present

## 2020-10-15 DIAGNOSIS — R0602 Shortness of breath: Secondary | ICD-10-CM | POA: Diagnosis not present

## 2020-10-15 DIAGNOSIS — E1165 Type 2 diabetes mellitus with hyperglycemia: Secondary | ICD-10-CM | POA: Diagnosis not present

## 2020-10-15 DIAGNOSIS — I10 Essential (primary) hypertension: Secondary | ICD-10-CM | POA: Diagnosis not present

## 2020-10-15 DIAGNOSIS — Z1339 Encounter for screening examination for other mental health and behavioral disorders: Secondary | ICD-10-CM | POA: Diagnosis not present

## 2020-10-15 DIAGNOSIS — E559 Vitamin D deficiency, unspecified: Secondary | ICD-10-CM | POA: Diagnosis not present

## 2020-10-15 DIAGNOSIS — Z1331 Encounter for screening for depression: Secondary | ICD-10-CM | POA: Diagnosis not present

## 2020-10-15 DIAGNOSIS — E7800 Pure hypercholesterolemia, unspecified: Secondary | ICD-10-CM | POA: Diagnosis not present

## 2020-10-28 ENCOUNTER — Other Ambulatory Visit: Payer: Self-pay

## 2020-10-28 NOTE — Patient Outreach (Signed)
  [facility]) Care Management Chronic Special Needs Program    10/28/2020  Name: Yvonne [name], DOB: 08-23-1946  MRN: [id number]   Ms. Yvonne [name] is enrolled in a chronic special needs plan for Diabetes.  [place] Management will continue to provide services for this client through 12/20/2020. [facility] care management team will assume care 12/21/2020.  Peter Garter RN, Jackquline Denmark, CDE Chronic Care Management Coordinator [facility] Care Management [phone]

## 2020-12-26 ENCOUNTER — Other Ambulatory Visit: Payer: Self-pay

## 2021-03-13 ENCOUNTER — Other Ambulatory Visit: Payer: Self-pay | Admitting: Nurse Practitioner

## 2021-03-13 DIAGNOSIS — Z1231 Encounter for screening mammogram for malignant neoplasm of breast: Secondary | ICD-10-CM

## 2021-05-05 ENCOUNTER — Ambulatory Visit: Payer: HMO

## 2021-05-06 ENCOUNTER — Ambulatory Visit
Admission: RE | Admit: 2021-05-06 | Discharge: 2021-05-06 | Disposition: A | Discharge status: Home / Self Care | Payer: Medicare HMO | Source: Ambulatory Visit | Attending: Nurse Practitioner | Admitting: Nurse Practitioner

## 2021-05-06 ENCOUNTER — Other Ambulatory Visit: Payer: Self-pay

## 2021-05-06 DIAGNOSIS — Z1231 Encounter for screening mammogram for malignant neoplasm of breast: Secondary | ICD-10-CM

## 2022-02-18 IMAGING — MG DIGITAL SCREENING UNILAT LEFT W/ TOMO W/ CAD
4 series · 4 of 12 positions shown · non-contrast
Comparison: Previous exam(s).

CLINICAL DATA: Screening.

EXAM:
DIGITAL SCREENING UNILATERAL LEFT MAMMOGRAM WITH CAD AND TOMO

[L MLO synth-2D]
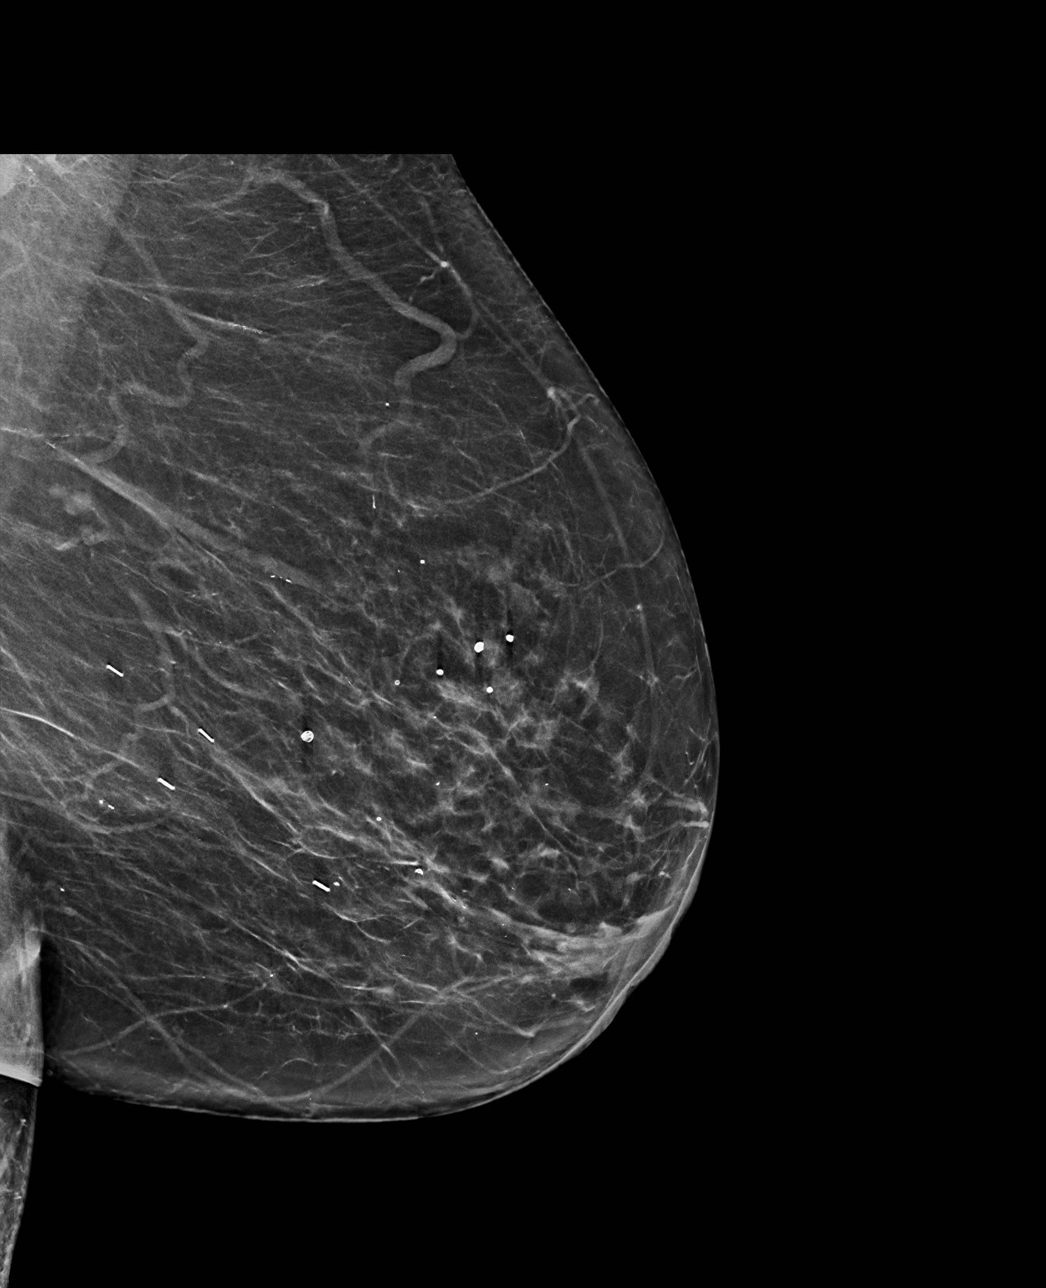

[L CC synth-2D]
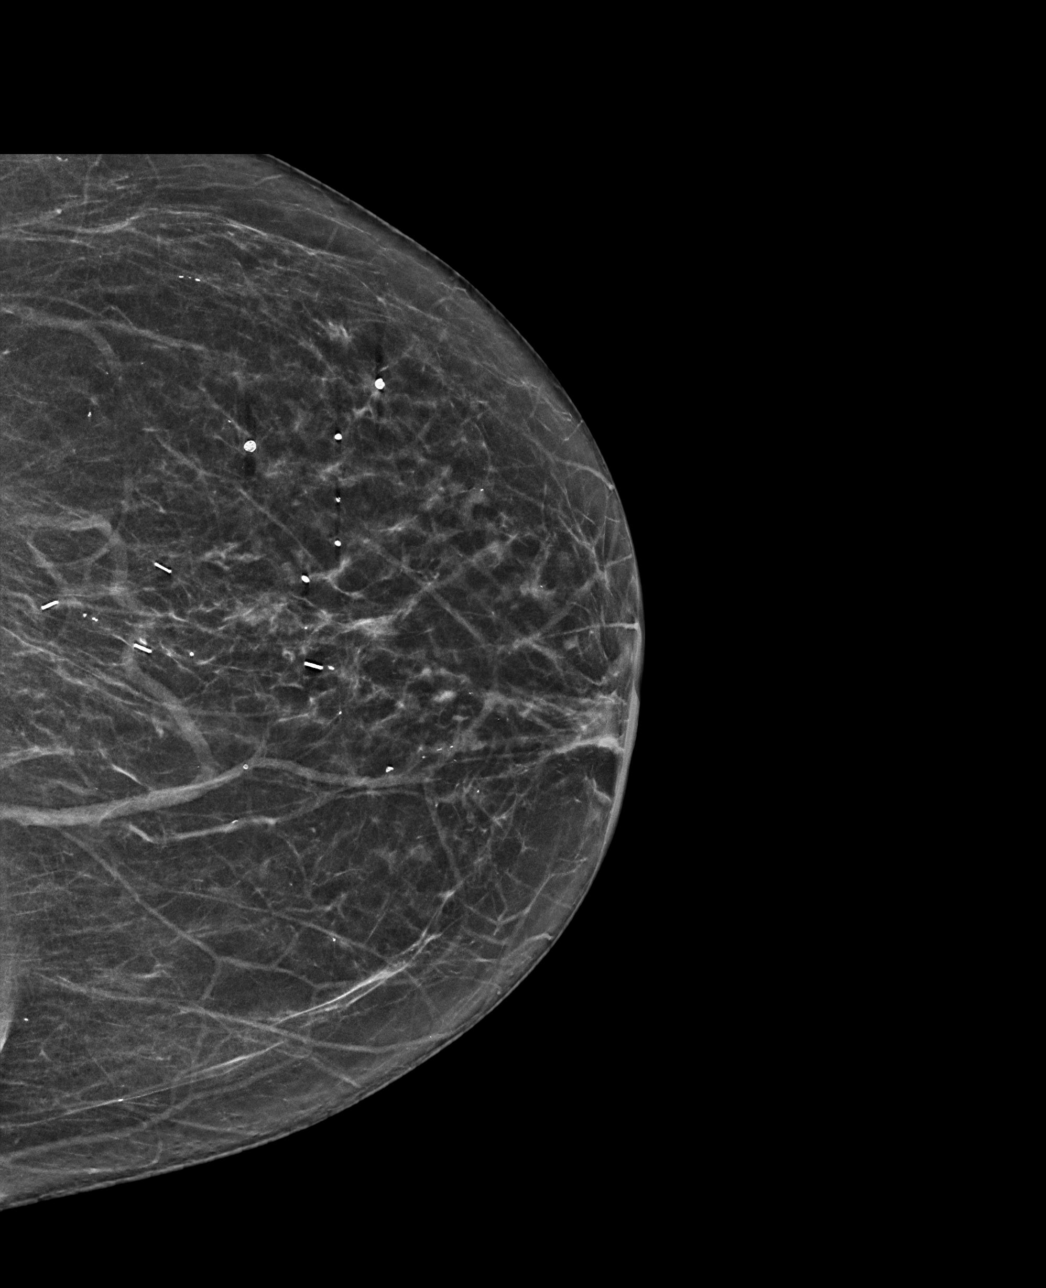

[L MLO tomo · tomo slice 35/70.0]
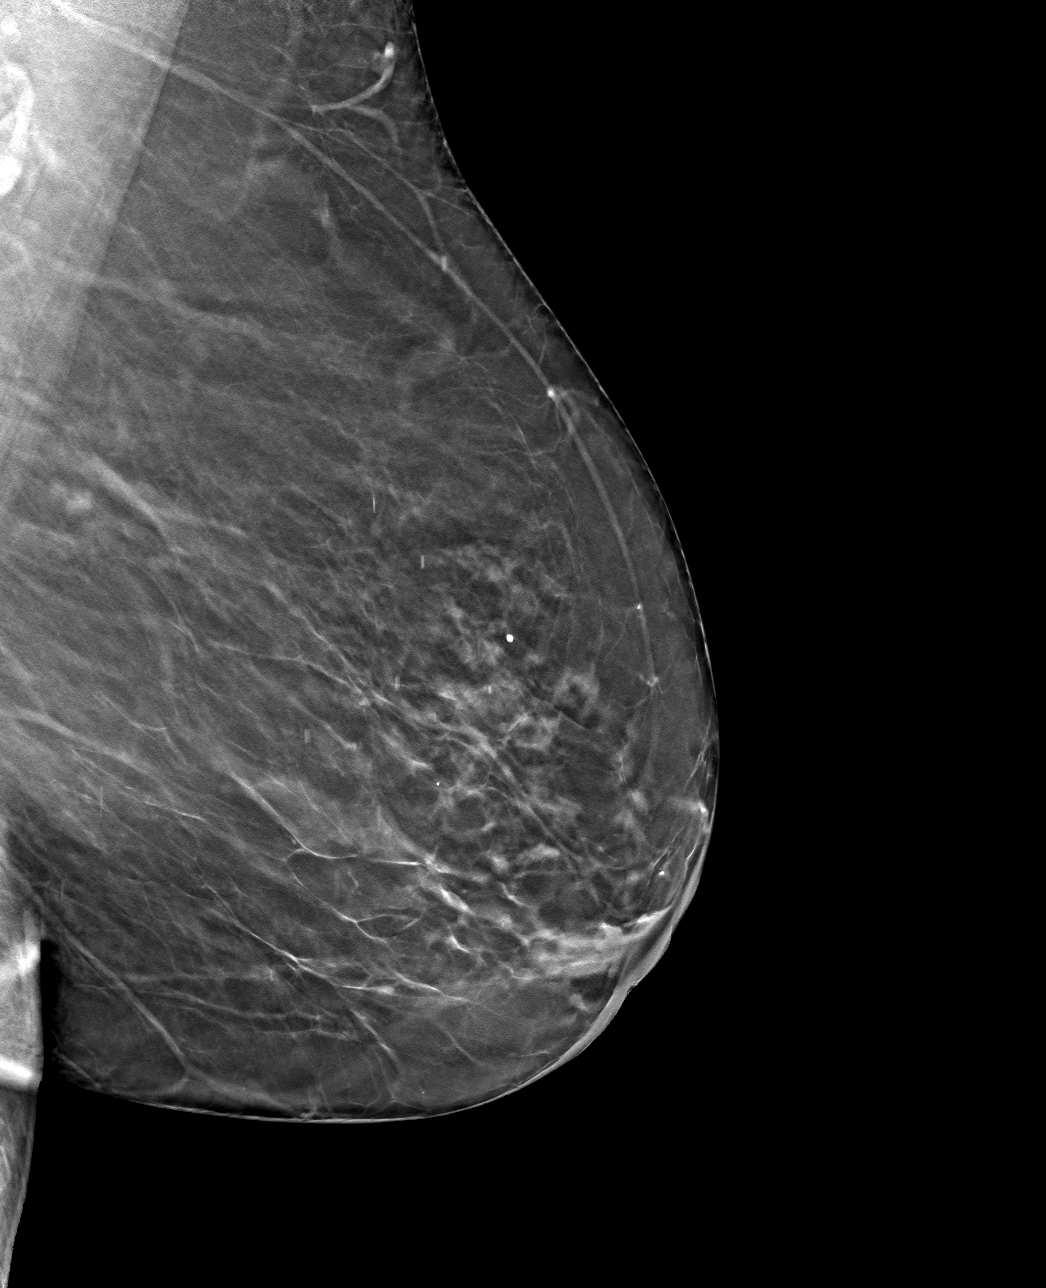

[L CC tomo · tomo slice 29/58.0]
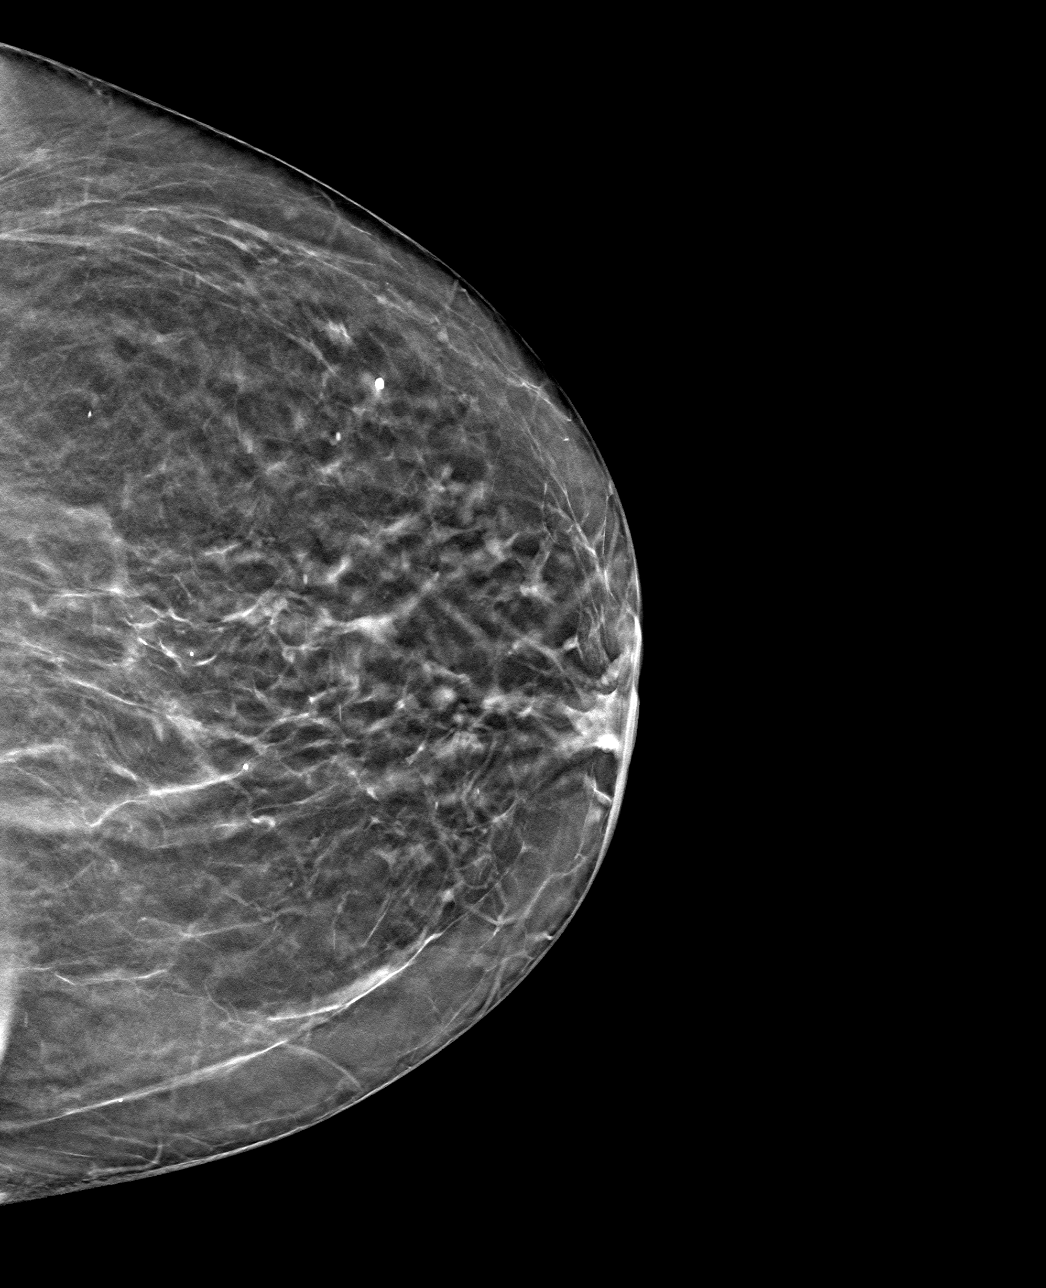

[4 of 12 positions shown; findings below may reference images not displayed]

ACR Breast Density Category b: There are scattered areas of
fibroglandular density.
FINDINGS: There are no findings suspicious for malignancy. Images were
processed with CAD.
IMPRESSION: No mammographic evidence of malignancy. A result letter of this
screening mammogram will be mailed directly to the patient.

RECOMMENDATION:
Screening mammogram in one year. (Code:51-Y-CJA)

BI-RADS CATEGORY  1: Negative.

## 2022-04-08 ENCOUNTER — Other Ambulatory Visit: Payer: Self-pay | Admitting: Nurse Practitioner

## 2022-04-08 DIAGNOSIS — Z1231 Encounter for screening mammogram for malignant neoplasm of breast: Secondary | ICD-10-CM

## 2022-05-20 ENCOUNTER — Ambulatory Visit
Admission: RE | Admit: 2022-05-20 | Discharge: 2022-05-20 | Disposition: A | Discharge status: Home / Self Care | Payer: Medicare HMO | Source: Ambulatory Visit | Attending: Nurse Practitioner | Admitting: Nurse Practitioner

## 2022-05-20 DIAGNOSIS — Z1231 Encounter for screening mammogram for malignant neoplasm of breast: Secondary | ICD-10-CM

## 2022-11-30 ENCOUNTER — Inpatient Hospital Stay (HOSPITAL_COMMUNITY)
Admission: EM | Admit: 2022-11-30 | Discharge: 2022-12-04 | DRG: 690 | Disposition: A | Discharge status: Home / Self Care | Payer: Medicare HMO | Attending: Internal Medicine | Admitting: Internal Medicine

## 2022-11-30 ENCOUNTER — Encounter (HOSPITAL_COMMUNITY): Payer: Self-pay

## 2022-11-30 ENCOUNTER — Other Ambulatory Visit: Payer: Self-pay

## 2022-11-30 ENCOUNTER — Emergency Department (HOSPITAL_COMMUNITY): Payer: Medicare HMO

## 2022-11-30 DIAGNOSIS — Z806 Family history of leukemia: Secondary | ICD-10-CM

## 2022-11-30 DIAGNOSIS — Z853 Personal history of malignant neoplasm of breast: Secondary | ICD-10-CM

## 2022-11-30 DIAGNOSIS — E1129 Type 2 diabetes mellitus with other diabetic kidney complication: Secondary | ICD-10-CM | POA: Diagnosis present

## 2022-11-30 DIAGNOSIS — I2510 Atherosclerotic heart disease of native coronary artery without angina pectoris: Secondary | ICD-10-CM | POA: Diagnosis present

## 2022-11-30 DIAGNOSIS — Z170 Estrogen receptor positive status [ER+]: Secondary | ICD-10-CM

## 2022-11-30 DIAGNOSIS — C50411 Malignant neoplasm of upper-outer quadrant of right female breast: Secondary | ICD-10-CM

## 2022-11-30 DIAGNOSIS — E7800 Pure hypercholesterolemia, unspecified: Secondary | ICD-10-CM | POA: Diagnosis present

## 2022-11-30 DIAGNOSIS — J302 Other seasonal allergic rhinitis: Secondary | ICD-10-CM | POA: Diagnosis present

## 2022-11-30 DIAGNOSIS — E119 Type 2 diabetes mellitus without complications: Secondary | ICD-10-CM | POA: Diagnosis present

## 2022-11-30 DIAGNOSIS — Z9221 Personal history of antineoplastic chemotherapy: Secondary | ICD-10-CM

## 2022-11-30 DIAGNOSIS — I82501 Chronic embolism and thrombosis of unspecified deep veins of right lower extremity: Secondary | ICD-10-CM | POA: Diagnosis present

## 2022-11-30 DIAGNOSIS — Z8041 Family history of malignant neoplasm of ovary: Secondary | ICD-10-CM

## 2022-11-30 DIAGNOSIS — N1831 Chronic kidney disease, stage 3a: Secondary | ICD-10-CM | POA: Diagnosis present

## 2022-11-30 DIAGNOSIS — E861 Hypovolemia: Secondary | ICD-10-CM | POA: Diagnosis present

## 2022-11-30 DIAGNOSIS — I129 Hypertensive chronic kidney disease with stage 1 through stage 4 chronic kidney disease, or unspecified chronic kidney disease: Secondary | ICD-10-CM | POA: Diagnosis present

## 2022-11-30 DIAGNOSIS — Z9011 Acquired absence of right breast and nipple: Secondary | ICD-10-CM

## 2022-11-30 DIAGNOSIS — A419 Sepsis, unspecified organism: Secondary | ICD-10-CM | POA: Diagnosis present

## 2022-11-30 DIAGNOSIS — I444 Left anterior fascicular block: Secondary | ICD-10-CM | POA: Diagnosis present

## 2022-11-30 DIAGNOSIS — R531 Weakness: Secondary | ICD-10-CM

## 2022-11-30 DIAGNOSIS — Z800 Family history of malignant neoplasm of digestive organs: Secondary | ICD-10-CM

## 2022-11-30 DIAGNOSIS — R1110 Vomiting, unspecified: Principal | ICD-10-CM

## 2022-11-30 DIAGNOSIS — Z880 Allergy status to penicillin: Secondary | ICD-10-CM

## 2022-11-30 DIAGNOSIS — E1169 Type 2 diabetes mellitus with other specified complication: Secondary | ICD-10-CM | POA: Diagnosis present

## 2022-11-30 DIAGNOSIS — Z7984 Long term (current) use of oral hypoglycemic drugs: Secondary | ICD-10-CM

## 2022-11-30 DIAGNOSIS — E871 Hypo-osmolality and hyponatremia: Secondary | ICD-10-CM | POA: Diagnosis present

## 2022-11-30 DIAGNOSIS — Z87891 Personal history of nicotine dependence: Secondary | ICD-10-CM

## 2022-11-30 DIAGNOSIS — E876 Hypokalemia: Secondary | ICD-10-CM | POA: Diagnosis not present

## 2022-11-30 DIAGNOSIS — N179 Acute kidney failure, unspecified: Secondary | ICD-10-CM | POA: Diagnosis present

## 2022-11-30 DIAGNOSIS — E1140 Type 2 diabetes mellitus with diabetic neuropathy, unspecified: Secondary | ICD-10-CM | POA: Diagnosis present

## 2022-11-30 DIAGNOSIS — E1122 Type 2 diabetes mellitus with diabetic chronic kidney disease: Secondary | ICD-10-CM | POA: Diagnosis present

## 2022-11-30 DIAGNOSIS — T502X5A Adverse effect of carbonic-anhydrase inhibitors, benzothiadiazides and other diuretics, initial encounter: Secondary | ICD-10-CM | POA: Diagnosis present

## 2022-11-30 DIAGNOSIS — G8929 Other chronic pain: Secondary | ICD-10-CM | POA: Diagnosis present

## 2022-11-30 DIAGNOSIS — Z86718 Personal history of other venous thrombosis and embolism: Secondary | ICD-10-CM | POA: Diagnosis present

## 2022-11-30 DIAGNOSIS — Z79899 Other long term (current) drug therapy: Secondary | ICD-10-CM

## 2022-11-30 DIAGNOSIS — Z1152 Encounter for screening for COVID-19: Secondary | ICD-10-CM

## 2022-11-30 DIAGNOSIS — M549 Dorsalgia, unspecified: Secondary | ICD-10-CM | POA: Diagnosis present

## 2022-11-30 DIAGNOSIS — Z7901 Long term (current) use of anticoagulants: Secondary | ICD-10-CM

## 2022-11-30 DIAGNOSIS — N390 Urinary tract infection, site not specified: Secondary | ICD-10-CM | POA: Diagnosis not present

## 2022-11-30 DIAGNOSIS — R3129 Other microscopic hematuria: Secondary | ICD-10-CM | POA: Diagnosis present

## 2022-11-30 DIAGNOSIS — E785 Hyperlipidemia, unspecified: Secondary | ICD-10-CM | POA: Diagnosis present

## 2022-11-30 DIAGNOSIS — I10 Essential (primary) hypertension: Secondary | ICD-10-CM | POA: Diagnosis present

## 2022-11-30 LAB — COMPREHENSIVE METABOLIC PANEL
ALT: 26 U/L (ref 0–44)
AST: 50 U/L — ABNORMAL HIGH (ref 15–41)
Albumin: 4 g/dL (ref 3.5–5.0)
Alkaline Phosphatase: 63 U/L (ref 38–126)
Anion gap: 19 — ABNORMAL HIGH (ref 5–15)
BUN: 17 mg/dL (ref 8–23)
CO2: 32 mmol/L (ref 22–32)
Calcium: 9.8 mg/dL (ref 8.9–10.3)
Chloride: 75 mmol/L — ABNORMAL LOW (ref 98–111)
Creatinine, Ser: 1.68 mg/dL — ABNORMAL HIGH (ref 0.44–1.00)
GFR, Estimated: 32 mL/min — ABNORMAL LOW (ref >60)
Glucose, Bld: 137 mg/dL — ABNORMAL HIGH (ref 70–99)
Potassium: 2.7 mmol/L — CL (ref 3.5–5.1)
Sodium: 126 mmol/L — ABNORMAL LOW (ref 135–145)
Total Bilirubin: 1.5 mg/dL — ABNORMAL HIGH (ref 0.3–1.2)
Total Protein: 8.2 g/dL — ABNORMAL HIGH (ref 6.5–8.1)

## 2022-11-30 LAB — CBC WITH DIFFERENTIAL/PLATELET
Abs Immature Granulocytes: 0.03 10*3/uL (ref 0.00–0.07)
Basophils Absolute: 0.1 10*3/uL (ref 0.0–0.1)
Basophils Relative: 1 %
Eosinophils Absolute: 0.1 10*3/uL (ref 0.0–0.5)
Eosinophils Relative: 1 %
HCT: 46 % (ref 36.0–46.0)
Hemoglobin: 15.5 g/dL — ABNORMAL HIGH (ref 12.0–15.0)
Immature Granulocytes: 0 %
Lymphocytes Relative: 25 %
Lymphs Abs: 3.1 10*3/uL (ref 0.7–4.0)
MCH: 29.5 pg (ref 26.0–34.0)
MCHC: 33.7 g/dL (ref 30.0–36.0)
MCV: 87.5 fL (ref 80.0–100.0)
Monocytes Absolute: 1.1 10*3/uL — ABNORMAL HIGH (ref 0.1–1.0)
Monocytes Relative: 9 %
Neutro Abs: 7.9 10*3/uL — ABNORMAL HIGH (ref 1.7–7.7)
Neutrophils Relative %: 64 %
Platelets: 295 10*3/uL (ref 150–400)
RBC: 5.26 MIL/uL — ABNORMAL HIGH (ref 3.87–5.11)
RDW: 13.6 % (ref 11.5–15.5)
WBC: 12.3 10*3/uL — ABNORMAL HIGH (ref 4.0–10.5)
nRBC: 0 % (ref 0.0–0.2)

## 2022-11-30 LAB — TROPONIN I (HIGH SENSITIVITY): Troponin I (High Sensitivity): 19 ng/L — ABNORMAL HIGH (ref <18)

## 2022-11-30 MED ORDER — POTASSIUM CHLORIDE CRYS ER 20 MEQ PO TBCR
40.0000 meq | EXTENDED_RELEASE_TABLET | Freq: Once | ORAL | Status: AC
  Administered 2022-12-01: 40 meq via ORAL
  Filled 2022-11-30: qty 2

## 2022-11-30 NOTE — ED Triage Notes (Signed)
Pt came in POV d/t generalized body aches over the past week. No fevers, some vomiting today, also reports have felt constipated the past few days & her body cannot seem to "rest."

## 2022-11-30 NOTE — ED Provider Triage Note (Addendum)
Emergency Medicine Provider Triage Evaluation Note  Yvonne [name] , a 76 y.o. female  was evaluated in triage.  Pt complains of "feeling like all my energy is sucked out." Feels very weak. Denies chest pain, SOB. Endorses 2 episodes of vomiting this AM.  Review of Systems  Positive: N/V, weakness Negative: Chest pain  Physical Exam  BP (!) 136/110 (BP Location: Left Leg)   Pulse 84   Resp (!) 21   SpO2 94%  Gen:   Awake, no distress   Resp:  Normal effort  MSK:   Moves extremities without difficulty  Other:    Medical Decision Making  Medically screening exam initiated at 6:17 PM.  Appropriate orders placed.  Judith Blonder was informed that the remainder of the evaluation will be completed by another provider, this initial triage assessment does not replace that evaluation, and the importance of remaining in the ED until their evaluation is complete.    Osvaldo Shipper, [place] 11/30/22 1817  K of 2.7, prolonged qtc, notified triage RN for bedding ASAP.   Osvaldo Shipper, [place] 11/30/22 1958

## 2022-11-30 NOTE — ED Notes (Signed)
PA notified of critical potassium

## 2022-12-01 ENCOUNTER — Inpatient Hospital Stay (HOSPITAL_COMMUNITY): Payer: Medicare HMO

## 2022-12-01 ENCOUNTER — Emergency Department (HOSPITAL_COMMUNITY): Payer: Medicare HMO

## 2022-12-01 DIAGNOSIS — Z9221 Personal history of antineoplastic chemotherapy: Secondary | ICD-10-CM | POA: Diagnosis not present

## 2022-12-01 DIAGNOSIS — A419 Sepsis, unspecified organism: Secondary | ICD-10-CM | POA: Insufficient documentation

## 2022-12-01 DIAGNOSIS — Z1152 Encounter for screening for COVID-19: Secondary | ICD-10-CM | POA: Diagnosis not present

## 2022-12-01 DIAGNOSIS — I2510 Atherosclerotic heart disease of native coronary artery without angina pectoris: Secondary | ICD-10-CM | POA: Diagnosis present

## 2022-12-01 DIAGNOSIS — Z8041 Family history of malignant neoplasm of ovary: Secondary | ICD-10-CM | POA: Diagnosis not present

## 2022-12-01 DIAGNOSIS — R3129 Other microscopic hematuria: Secondary | ICD-10-CM | POA: Diagnosis present

## 2022-12-01 DIAGNOSIS — Z800 Family history of malignant neoplasm of digestive organs: Secondary | ICD-10-CM | POA: Diagnosis not present

## 2022-12-01 DIAGNOSIS — Z9011 Acquired absence of right breast and nipple: Secondary | ICD-10-CM | POA: Diagnosis not present

## 2022-12-01 DIAGNOSIS — Z853 Personal history of malignant neoplasm of breast: Secondary | ICD-10-CM | POA: Diagnosis not present

## 2022-12-01 DIAGNOSIS — E1140 Type 2 diabetes mellitus with diabetic neuropathy, unspecified: Secondary | ICD-10-CM | POA: Diagnosis present

## 2022-12-01 DIAGNOSIS — E1122 Type 2 diabetes mellitus with diabetic chronic kidney disease: Secondary | ICD-10-CM | POA: Diagnosis present

## 2022-12-01 DIAGNOSIS — N390 Urinary tract infection, site not specified: Secondary | ICD-10-CM | POA: Diagnosis present

## 2022-12-01 DIAGNOSIS — M549 Dorsalgia, unspecified: Secondary | ICD-10-CM | POA: Diagnosis present

## 2022-12-01 DIAGNOSIS — E876 Hypokalemia: Secondary | ICD-10-CM | POA: Diagnosis present

## 2022-12-01 DIAGNOSIS — N1831 Chronic kidney disease, stage 3a: Secondary | ICD-10-CM | POA: Diagnosis present

## 2022-12-01 DIAGNOSIS — E785 Hyperlipidemia, unspecified: Secondary | ICD-10-CM | POA: Diagnosis not present

## 2022-12-01 DIAGNOSIS — I129 Hypertensive chronic kidney disease with stage 1 through stage 4 chronic kidney disease, or unspecified chronic kidney disease: Secondary | ICD-10-CM | POA: Diagnosis present

## 2022-12-01 DIAGNOSIS — J302 Other seasonal allergic rhinitis: Secondary | ICD-10-CM | POA: Diagnosis present

## 2022-12-01 DIAGNOSIS — E1169 Type 2 diabetes mellitus with other specified complication: Secondary | ICD-10-CM | POA: Diagnosis not present

## 2022-12-01 DIAGNOSIS — E871 Hypo-osmolality and hyponatremia: Secondary | ICD-10-CM | POA: Diagnosis present

## 2022-12-01 DIAGNOSIS — C50411 Malignant neoplasm of upper-outer quadrant of right female breast: Secondary | ICD-10-CM | POA: Diagnosis not present

## 2022-12-01 DIAGNOSIS — Z806 Family history of leukemia: Secondary | ICD-10-CM | POA: Diagnosis not present

## 2022-12-01 DIAGNOSIS — G8929 Other chronic pain: Secondary | ICD-10-CM | POA: Diagnosis present

## 2022-12-01 DIAGNOSIS — E7800 Pure hypercholesterolemia, unspecified: Secondary | ICD-10-CM | POA: Diagnosis present

## 2022-12-01 DIAGNOSIS — I82501 Chronic embolism and thrombosis of unspecified deep veins of right lower extremity: Secondary | ICD-10-CM | POA: Diagnosis present

## 2022-12-01 DIAGNOSIS — N179 Acute kidney failure, unspecified: Secondary | ICD-10-CM | POA: Diagnosis present

## 2022-12-01 DIAGNOSIS — I159 Secondary hypertension, unspecified: Secondary | ICD-10-CM | POA: Diagnosis not present

## 2022-12-01 DIAGNOSIS — E861 Hypovolemia: Secondary | ICD-10-CM | POA: Diagnosis present

## 2022-12-01 LAB — COMPREHENSIVE METABOLIC PANEL
ALT: 24 U/L (ref 0–44)
AST: 36 U/L (ref 15–41)
Albumin: 3.8 g/dL (ref 3.5–5.0)
Alkaline Phosphatase: 59 U/L (ref 38–126)
Anion gap: 20 — ABNORMAL HIGH (ref 5–15)
BUN: 18 mg/dL (ref 8–23)
CO2: 30 mmol/L (ref 22–32)
Calcium: 9.5 mg/dL (ref 8.9–10.3)
Chloride: 80 mmol/L — ABNORMAL LOW (ref 98–111)
Creatinine, Ser: 1.43 mg/dL — ABNORMAL HIGH (ref 0.44–1.00)
GFR, Estimated: 38 mL/min — ABNORMAL LOW (ref >60)
Glucose, Bld: 151 mg/dL — ABNORMAL HIGH (ref 70–99)
Potassium: 2.7 mmol/L — CL (ref 3.5–5.1)
Sodium: 130 mmol/L — ABNORMAL LOW (ref 135–145)
Total Bilirubin: 1.2 mg/dL (ref 0.3–1.2)
Total Protein: 7.3 g/dL (ref 6.5–8.1)

## 2022-12-01 LAB — PHOSPHORUS: Phosphorus: 2.4 mg/dL — ABNORMAL LOW (ref 2.5–4.6)

## 2022-12-01 LAB — SEDIMENTATION RATE: Sed Rate: 10 mm/hr (ref 0–22)

## 2022-12-01 LAB — CBC WITH DIFFERENTIAL/PLATELET
Abs Immature Granulocytes: 0.03 10*3/uL (ref 0.00–0.07)
Basophils Absolute: 0.1 10*3/uL (ref 0.0–0.1)
Basophils Relative: 1 %
Eosinophils Absolute: 0.1 10*3/uL (ref 0.0–0.5)
Eosinophils Relative: 1 %
HCT: 43 % (ref 36.0–46.0)
Hemoglobin: 14.8 g/dL (ref 12.0–15.0)
Immature Granulocytes: 0 %
Lymphocytes Relative: 28 %
Lymphs Abs: 3.3 10*3/uL (ref 0.7–4.0)
MCH: 29.7 pg (ref 26.0–34.0)
MCHC: 34.4 g/dL (ref 30.0–36.0)
MCV: 86.2 fL (ref 80.0–100.0)
Monocytes Absolute: 1.1 10*3/uL — ABNORMAL HIGH (ref 0.1–1.0)
Monocytes Relative: 9 %
Neutro Abs: 7.3 10*3/uL (ref 1.7–7.7)
Neutrophils Relative %: 61 %
Platelets: 240 10*3/uL (ref 150–400)
RBC: 4.99 MIL/uL (ref 3.87–5.11)
RDW: 13.7 % (ref 11.5–15.5)
WBC: 11.8 10*3/uL — ABNORMAL HIGH (ref 4.0–10.5)
nRBC: 0 % (ref 0.0–0.2)

## 2022-12-01 LAB — CBG MONITORING, ED: Glucose-Capillary: 137 mg/dL — ABNORMAL HIGH (ref 70–99)

## 2022-12-01 LAB — URINALYSIS, ROUTINE W REFLEX MICROSCOPIC
Bilirubin Urine: NEGATIVE
Glucose, UA: NEGATIVE mg/dL
Ketones, ur: NEGATIVE mg/dL
Nitrite: NEGATIVE
Protein, ur: 100 mg/dL — AB
RBC / HPF: >50 RBC/hpf — ABNORMAL HIGH (ref 0–5)
Specific Gravity, Urine: 1.009 (ref 1.005–1.030)
WBC, UA: >50 WBC/hpf — ABNORMAL HIGH (ref 0–5)
pH: 6 (ref 5.0–8.0)

## 2022-12-01 LAB — RESP PANEL BY RT-PCR (FLU A&B, COVID) ARPGX2
Influenza A by PCR: NEGATIVE
Influenza B by PCR: NEGATIVE
SARS Coronavirus 2 by RT PCR: NEGATIVE

## 2022-12-01 LAB — C-REACTIVE PROTEIN: CRP: 0.6 mg/dL (ref <1.0)

## 2022-12-01 LAB — GLUCOSE, CAPILLARY
Glucose-Capillary: 143 mg/dL — ABNORMAL HIGH (ref 70–99)
Glucose-Capillary: 103 mg/dL — ABNORMAL HIGH (ref 70–99)

## 2022-12-01 LAB — HEMOGLOBIN A1C
Hgb A1c MFr Bld: 6.7 % — ABNORMAL HIGH (ref 4.8–5.6)
Mean Plasma Glucose: 146 mg/dL

## 2022-12-01 LAB — MAGNESIUM: Magnesium: 2.8 mg/dL — ABNORMAL HIGH (ref 1.7–2.4)

## 2022-12-01 LAB — PROCALCITONIN: Procalcitonin: 0.3 ng/mL

## 2022-12-01 LAB — POTASSIUM: Potassium: 2.8 mmol/L — ABNORMAL LOW (ref 3.5–5.1)

## 2022-12-01 LAB — TROPONIN I (HIGH SENSITIVITY): Troponin I (High Sensitivity): 15 ng/L (ref <18)

## 2022-12-01 LAB — LACTIC ACID, PLASMA: Lactic Acid, Venous: 3 mmol/L (ref 0.5–1.9)

## 2022-12-01 MED ORDER — ONDANSETRON HCL 4 MG/2ML IJ SOLN
4.0000 mg | Freq: Four times a day (QID) | INTRAMUSCULAR | Status: DC | PRN
  Filled 2022-12-01: qty 2

## 2022-12-01 MED ORDER — HYDRALAZINE HCL 25 MG PO TABS: 25.0000 mg | ORAL_TABLET | Freq: Four times a day (QID) | ORAL | Status: DC | PRN

## 2022-12-01 MED ORDER — POTASSIUM & SODIUM PHOSPHATES 280-160-250 MG PO PACK
2.0000 | PACK | Freq: Once | ORAL | Status: AC
  Administered 2022-12-01: 2 via ORAL
  Filled 2022-12-01 (×2): qty 2

## 2022-12-01 MED ORDER — POTASSIUM CHLORIDE 10 MEQ/100ML IV SOLN
10.0000 meq | INTRAVENOUS | Status: AC
  Administered 2022-12-01 (×3): 10 meq via INTRAVENOUS
  Filled 2022-12-01 (×3): qty 100

## 2022-12-01 MED ORDER — ONDANSETRON HCL 4 MG/2ML IJ SOLN: 4.0000 mg | Freq: Four times a day (QID) | INTRAMUSCULAR | Status: DC | PRN

## 2022-12-01 MED ORDER — RIVAROXABAN 2.5 MG PO TABS
5.0000 mg | ORAL_TABLET | Freq: Every day | ORAL | Status: DC
  Administered 2022-12-01 – 2022-12-04 (×4): 5 mg via ORAL
  Filled 2022-12-01: qty 1
  Filled 2022-12-01 (×4): qty 2

## 2022-12-01 MED ORDER — SODIUM CHLORIDE 0.9 % IV BOLUS
1000.0000 mL | Freq: Once | INTRAVENOUS | Status: AC
  Administered 2022-12-01: 1000 mL via INTRAVENOUS

## 2022-12-01 MED ORDER — CIPROFLOXACIN IN D5W 400 MG/200ML IV SOLN
400.0000 mg | Freq: Two times a day (BID) | INTRAVENOUS | Status: DC
  Administered 2022-12-01 – 2022-12-03 (×5): 400 mg via INTRAVENOUS
  Filled 2022-12-01 (×5): qty 200

## 2022-12-01 MED ORDER — POTASSIUM CHLORIDE CRYS ER 20 MEQ PO TBCR
40.0000 meq | EXTENDED_RELEASE_TABLET | ORAL | Status: AC
  Administered 2022-12-01 (×3): 40 meq via ORAL
  Filled 2022-12-01 (×3): qty 2

## 2022-12-01 MED ORDER — GABAPENTIN 300 MG PO CAPS
900.0000 mg | ORAL_CAPSULE | Freq: Every day | ORAL | Status: DC
  Administered 2022-12-01 – 2022-12-03 (×3): 900 mg via ORAL
  Filled 2022-12-01 (×3): qty 3

## 2022-12-01 MED ORDER — HYDROCODONE-ACETAMINOPHEN 5-325 MG PO TABS: 1.0000 | ORAL_TABLET | Freq: Three times a day (TID) | ORAL | Status: DC | PRN

## 2022-12-01 MED ORDER — ROSUVASTATIN CALCIUM 20 MG PO TABS
20.0000 mg | ORAL_TABLET | Freq: Every day | ORAL | Status: DC
  Administered 2022-12-01 – 2022-12-04 (×4): 20 mg via ORAL
  Filled 2022-12-01 (×4): qty 1

## 2022-12-01 MED ORDER — SODIUM CHLORIDE 0.9 % IV SOLN: INTRAVENOUS | Status: DC

## 2022-12-01 MED ORDER — SODIUM CHLORIDE 0.9 % IV BOLUS
500.0000 mL | Freq: Once | INTRAVENOUS | Status: AC
  Administered 2022-12-01: 500 mL via INTRAVENOUS

## 2022-12-01 MED ORDER — INSULIN ASPART 100 UNIT/ML IJ SOLN
0.0000 [IU] | Freq: Three times a day (TID) | INTRAMUSCULAR | Status: DC
  Administered 2022-12-01 (×2): 2 [IU] via SUBCUTANEOUS

## 2022-12-01 MED ORDER — ONDANSETRON HCL 4 MG PO TABS: 4.0000 mg | ORAL_TABLET | Freq: Four times a day (QID) | ORAL | Status: DC | PRN

## 2022-12-01 MED ORDER — POTASSIUM CHLORIDE CRYS ER 20 MEQ PO TBCR
40.0000 meq | EXTENDED_RELEASE_TABLET | ORAL | Status: AC
  Administered 2022-12-01 (×2): 40 meq via ORAL
  Filled 2022-12-01 (×3): qty 2

## 2022-12-01 NOTE — H&P (Signed)
History and Physical    Yvonne [name] GDJ:[id number] DOB: 20-May-1946 DOA: 11/30/2022  PCP: Simona Huh, NP (Confirm with patient/family/NH records and if not entered, this has to be entered at [facility] point of entry) Patient coming from: Home  I have personally briefly reviewed patient's old medical records in [place]  Chief Complaint: Feeling weak  HPI: Yvonne [name] is a 76 y.o. female with medical history significant of HTN, breat cancer s/p mastectomy, DVT on Xarelto, IIDM, HLD, chronic diabetic neuropathy, presented with worsening of generalized weakness.  Symptoms started about 1 week ago, patient started to feel lethargy, lack of energy and generalized weakness denies any muscle aching joint pain fever chills, no chest pain or abdominal pain.  She also denied dysuria, no diarrhea no nauseous vomiting.  No recent medication changes.  ED Course: Afebrile, none tachycardia none hypotension not hypoxic.  Blood work showed hypokalemia 2.7, creatinine 1.6, Na=126.  Mg=2.8. UA showed RBC> 50, WBC> 50 with large leukocytes but negative nitrite.  Procalcitonin 0.3.  WBC 11.8.  Patient was given IV and p.o. potassium replacement, IV fluid, repeat BMP showed improved creatinine 1.4 and potassium 2.7.  CT head negative for acute findings.  Chest x-ray no acute infiltrates.  Review of Systems: As per HPI otherwise 14 point review of systems negative.    Past Medical History:  Diagnosis Date   Arthritis    Breast cancer ([place]) 04/2012   right breast   Bruises easily    Cancer ([place]) 03/25/12   right breast   Chronic back pain    arthritis   Elevated cholesterol    takes Crestor daily   Headache(784.0)    Hemorrhoids    History of chemotherapy    x 1, pt unable to tolerate, on PO chemotherpay   Hypertension    takes Maxzide daily    Phlebitis 67yr ago   hx of-   Seasonal allergies    takes Zyrtec daily and Nasal Spray prn    Past Surgical History:  Procedure  Laterality Date   APPENDECTOMY  185yrago   BREAST EXCISIONAL BIOPSY Left    BREAST SURGERY  04/27/12   Right mod mastectomy, ER/PR +, HER2 -   BREAST SURGERY  04/27/12   Left Breast NL lumpectomy-no malignancy   COLONOSCOPY     MASTECTOMY Right 2013   PORT-A-CATH REMOVAL  11/24/2012   Procedure: REMOVAL PORT-A-CATH;  Surgeon: [name];  [place];  Laterality: Left;   PORTACATH PLACEMENT  04/26/2012   Procedure: INSERTION PORT-A-CATH;  Surgeon: [name];  [place] Service: General;  Laterality: Left;  Started at 1746.   TUBAL LIGATION  3772yr[place]     reports that she quit smoking about 38 years ago. She has never used smokeless tobacco. She reports that she does not drink alcohol and does not use drugs.  Allergies  Allergen Reactions   Penicillins Anaphylaxis    Family History  Problem Relation Age of Onset   Cancer Mother        ovarian cancer   Leukemia Father    Cancer Father        leukemia   Cancer Sister 57 49    colon cancer   Cancer Brother 60 8    colon cancer   Cancer Brother        prostate cancer, deceased   Anesthesia problems Neg Hx    Hypotension  Neg Hx    Malignant hyperthermia Neg Hx    Pseudochol deficiency Neg Hx      Prior to Admission medications   Medication Sig Start Date End Date Taking? Authorizing Provider  gabapentin (NEURONTIN) 300 MG capsule Take 900 mg by mouth at bedtime. 07/20/20  Yes [provider]  HYDROcodone-acetaminophen (NORCO/VICODIN) 5-325 MG tablet Take 1 tablet by mouth 3 (three) times daily as needed for moderate pain. 07/15/20  Yes [provider]  metFORMIN (GLUCOPHAGE) 500 MG tablet Take 250 mg by mouth 2 (two) times daily with a meal.   Yes [provider]  rivaroxaban (XARELTO) 2.5 MG TABS tablet Take 5 mg by mouth daily. 08/12/20  Yes [provider]  rosuvastatin (CRESTOR) 20 MG tablet Take 20 mg by mouth daily.   Yes  [provider]  triamterene-hydrochlorothiazide (MAXZIDE-25) 37.5-25 MG tablet Take 1 tablet by mouth daily.   Yes [provider]  Vitamin D, Ergocalciferol, (DRISDOL) 1.25 MG (50000 UNIT) CAPS capsule Take 50,000 Units by mouth every 7 (seven) days. Sunday 07/15/20  Yes [provider]  Lancets (FREESTYLE) lancets daily. 07/14/20   [provider]    Physical Exam: Vitals:   12/01/22 0845 12/01/22 0848 12/01/22 0900 12/01/22 0913  BP: (!) 125/55  (!) 112/42   Pulse: 67  (!) 58   Resp: 16  16   Temp:  97.8 F (36.6 C)    TempSrc:  Oral    SpO2: 99%  97% 98%    Constitutional: NAD, calm, comfortable Vitals:   12/01/22 0845 12/01/22 0848 12/01/22 0900 12/01/22 0913  BP: (!) 125/55  (!) 112/42   Pulse: 67  (!) 58   Resp: 16  16   Temp:  97.8 F (36.6 C)    TempSrc:  Oral    SpO2: 99%  97% 98%   Eyes: PERRL, lids and conjunctivae normal ENMT: Mucous membranes are moist.dry. Neck: normal, supple, no masses, no thyromegaly Respiratory: clear to auscultation bilaterally, no wheezing, no crackles. Normal respiratory effort. No accessory muscle use.  Cardiovascular: Regular rate and rhythm, no murmurs / rubs / gallops. No extremity edema. 2+ pedal pulses. No carotid bruits.  Abdomen: no tenderness, no masses palpated. No hepatosplenomegaly. Bowel sounds positive.  Musculoskeletal: no clubbing / cyanosis. No joint deformity upper and lower extremities. Good ROM, no contractures. Normal muscle tone.  Skin: no rashes, lesions, ulcers. No induration Neurologic: CN 2-12 grossly intact. Sensation intact, DTR normal. Strength 5/5 in all 4.  Psychiatric: Normal judgment and insight. Alert and oriented x 3. Normal mood.     Labs on Admission: I have personally reviewed following labs and imaging studies  CBC: Recent Labs  Lab 11/30/22 1759 12/01/22 0645  WBC 12.3* 11.8*  NEUTROABS 7.9* 7.3  HGB 15.5* 14.8  HCT 46.0 43.0  MCV 87.5 86.2  PLT 295  517   Basic Metabolic Panel: Recent Labs  Lab 11/30/22 1759 12/01/22 0025 12/01/22 0645  NA 126*  --  130*  K 2.7*  --  2.7*  CL 75*  --  80*  CO2 32  --  30  GLUCOSE 137*  --  151*  BUN 17  --  18  CREATININE 1.68*  --  1.43*  CALCIUM 9.8  --  9.5  MG  --  2.8*  --    GFR: CrCl cannot be calculated (Unknown ideal weight.). Liver Function Tests: Recent Labs  Lab 11/30/22 1759 12/01/22 0645  AST 50* 36  ALT 26  24  ALKPHOS 63 59  BILITOT 1.5* 1.2  PROT 8.2* 7.3  ALBUMIN 4.0 3.8   No results for input(s): "LIPASE", "AMYLASE" in the last 168 hours. No results for input(s): "AMMONIA" in the last 168 hours. Coagulation Profile: No results for input(s): "INR", "PROTIME" in the last 168 hours. Cardiac Enzymes: No results for input(s): "CKTOTAL", "CKMB", "CKMBINDEX", "TROPONINI" in the last 168 hours. BNP (last 3 results) No results for input(s): "PROBNP" in the last 8760 hours. HbA1C: No results for input(s): "HGBA1C" in the last 72 hours. CBG: No results for input(s): "GLUCAP" in the last 168 hours. Lipid Profile: No results for input(s): "CHOL", "HDL", "LDLCALC", "TRIG", "CHOLHDL", "LDLDIRECT" in the last 72 hours. Thyroid Function Tests: No results for input(s): "TSH", "T4TOTAL", "FREET4", "T3FREE", "THYROIDAB" in the last 72 hours. Anemia Panel: No results for input(s): "VITAMINB12", "FOLATE", "FERRITIN", "TIBC", "IRON", "RETICCTPCT" in the last 72 hours. Urine analysis:    Component Value Date/Time   COLORURINE YELLOW 12/01/2022 0212   APPEARANCEUR CLOUDY (A) 12/01/2022 0212   LABSPEC 1.009 12/01/2022 0212   PHURINE 6.0 12/01/2022 0212   GLUCOSEU NEGATIVE 12/01/2022 0212   HGBUR MODERATE (A) 12/01/2022 0212   BILIRUBINUR NEGATIVE 12/01/2022 0212   KETONESUR NEGATIVE 12/01/2022 0212   PROTEINUR 100 (A) 12/01/2022 0212   NITRITE NEGATIVE 12/01/2022 0212   LEUKOCYTESUR LARGE (A) 12/01/2022 0212    Radiological Exams on Admission: US RENAL  Result Date:  12/01/2022 CLINICAL DATA:  Acute kidney injury EXAM: RENAL / URINARY TRACT ULTRASOUND COMPLETE COMPARISON:  None Available. FINDINGS: Right Kidney: Renal measurements: 10.4 x 4.7 x 4.5 cm = volume: 115 mL. Echogenicity within normal limits. No mass or hydronephrosis visualized. Left Kidney: Renal measurements: 10.4 x 4.3 x 3.7 cm = volume: 88 mL. Echogenicity within normal limits. No mass or hydronephrosis visualized. Bladder: Appears normal for degree of bladder distention. Other: None. IMPRESSION: No evidence of obstructive uropathy. Electronically Signed   By: Davina Poke D.O.   On: 12/01/2022 08:52   CT HEAD WO CONTRAST (5MM)  Result Date: 12/01/2022 CLINICAL DATA:  Delirium EXAM: CT HEAD WITHOUT CONTRAST TECHNIQUE: Contiguous axial images were obtained from the base of the skull through the vertex without intravenous contrast. RADIATION DOSE REDUCTION: This exam was performed according to the departmental dose-optimization program which includes automated exposure control, adjustment of the mA and/or kV according to patient size and/or use of iterative reconstruction technique. COMPARISON:  None Available. FINDINGS: Brain: There is no mass, hemorrhage or extra-axial collection. There is generalized atrophy without lobar predilection. Hypodensity of the white matter is most commonly associated with chronic microvascular disease. Vascular: No abnormal hyperdensity of the major intracranial arteries or dural venous sinuses. No intracranial atherosclerosis. Skull: The visualized skull base, calvarium and extracranial soft tissues are normal. Sinuses/Orbits: No fluid levels or advanced mucosal thickening of the visualized paranasal sinuses. No mastoid or middle ear effusion. The orbits are normal. IMPRESSION: 1. No acute intracranial abnormality. 2. Generalized atrophy and findings of chronic microvascular disease. Electronically Signed   By: Ulyses Jarred M.D.   On: 12/01/2022 01:47   DG Chest 2  View  Result Date: 11/30/2022 CLINICAL DATA:  Weakness EXAM: CHEST - 2 VIEW COMPARISON:  04/26/2012 FINDINGS: Previously seen left chest wall port has been removed. Cardiac shadow is within normal limits. Aortic calcifications are seen. The lungs are clear bilaterally. Degenerative change of the thoracic spine is noted. IMPRESSION: No active cardiopulmonary disease. Electronically Signed   By: Inez Catalina M.D.   On:  11/30/2022 19:56    EKG: Independently reviewed.  Sinus, no acute ST changes, prolonged QTc.  Assessment/Plan Principal Problem:   Sepsis due to urinary tract infection ([place]) Active Problems:   Sepsis ([place])  (please populate well all problems here in Problem List. (For example, if patient is on BP meds at home and you resume or decide to hold them, it is a problem that needs to be her. Same for CAD, COPD, HLD and so on)  Severe hypokalemia -Likely secondary to HCTZ -Continue p.o. replacement, recheck potassium level this afternoon. -Mg normal, will send Phos  AKI -Probably prerenal, as creatinine level improved after initial IV hydration and bolus. Plan to continue IV hydration for today. -Renal U/S negative for obstruction  Hyponatremia -Hypovolemic likely secondary to AKI and HCTZ -Continue IV hydration, continue to hold HCTZ, probably need to consider switch HCTZ to other BP meds on discharge.  UTI -With hematuria microscopic -Continue Cipro while waiting for urine culture results, patient is allergic to penicillin.  IIDM -Hold off metformin given there is a AKI, start sliding scale.  Hx of DVT -On renal dosed Xarelto  DVT prophylaxis: Xarelto Code Status: Full code Family Communication: Husband at bedside Disposition Plan: Patient comes with AKI, severe hyponatremia, depends on clinical progress, expect less than 2 midnight hospital stay Consults called: None Admission status: Tele obs   Lequita Halt MD [facility] Pager [phone]  12/01/2022, 9:33  AM

## 2022-12-01 NOTE — ED Notes (Signed)
ED TO INPATIENT HANDOFF REPORT    S Name/Age/Gender Yvonne [name] 76 y.o. female Room/Bed: 010C/010C  Code Status   Code Status: Full Code  Home/SNF/Other Home Patient oriented to: self, place, time, and situation Is this baseline? Yes   Triage Complete: Triage complete  Chief Complaint Sepsis due to urinary tract infection (Yvonne [name]) [A41.9, N39.0] Sepsis (Yvonne [name]) [A41.9]  Triage Note Pt came in POV d/t generalized body aches over the past week. No fevers, some vomiting today, also reports have felt constipated the past few days & her body cannot seem to "rest."   Allergies Allergies  Allergen Reactions   Penicillins Anaphylaxis    Level of Care/Admitting Diagnosis ED Disposition     ED Disposition  Admit   Condition  --   [place]: Yvonne [name] [[id number]]  Level of Care: Telemetry Medical [104]  May admit patient to Yvonne [name] or Yvonne [name] if equivalent level of care is available:: No  Covid Evaluation: Confirmed COVID Negative  Diagnosis: Sepsis [facility]) [[id number]]  Admitting Physician: Lequita Halt [[id number]]  Attending Physician: Lequita Halt [[id number]]  Certification:: I certify this patient will need inpatient services for at least 2 midnights          B Medical/Surgery History Past Medical History:  Diagnosis Date   Arthritis    Breast cancer (Yvonne [name]) 04/2012   right breast   Bruises easily    Cancer (Yvonne [name]) 03/25/12   right breast   Chronic back pain    arthritis   Elevated cholesterol    takes Crestor daily   Headache(784.0)    Hemorrhoids    History of chemotherapy    x 1, pt unable to tolerate, on PO chemotherpay   Hypertension    takes Maxzide daily    Phlebitis 64yr ago   hx of-   Seasonal allergies    takes Zyrtec daily and Nasal Spray prn   Past Surgical History:  Procedure Laterality Date   APPENDECTOMY  155yrago   BREAST EXCISIONAL BIOPSY Left    BREAST SURGERY  04/27/12   Right mod mastectomy, ER/PR  +, HER2 -   BREAST SURGERY  04/27/12   Left Breast NL lumpectomy-no malignancy   COLONOSCOPY     MASTECTOMY Right 2013   PORT-A-CATH REMOVAL  11/24/2012   Procedure: REMOVAL PORT-A-CATH;  Surgeon: [name];  [place] [facility] Service: General;  Laterality: Left;   PORTACATH PLACEMENT  04/26/2012   Procedure: INSERTION PORT-A-CATH;  Surgeon: [name];  [place] [facility] Service: General;  Laterality: Left;  Started at 1746.   TUBAL LIGATION  3751yrgo     A IV Location/Drains/Wounds Patient Lines/Drains/Airways Status     Active Line/Drains/Airways     Name Placement date Placement time Site Days   Implanted Port 04/26/12 Left Chest 04/26/12  1757  Chest  3871   Implanted Port 04/26/12 Left Chest 04/26/12  --  Chest  3871   Peripheral IV 12/01/22 20 G Anterior;Distal;Left Forearm 12/01/22  0031  Forearm  less than 1   Peripheral IV 12/01/22 20 G Left Antecubital 12/01/22  0651  Antecubital  less than 1   Closed System Drain 2 Right;Inferior Breast Bulb (JP) 19 Fr. 04/26/12  1750  Breast  3871   Closed System Drain 1 Right Breast Bulb (JP) 04/26/12  --  Breast  3871   Incision 04/26/12 Breast Right 04/26/12  1838  -- 3871   Incision 04/26/12  Breast Left 04/26/12  1838  -- 3871   Incision 04/26/12 Chest Left;Upper 04/26/12  1838  -- 3871   Incision 11/24/12 Chest Left 11/24/12  0824  -- 3659            Intake/Output Last 24 hours No intake or output data in the 24 hours ending 12/01/22 1253  Labs/Imaging Results for orders placed or performed during the hospital encounter of 11/30/22 (from the past 48 hour(s))  Comprehensive metabolic panel     Status: Abnormal   Collection Time: 11/30/22  5:59 PM  Result Value Ref Range   Sodium 126 (L) 135 - 145 mmol/L   Potassium 2.7 (LL) 3.5 - 5.1 mmol/L    Comment: HEMOLYSIS AT THIS LEVEL MAY AFFECT RESULT CRITICAL RESULT CALLED TO, READ BACK BY AND VERIFIED WITH Gilman Buttner, RN @ [phone] 11/30/22 BY  SEKDAHL    Chloride 75 (L) 98 - 111 mmol/L   CO2 32 22 - 32 mmol/L   Glucose, Bld 137 (H) 70 - 99 mg/dL    Comment: Glucose reference range applies only to samples taken after fasting for at least 8 hours.   BUN 17 8 - 23 mg/dL   Creatinine, Ser 1.68 (H) 0.44 - 1.00 mg/dL   Calcium 9.8 8.9 - 10.3 mg/dL   Total Protein 8.2 (H) 6.5 - 8.1 g/dL   Albumin 4.0 3.5 - 5.0 g/dL   AST 50 (H) 15 - 41 U/L    Comment: HEMOLYSIS AT THIS LEVEL MAY AFFECT RESULT   ALT 26 0 - 44 U/L    Comment: HEMOLYSIS AT THIS LEVEL MAY AFFECT RESULT   Alkaline Phosphatase 63 38 - 126 U/L   Total Bilirubin 1.5 (H) 0.3 - 1.2 mg/dL    Comment: HEMOLYSIS AT THIS LEVEL MAY AFFECT RESULT   GFR, Estimated 32 (L) >60 mL/min    Comment: (NOTE) Calculated using the CKD-EPI Creatinine Equation (2021)    Anion gap 19 (H) 5 - 15    Comment: Performed at [facility], [place] [address] [name], [place] [address]  CBC with Differential     Status: Abnormal   Collection Time: 11/30/22  5:59 PM  Result Value Ref Range   WBC 12.3 (H) 4.0 - 10.5 K/uL   RBC 5.26 (H) 3.87 - 5.11 MIL/uL   Hemoglobin 15.5 (H) 12.0 - 15.0 g/dL   HCT 46.0 36.0 - 46.0 %   MCV 87.5 80.0 - 100.0 fL   MCH 29.5 26.0 - 34.0 pg   MCHC 33.7 30.0 - 36.0 g/dL   RDW 13.6 11.5 - 15.5 %   Platelets 295 150 - 400 K/uL   nRBC 0.0 0.0 - 0.2 %   Neutrophils Relative % 64 %   Neutro Abs 7.9 (H) 1.7 - 7.7 K/uL   Lymphocytes Relative 25 %   Lymphs Abs 3.1 0.7 - 4.0 K/uL   Monocytes Relative 9 %   Monocytes Absolute 1.1 (H) 0.1 - 1.0 K/uL   Eosinophils Relative 1 %   Eosinophils Absolute 0.1 0.0 - 0.5 K/uL   Basophils Relative 1 %   Basophils Absolute 0.1 0.0 - 0.1 K/uL   Immature Granulocytes 0 %   Abs Immature Granulocytes 0.03 0.00 - 0.07 K/uL    Comment: Performed at [facility], Yvonne [name] [address] [place], [place] [address]  Troponin I (High Sensitivity)     Status: Abnormal   Collection Time: 11/30/22  5:59 PM  Result Value Ref Range  Troponin I (High Sensitivity) 19 (H) <18 ng/L    Comment: (NOTE) Elevated high sensitivity troponin I (hsTnI) values and significant  changes across serial measurements may suggest ACS but many other  chronic and acute conditions are known to elevate hsTnI results.  Refer to the "Links" section for chest pain algorithms and additional  guidance. Performed at [facility], [place] [address], [place], [place] [address]   Troponin I (High Sensitivity)     Status: None   Collection Time: 12/01/22 12:25 AM  Result Value Ref Range   Troponin I (High Sensitivity) 15 <18 ng/L    Comment: (NOTE) Elevated high sensitivity troponin I (hsTnI) values and significant  changes across serial measurements may suggest ACS but many other  chronic and acute conditions are known to elevate hsTnI results.  Refer to the "Links" section for chest pain algorithms and additional  guidance. Performed at [facility], [place] [address], [place], [place] [address]   Magnesium     Status: Abnormal   Collection Time: 12/01/22 12:25 AM  Result Value Ref Range   Magnesium 2.8 (H) 1.7 - 2.4 mg/dL    Comment: Performed at [facility] [address] [place],  [address]  Resp Panel by RT-PCR (Flu A&B, Covid) Anterior Nasal Swab     Status: None   Collection Time: 12/01/22  2:00 AM   Specimen: Anterior Nasal Swab  Result Value Ref Range   SARS Coronavirus 2 by RT PCR NEGATIVE NEGATIVE    Comment: (NOTE) SARS-CoV-2 target nucleic acids are NOT DETECTED.  The SARS-CoV-2 RNA is generally detectable in upper respiratory specimens during the acute phase of infection. The lowest concentration of SARS-CoV-2 viral copies this assay can detect is 138 copies/mL. A negative result does not preclude SARS-Cov-2 infection and should not be used as the sole basis for treatment or other patient management decisions. A negative result may occur with  improper specimen collection/handling, submission of  specimen other than nasopharyngeal swab, presence of viral mutation(s) within the areas targeted by this assay, and inadequate number of viral copies(<138 copies/mL). A negative result must be combined with clinical observations, patient history, and epidemiological information. The expected result is Negative.  Fact Sheet for Patients:  EntrepreneurPulse.com.au  Fact Sheet for Healthcare Providers:  IncredibleEmployment.be  This test is no t yet approved or cleared by the [place] FDA and  has been authorized for detection and/or diagnosis of SARS-CoV-2 by FDA under an Emergency Use Authorization (EUA). This EUA will remain  in effect (meaning this test can be used) for the duration of the COVID-19 declaration under Section 564(b)(1) of the Act, 21 U.S.C.section 360bbb-3(b)(1), unless the authorization is terminated  or revoked sooner.       Influenza A by PCR NEGATIVE NEGATIVE   Influenza B by PCR NEGATIVE NEGATIVE    Comment: (NOTE) The Xpert Xpress SARS-CoV-2/FLU/RSV plus assay is intended as an aid in the diagnosis of influenza from Nasopharyngeal swab specimens and should not be used as a sole basis for treatment. Nasal washings and aspirates are unacceptable for Xpert Xpress SARS-CoV-2/FLU/RSV testing.  Fact Sheet for Patients: EntrepreneurPulse.com.au  Fact Sheet for Healthcare Providers: IncredibleEmployment.be  This test is not yet approved or cleared by the [place] FDA and has been authorized for detection and/or diagnosis of SARS-CoV-2 by FDA under an Emergency Use Authorization (EUA). This EUA will remain in effect (meaning this test can be used) for the duration of the COVID-19 declaration  under Section 564(b)(1) of the Act, 21 U.S.C. section 360bbb-3(b)(1), unless the authorization is terminated or revoked.  Performed at [facility], [place] [address], [place],  [place] [address]   Urinalysis, Routine w reflex microscopic Urine, Clean Catch     Status: Abnormal   Collection Time: 12/01/22  2:12 AM  Result Value Ref Range   Color, Urine YELLOW YELLOW   APPearance CLOUDY (A) CLEAR   Specific Gravity, Urine 1.009 1.005 - 1.030   pH 6.0 5.0 - 8.0   Glucose, UA NEGATIVE NEGATIVE mg/dL   Hgb urine dipstick MODERATE (A) NEGATIVE   Bilirubin Urine NEGATIVE NEGATIVE   Ketones, ur NEGATIVE NEGATIVE mg/dL   Protein, ur 100 (A) NEGATIVE mg/dL   Nitrite NEGATIVE NEGATIVE   Leukocytes,Ua LARGE (A) NEGATIVE   RBC / HPF >50 (H) 0 - 5 RBC/hpf   WBC, UA >50 (H) 0 - 5 WBC/hpf   Bacteria, UA FEW (A) NONE SEEN   Squamous Epithelial / LPF 0-5 0 - 5   WBC Clumps PRESENT     Comment: Performed at [facility], [place] [address] [place],  [address]  Comprehensive metabolic panel     Status: Abnormal   Collection Time: 12/01/22  6:45 AM  Result Value Ref Range   Sodium 130 (L) 135 - 145 mmol/L   Potassium 2.7 (LL) 3.5 - 5.1 mmol/L    Comment: CRITICAL RESULT CALLED TO, READ BACK BY AND VERIFIED WITH P.FURROW,RN 0801 12/01/22 CLARK,S   Chloride 80 (L) 98 - 111 mmol/L   CO2 30 22 - 32 mmol/L   Glucose, Bld 151 (H) 70 - 99 mg/dL    Comment: Glucose reference range applies only to samples taken after fasting for at least 8 hours.   BUN 18 8 - 23 mg/dL   Creatinine, Ser 1.43 (H) 0.44 - 1.00 mg/dL   Calcium 9.5 8.9 - 10.3 mg/dL   Total Protein 7.3 6.5 - 8.1 g/dL   Albumin 3.8 3.5 - 5.0 g/dL   AST 36 15 - 41 U/L   ALT 24 0 - 44 U/L   Alkaline Phosphatase 59 38 - 126 U/L   Total Bilirubin 1.2 0.3 - 1.2 mg/dL   GFR, Estimated 38 (L) >60 mL/min    Comment: (NOTE) Calculated using the CKD-EPI Creatinine Equation (2021)    Anion gap 20 (H) 5 - 15    Comment: Performed at [facility] [address], [place],  [address]  CBC with Differential     Status: Abnormal   Collection Time: 12/01/22  6:45 AM  Result Value Ref Range   WBC 11.8 (H) 4.0 -  10.5 K/uL   RBC 4.99 3.87 - 5.11 MIL/uL   Hemoglobin 14.8 12.0 - 15.0 g/dL   HCT 43.0 36.0 - 46.0 %   MCV 86.2 80.0 - 100.0 fL   MCH 29.7 26.0 - 34.0 pg   MCHC 34.4 30.0 - 36.0 g/dL   RDW 13.7 11.5 - 15.5 %   Platelets 240 150 - 400 K/uL   nRBC 0.0 0.0 - 0.2 %   Neutrophils Relative % 61 %   Neutro Abs 7.3 1.7 - 7.7 K/uL   Lymphocytes Relative 28 %   Lymphs Abs 3.3 0.7 - 4.0 K/uL   Monocytes Relative 9 %   Monocytes Absolute 1.1 (H) 0.1 - 1.0 K/uL   Eosinophils Relative 1 %   Eosinophils Absolute 0.1 0.0 - 0.5 K/uL   Basophils Relative 1 %  Basophils Absolute 0.1 0.0 - 0.1 K/uL   Immature Granulocytes 0 %   Abs Immature Granulocytes 0.03 0.00 - 0.07 K/uL    Comment: Performed at [facility], [place] [address], [place], [place] [address]  C-reactive protein     Status: None   Collection Time: 12/01/22  6:45 AM  Result Value Ref Range   CRP 0.6 <1.0 mg/dL    Comment: Performed at [facility] [address] [place], [place] [address]  Sedimentation rate     Status: None   Collection Time: 12/01/22  6:45 AM  Result Value Ref Range   Sed Rate 10 0 - 22 mm/hr    Comment: Performed at [facility] [address] [place], [place] [address]  Procalcitonin - Baseline     Status: None   Collection Time: 12/01/22  6:45 AM  Result Value Ref Range   Procalcitonin 0.30 ng/mL    Comment:        Interpretation: PCT (Procalcitonin) <= 0.5 ng/mL: Systemic infection (sepsis) is not likely. Local bacterial infection is possible. (NOTE)       Sepsis PCT Algorithm           Lower Respiratory Tract                                      Infection PCT Algorithm    ----------------------------     ----------------------------         PCT < 0.25 ng/mL                PCT < 0.10 ng/mL          Strongly encourage             Strongly discourage   discontinuation of antibiotics    initiation of antibiotics    ----------------------------     -----------------------------        PCT 0.25 - 0.50 ng/mL            PCT 0.10 - 0.25 ng/mL               OR       >80% decrease in PCT            Discourage initiation of                                            antibiotics      Encourage discontinuation           of antibiotics    ----------------------------     -----------------------------         PCT >= 0.50 ng/mL              PCT 0.26 - 0.50 ng/mL               AND        <80% decrease in PCT             Encourage initiation of                                             antibiotics       Encourage continuation  of antibiotics    ----------------------------     -----------------------------        PCT >= 0.50 ng/mL                  PCT > 0.50 ng/mL               AND         increase in PCT                  Strongly encourage                                      initiation of antibiotics    Strongly encourage escalation           of antibiotics                                     -----------------------------                                           PCT <= 0.25 ng/mL                                                 OR                                        > 80% decrease in PCT                                      Discontinue / Do not initiate                                             antibiotics  Performed at [facility], [address], [place], [place] [address]   Phosphorus     Status: Abnormal   Collection Time: 12/01/22  6:45 AM  Result Value Ref Range   Phosphorus 2.4 (L) 2.5 - 4.6 mg/dL    Comment: Performed at [facility] [address] [place], [place] [address]  Lactic acid, plasma     Status: Abnormal   Collection Time: 12/01/22  8:23 AM  Result Value Ref Range   Lactic Acid, Venous 3.0 (HH) 0.5 - 1.9 mmol/L    Comment: CRITICAL RESULT CALLED TO, READ BACK BY AND VERIFIED WITH Yitzchok Carriger,S RN @ [phone] 12/01/22 LEONARD,A Performed at [facility], [place] [address], [place], [place] [address]   CBG monitoring, ED      Status: Abnormal   Collection Time: 12/01/22 12:41 PM  Result Value Ref Range   Glucose-Capillary 137 (H) 70 - 99 mg/dL    Comment: Glucose reference range applies only to samples taken after fasting for at least 8 hours.   US RENAL  Result Date: 12/01/2022 CLINICAL DATA:  Acute kidney injury EXAM: RENAL / URINARY TRACT ULTRASOUND COMPLETE COMPARISON:  None Available. FINDINGS: Right Kidney: Renal measurements: 10.4 x 4.7 x 4.5 cm = volume: 115 mL. Echogenicity within normal limits. No mass or hydronephrosis visualized. Left Kidney: Renal measurements: 10.4 x 4.3 x 3.7 cm = volume: 88 mL. Echogenicity within normal limits. No mass or hydronephrosis visualized. Bladder: Appears normal for degree of bladder distention. Other: None. IMPRESSION: No evidence of obstructive uropathy. Electronically Signed   By: Davina Poke D.O.   On: 12/01/2022 08:52   CT HEAD WO CONTRAST (5MM)  Result Date: 12/01/2022 CLINICAL DATA:  Delirium EXAM: CT HEAD WITHOUT CONTRAST TECHNIQUE: Contiguous axial images were obtained from the base of the skull through the vertex without intravenous contrast. RADIATION DOSE REDUCTION: This exam was performed according to the departmental dose-optimization program which includes automated exposure control, adjustment of the mA and/or kV according to patient size and/or use of iterative reconstruction technique. COMPARISON:  None Available. FINDINGS: Brain: There is no mass, hemorrhage or extra-axial collection. There is generalized atrophy without lobar predilection. Hypodensity of the white matter is most commonly associated with chronic microvascular disease. Vascular: No abnormal hyperdensity of the major intracranial arteries or dural venous sinuses. No intracranial atherosclerosis. Skull: The visualized skull base, calvarium and extracranial soft tissues are normal. Sinuses/Orbits: No fluid levels or advanced mucosal thickening of the visualized paranasal sinuses. No mastoid  or middle ear effusion. The orbits are normal. IMPRESSION: 1. No acute intracranial abnormality. 2. Generalized atrophy and findings of chronic microvascular disease. Electronically Signed   By: Ulyses Jarred M.D.   On: 12/01/2022 01:47   DG Chest 2 View  Result Date: 11/30/2022 CLINICAL DATA:  Weakness EXAM: CHEST - 2 VIEW COMPARISON:  04/26/2012 FINDINGS: Previously seen left chest wall port has been removed. Cardiac shadow is within normal limits. Aortic calcifications are seen. The lungs are clear bilaterally. Degenerative change of the thoracic spine is noted. IMPRESSION: No active cardiopulmonary disease. Electronically Signed   By: Inez Catalina M.D.   On: 11/30/2022 19:56    Pending Labs Unresulted Labs (From admission, onward)     Start     Ordered   12/02/22 0867  Basic metabolic panel  Tomorrow morning,   R        12/01/22 0820   12/02/22 0500  CBC  Tomorrow morning,   R        12/01/22 0820   12/01/22 1500  Potassium  Once,   R        12/01/22 0944   12/01/22 0821  Hemoglobin A1c  Once,   AD       Comments: To assess prior glycemic control    12/01/22 0821   12/01/22 0624  Culture, blood (Routine X 2) w Reflex to ID Panel  BLOOD CULTURE X 2,   R      12/01/22 6195   12/01/22 0246  Urine Culture  Once,   URGENT       Question:  Indication  Answer:  Altered mental status (if no other cause identified)   12/01/22 0245   11/30/22 1759  Urinalysis, Routine w reflex microscopic Urine, Clean Catch  Once,   URGENT        11/30/22 1759            Vitals/Pain Today's Vitals   12/01/22 0900 12/01/22 0913 12/01/22 0913 12/01/22 1248  BP: (!) 112/42   136/63  Pulse: (!) 58   65  Resp: 16  20  Temp:    97.8 F (36.6 C)  TempSrc:    Oral  SpO2: 97%  98% 100%  PainSc:  0-No pain      Isolation Precautions Airborne and Contact precautions  Medications Medications  ciprofloxacin (CIPRO) IVPB 400 mg (0 mg Intravenous Stopped 12/01/22 0448)  0.9 %  sodium chloride  infusion ( Intravenous New Bag/Given 12/01/22 0647)  HYDROcodone-acetaminophen (NORCO/VICODIN) 5-325 MG per tablet 1 tablet (has no administration in time range)  rosuvastatin (CRESTOR) tablet 20 mg (20 mg Oral Given 12/01/22 0911)  rivaroxaban (XARELTO) tablet 5 mg (5 mg Oral Given 12/01/22 1033)  gabapentin (NEURONTIN) capsule 900 mg (has no administration in time range)  ondansetron (ZOFRAN) tablet 4 mg (has no administration in time range)    Or  ondansetron (ZOFRAN) injection 4 mg (has no administration in time range)  insulin aspart (novoLOG) injection 0-15 Units (has no administration in time range)  potassium chloride SA (KLOR-CON M) CR tablet 40 mEq (40 mEq Oral Given 12/01/22 1032)  hydrALAZINE (APRESOLINE) tablet 25 mg (has no administration in time range)  potassium chloride SA (KLOR-CON M) CR tablet 40 mEq (40 mEq Oral Given 12/01/22 0037)  sodium chloride 0.9 % bolus 1,000 mL (0 mLs Intravenous Stopped 12/01/22 0448)  potassium chloride 10 mEq in 100 mL IVPB (0 mEq Intravenous Stopped 12/01/22 0453)  sodium chloride 0.9 % bolus 500 mL (500 mLs Intravenous New Bag/Given 12/01/22 1035)    Mobility walks Low fall risk    R Recommendations: See Admitting Provider Note  Report given to:

## 2022-12-01 NOTE — ED Notes (Signed)
Assisted pt to [facility]. Pt had very large BM in BSC. Pt assisted back to bed. Pt resting comfortably. Call bell in reach.

## 2022-12-01 NOTE — ED Notes (Signed)
Attempted to collect 2nd set of blood cultures unsuccessfully. Phlebotomy requested to draw 2nd set.

## 2022-12-01 NOTE — ED Notes (Signed)
Called lab for Hgb A1C and LA labwork to be ran.

## 2022-12-01 NOTE — ED Provider Notes (Signed)
[facility] EMERGENCY DEPARTMENT Provider Note   CSN: [id number] Arrival date & time: 11/30/22  1622     History Chief Complaint  Patient presents with   Generalized Body Aches    Yvonne [name] is a 76 y.o. female.  HPI Patient presented to the ER with complaints of 1 week of feeling unwell and changes in cognition. Patient's daughter was present and provided some history as well as other daughter on the phone. Daughter on phone reported that she was with patient grocery shopping when she appeared to stare off and not respond to verbal commands even when looking at daughter for about 5 minutes. Based on daughter's report, no history of cardiac abnormalities or prior clots or strokes. Only surgery was right mastectomy for breast cancer. Denied any recent changes to any medications. Denies chest pain, shortness of breath, abdominal pain, headaches, fever, nausea, vomiting, or diarrhea. Some recent increase in difficulty with bowel movements, but reportedly still having daily BM.    Home Medications Prior to Admission medications   Medication Sig Start Date End Date Taking? Authorizing Provider  gabapentin (NEURONTIN) 300 MG capsule Take 900 mg by mouth at bedtime. 07/20/20  Yes [provider]  HYDROcodone-acetaminophen (NORCO/VICODIN) 5-325 MG tablet Take 1 tablet by mouth 3 (three) times daily as needed for moderate pain. 07/15/20  Yes [provider]  metFORMIN (GLUCOPHAGE) 500 MG tablet Take 250 mg by mouth 2 (two) times daily with a meal.   Yes [provider]  rivaroxaban (XARELTO) 2.5 MG TABS tablet Take 5 mg by mouth daily. 08/12/20  Yes [provider]  rosuvastatin (CRESTOR) 20 MG tablet Take 20 mg by mouth daily.   Yes [provider]  triamterene-hydrochlorothiazide (MAXZIDE-25) 37.5-25 MG tablet Take 1 tablet by mouth daily.   Yes [provider]  Vitamin D, Ergocalciferol, (DRISDOL) 1.25 MG (50000  UNIT) CAPS capsule Take 50,000 Units by mouth every 7 (seven) days. Sunday 07/15/20  Yes [provider]  Lancets (FREESTYLE) lancets daily. 07/14/20   [provider]      Allergies    Penicillins    Review of Systems   Review of Systems  Constitutional:  Positive for appetite change and fatigue. Negative for fever.  Cardiovascular:  Negative for chest pain.  Gastrointestinal:  Positive for constipation. Negative for abdominal pain, diarrhea, nausea and vomiting.  Musculoskeletal:  Negative for neck pain.  Neurological:  Negative for tremors, seizures, facial asymmetry, speech difficulty, weakness, light-headedness and headaches.  All other systems reviewed and are negative.   Physical Exam Updated Vital Signs BP 123/74   Pulse 63   Temp 98.2 F (36.8 C) (Oral)   Resp 16   SpO2 96%  Physical Exam Vitals and nursing note reviewed.  Constitutional:      Appearance: Normal appearance.  HENT:     Head: Normocephalic and atraumatic.  Eyes:     Conjunctiva/sclera: Conjunctivae normal.  Cardiovascular:     Rate and Rhythm: Normal rate and regular rhythm.  Pulmonary:     Effort: Pulmonary effort is normal.     Breath sounds: Normal breath sounds.  Abdominal:     General: Abdomen is flat. Bowel sounds are normal.     Palpations: Abdomen is soft.     Tenderness: There is no abdominal tenderness. There is no guarding or rebound.  Musculoskeletal:     Cervical back: Normal range of motion. No rigidity or tenderness.     Left lower leg: Normal.  Comments: Trace edema in left lower leg up to distal tibia.  Skin:    General: Skin is warm and dry.  Neurological:     Mental Status: She is alert.  Psychiatric:     Comments: Patient is not obviously delusion or hallucinating, but appears to have difficulty expressing thoughts which may be secondary to hearing difficulty.     ED Results / Procedures / Treatments   Labs (all labs ordered are listed, but only  abnormal results are displayed) Labs Reviewed  COMPREHENSIVE METABOLIC PANEL - Abnormal; Notable for the following components:      Result Value   Sodium 126 (*)    Potassium 2.7 (*)    Chloride 75 (*)    Glucose, Bld 137 (*)    Creatinine, Ser 1.68 (*)    Total Protein 8.2 (*)    AST 50 (*)    Total Bilirubin 1.5 (*)    GFR, Estimated 32 (*)    Anion gap 19 (*)    All other components within normal limits  CBC WITH DIFFERENTIAL/PLATELET - Abnormal; Notable for the following components:   WBC 12.3 (*)    RBC 5.26 (*)    Hemoglobin 15.5 (*)    Neutro Abs 7.9 (*)    Monocytes Absolute 1.1 (*)    All other components within normal limits  MAGNESIUM - Abnormal; Notable for the following components:   Magnesium 2.8 (*)    All other components within normal limits  URINALYSIS, ROUTINE W REFLEX MICROSCOPIC - Abnormal; Notable for the following components:   APPearance CLOUDY (*)    Hgb urine dipstick MODERATE (*)    Protein, ur 100 (*)    Leukocytes,Ua LARGE (*)    RBC / HPF >50 (*)    WBC, UA >50 (*)    Bacteria, UA FEW (*)    All other components within normal limits  TROPONIN I (HIGH SENSITIVITY) - Abnormal; Notable for the following components:   Troponin I (High Sensitivity) 19 (*)    All other components within normal limits  RESP PANEL BY RT-PCR (FLU A&B, COVID) ARPGX2  URINE CULTURE  CULTURE, BLOOD (ROUTINE X 2)  CULTURE, BLOOD (ROUTINE X 2)  URINALYSIS, ROUTINE W REFLEX MICROSCOPIC  COMPREHENSIVE METABOLIC PANEL  CBC WITH DIFFERENTIAL/PLATELET  C-REACTIVE PROTEIN  SEDIMENTATION RATE  PROCALCITONIN  TROPONIN I (HIGH SENSITIVITY)    EKG EKG Interpretation  Date/Time:  Monday November 30 2022 18:37:33 EST Ventricular Rate:  80 PR Interval:  178 QRS Duration: 102 QT Interval:  480 QTC Calculation: 553 R Axis:   -65 Text Interpretation: Critical Test Result: Long QTc Normal sinus rhythm Left anterior fascicular block Minimal voltage criteria for LVH, may be  normal variant ( Cornell product ) Nonspecific ST and T wave abnormality Prolonged QT Abnormal ECG When compared with ECG of 18-Apr-2012 10:44, PREVIOUS ECG IS PRESENT Confirmed by Gerlene Fee [phone]) on 11/30/2022 11:44:22 PM  Radiology CT HEAD WO CONTRAST (5MM)  Result Date: 12/01/2022 CLINICAL DATA:  Delirium EXAM: CT HEAD WITHOUT CONTRAST TECHNIQUE: Contiguous axial images were obtained from the base of the skull through the vertex without intravenous contrast. RADIATION DOSE REDUCTION: This exam was performed according to the departmental dose-optimization program which includes automated exposure control, adjustment of the mA and/or kV according to patient size and/or use of iterative reconstruction technique. COMPARISON:  None Available. FINDINGS: Brain: There is no mass, hemorrhage or extra-axial collection. There is generalized atrophy without lobar predilection. Hypodensity of the white matter is most  commonly associated with chronic microvascular disease. Vascular: No abnormal hyperdensity of the major intracranial arteries or dural venous sinuses. No intracranial atherosclerosis. Skull: The visualized skull base, calvarium and extracranial soft tissues are normal. Sinuses/Orbits: No fluid levels or advanced mucosal thickening of the visualized paranasal sinuses. No mastoid or middle ear effusion. The orbits are normal. IMPRESSION: 1. No acute intracranial abnormality. 2. Generalized atrophy and findings of chronic microvascular disease. Electronically Signed   By: Ulyses Jarred M.D.   On: 12/01/2022 01:47   DG Chest 2 View  Result Date: 11/30/2022 CLINICAL DATA:  Weakness EXAM: CHEST - 2 VIEW COMPARISON:  04/26/2012 FINDINGS: Previously seen left chest wall port has been removed. Cardiac shadow is within normal limits. Aortic calcifications are seen. The lungs are clear bilaterally. Degenerative change of the thoracic spine is noted. IMPRESSION: No active cardiopulmonary disease.  Electronically Signed   By: Inez Catalina M.D.   On: 11/30/2022 19:56    Procedures Procedures   Medications Ordered in ED Medications  ciprofloxacin (CIPRO) IVPB 400 mg (0 mg Intravenous Stopped 12/01/22 0448)  0.9 %  sodium chloride infusion (has no administration in time range)  potassium chloride SA (KLOR-CON M) CR tablet 40 mEq (40 mEq Oral Given 12/01/22 0037)  sodium chloride 0.9 % bolus 1,000 mL (0 mLs Intravenous Stopped 12/01/22 0448)  potassium chloride 10 mEq in 100 mL IVPB (0 mEq Intravenous Stopped 12/01/22 0453)    ED Course/ Medical Decision Making/ A&P Clinical Course as of 12/01/22 4098  Tue Dec 01, 2022  0106 Urinalysis, Routine w reflex microscopic Urine, Clean Catch [OZ]    Clinical Course User Index [OZ] Luvenia Heller, PA-C                           Medical Decision Making Amount and/or Complexity of Data Reviewed Labs: ordered. Decision-making details documented in ED Course. Radiology: ordered.  Risk Prescription drug management. Decision regarding hospitalization.   This patient presents to the ED for concern of fatigue. Differential diagnosis includes UTI, pyelonephritis, AKI, stroke, meningitis, viral URI.   Additional history obtained:  Additional history obtained from daughter in the room and daughter on phone.   Lab Tests:  I Ordered, and personally interpreted labs.  The pertinent results include: Potassium of 2.7, creatinine of 1.68,   Imaging Studies ordered:  I ordered imaging studies including chest x-ray, CT head I independently visualized and interpreted imaging which showed no active cardiopulmonary disease on chest x-ray. I agree with the radiologist interpretation   Medicines ordered and prescription drug management:  I ordered medication including NS fluid bolus, potassium chloride for dehydration.  Ciprofloxacin for UTI. I have reviewed the patients home medicines and have made adjustments as needed   Problem List /  ED Course:  Patient presented to the ER following approximately 1 week of weakness with slight increase in confusion.  Patient's daughter reports that she appeared to have some difficulty for short period of time speaking's or recognizing her.  This was a sudden drastic change from her baseline she is typically been fairly active and cognitively showing no signs of decline.  Lab work in particular was concerning for a potassium of 2.7, a bump in the creatinine up to 1.68, concomitant infection present on urinalysis, and QTc on EKG of 553.  In particular the combination of the decreased potassium and increased QTc, inpatient admission for potassium replacement as well as UTI IV antibiotic treatment is recommended.  Dr. Sedonia Small discussed inpatient admission with plan to admit.  Discussed this plan with the patient and her daughter and she verbalized understanding was agreeable to plan.   Final Clinical Impression(s) / ED Diagnoses Final diagnoses:  Vomiting, unspecified vomiting type, unspecified whether nausea present  Hypokalemia  Urinary tract infection without hematuria, site unspecified    Rx / DC Orders ED Discharge Orders     None         Luvenia Heller, PA-C 12/01/22 0732    Maudie Flakes, MD 12/02/22 [phone]

## 2022-12-01 NOTE — ED Notes (Signed)
Assisted pt to [facility] to have BM. Assisted pt back to bed. Pt in bed resting comfortably. Call bell within reach.

## 2022-12-01 NOTE — Progress Notes (Signed)
Repeat K=2.8 this afternoon, Phos=2.4  Ordered KCL 40 mEq x3 doses and Phos replacement

## 2022-12-02 DIAGNOSIS — Z170 Estrogen receptor positive status [ER+]: Secondary | ICD-10-CM

## 2022-12-02 DIAGNOSIS — E785 Hyperlipidemia, unspecified: Secondary | ICD-10-CM | POA: Diagnosis not present

## 2022-12-02 DIAGNOSIS — I82501 Chronic embolism and thrombosis of unspecified deep veins of right lower extremity: Secondary | ICD-10-CM | POA: Diagnosis present

## 2022-12-02 DIAGNOSIS — Z86718 Personal history of other venous thrombosis and embolism: Secondary | ICD-10-CM

## 2022-12-02 DIAGNOSIS — A419 Sepsis, unspecified organism: Secondary | ICD-10-CM | POA: Diagnosis not present

## 2022-12-02 DIAGNOSIS — N179 Acute kidney failure, unspecified: Secondary | ICD-10-CM

## 2022-12-02 DIAGNOSIS — C50411 Malignant neoplasm of upper-outer quadrant of right female breast: Secondary | ICD-10-CM | POA: Diagnosis not present

## 2022-12-02 DIAGNOSIS — I159 Secondary hypertension, unspecified: Secondary | ICD-10-CM

## 2022-12-02 HISTORY — DX: Personal history of other venous thrombosis and embolism: Z86.718

## 2022-12-02 LAB — URINE CULTURE

## 2022-12-02 LAB — PHOSPHORUS: Phosphorus: 1.9 mg/dL — ABNORMAL LOW (ref 2.5–4.6)

## 2022-12-02 LAB — GLUCOSE, CAPILLARY
Glucose-Capillary: 102 mg/dL — ABNORMAL HIGH (ref 70–99)
Glucose-Capillary: 98 mg/dL (ref 70–99)
Glucose-Capillary: 97 mg/dL (ref 70–99)
Glucose-Capillary: 130 mg/dL — ABNORMAL HIGH (ref 70–99)

## 2022-12-02 LAB — BASIC METABOLIC PANEL
Anion gap: 8 (ref 5–15)
BUN: 11 mg/dL (ref 8–23)
CO2: 29 mmol/L (ref 22–32)
Calcium: 8.6 mg/dL — ABNORMAL LOW (ref 8.9–10.3)
Chloride: 97 mmol/L — ABNORMAL LOW (ref 98–111)
Creatinine, Ser: 1.16 mg/dL — ABNORMAL HIGH (ref 0.44–1.00)
GFR, Estimated: 49 mL/min — ABNORMAL LOW (ref >60)
Glucose, Bld: 122 mg/dL — ABNORMAL HIGH (ref 70–99)
Potassium: 3.1 mmol/L — ABNORMAL LOW (ref 3.5–5.1)
Sodium: 134 mmol/L — ABNORMAL LOW (ref 135–145)

## 2022-12-02 LAB — CBC
HCT: 35.3 % — ABNORMAL LOW (ref 36.0–46.0)
Hemoglobin: 12.3 g/dL (ref 12.0–15.0)
MCH: 29.6 pg (ref 26.0–34.0)
MCHC: 34.8 g/dL (ref 30.0–36.0)
MCV: 85.1 fL (ref 80.0–100.0)
Platelets: 176 10*3/uL (ref 150–400)
RBC: 4.15 MIL/uL (ref 3.87–5.11)
RDW: 14.2 % (ref 11.5–15.5)
WBC: 7.1 10*3/uL (ref 4.0–10.5)
nRBC: 0 % (ref 0.0–0.2)

## 2022-12-02 MED ORDER — MAGNESIUM SULFATE 2 GM/50ML IV SOLN: 2.0000 g | Freq: Once | INTRAVENOUS | Status: DC

## 2022-12-02 MED ORDER — POTASSIUM CHLORIDE CRYS ER 20 MEQ PO TBCR
40.0000 meq | EXTENDED_RELEASE_TABLET | ORAL | Status: AC
  Administered 2022-12-02 (×2): 40 meq via ORAL
  Filled 2022-12-02 (×2): qty 2

## 2022-12-02 NOTE — Assessment & Plan Note (Addendum)
CKD stage 3a with baseline cr at 1,16 to 1,20  Hypokalemia. HypoNatremia, hypophosphatemia   Patient responded well to medical therapy with IV fluids and electrolytes were corrected, with Kcl, K phos and Mag sulfate.   At the time of her discharge her K is 3,5, Mg 2,0 and serum bicarbonate at 24. Serum cr 1,3 and Na 138.  Hold diuretic therapy and continue blood pressure control with amlodipine.

## 2022-12-02 NOTE — Hospital Course (Addendum)
Yvonne [name] was admitted to the hospital with the working diagnosis of severe hypokalemia in the setting or urinary tract infection.   76 yo female with the past medical history of hypertension, breast cancer sp mastectomy, DVT, T2DM, and dyslipidemia who presented with weakness. Reported one week of feeling lethargy, lack of energy and muscle aches. No dysuria or increased urinary frequency but positive urinary tenesmus. On her initial physical examination her blood pressure was 125/55, HR 67, RR 16 and 02 saturation 97%, dry mucous membranes, heart with S1 and S2 present and rhythmic, lungs with no wheezing or rales, abdomen with no distention, no lower extremity edema.   Na 126, K 2,7, Cl 75, bicarbonate 32, glucose 137, bun 17 cr 1,68  AST 50 ALT 26  Wbc 12.3 hgb 15.5 plt 295  Sars covid 19 negative Urine analysis SG 1,009, >50 rbc, >50 wbc, 100 protein, pH 6.0  Head CT with no acute changes.  Chest radiograph with no infiltrates.  EKG 80 bpm, left axis deviation, left anterior fascicular block, qtc 553, sinus rhythm with poor R R wave progression, no significant ST segment or T wave changes.   Patient was placed on IV fluids and IV antibiotic therapy and had her electrolytes  corrected.   12/14 transitioned to po antibiotic therapy, possible discharge home tomorrow.  12/15 renal function and electrolytes are stable, she has responded well to antibiotic therapy. Changed diuretic to  dihydropyridine calcium channel blocker to prevent further electrolyte abnormalities.

## 2022-12-02 NOTE — Plan of Care (Signed)
  Problem: Education: Goal: Knowledge of General Education information will improve Description: Including pain rating scale, medication(s)/side effects and non-pharmacologic comfort measures Outcome: Completed/Met

## 2022-12-02 NOTE — Plan of Care (Signed)

## 2022-12-02 NOTE — Assessment & Plan Note (Addendum)
Blood pressure 116 to 159 mmHg Considering electrolyte abnormalities, continue blood pressure control with amlodipine (increase dose from 5 mg daily to 10 mg daily). Follow up blood pressure as outpatient.  Discontinue diuretic therapy.

## 2022-12-02 NOTE — Progress Notes (Addendum)
Progress Note   Patient: Yvonne [name] [id number]:[id number] DOB: 17-Dec-1946 DOA: 11/30/2022     1 DOS: the patient was seen and examined on 12/02/2022   Brief hospital course: Yvonne [name] was admitted to the hospital with the working diagnosis of severe hypokalemia.   76 yo female with the past medical history of hypertension, breast cancer sp mastectomy, DVT, T2DM, and dyslipidemia who presented with weakness. Reported one week of feeling lethargy, lack of energy and muscle aches. No dysuria or increased urinary frequency but positive tenesmus. On her initial physical examination her blood pressure was 125/55, HR 67, RR 16 and 02 saturation 97%, dry mucous membranes, heart with S1 and S2 present and rhythmic, lungs with no wheezing or rales, abdomen with no distention, no lower extremity edema.   Na 126, K 2,7, Cl 75, bicarbonate 32, glucose 137, bun 17 cr 1,68  AST 50 ALT 26  Wbc 12.3 hgb 15.5 plt 295  Sars covid 19 negative Urine analysis SG 1,009, >50 rbc, >50 wbc, 100 protein, pH 6.0  Head CT with no acute changes.  Chest radiograph with no infiltrates.  EKG 80 bpm, left axis deviation, left anterior fascicular block, qtc 553, sinus rhythm with poor R R wave progression, no significant ST segment or T wave changes.   Patient was placed on IV antibiotic therapy and had electrolyte  correction.   Assessment and Plan: * Sepsis due to urinary tract infection ([place]) Patient has been afebrile, wbc is 7,1.   Patient has been placed on ciprofloxacin for antibiotic therapy. She had allergy to penicillin in the past.  If no allergy history to cephalosporins will change antibiotic therapy to limit exposure to flouroquinolones   AKI (acute kidney injury) ([place]) Hypokalemia. HypoNatremia, hypophosphatemia   Renal function with serum cr at 1,16 with K at 3,1 and serum bicarbonate at 29. Na 134   Plan to continue hydration with isotonic saline at 75 ml per hr Continue Kcl 80 meq in 2  divided doses.  Check Mg in am.  Liberate diet to regular.   Hyperlipidemia Continue statin therapy.   Hypertension Systolic blood pressure 101 to 115 mmHg.  Plant to continue hydration with isotonic saline.  Close blood pressure monitoring.   Malignant neoplasm of upper-outer quadrant of right breast in female, estrogen receptor positive ([place]) Follow up as outpatient.   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) Continue anticoagulation with rivaroxaban.         Subjective: Patient with no chest pain or dyspnea, continue to be very weak and deconditioned   Physical Exam: Vitals:   12/01/22 1745 12/01/22 2135 12/02/22 0500 12/02/22 0919  BP: 135/71 115/62 (!) 120/57 (!) 110/43  Pulse: 72 80 70 72  Resp: '18 17 20 18  '$ Temp: 98 F (36.7 C) 98 F (36.7 C) 98 F (36.7 C) 98.7 F (37.1 C)  TempSrc: Oral Oral Oral Oral  SpO2: 98% 99% 99% 98%   Neurology awake and alert ENT with mild pallor Cardiovascular with S1 and S2 present and rhythmic Respiratory with no rales or wheezing Abdomen with no distention No lower extremity edema  Data Reviewed:    Family Communication: I spoke with patient's daughter at the bedside, we talked in detail about patient's condition, plan of care and prognosis and all questions were addressed.    Disposition: Status is: Inpatient Remains inpatient appropriate because: IV antibiotic therapy   Planned Discharge Destination: Home  Author: Tawni Millers, MD 12/02/2022 2:34 PM  For on call review [email or web address].

## 2022-12-02 NOTE — Assessment & Plan Note (Signed)
Follow up as outpatient.  

## 2022-12-02 NOTE — Assessment & Plan Note (Signed)
Continue anticoagulation with rivaroxaban 

## 2022-12-02 NOTE — Assessment & Plan Note (Addendum)
Sepsis ruled out.  Urine culture with multiple species.  Patient has responded well to antibiotic therapy, her wbc is 7,5 and she has remained afebrile.   She had allergic reaction to penicillin when she was pregnant many yeats ago, reaction mouth swelling.   Initially treated with IV ciprofloxacin and then successfully transitioned to oral cephalosporin with cephalexin.  Continue antibiotic therapy for 3 more days.

## 2022-12-02 NOTE — Assessment & Plan Note (Addendum)
Her glucose remained well controlled during her hospitalization. Continue glucose control with metformin.   Continue statin therapy.

## 2022-12-03 DIAGNOSIS — I825Y1 Chronic embolism and thrombosis of unspecified deep veins of right proximal lower extremity: Secondary | ICD-10-CM

## 2022-12-03 DIAGNOSIS — E1169 Type 2 diabetes mellitus with other specified complication: Secondary | ICD-10-CM

## 2022-12-03 LAB — RENAL FUNCTION PANEL
Albumin: 2.7 g/dL — ABNORMAL LOW (ref 3.5–5.0)
Anion gap: 10 (ref 5–15)
BUN: 11 mg/dL (ref 8–23)
CO2: 23 mmol/L (ref 22–32)
Calcium: 8.6 mg/dL — ABNORMAL LOW (ref 8.9–10.3)
Chloride: 106 mmol/L (ref 98–111)
Creatinine, Ser: 1.16 mg/dL — ABNORMAL HIGH (ref 0.44–1.00)
GFR, Estimated: 49 mL/min — ABNORMAL LOW (ref >60)
Glucose, Bld: 109 mg/dL — ABNORMAL HIGH (ref 70–99)
Phosphorus: 1.9 mg/dL — ABNORMAL LOW (ref 2.5–4.6)
Potassium: 3.5 mmol/L (ref 3.5–5.1)
Sodium: 139 mmol/L (ref 135–145)

## 2022-12-03 LAB — CBC
HCT: 34.9 % — ABNORMAL LOW (ref 36.0–46.0)
Hemoglobin: 11.6 g/dL — ABNORMAL LOW (ref 12.0–15.0)
MCH: 29.4 pg (ref 26.0–34.0)
MCHC: 33.2 g/dL (ref 30.0–36.0)
MCV: 88.6 fL (ref 80.0–100.0)
Platelets: 171 10*3/uL (ref 150–400)
RBC: 3.94 MIL/uL (ref 3.87–5.11)
RDW: 14.7 % (ref 11.5–15.5)
WBC: 7.5 10*3/uL (ref 4.0–10.5)
nRBC: 0 % (ref 0.0–0.2)

## 2022-12-03 LAB — GLUCOSE, CAPILLARY
Glucose-Capillary: 108 mg/dL — ABNORMAL HIGH (ref 70–99)
Glucose-Capillary: 96 mg/dL (ref 70–99)
Glucose-Capillary: 96 mg/dL (ref 70–99)
Glucose-Capillary: 124 mg/dL — ABNORMAL HIGH (ref 70–99)

## 2022-12-03 MED ORDER — AMLODIPINE BESYLATE 5 MG PO TABS
5.0000 mg | ORAL_TABLET | Freq: Every day | ORAL | Status: DC
  Administered 2022-12-03 – 2022-12-04 (×2): 5 mg via ORAL
  Filled 2022-12-03 (×2): qty 1

## 2022-12-03 MED ORDER — METFORMIN HCL 500 MG PO TABS
250.0000 mg | ORAL_TABLET | Freq: Two times a day (BID) | ORAL | Status: DC
  Administered 2022-12-03 – 2022-12-04 (×2): 250 mg via ORAL
  Filled 2022-12-03 (×2): qty 1

## 2022-12-03 MED ORDER — K PHOS MONO-SOD PHOS DI & MONO 155-852-130 MG PO TABS
250.0000 mg | ORAL_TABLET | Freq: Once | ORAL | Status: AC
  Administered 2022-12-03: 250 mg via ORAL
  Filled 2022-12-03: qty 1

## 2022-12-03 MED ORDER — POTASSIUM CHLORIDE CRYS ER 20 MEQ PO TBCR
40.0000 meq | EXTENDED_RELEASE_TABLET | Freq: Once | ORAL | Status: AC
  Administered 2022-12-03: 40 meq via ORAL
  Filled 2022-12-03: qty 2

## 2022-12-03 MED ORDER — CEPHALEXIN 250 MG PO CAPS
250.0000 mg | ORAL_CAPSULE | Freq: Three times a day (TID) | ORAL | Status: DC
  Administered 2022-12-03 – 2022-12-04 (×3): 250 mg via ORAL
  Filled 2022-12-03 (×5): qty 1

## 2022-12-03 NOTE — Progress Notes (Signed)
Initial Nutrition Assessment  DOCUMENTATION CODES:   Not applicable  INTERVENTION:  - Continue current diet order.   NUTRITION DIAGNOSIS:   No nutrition diagnosis at this time.  GOAL:   Patient will meet greater than or equal to 90% of their needs  MONITOR:   PO intake  REASON FOR ASSESSMENT:   Consult, Malnutrition Screening Tool Assessment of nutrition requirement/status  ASSESSMENT:   76 y.o. female admits related to generalized weakness. PMH includes: HTN, breast cancer s/p mastectomy, DVT, T2DM, IIDM, HLD, diabetic neuropathy. Pt is currently receiving medical management for sepsis due to UTI.  Meds reviewed: metformin. Labs reviewed: Phos low.   Pt was sleeping at time of assessment. Pt would not wake to sound of voice or knock on the door. Per record, pt has eaten 95-100% of her meals per record. The pt is likely meeting her needs at this time. No significant wt loss per record. RD will continue to monitor PO intakes.   Diet Order:   Diet Order             Diet heart healthy/carb modified Room service appropriate? Yes; Fluid consistency: Thin  Diet effective now                   EDUCATION NEEDS:   Not appropriate for education at this time  Skin:  Skin Assessment: Reviewed RN Assessment  Last BM:  12/02/22  Height:   Ht Readings from Last 1 Encounters:  09/12/20 '5\' 6"'$  (1.676 m)    Weight:   Wt Readings from Last 1 Encounters:  09/12/20 82.6 kg    Ideal Body Weight:     BMI:  There is no height or weight on file to calculate BMI.  Estimated Nutritional Needs:   Kcal:  2065-2230 kcals  Protein:  100-115 gm  Fluid:  >/= 2 L  Thalia Bloodgood, RD, LDN, CNSC.

## 2022-12-03 NOTE — Progress Notes (Signed)
Progress Note   Patient: Yvonne [name] [id number]:[id number] DOB: Jul 13, 1946 DOA: 11/30/2022     2 DOS: the patient was seen and examined on 12/03/2022   Brief hospital course: Yvonne [name] was admitted to the hospital with the working diagnosis of severe hypokalemia.   76 yo female with the past medical history of hypertension, breast cancer sp mastectomy, DVT, T2DM, and dyslipidemia who presented with weakness. Reported one week of feeling lethargy, lack of energy and muscle aches. No dysuria or increased urinary frequency but positive tenesmus. On her initial physical examination her blood pressure was 125/55, HR 67, RR 16 and 02 saturation 97%, dry mucous membranes, heart with S1 and S2 present and rhythmic, lungs with no wheezing or rales, abdomen with no distention, no lower extremity edema.   Na 126, K 2,7, Cl 75, bicarbonate 32, glucose 137, bun 17 cr 1,68  AST 50 ALT 26  Wbc 12.3 hgb 15.5 plt 295  Sars covid 19 negative Urine analysis SG 1,009, >50 rbc, >50 wbc, 100 protein, pH 6.0  Head CT with no acute changes.  Chest radiograph with no infiltrates.  EKG 80 bpm, left axis deviation, left anterior fascicular block, qtc 553, sinus rhythm with poor R R wave progression, no significant ST segment or T wave changes.   Patient was placed on IV antibiotic therapy and had electrolyte  correction.   12/14 transitioned to po antibiotic therapy, possible discharge home tomorrow.   Assessment and Plan: * Sepsis due to urinary tract infection ([place]) Clinically improving.  Wbc is 7,5  Urine culture with multiple species.   Patient has infiltrated her IV, clinically is improving, will transition to oral antibiotic therapy. She had allergic reaction to penicillin when she was pregnant many yeats ago, reaction mouth swelling.  She is in agreement in trying cephalexin, better side effect profile than ciprofloxacin.  Low probability for cross reaction with penicillin.  If good toleration  will plan to discharge patient home tomorrow. Discontinue IV fluids.   AKI (acute kidney injury) ([place]) Hypokalemia. HypoNatremia, hypophosphatemia   PO intake has improved, renal function today with serum cr at  1/16 with K at 3,5 and serum bicarbonate at 23. P 1,9  Plan to continue electrolyte correction with oral Kcl and K P.    Type 2 diabetes mellitus with hyperlipidemia (HCC) Glucose has been well controlled, fasting glucose today is 109. Capillary has been 97, 130, 108 and 96.  Plan to discontinue insulin therapy, check capillary glucose as needed. Resume metformin.   Continue statin therapy.   Hypertension Systolic blood pressure 220 to 111 mmHg Discontinue IV fluids.  Considering electrolyte abnormalities, will resume blood pressure control with amlodipine.  Discontinue diuretic therapy.    Malignant neoplasm of upper-outer quadrant of right breast in female, estrogen receptor positive ([place]) Follow up as outpatient.   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) Continue anticoagulation with rivaroxaban.         Subjective: Patient is feeling better, no chest pain or dyspnea, her IV has infiltrated.   Physical Exam: Vitals:   12/02/22 1658 12/02/22 2030 12/03/22 0355 12/03/22 0855  BP: (!) 148/69 130/70 124/63 111/85  Pulse: 82 69 60 72  Resp: '20 18 16 20  '$ Temp: 98 F (36.7 C) 98 F (36.7 C) 97.9 F (36.6 C) 98.3 F (36.8 C)  TempSrc: Oral  Oral Oral  SpO2: 99% 99% 98% 100%   Neurology awake and alert ENT with mild pallor Cardiovascular with S1 and  S2 present and rhythmic with no gallops or murmurs Respiratory with no rales or wheezing Abdomen with no distention  No lower extremity edema  Data Reviewed:    Family Communication: I spoke with patient's husband at the bedside, we talked in detail about patient's condition, plan of care and prognosis and all questions were addressed.   Disposition: Status is:  Inpatient Remains inpatient appropriate because: antibiotic therapy, monitor response to cephalosporin   Planned Discharge Destination: Home    Author: Tawni Millers, MD 12/03/2022 12:57 PM  For on call review [email or web address].

## 2022-12-03 NOTE — Progress Notes (Signed)
  Transition of Care [facility]) Screening Note   Patient Details  Name: Yvonne [name] Date of Birth: 08/04/1946   Transition of Care [facility]) CM/SW Contact:    Tom-Johnson, Renea Ee, RN Phone Number: 12/03/2022, 10:20 AM  Patient is admitted for sepsis 2/2 UTI. Currently on IV abx.  From home with husband, has four supportive children. Has a cane at home. Does not drive, husband drives to and from appointments and errands.  PCP is Simona Huh, NP and uses [facility] on [address].  Transition of Care Department [facility]) has reviewed patient and no TOC needs or recommendations have been identified at this time. TOC will continue to monitor patient advancement through interdisciplinary progression rounds. If new patient transition needs arise, please place a TOC consult.

## 2022-12-03 NOTE — Progress Notes (Signed)
Physical Therapy Evaluation Patient Details Name: Yvonne [name] MRN: [id number] DOB: 1946/09/01 Today's Date: 12/03/2022  History of Present Illness  76 y.o. female admitted 12/11/523 with hypokalemia, found to have Sepsis due to urinary tract infection, and AKI.  past medical history of hypertension, breast cancer sp mastectomy, DVT, T2DM, and dyslipidemia.  Clinical Impression  Pt admitted with above diagnosis. Mobilizing well, ambulating in hallway with supervision 200 ft, use of SPC. Pt feels near baseline. Safely completed stair training today. Husband assists with IADLs at home. Adequte for d/c from PT standpoint when medically ready as she will have enough assist as needed at home. Will continue to follow and progress independence further until d/c.          Recommendations for follow up therapy are one component of a multi-disciplinary discharge planning process, led by the attending physician.  Recommendations may be updated based on patient status, additional functional criteria and insurance authorization.  Follow Up Recommendations Outpatient PT      Assistance Recommended at Discharge Intermittent Supervision/Assistance  Patient can return home with the following  Help with stairs or ramp for entrance;Assist for transportation;Assistance with cooking/housework    Equipment Recommendations None recommended by PT  Recommendations for Other Services       Functional Status Assessment Patient has had a recent decline in their functional status and demonstrates the ability to make significant improvements in function in a reasonable and predictable amount of time.     Precautions / Restrictions Precautions Precautions: None Restrictions Weight Bearing Restrictions: No      Mobility  Bed Mobility Overal bed mobility: Modified Independent             General bed mobility comments: extra time no assist in/out of bed.    Transfers Overall transfer level:  Modified independent Equipment used: Straight cane               General transfer comment: Good stability once upright, no assist needed.    Ambulation/Gait Ambulation/Gait assistance: Supervision Gait Distance (Feet): 200 Feet Assistive device: None, Straight cane Gait Pattern/deviations: Step-through pattern, Decreased stride length, Antalgic Gait velocity: decreased Gait velocity interpretation: <1.8 ft/sec, indicate of risk for recurrent falls   General Gait Details: Increased sway with gait, using SPC for light stability, (set very high however pt prefers this and declines adjustments.) No overt LOB noted while ambulating in hallways, cues for symmetry. Ambulates short distances in room without AD increased antalgic pattern and sway but without overt LOB.  Stairs Stairs: Yes Stairs assistance: Supervision Stair Management: One rail Right, Step to pattern, Forwards, With cane Number of Stairs: 6 General stair comments: Safely completed stair training at supervision level. Good strength with use for rail for support. Cues for technique, using SPC in opposite hand for control. Reports husband can supervise on stairs at home.  Wheelchair Mobility    Modified Rankin (Stroke Patients Only)       Balance                                             Pertinent Vitals/Pain Pain Assessment Pain Assessment: No/denies pain    Home Living Family/patient expects to be discharged to:: Private residence Living Arrangements: Spouse/significant other Available Help at Discharge: Family;Available 24 hours/day Type of Home: House Home Access: Stairs to enter Entrance Stairs-Rails: Right;Left;Can reach both Entrance Stairs-Number of Steps:  3   Home Layout: One level Home Equipment: Cane - single point      Prior Function Prior Level of Function : Needs assist       Physical Assist : Mobility (physical)     Mobility Comments: Reports she uses SPC as  needed for ambulating in home. Husband does all IADLs for patient including cooking/cleaning/driving.       Hand Dominance        Extremity/Trunk Assessment   Upper Extremity Assessment Upper Extremity Assessment: Defer to OT evaluation    Lower Extremity Assessment Lower Extremity Assessment: Generalized weakness       Communication   Communication: No difficulties  Cognition Arousal/Alertness: Awake/alert Behavior During Therapy: WFL for tasks assessed/performed Overall Cognitive Status: Within Functional Limits for tasks assessed                                          General Comments      Exercises     Assessment/Plan    PT Assessment Patient needs continued PT services  PT Problem List Decreased strength;Decreased activity tolerance;Decreased balance;Decreased mobility;Decreased range of motion;Obesity       PT Treatment Interventions      PT Goals (Current goals can be found in the Care Plan section)  Acute Rehab PT Goals Patient Stated Goal: go home tomorrow PT Goal Formulation: With patient Time For Goal Achievement: 12/10/22 Potential to Achieve Goals: Good    Frequency       Co-evaluation               AM-PAC PT "6 Clicks" Mobility  Outcome Measure Help needed turning from your back to your side while in a flat bed without using bedrails?: None Help needed moving from lying on your back to sitting on the side of a flat bed without using bedrails?: None Help needed moving to and from a bed to a chair (including a wheelchair)?: A Little Help needed standing up from a chair using your arms (e.g., wheelchair or bedside chair)?: A Little Help needed to walk in hospital room?: A Little Help needed climbing 3-5 steps with a railing? : A Little 6 Click Score: 20    End of Session Equipment Utilized During Treatment: Gait belt Activity Tolerance: Patient tolerated treatment well Patient left: in bed;with call bell/phone  within reach;with bed alarm set;with nursing/sitter in room Nurse Communication: Mobility status PT Visit Diagnosis: Other abnormalities of gait and mobility (R26.89);Muscle weakness (generalized) (M62.81);Difficulty in walking, not elsewhere classified (R26.2)    Time: 6967-8938 PT Time Calculation (min) (ACUTE ONLY): 17 min   Charges:   PT Evaluation $PT Eval Low Complexity: 1 Low          Candie Mile, PT, DPT Physical Therapist Acute Rehabilitation Services [place]   Ellouise Newer 12/03/2022, 4:32 PM

## 2022-12-04 ENCOUNTER — Other Ambulatory Visit (HOSPITAL_COMMUNITY): Payer: Self-pay

## 2022-12-04 DIAGNOSIS — I159 Secondary hypertension, unspecified: Secondary | ICD-10-CM | POA: Diagnosis not present

## 2022-12-04 DIAGNOSIS — E1169 Type 2 diabetes mellitus with other specified complication: Secondary | ICD-10-CM | POA: Diagnosis not present

## 2022-12-04 DIAGNOSIS — A419 Sepsis, unspecified organism: Secondary | ICD-10-CM | POA: Diagnosis not present

## 2022-12-04 DIAGNOSIS — N179 Acute kidney failure, unspecified: Secondary | ICD-10-CM | POA: Diagnosis not present

## 2022-12-04 LAB — BASIC METABOLIC PANEL
Anion gap: 11 (ref 5–15)
BUN: 13 mg/dL (ref 8–23)
CO2: 24 mmol/L (ref 22–32)
Calcium: 9.3 mg/dL (ref 8.9–10.3)
Chloride: 103 mmol/L (ref 98–111)
Creatinine, Ser: 1.33 mg/dL — ABNORMAL HIGH (ref 0.44–1.00)
GFR, Estimated: 42 mL/min — ABNORMAL LOW (ref >60)
Glucose, Bld: 112 mg/dL — ABNORMAL HIGH (ref 70–99)
Potassium: 3.5 mmol/L (ref 3.5–5.1)
Sodium: 138 mmol/L (ref 135–145)

## 2022-12-04 LAB — GLUCOSE, CAPILLARY
Glucose-Capillary: 121 mg/dL — ABNORMAL HIGH (ref 70–99)
Glucose-Capillary: 88 mg/dL (ref 70–99)

## 2022-12-04 LAB — MAGNESIUM: Magnesium: 2 mg/dL (ref 1.7–2.4)

## 2022-12-04 MED ORDER — AMLODIPINE BESYLATE 10 MG PO TABS
10.0000 mg | ORAL_TABLET | Freq: Every day | ORAL | 0 refills | Status: DC
  Filled 2022-12-04: qty 30, 30d supply, fill #0

## 2022-12-04 MED ORDER — CEPHALEXIN 250 MG PO CAPS
250.0000 mg | ORAL_CAPSULE | Freq: Three times a day (TID) | ORAL | 0 refills | Status: AC
  Filled 2022-12-04: qty 9, 3d supply, fill #0

## 2022-12-04 NOTE — TOC Transition Note (Signed)
Transition of Care [facility]) - CM/SW Discharge Note   Patient Details  Name: Yvonne [name] MRN: [id number] Date of Birth: 1946-08-20  Transition of Care [facility]) CM/SW Contact:  Tom-Johnson, Renea Ee, RN Phone Number: 12/04/2022, 11:57 AM   Clinical Narrative:     Patient is scheduled for discharge today. Outpatient PT referral info on AVS. Family at bedside and will transport at discharge. List of PCP from Medicare.gov given to patient and CM notified patient to call her insurance and asked for a list of PCP within their network. No other needs identified at this time. No further TOC needs noted.    Final next level of care: OP Rehab Barriers to Discharge: Barriers Resolved   Patient Goals and CMS Choice Patient states their goals for this hospitalization and ongoing recovery are:: To return home CMS Medicare.gov Compare Post Acute Care list provided to:: Patient Choice offered to / list presented to : NA  Discharge Placement                Patient to be transferred to facility by: Family      Discharge Plan and Services   Discharge Planning Services: CM Consult Post Acute Care Choice: NA          DME Arranged: N/A DME Agency: NA       HH Arranged: NA HH Agency: NA        Social Determinants of Health (SDOH) Interventions Transportation Interventions: Intervention Not Indicated, Inpatient TOC, Patient Resources (Friends/Family)   Readmission Risk Interventions     No data to display

## 2022-12-04 NOTE — Progress Notes (Signed)
PT Cancellation Note  Patient Details Name: Yvonne [name] MRN: [id number] DOB: 11-07-1946   Cancelled Treatment:    Reason Eval/Treat Not Completed: Patient declined, no reason specified  Politely declines PT services this morning. States she felt comfortable with mobility after evaluation and tx yesterday. Waiting for d/c this morning and eager to go home.  Candie Mile, PT, DPT Physical Therapist Acute Rehabilitation Services [place] [facility] 12/04/2022, 11:03 AM

## 2022-12-04 NOTE — Care Management Important Message (Signed)
Important Message  Patient Details  Name: Yvonne [name] MRN: [id number] Date of Birth: [birth year]   Medicare Important Message Given:  Yes     [name] 12/04/2022, 3:07 PM

## 2022-12-04 NOTE — Discharge Summary (Addendum)
Physician Discharge Summary   Patient: Yvonne [name] MRN: [id number] DOB: 01-Feb-1946  Admit date:     11/30/2022  Discharge date: 12/04/22  Discharge Physician: Jimmy Picket Yvonne [name]   PCP: Simona Huh, NP   Recommendations at discharge:    Continue blood pressure control amlodipine, discontinue HCTZ to prevent electrolyte abnormalities. Continue with cephalexin for 3 days for urine infection.  Follow up renal function and electrolytes in 7 days Follow up with Primary care in 7 to 10 days.   Discharge Diagnoses: Principal Problem:   Sepsis due to urinary tract infection ([place]) Active Problems:   AKI (acute kidney injury) ([place])   Type 2 diabetes mellitus with hyperlipidemia ([place])   Hypertension   Malignant neoplasm of upper-outer quadrant of right breast in female, estrogen receptor positive ([place])   Chronic deep vein thrombosis (DVT) of right lower extremity ([place])  Resolved Problems:   * No resolved hospital problems. [facility] Course: Yvonne [name] was admitted to the hospital with the working diagnosis of severe hypokalemia in the setting or urinary tract infection.   76 yo female with the past medical history of hypertension, breast cancer sp mastectomy, DVT, T2DM, and dyslipidemia who presented with weakness. Reported one week of feeling lethargy, lack of energy and muscle aches. No dysuria or increased urinary frequency but positive urinary tenesmus. On her initial physical examination her blood pressure was 125/55, HR 67, RR 16 and 02 saturation 97%, dry mucous membranes, heart with S1 and S2 present and rhythmic, lungs with no wheezing or rales, abdomen with no distention, no lower extremity edema.   Na 126, K 2,7, Cl 75, bicarbonate 32, glucose 137, bun 17 cr 1,68  AST 50 ALT 26  Wbc 12.3 hgb 15.5 plt 295  Sars covid 19 negative Urine analysis SG 1,009, >50 rbc, >50 wbc, 100 protein, pH 6.0  Head CT with no acute changes.  Chest radiograph with no  infiltrates.  EKG 80 bpm, left axis deviation, left anterior fascicular block, qtc 553, sinus rhythm with poor R R wave progression, no significant ST segment or T wave changes.   Patient was placed on IV fluids and IV antibiotic therapy and had her electrolytes  corrected.   12/14 transitioned to po antibiotic therapy, possible discharge home tomorrow.  12/15 renal function and electrolytes are stable, she has responded well to antibiotic therapy. Changed diuretic to  dihydropyridine calcium channel blocker to prevent further electrolyte abnormalities.    Assessment and Plan: * UTI (urinary tract infection) Sepsis ruled out.  Urine culture with multiple species.  Patient has responded well to antibiotic therapy, her wbc is 7,5 and she has remained afebrile.   She had allergic reaction to penicillin when she was pregnant many yeats ago, reaction mouth swelling.   Initially treated with IV ciprofloxacin and then successfully transitioned to oral cephalosporin with cephalexin.  Continue antibiotic therapy for 3 more days.   AKI (acute kidney injury) ([place]) CKD stage 3a with baseline cr at 1,16 to 1,20  Hypokalemia. HypoNatremia, hypophosphatemia   Patient responded well to medical therapy with IV fluids and electrolytes were corrected, with Kcl, K phos and Mag sulfate.   At the time of her discharge her K is 3,5, Mg 2,0 and serum bicarbonate at 24. Serum cr 1,3 and Na 138.  Hold diuretic therapy and continue blood pressure control with amlodipine.     Type 2 diabetes mellitus with hyperlipidemia (HCC) Her glucose remained well controlled during her hospitalization. Continue glucose control with  metformin.   Continue statin therapy.   Hypertension Blood pressure 116 to 159 mmHg Considering electrolyte abnormalities, continue blood pressure control with amlodipine (increase dose from 5 mg daily to 10 mg daily). Follow up blood pressure as outpatient.  Discontinue diuretic  therapy.    Malignant neoplasm of upper-outer quadrant of right breast in female, estrogen receptor positive ([place]) Follow up as outpatient.   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) Continue anticoagulation with rivaroxaban.          Consultants: none  Procedures performed: none  Disposition: Home Diet recommendation:  Cardiac and Carb modified diet DISCHARGE MEDICATION: Allergies as of 12/04/2022       Reactions   Penicillins Anaphylaxis        Medication List     STOP taking these medications    triamterene-hydrochlorothiazide 37.5-25 MG tablet Commonly known as: MAXZIDE-25       TAKE these medications    amLODipine 10 MG tablet Commonly known as: NORVASC Take 1 tablet (10 mg total) by mouth daily. Start taking on: December 05, 2022   cephALEXin 250 MG capsule Commonly known as: KEFLEX Take 1 capsule (250 mg total) by mouth every 8 (eight) hours for 3 days.   freestyle lancets daily.   gabapentin 300 MG capsule Commonly known as: NEURONTIN Take 900 mg by mouth at bedtime.   HYDROcodone-acetaminophen 5-325 MG tablet Commonly known as: NORCO/VICODIN Take 1 tablet by mouth 3 (three) times daily as needed for moderate pain.   metFORMIN 500 MG tablet Commonly known as: GLUCOPHAGE Take 250 mg by mouth 2 (two) times daily with a meal.   rivaroxaban 2.5 MG Tabs tablet Commonly known as: XARELTO Take 5 mg by mouth daily.   rosuvastatin 20 MG tablet Commonly known as: CRESTOR Take 20 mg by mouth daily.   Vitamin D (Ergocalciferol) 1.25 MG (50000 UNIT) Caps capsule Commonly known as: DRISDOL Take 50,000 Units by mouth every 7 (seven) days. Sunday        Discharge Exam: There were no vitals filed for this visit. BP (!) 159/75 (BP Location: Left Arm)   Pulse 72   Temp 97.9 F (36.6 C) (Oral)   Resp 18   SpO2 99%   Patient is feeling better, no chest pain or dyspnea, no nausea or vomiting, she has been out of bed  and improved energy, tolerating po well.   Neurology awake and alert ENT with no pallor Cardiovascular with S1 and S2 present and rhythmic with no gallops Respiratory with no rales or wheezing Abdomen with no distention  No lower extremity edema   Condition at discharge: stable  The results of significant diagnostics from this hospitalization (including imaging, microbiology, ancillary and laboratory) are listed below for reference.   Imaging Studies: US RENAL  Result Date: 12/01/2022 CLINICAL DATA:  Acute kidney injury EXAM: RENAL / URINARY TRACT ULTRASOUND COMPLETE COMPARISON:  None Available. FINDINGS: Right Kidney: Renal measurements: 10.4 x 4.7 x 4.5 cm = volume: 115 mL. Echogenicity within normal limits. No mass or hydronephrosis visualized. Left Kidney: Renal measurements: 10.4 x 4.3 x 3.7 cm = volume: 88 mL. Echogenicity within normal limits. No mass or hydronephrosis visualized. Bladder: Appears normal for degree of bladder distention. Other: None. IMPRESSION: No evidence of obstructive uropathy. Electronically Signed   By: Davina Poke D.O.   On: 12/01/2022 08:52   CT HEAD WO CONTRAST (5MM)  Result Date: 12/01/2022 CLINICAL DATA:  Delirium EXAM: CT HEAD WITHOUT CONTRAST TECHNIQUE: Contiguous axial  images were obtained from the base of the skull through the vertex without intravenous contrast. RADIATION DOSE REDUCTION: This exam was performed according to the departmental dose-optimization program which includes automated exposure control, adjustment of the mA and/or kV according to patient size and/or use of iterative reconstruction technique. COMPARISON:  None Available. FINDINGS: Brain: There is no mass, hemorrhage or extra-axial collection. There is generalized atrophy without lobar predilection. Hypodensity of the white matter is most commonly associated with chronic microvascular disease. Vascular: No abnormal hyperdensity of the major intracranial arteries or dural venous  sinuses. No intracranial atherosclerosis. Skull: The visualized skull base, calvarium and extracranial soft tissues are normal. Sinuses/Orbits: No fluid levels or advanced mucosal thickening of the visualized paranasal sinuses. No mastoid or middle ear effusion. The orbits are normal. IMPRESSION: 1. No acute intracranial abnormality. 2. Generalized atrophy and findings of chronic microvascular disease. Electronically Signed   By: Ulyses Jarred M.D.   On: 12/01/2022 01:47   DG Chest 2 View  Result Date: 11/30/2022 CLINICAL DATA:  Weakness EXAM: CHEST - 2 VIEW COMPARISON:  04/26/2012 FINDINGS: Previously seen left chest wall port has been removed. Cardiac shadow is within normal limits. Aortic calcifications are seen. The lungs are clear bilaterally. Degenerative change of the thoracic spine is noted. IMPRESSION: No active cardiopulmonary disease. Electronically Signed   By: Inez Catalina M.D.   On: 11/30/2022 19:56    Microbiology: Results for orders placed or performed during the hospital encounter of 11/30/22  Resp Panel by RT-PCR (Flu A&B, Covid) Anterior Nasal Swab     Status: None   Collection Time: 12/01/22  2:00 AM   Specimen: Anterior Nasal Swab  Result Value Ref Range Status   SARS Coronavirus 2 by RT PCR NEGATIVE NEGATIVE Final    Comment: (NOTE) SARS-CoV-2 target nucleic acids are NOT DETECTED.  The SARS-CoV-2 RNA is generally detectable in upper respiratory specimens during the acute phase of infection. The lowest concentration of SARS-CoV-2 viral copies this assay can detect is 138 copies/mL. A negative result does not preclude SARS-Cov-2 infection and should not be used as the sole basis for treatment or other patient management decisions. A negative result may occur with  improper specimen collection/handling, submission of specimen other than nasopharyngeal swab, presence of viral mutation(s) within the areas targeted by this assay, and inadequate number of viral copies(<138  copies/mL). A negative result must be combined with clinical observations, patient history, and epidemiological information. The expected result is Negative.  Fact Sheet for Patients:  EntrepreneurPulse.com.au  Fact Sheet for Healthcare Providers:  IncredibleEmployment.be  This test is no t yet approved or cleared by the [place] FDA and  has been authorized for detection and/or diagnosis of SARS-CoV-2 by FDA under an Emergency Use Authorization (EUA). This EUA will remain  in effect (meaning this test can be used) for the duration of the COVID-19 declaration under Section 564(b)(1) of the Act, 21 U.S.C.section 360bbb-3(b)(1), unless the authorization is terminated  or revoked sooner.       Influenza A by PCR NEGATIVE NEGATIVE Final   Influenza B by PCR NEGATIVE NEGATIVE Final    Comment: (NOTE) The Xpert Xpress SARS-CoV-2/FLU/RSV plus assay is intended as an aid in the diagnosis of influenza from Nasopharyngeal swab specimens and should not be used as a sole basis for treatment. Nasal washings and aspirates are unacceptable for Xpert Xpress SARS-CoV-2/FLU/RSV testing.  Fact Sheet for Patients: EntrepreneurPulse.com.au  Fact Sheet for Healthcare Providers: IncredibleEmployment.be  This test is not yet approved  or cleared by the [place] and has been authorized for detection and/or diagnosis of SARS-CoV-2 by FDA under an Emergency Use Authorization (EUA). This EUA will remain in effect (meaning this test can be used) for the duration of the COVID-19 declaration under Section 564(b)(1) of the Act, 21 U.S.C. section 360bbb-3(b)(1), unless the authorization is terminated or revoked.  Performed at [facility], [place] [address] [place], [place] [address]   Urine Culture     Status: Abnormal   Collection Time: 12/01/22  2:12 AM   Specimen: In/Out Cath Urine  Result Value Ref Range  Status   Specimen Description IN/OUT CATH URINE  Final   Special Requests   Final    NONE Performed at [facility], [place] [address], [place], [place] [address]    Culture MULTIPLE SPECIES PRESENT, SUGGEST RECOLLECTION (A)  Final   Report Status 12/02/2022 FINAL  Final  Culture, blood (Routine X 2) w Reflex to ID Panel     Status: None (Preliminary result)   Collection Time: 12/01/22  6:45 AM   Specimen: BLOOD  Result Value Ref Range Status   Specimen Description BLOOD LEFT ANTECUBITAL  Final   Special Requests   Final    BOTTLES DRAWN AEROBIC AND ANAEROBIC Blood Culture adequate volume   Culture   Final    NO GROWTH 3 DAYS Performed at [facility], [place] [address], [place], [place] [address]    Report Status PENDING  Incomplete    Labs: CBC: Recent Labs  Lab 11/30/22 1759 12/01/22 0645 12/02/22 0411 12/03/22 0504  WBC 12.3* 11.8* 7.1 7.5  NEUTROABS 7.9* 7.3  --   --   HGB 15.5* 14.8 12.3 11.6*  HCT 46.0 43.0 35.3* 34.9*  MCV 87.5 86.2 85.1 88.6  PLT 295 240 176 989   Basic Metabolic Panel: Recent Labs  Lab 11/30/22 1759 12/01/22 0025 12/01/22 0645 12/01/22 1411 12/02/22 0411 12/03/22 0504 12/04/22 0626  NA 126*  --  130*  --  134* 139 138  K 2.7*  --  2.7* 2.8* 3.1* 3.5 3.5  CL 75*  --  80*  --  97* 106 103  CO2 32  --  30  --  '29 23 24  '$ GLUCOSE 137*  --  151*  --  122* 109* 112*  BUN 17  --  18  --  '11 11 13  '$ CREATININE 1.68*  --  1.43*  --  1.16* 1.16* 1.33*  CALCIUM 9.8  --  9.5  --  8.6* 8.6* 9.3  MG  --  2.8*  --   --   --   --  2.0  PHOS  --   --  2.4*  --  1.9* 1.9*  --    Liver Function Tests: Recent Labs  Lab 11/30/22 1759 12/01/22 0645 12/03/22 0504  AST 50* 36  --   ALT 26 24  --   ALKPHOS 63 59  --   BILITOT 1.5* 1.2  --   PROT 8.2* 7.3  --   ALBUMIN 4.0 3.8 2.7*   CBG: Recent Labs  Lab 12/02/22 2029 12/03/22 0748 12/03/22 1136 12/03/22 1703 12/04/22 0743  GLUCAP 130* 108* 96  96 124* 121*    Discharge time  spent: greater than 30 minutes.  Signed: Tawni Millers, MD [facility] 12/04/2022

## 2022-12-04 NOTE — Progress Notes (Signed)
Discharge instructions given to the patient and her husband.  CM talked to the patient about options for PCP.  Both patient and husband verbalized understanding.  Needs addressed.  Discharged home.

## 2022-12-04 NOTE — Progress Notes (Signed)
OT Cancellation Note  Patient Details Name: Yvonne [name] MRN: [id number] DOB: 12-21-1946   Cancelled Treatment:    Reason Eval/Treat Not Completed: Patient declined, no reason specified, returning home this date.    Trek Kimball D Carleah Yablonski 12/04/2022, 11:59 AM 12/04/2022  RP, OTR/L  Acute Rehabilitation Services  Office:  [phone]

## 2022-12-06 LAB — CULTURE, BLOOD (ROUTINE X 2)
Culture: NO GROWTH
Special Requests: ADEQUATE

## 2023-01-01 ENCOUNTER — Ambulatory Visit (INDEPENDENT_AMBULATORY_CARE_PROVIDER_SITE_OTHER): Payer: Medicare Other

## 2023-01-01 VITALS — BP 128/89 | HR 79 | Temp 98.3°F | Ht 66.0 in | Wt 187.0 lb

## 2023-01-01 DIAGNOSIS — N390 Urinary tract infection, site not specified: Secondary | ICD-10-CM | POA: Diagnosis not present

## 2023-01-01 DIAGNOSIS — E1159 Type 2 diabetes mellitus with other circulatory complications: Secondary | ICD-10-CM

## 2023-01-01 DIAGNOSIS — Z7984 Long term (current) use of oral hypoglycemic drugs: Secondary | ICD-10-CM

## 2023-01-01 DIAGNOSIS — C50411 Malignant neoplasm of upper-outer quadrant of right female breast: Secondary | ICD-10-CM

## 2023-01-01 DIAGNOSIS — E785 Hyperlipidemia, unspecified: Secondary | ICD-10-CM

## 2023-01-01 DIAGNOSIS — E1169 Type 2 diabetes mellitus with other specified complication: Secondary | ICD-10-CM

## 2023-01-01 DIAGNOSIS — I159 Secondary hypertension, unspecified: Secondary | ICD-10-CM | POA: Diagnosis not present

## 2023-01-01 DIAGNOSIS — G8929 Other chronic pain: Secondary | ICD-10-CM

## 2023-01-01 DIAGNOSIS — Z170 Estrogen receptor positive status [ER+]: Secondary | ICD-10-CM

## 2023-01-01 DIAGNOSIS — I825Y1 Chronic embolism and thrombosis of unspecified deep veins of right proximal lower extremity: Secondary | ICD-10-CM

## 2023-01-01 MED ORDER — ROSUVASTATIN CALCIUM 40 MG PO TABS: 40.0000 mg | ORAL_TABLET | Freq: Every day | ORAL | Status: DC

## 2023-01-01 MED ORDER — GABAPENTIN 300 MG PO CAPS: 900.0000 mg | ORAL_CAPSULE | Freq: Every day | ORAL | 2 refills | Status: DC

## 2023-01-01 MED ORDER — AMLODIPINE BESYLATE 10 MG PO TABS: 10.0000 mg | ORAL_TABLET | Freq: Every day | ORAL | 2 refills | Status: DC

## 2023-01-01 MED ORDER — METFORMIN HCL 500 MG PO TABS: 250.0000 mg | ORAL_TABLET | Freq: Two times a day (BID) | ORAL | 3 refills | Status: DC

## 2023-01-01 NOTE — Patient Instructions (Signed)
Yvonne [name], it was a pleasure seeing you today! You endorsed feeling well today. Below are some of the things we talked about this visit. We look forward to seeing you in the follow up appointment!  Today we discussed: I will call with your lab results and if any changes are needed.  I'm going to get records from your old clinic and then refill the xarelto and norco if appropriate. I will also schedule the colonoscopy after that if your last one was more than 10 years ago.  The eye doctor and mammogram office will call you to schedule.  I have ordered the following labs today:  Lab Orders         Microalbumin / Creatinine Urine Ratio         Lipid Profile         Urinalysis, Reflex Microscopic         BMP8+Anion Gap        Referrals ordered today:   Referral Orders         Ambulatory referral to Ophthalmology       I have ordered the following medication/changed the following medications:   Stop the following medications: Medications Discontinued During This Encounter  Medication Reason   metFORMIN (GLUCOPHAGE) 500 MG tablet Reorder   rosuvastatin (CRESTOR) 20 MG tablet Reorder   gabapentin (NEURONTIN) 300 MG capsule Reorder     Start the following medications: Meds ordered this encounter  Medications   metFORMIN (GLUCOPHAGE) 500 MG tablet    Sig: Take 0.5 tablets (250 mg total) by mouth 2 (two) times daily with a meal.    Dispense:  90 tablet    Refill:  3   rosuvastatin (CRESTOR) 40 MG tablet    Sig: Take 1 tablet (40 mg total) by mouth daily.    Dispense:  30 tablet   gabapentin (NEURONTIN) 300 MG capsule    Sig: Take 3 capsules (900 mg total) by mouth at bedtime.    Dispense:  90 capsule    Refill:  2     Follow-up:  4 weeks AC, chronic pain/pain contract    Please make sure to arrive 15 minutes prior to your next appointment. If you arrive late, you may be asked to reschedule.   We look forward to seeing you next time. Please call our clinic at  [phone] if you have any questions or concerns. The best time to call is Monday-Friday from 9am-4pm, but there is someone available 24/7. If after hours or the weekend, call the main hospital number and ask for the Internal Medicine Resident On-Call. If you need medication refills, please notify your pharmacy one week in advance and they will send [place] a request.  Thank you for letting us take part in your care. Wishing you the best!  Thank you, Linus Galas MD

## 2023-01-02 LAB — MICROSCOPIC EXAMINATION: Bacteria, UA: NONE SEEN

## 2023-01-02 LAB — URINALYSIS, ROUTINE W REFLEX MICROSCOPIC
Bilirubin, UA: NEGATIVE
Glucose, UA: NEGATIVE
Ketones, UA: NEGATIVE
Leukocytes,UA: NEGATIVE
Nitrite, UA: NEGATIVE
Specific Gravity, UA: 1.017 (ref 1.005–1.030)
Urobilinogen, Ur: 1 mg/dL (ref 0.2–1.0)
pH, UA: 6.5 (ref 5.0–7.5)

## 2023-01-02 LAB — MICROALBUMIN / CREATININE URINE RATIO
Creatinine, Urine: 160.3 mg/dL
Microalb/Creat Ratio: 244 mg/g creat — ABNORMAL HIGH (ref 0–29)
Microalbumin, Urine: 391.3 ug/mL

## 2023-01-02 NOTE — Progress Notes (Addendum)
CC: Hospital follow-up, establish care  HPI:  Yvonne [name] is a 77 y.o.-year-old female with past medical history as below presenting for hospital follow-up, establish care.  Please see encounters tab for problem-based charting.  Past Medical History:  Diagnosis Date   Arthritis    Breast cancer ([employer]) 04/2012   right breast   Bruises easily    Cancer ([place]) 03/25/12   right breast   Chronic back pain    arthritis   Elevated cholesterol    takes Crestor daily   Headache(784.0)    Hemorrhoids    History of chemotherapy    x 1, pt unable to tolerate, on PO chemotherpay   Hypertension    takes Maxzide daily    Phlebitis 20yr ago   hx of-   Seasonal allergies    takes Zyrtec daily and Nasal Spray prn   Review of Systems: As in HPI.  Please see encounters tab for problem based charting.  Physical Exam:  Vitals:   01/01/23 0946 01/01/23 1134  BP: (!) 142/72 128/89  Pulse:  79  Temp: 98.3 F (36.8 C)   TempSrc: Oral   SpO2: 98%   Weight: 187 lb (84.8 kg)   Height: '5\' 6"'$  (1.676 m)    General:Well-appearing, pleasant, In NAD Cardiac: RRR, no murmurs rubs or gallops. Respiratory: Normal work of breathing on room air, CTAB Abdominal: Soft, nontender, nondistended   Assessment & Plan:   Hypertension Blood pressure 128/89 clinic today.  No chest pain, vision changes, lower extremity edema, dyspnea.  Patient mention she was not able to get her amlodipine which was started during the recent admission.  Thought she was still supposed be taking triamterene-HCTZ.  Explained again that because of her recent electrolyte abnormalities, amlodipine 10 would be a safer choice.  Represcribed and she will pick up and start taking. - Follow-up at next visit and titrate as needed  Chronic deep vein thrombosis (DVT) of right lower extremity ([place] Patient mention she has been taking low renally dosed Xarelto to prevent blood clots.  She said she had a DVT more than a year ago.   Not much record of this event and whether was provoked or not.  Of note, she has previously been treated for breast cancer though mentions she has been in remission for more than 10 years.  Also noted in her chart that she had another DVT before the recent one when she was pregnant.  With this history, safer option would be to pursue indefinite anticoagulation as she has been taking until this point.  However, need to confirm with records and discuss risks and benefits of anticoagulation with her.  She has a supply of Xarelto for now, can request refill from her previous PCP while we are transitioning care.  Type 2 diabetes mellitus with hyperlipidemia (HCC) On low-dose metformin to 50 twice daily, A1c last month was 6.7.  Will continue current regimen.  On Crestor 40 for hyperlipidemia.  Obtain lipid panel and microalbumin/creatinine ratio today.  She takes gabapentin 900 at bedtime for neuropathic pain and she says this also helps her sleep.  Warned her about being careful on this medicine as it can cause her to be drowsy and unsteady.  She understands and has been taking it for quite a while.  UTI (urinary tract infection) Was recently hospitalized for multi colony UTI without sepsis.  Completed course of antibiotics.  She mentions that she is still having some discoloration in her urine, but denies dysuria,  vaginal bleeding, fevers, nausea or vomiting, abdominal pain.  She mentions she does feel fatigue and has felt weaker after her recent hospitalization.  No abdominal pain on exam.  Some of her symptoms could be attributed to fatigue and deconditioning from hospitalization, but will reobtain urine studies and BMP today.  If she is still having hematuria, can consider further workup.  Malignant neoplasm of upper-outer quadrant of right breast in female, estrogen receptor positive ([place]) Has been in remission for about 10 years according to her.  Has mammogram annually to monitor.  Sent referral for  mammogram, which she will likely schedule in May which is when it is due.  Chronic pain Patient suffers from chronic back and knee pain for which she has previously been prescribed Norco.  She has also been pursuing conservative pain management for her knee with some relief.  She typically only takes Norco once a day or less.  Ambulates with a cane.  Can consider continuing Norco with pain contract or switching to other regimen at next visit after we obtain full records.  Addendum:  Of note, has elevated Cr, GFR c/w CKD 3a with microalbuminuria, she might be a good candidate for SGLT2 and is also not on an ACE/ARB. Still has some hematuria as well as granular casts on her UA, which are nonspecific, but no clear concern for persistent infection. Given recent admission for UTI, might just be residual, might consider repeating at [facility] with dipstick and referral to urology if hematuria persists. Also is hypercalcemic and hypokalemic, hypercalcemia is new but hypokalemia was present during and before admission as well. Of note, she was previously on triamterene-HCTZ which was dced during admission out of concern for electrolyte derangements but she had mistakenly been taking it. I asked her to stop taking it and switch to amlodipine during our visit. She was also previously on potassium supplementation. Would recheck BMP at [facility] to assess for necessity of K supplementation, if hypercalcemia persists as well, might have to consider assessing for myeloma given she has renal dysfunction and was recently anemic.   Patient discussed with Dr. Evette Doffing

## 2023-01-03 DIAGNOSIS — G8929 Other chronic pain: Secondary | ICD-10-CM | POA: Insufficient documentation

## 2023-01-03 LAB — LIPID PANEL
Chol/HDL Ratio: 2.4 ratio (ref 0.0–4.4)
Cholesterol, Total: 155 mg/dL (ref 100–199)
HDL: 64 mg/dL (ref >39)
LDL Chol Calc (NIH): 67 mg/dL (ref 0–99)
Triglycerides: 138 mg/dL (ref 0–149)
VLDL Cholesterol Cal: 24 mg/dL (ref 5–40)

## 2023-01-03 LAB — BMP8+ANION GAP
Anion Gap: 22 mmol/L — ABNORMAL HIGH (ref 10.0–18.0)
BUN/Creatinine Ratio: 12 (ref 12–28)
BUN: 15 mg/dL (ref 8–27)
CO2: 28 mmol/L (ref 20–29)
Calcium: 10.6 mg/dL — ABNORMAL HIGH (ref 8.7–10.3)
Chloride: 87 mmol/L — ABNORMAL LOW (ref 96–106)
Creatinine, Ser: 1.23 mg/dL — ABNORMAL HIGH (ref 0.57–1.00)
Glucose: 137 mg/dL — ABNORMAL HIGH (ref 70–99)
Potassium: 3.1 mmol/L — ABNORMAL LOW (ref 3.5–5.2)
Sodium: 137 mmol/L (ref 134–144)
eGFR: 46 mL/min/{1.73_m2} — ABNORMAL LOW (ref >59)

## 2023-01-03 NOTE — Assessment & Plan Note (Signed)
Patient suffers from chronic back and knee pain for which she has previously been prescribed Norco.  She has also been pursuing conservative pain management for her knee with some relief.  She typically only takes Norco once a day or less.  Ambulates with a cane.  Can consider continuing Norco with pain contract or switching to other regimen at next visit after we obtain full records.

## 2023-01-03 NOTE — Assessment & Plan Note (Signed)
On low-dose metformin to 50 twice daily, A1c last month was 6.7.  Will continue current regimen.  On Crestor 40 for hyperlipidemia.  Obtain lipid panel and microalbumin/creatinine ratio today.  She takes gabapentin 900 at bedtime for neuropathic pain and she says this also helps her sleep.  Warned her about being careful on this medicine as it can cause her to be drowsy and unsteady.  She understands and has been taking it for quite a while.

## 2023-01-03 NOTE — Assessment & Plan Note (Signed)
Has been in remission for about 10 years according to her.  Has mammogram annually to monitor.  Sent referral for mammogram, which she will likely schedule in May which is when it is due.

## 2023-01-03 NOTE — Assessment & Plan Note (Addendum)
Patient mention she has been taking low renally dosed Xarelto to prevent blood clots.  She said she had a DVT more than a year ago.  Not much record of this event and whether was provoked or not.  Of note, she has previously been treated for breast cancer though mentions she has been in remission for more than 10 years.  Also noted in her chart that she had another DVT before the recent one when she was pregnant.  With this history, safer option would be to pursue indefinite anticoagulation as she has been taking until this point.  However, need to confirm with records and discuss risks and benefits of anticoagulation with her.  She has a supply of Xarelto for now, can request refill from her previous PCP while we are transitioning care.

## 2023-01-03 NOTE — Assessment & Plan Note (Signed)
Blood pressure 128/89 clinic today.  No chest pain, vision changes, lower extremity edema, dyspnea.  Patient mention she was not able to get her amlodipine which was started during the recent admission.  Thought she was still supposed be taking triamterene-HCTZ.  Explained again that because of her recent electrolyte abnormalities, amlodipine 10 would be a safer choice.  Represcribed and she will pick up and start taking. - Follow-up at next visit and titrate as needed

## 2023-01-03 NOTE — Assessment & Plan Note (Signed)
Was recently hospitalized for multi colony UTI without sepsis.  Completed course of antibiotics.  She mentions that she is still having some discoloration in her urine, but denies dysuria, vaginal bleeding, fevers, nausea or vomiting, abdominal pain.  She mentions she does feel fatigue and has felt weaker after her recent hospitalization.  No abdominal pain on exam.  Some of her symptoms could be attributed to fatigue and deconditioning from hospitalization, but will reobtain urine studies and BMP today.  If she is still having hematuria, can consider further workup.

## 2023-01-06 NOTE — Progress Notes (Signed)
Internal Medicine Clinic Attending  Case discussed with Dr. Jodell Cipro  At the time of the visit.  We reviewed the resident's history and exam and pertinent patient test results.  I agree with the assessment, diagnosis, and plan of care documented in the resident's note.

## 2023-01-28 LAB — HM DIABETES EYE EXAM

## 2023-01-29 ENCOUNTER — Ambulatory Visit (INDEPENDENT_AMBULATORY_CARE_PROVIDER_SITE_OTHER): Payer: Medicare Other

## 2023-01-29 ENCOUNTER — Other Ambulatory Visit: Payer: Self-pay

## 2023-01-29 VITALS — BP 118/54 | HR 72 | Temp 98.6°F | Resp 32 | Ht 66.0 in | Wt 196.2 lb

## 2023-01-29 DIAGNOSIS — E876 Hypokalemia: Secondary | ICD-10-CM | POA: Insufficient documentation

## 2023-01-29 DIAGNOSIS — I159 Secondary hypertension, unspecified: Secondary | ICD-10-CM

## 2023-01-29 DIAGNOSIS — E785 Hyperlipidemia, unspecified: Secondary | ICD-10-CM | POA: Insufficient documentation

## 2023-01-29 DIAGNOSIS — G8929 Other chronic pain: Secondary | ICD-10-CM | POA: Diagnosis not present

## 2023-01-29 DIAGNOSIS — E1169 Type 2 diabetes mellitus with other specified complication: Secondary | ICD-10-CM | POA: Diagnosis not present

## 2023-01-29 MED ORDER — EMPAGLIFLOZIN 10 MG PO TABS: 10.0000 mg | ORAL_TABLET | Freq: Every day | ORAL | 2 refills | Status: DC

## 2023-01-29 MED ORDER — FREESTYLE LANCETS MISC: 1.0000 | Freq: Every day | 2 refills | Status: DC

## 2023-01-29 MED ORDER — VITAMIN D (ERGOCALCIFEROL) 1.25 MG (50000 UNIT) PO CAPS: 50000.0000 [IU] | ORAL_CAPSULE | ORAL | 0 refills | Status: DC

## 2023-01-29 MED ORDER — CVS GLUCOSE METER TEST STRIPS VI STRP: ORAL_STRIP | 12 refills | Status: DC

## 2023-01-29 MED ORDER — GABAPENTIN 300 MG PO CAPS: 900.0000 mg | ORAL_CAPSULE | Freq: Every day | ORAL | 2 refills | Status: DC

## 2023-01-29 NOTE — Assessment & Plan Note (Signed)
Patient has a history of T2DM. Current medications include metFORMIN 500 MG (0.5 tablet BID). Patient states that they are compliant with these medications. Patient does usually check their blood sugar at home regularly but is in need of a new glucometer. Patient denies polyuria, polydipsia, fatigue. Patient states that they do visit the ophthalmologist for yearly eye exams. A1c was 6.7% one month ago. Microalbuminuria present 4 weeks ago. Could start low dose ACEi vs SGLT2i for renal protection. Patient had sepsis suspected to be secondary to UTI in 11/2022. However, no history of recurrent UTI. No history of DKA. Will start on empagliflozin. Discussed risk of DKA and UTI with patient. Patient understands and agrees with the plan. Discussed that if she develops UTI from this medication we can stop this medication and switch her amlodipine to losartan for renal protection.    Plan: - Continue metFORMIN 500 MG (0.5 tablet BID) - Start empagliflozin 10 mg daily w/ close f/u  - F/u ophthalmology for yearly eye exams - Repeat A1c in 2 months

## 2023-01-29 NOTE — Progress Notes (Signed)
CC: F/u T2DM  HPI:  Yvonne [name] is a 77 y.o. female with past medical history of HTN, T2DM, DVT (2021, on Xarelto), malignant neoplasm of the right breast (in remission), OA, and chronic knee/back pain that presents for f/u of T2DM.   Patient has a history of T2DM. Current medications include metFORMIN 500 MG (0.5 tablet BID). Patient states that they are compliant with these medications. Patient does usually check their blood sugar at home regularly but is in need of a new glucometer. Patient denies polyuria, polydipsia, fatigue. Patient states that they do visit the ophthalmologist for yearly eye exams.   Patient has a history of HTN. Current medications include amLODipine 10 mg daily. Patient states that they are compliant with these medications. Patient states that they do check their BP regularly at home but is unsure of the values. Patient denies HA, lightheadedness, dizziness, CP, or SOB.  Patient has a history of chronic back and knee pain. She was last seen for this in clinic on 1/14. Patient was previouolsly prescribed Norco by her prior PCP. She takes 1X or less Norco per day. She ambulates with a cane at baseline. Patient uses heating pad at home which helps. Tylenol also helps. Patient would not like to be restarted on Norco at this time.  Patient has a history of HLD. Currently taking rosuvastatin 40 mg daily.   Patient had hypokalemia noted on BMP from 1/12. Not currently on potassium lowering medications.   Patient would not like to be screened for Hep C today. She would not like tdap, pneumonia vaccine, or influenza vaccine.   Allergies as of 01/29/2023       Reactions   Penicillins Anaphylaxis        Medication List        Accurate as of January 29, 2023 11:01 AM. If you have any questions, ask your nurse or doctor.          amLODipine 10 MG tablet Commonly known as: NORVASC Take 1 tablet (10 mg total) by mouth daily.   freestyle lancets daily.    gabapentin 300 MG capsule Commonly known as: NEURONTIN Take 3 capsules (900 mg total) by mouth at bedtime.   HYDROcodone-acetaminophen 5-325 MG tablet Commonly known as: NORCO/VICODIN Take 1 tablet by mouth 3 (three) times daily as needed for moderate pain.   metFORMIN 500 MG tablet Commonly known as: GLUCOPHAGE Take 0.5 tablets (250 mg total) by mouth 2 (two) times daily with a meal.   rivaroxaban 2.5 MG Tabs tablet Commonly known as: XARELTO Take 5 mg by mouth daily.   rosuvastatin 40 MG tablet Commonly known as: CRESTOR Take 1 tablet (40 mg total) by mouth daily.   Vitamin D (Ergocalciferol) 1.25 MG (50000 UNIT) Caps capsule Commonly known as: DRISDOL Take 50,000 Units by mouth every 7 (seven) days. Sunday         Past Medical History:  Diagnosis Date   Arthritis    Breast cancer ([place]) 04/2012   right breast   Bruises easily    Cancer ([place]) 03/25/12   right breast   Chronic back pain    arthritis   Elevated cholesterol    takes Crestor daily   Headache(784.0)    Hemorrhoids    History of chemotherapy    x 1, pt unable to tolerate, on PO chemotherpay   Hypertension    takes Maxzide daily    Phlebitis 56yr ago   hx of-   Seasonal allergies  takes Zyrtec daily and Nasal Spray prn   Review of Systems:  per HPI.   Physical Exam: Vitals:   01/29/23 1046 01/29/23 1057  BP: (!) 143/62 (!) 118/54  Pulse: 72 72  Resp: (!) 32   Temp: 98.6 F (37 C)   TempSrc: Oral   SpO2: 97%   Weight: 196 lb 3.2 oz (89 kg)   Height: 5' 6"$  (1.676 m)    Constitutional: Well-developed, well-nourished, appears comfortable  HENT: Normocephalic and atraumatic.  Eyes: EOM are normal. PERRL.  Neck: Normal range of motion.  Cardiovascular: Regular rate, regular rhythm. No murmurs, rubs, or gallops. Normal radial and PT pulses bilaterally. No LE edema.  Pulmonary: Normal respiratory effort. No wheezes, rales, or rhonchi.   Abdominal: Soft. Non-distended. No tenderness. Normal  bowel sounds.  Musculoskeletal: Normal range of motion. Ambulates slowly with a cane.    Neurological: Alert and oriented to person, place, and time. Non-focal. Skin: warm and dry.    Assessment & Plan:   See Encounters Tab for problem based charting.  Patient seen with Dr. Cain Sieve.

## 2023-01-29 NOTE — Assessment & Plan Note (Signed)
Patient has a history of HTN. Current medications include amLODipine 10 mg daily. Patient states that they are compliant with these medications. Patient states that they do check their BP regularly at home but is unsure of the values. Patient denies HA, lightheadedness, dizziness, CP, or SOB. Initial BP today is 143/62. Repeat BP is 118/54.    Plan: - Continue amLODipine 10 mg daily

## 2023-01-29 NOTE — Assessment & Plan Note (Signed)
Hypokalemia noted on BMP from 1/12. Not on potassium lowering medications. Will repeat BMP today to reassess.  Plan: - Repeat BMP today

## 2023-01-29 NOTE — Assessment & Plan Note (Signed)
Patient has a history of chronic back and knee pain. She was last seen for this in clinic on 1/14. Patient was previouolsly prescribed Norco by her prior PCP. She takes 1X or less Norco per day. She ambulates with a cane at baseline. Patient uses heating pad at home which helps. Tylenol also helps. Patient would not like to be restarted on Norco at this time.  Plan: - Continue tylenol and heating pad

## 2023-01-29 NOTE — Patient Instructions (Addendum)
Thank you for coming to see [place] in clinic Ms. Yvonne [name].   Plan: - Please continue taking metformin 500 mg (1/2 tablet with breakfast and 1/2 tablet with dinner) for your diabetes  - Please start taking empagliflozin 10 mg daily for your diabetes and to protect your kidneys  - We drew your blood today to check your electrolytes and kidney function (we will call you with these results)     It was very nice to see you, thank you for allowing [place] to be involved in your care.   Please arrive to your next appointment 5-10 minutes before your scheduled appointment time, thank you!

## 2023-01-29 NOTE — Assessment & Plan Note (Signed)
Lipid panel WNL 4 weeks ago w/ LDL 67. Currently taking rosuvastatin 40 mg daily.   Plan: - Continue rosuvastatin 40 mg daily

## 2023-01-30 ENCOUNTER — Telehealth: Payer: Self-pay | Admitting: Student

## 2023-01-30 LAB — BMP8+ANION GAP
Anion Gap: 22 mmol/L — ABNORMAL HIGH (ref 10.0–18.0)
BUN/Creatinine Ratio: 9 — ABNORMAL LOW (ref 12–28)
BUN: 10 mg/dL (ref 8–27)
CO2: 26 mmol/L (ref 20–29)
Calcium: 10.1 mg/dL (ref 8.7–10.3)
Chloride: 98 mmol/L (ref 96–106)
Creatinine, Ser: 1.1 mg/dL — ABNORMAL HIGH (ref 0.57–1.00)
Glucose: 115 mg/dL — ABNORMAL HIGH (ref 70–99)
Potassium: 2.4 mmol/L — CL (ref 3.5–5.2)
Sodium: 146 mmol/L — ABNORMAL HIGH (ref 134–144)
eGFR: 52 mL/min/{1.73_m2} — ABNORMAL LOW (ref >59)

## 2023-01-30 NOTE — Telephone Encounter (Signed)
Received page from [facility] for critical result for Yvonne [name]. K 2.4, verified. I attempted to reach patient 1:24PM today, left VM. Will try again this afternoon. Plan to give KlorCon tablets and have her return for lab-only appointment next week.  Sanjuan Dame, MD Internal Medicine PGY-3 Pager: [phone]

## 2023-02-01 ENCOUNTER — Telehealth: Payer: Self-pay | Admitting: *Deleted

## 2023-02-01 ENCOUNTER — Other Ambulatory Visit: Payer: Self-pay

## 2023-02-01 DIAGNOSIS — E876 Hypokalemia: Secondary | ICD-10-CM

## 2023-02-01 MED ORDER — POTASSIUM CHLORIDE CRYS ER 20 MEQ PO TBCR: 20.0000 meq | EXTENDED_RELEASE_TABLET | Freq: Two times a day (BID) | ORAL | 0 refills | Status: DC

## 2023-02-01 NOTE — Telephone Encounter (Signed)
RTC from patient .  Patient was informed of abnormal Potassium level and need for treatment.  Patient voiced .understanidng of the plan and stated that she was unable to pick up a previous prescription that was sen to the pharmacy until Wednesday when she gets money.  Patient was offered samples from the Clinics but refused stating that she will get the medication on Wednesday.  Patient was informed that she will need to come in for repeat labwork 1 week after she has taken the Potassium.  Patient voiced understand and will call to make the appointment when she has gotten the Potassium.  Patient was also encouraged to eat foods with high Potassium like potatoes, tomatoes and bananas.Marland Kitchen

## 2023-02-01 NOTE — Progress Notes (Signed)
Tried calling patient and husband 3X today without answer. Left voicemail to call back clinic on patients voicemail. Per Regino Schultze note from today, patient called back to the clinic and was informed of abnormal potassium. Plan for potassium supplementation and lab visit for repeat BMP was discussed. Patient reported that she will pick up the potassium on 2/14 after she gets paid. She refused clinic samples of potassium. She agreed to call and schedule a lab only visit after she has taken the potassium. Patient also encouraged to eat foods with good potassium content (potatoes, tomatoes, bananas).

## 2023-02-01 NOTE — Telephone Encounter (Signed)
Call to patient and husband this am.  Message left on patient's VM to call the Clinics as soon as possible.  Unable to leave message on husband's phone.

## 2023-02-08 NOTE — Progress Notes (Signed)
Internal Medicine Clinic Attending  Case discussed with Dr. Alton Revere  At the time of the visit.  We reviewed the resident's history and exam and pertinent patient test results.  I agree with the assessment, diagnosis, and plan of care documented in the resident's note.    I am very worried about patient's hypokalemia - I appreciate's Dr. Gilmer Mor efforts to contact the patient. Agree with plan for urgent potassium supplementation with clinic samples, and repeat labs as soon as possible.

## 2023-02-09 ENCOUNTER — Encounter: Payer: Self-pay | Admitting: Dietician

## 2023-02-12 LAB — HM DIABETES EYE EXAM

## 2023-02-18 ENCOUNTER — Other Ambulatory Visit (INDEPENDENT_AMBULATORY_CARE_PROVIDER_SITE_OTHER): Payer: Medicare Other

## 2023-02-18 DIAGNOSIS — E876 Hypokalemia: Secondary | ICD-10-CM | POA: Diagnosis not present

## 2023-02-20 ENCOUNTER — Other Ambulatory Visit: Payer: Self-pay

## 2023-02-20 DIAGNOSIS — E876 Hypokalemia: Secondary | ICD-10-CM

## 2023-02-20 LAB — BMP8+ANION GAP
Anion Gap: 22 mmol/L — ABNORMAL HIGH (ref 10.0–18.0)
BUN/Creatinine Ratio: 11 — ABNORMAL LOW (ref 12–28)
BUN: 12 mg/dL (ref 8–27)
CO2: 26 mmol/L (ref 20–29)
Calcium: 9.8 mg/dL (ref 8.7–10.3)
Chloride: 94 mmol/L — ABNORMAL LOW (ref 96–106)
Creatinine, Ser: 1.08 mg/dL — ABNORMAL HIGH (ref 0.57–1.00)
Glucose: 129 mg/dL — ABNORMAL HIGH (ref 70–99)
Potassium: 2.5 mmol/L — ABNORMAL LOW (ref 3.5–5.2)
Sodium: 142 mmol/L (ref 134–144)
eGFR: 53 mL/min/{1.73_m2} — ABNORMAL LOW (ref >59)

## 2023-02-20 MED ORDER — POTASSIUM CHLORIDE CRYS ER 20 MEQ PO TBCR: 20.0000 meq | EXTENDED_RELEASE_TABLET | Freq: Two times a day (BID) | ORAL | 0 refills | Status: DC

## 2023-02-20 NOTE — Progress Notes (Signed)
Called patient and updated on results. Discussed need for potassium supplementation at home. Sent in potassium to her pharmacy. Messaged front desk to schedule lab only visit later this week to recheck potassium after supplementation. Patient agrees with the plan.

## 2023-03-10 ENCOUNTER — Other Ambulatory Visit: Payer: Medicare Other

## 2023-03-16 ENCOUNTER — Other Ambulatory Visit: Payer: Self-pay

## 2023-03-16 NOTE — Telephone Encounter (Signed)
Needs to have vit D level checked (has appt on 4/9)

## 2023-03-17 ENCOUNTER — Other Ambulatory Visit (INDEPENDENT_AMBULATORY_CARE_PROVIDER_SITE_OTHER): Payer: Medicare Other

## 2023-03-17 DIAGNOSIS — E876 Hypokalemia: Secondary | ICD-10-CM

## 2023-03-18 ENCOUNTER — Other Ambulatory Visit: Payer: Self-pay | Admitting: Internal Medicine

## 2023-03-18 DIAGNOSIS — E876 Hypokalemia: Secondary | ICD-10-CM

## 2023-03-18 LAB — BMP8+ANION GAP
Anion Gap: 20 mmol/L — ABNORMAL HIGH (ref 10.0–18.0)
BUN/Creatinine Ratio: 11 — ABNORMAL LOW (ref 12–28)
BUN: 11 mg/dL (ref 8–27)
CO2: 26 mmol/L (ref 20–29)
Calcium: 10.1 mg/dL (ref 8.7–10.3)
Chloride: 94 mmol/L — ABNORMAL LOW (ref 96–106)
Creatinine, Ser: 1.03 mg/dL — ABNORMAL HIGH (ref 0.57–1.00)
Glucose: 115 mg/dL — ABNORMAL HIGH (ref 70–99)
Potassium: 2.8 mmol/L — ABNORMAL LOW (ref 3.5–5.2)
Sodium: 140 mmol/L (ref 134–144)
eGFR: 56 mL/min/{1.73_m2} — ABNORMAL LOW (ref >59)

## 2023-03-18 MED ORDER — POTASSIUM CHLORIDE CRYS ER 20 MEQ PO TBCR: 20.0000 meq | EXTENDED_RELEASE_TABLET | Freq: Every day | ORAL | 0 refills | Status: DC

## 2023-03-18 NOTE — Progress Notes (Signed)
Remains hypokalemic despite potassium supplementation as outpatient. I spoke with the patient who reports that she is taking her potassium supplements as directed and eating bananas daily. She denies chest pain, SOB, palpitations. Prescription for potassium supplementation was ordered for her today by Dr. Raymondo Band. Patient reports that she picked up her prescription and will take them as instructed. Her next appointment in clinic is on 03/30/2023 at 9:45 AM. I tried to reach out the front desk regarding scheduling her for an earlier appointment, however, none are available at this time.

## 2023-03-18 NOTE — Progress Notes (Signed)
Received critical alert for K of 2.8 - the patient has run out of her potassium supplementation that she was given in February and is still hypokalemic.  Sent in [place] 20 mEq x 10 days. I have called the patient to make her aware of this and have also sent a message to the front desk to schedule her for a lab-only appt in 2 weeks to recheck her K.

## 2023-03-24 ENCOUNTER — Other Ambulatory Visit: Payer: Self-pay

## 2023-03-24 DIAGNOSIS — I159 Secondary hypertension, unspecified: Secondary | ICD-10-CM

## 2023-03-24 MED ORDER — AMLODIPINE BESYLATE 10 MG PO TABS: 10.0000 mg | ORAL_TABLET | Freq: Every day | ORAL | 2 refills | Status: DC

## 2023-03-30 ENCOUNTER — Ambulatory Visit (INDEPENDENT_AMBULATORY_CARE_PROVIDER_SITE_OTHER): Payer: Medicare Other | Admitting: Student

## 2023-03-30 ENCOUNTER — Encounter: Payer: Self-pay | Admitting: Student

## 2023-03-30 VITALS — BP 136/73 | HR 86 | Wt 192.4 lb

## 2023-03-30 DIAGNOSIS — E785 Hyperlipidemia, unspecified: Secondary | ICD-10-CM | POA: Diagnosis not present

## 2023-03-30 DIAGNOSIS — E1169 Type 2 diabetes mellitus with other specified complication: Secondary | ICD-10-CM | POA: Diagnosis not present

## 2023-03-30 DIAGNOSIS — I159 Secondary hypertension, unspecified: Secondary | ICD-10-CM

## 2023-03-30 DIAGNOSIS — E876 Hypokalemia: Secondary | ICD-10-CM

## 2023-03-30 DIAGNOSIS — G8929 Other chronic pain: Secondary | ICD-10-CM

## 2023-03-30 DIAGNOSIS — Z7984 Long term (current) use of oral hypoglycemic drugs: Secondary | ICD-10-CM

## 2023-03-30 LAB — GLUCOSE, CAPILLARY: Glucose-Capillary: 124 mg/dL — ABNORMAL HIGH (ref 70–99)

## 2023-03-30 LAB — POCT GLYCOSYLATED HEMOGLOBIN (HGB A1C): Hemoglobin A1C: 5.9 % — AB (ref 4.0–5.6)

## 2023-03-30 MED ORDER — GABAPENTIN 300 MG PO CAPS: 900.0000 mg | ORAL_CAPSULE | Freq: Every day | ORAL | 2 refills | Status: DC

## 2023-03-30 NOTE — Assessment & Plan Note (Addendum)
A1c 5.9 today. Well controlled. She is currently on metformin 250 mg twice daily, tolerating medication without any side effects reported. She has not picked up Jardiance from the pharmacy which was prescribed to her at [facility] for renal protective. Requested refill for gabapentin for her peripheral neuropathy.    Plan -Continue metformin 250 mg BID -Encouraged patient to pick up Jardiance -Refilled gabapentin for neuropathic pain -Foot exam done today -Recheck A1c in 3-6 months

## 2023-03-30 NOTE — Patient Instructions (Addendum)
Thank you, Ms.Yanett D Treu for allowing [place] to provide your care today. Today we discussed your low potassium and diabetes.   Low Potassium -We will recheck your potassium level today -I will call with results and if you need potassium supplements   Diabetes -Your A1c is 5.9% which is very good.  -Continue your metformin  -Please pick up Jardiance from your pharmacy -Completed foot exam today  -I have sent refill for your gabapentin to your pharmacy.   I have ordered the following labs for you:  Lab Orders         Glucose, capillary         Magnesium         TSH         POC Hbg A1C      I have ordered the following medication/changed the following medications:   Stop the following medications: Medications Discontinued During This Encounter  Medication Reason   gabapentin (NEURONTIN) 300 MG capsule Reorder     Start the following medications: Meds ordered this encounter  Medications   gabapentin (NEURONTIN) 300 MG capsule    Sig: Take 3 capsules (900 mg total) by mouth at bedtime.    Dispense:  90 capsule    Refill:  2     Follow up:  2-3 months, if potassium is still low we will need you to come into the office sooner      Should you have any questions or concerns please call the internal medicine clinic at [phone].    Rana Snare, D.O. [facility]

## 2023-03-30 NOTE — Assessment & Plan Note (Addendum)
Patient here for follow-up regarding hypokalemia.  Last potassium was 2.8.  She was on potassium supplements which she completed on Sunday.  Asymptomatic.  Denies any abdominal pain, n/v/d.  She is no longer on HCTZ.  Suspected hypokalemia in setting of HCTZ previously. We will check magnesium and TSH along with repeating BMP today. If low, will restart supplementation and will need further evaluation.   Plan -Repeat BMP today -Check magnesium -Check TSH -If potassium low, restart potassium supplements

## 2023-03-30 NOTE — Progress Notes (Signed)
CC: diabetes, hypokalemia and medication refill  HPI:  [name] is a 77 y.o. female living with a history stated below and presents today for diabetes, hypokalemia and medication. Please see problem based assessment and plan for additional details.  Past Medical History:  Diagnosis Date   Arthritis    Breast cancer 04/2012   right breast   Bruises easily    Cancer 03/25/12   right breast   Chronic back pain    arthritis   Elevated cholesterol    takes Crestor daily   Headache(784.0)    Hemorrhoids    History of chemotherapy    x 1, pt unable to tolerate, on PO chemotherpay   Hypertension    takes Maxzide daily    Phlebitis 59yrs ago   hx of-   Seasonal allergies    takes Zyrtec daily and Nasal Spray prn    Current Outpatient Medications on File Prior to Visit  Medication Sig Dispense Refill   amLODipine (NORVASC) 10 MG tablet Take 1 tablet (10 mg total) by mouth daily. 30 tablet 2   empagliflozin (JARDIANCE) 10 MG TABS tablet Take 1 tablet (10 mg total) by mouth daily before breakfast. 60 tablet 2   glucose blood ([employer] GLUCOSE METER TEST STRIPS) test strip Use as instructed 100 each 12   Lancets (FREESTYLE) lancets 1 each by Other route daily. 100 each 2   metFORMIN (GLUCOPHAGE) 500 MG tablet Take 0.5 tablets (250 mg total) by mouth 2 (two) times daily with a meal. 90 tablet 3   potassium chloride SA (KLOR-CON M) 20 MEQ tablet Take 1 tablet (20 mEq total) by mouth daily for 10 days. 10 tablet 0   rivaroxaban (XARELTO) 2.5 MG TABS tablet Take 5 mg by mouth daily.     rosuvastatin (CRESTOR) 40 MG tablet Take 1 tablet (40 mg total) by mouth daily. 30 tablet    Vitamin D, Ergocalciferol, (DRISDOL) 1.25 MG (50000 UNIT) CAPS capsule Take 1 capsule (50,000 Units total) by mouth every 7 (seven) days. Sunday 5 capsule 0   No current facility-administered medications on file prior to visit.   Review of Systems: ROS negative except for what is noted on the assessment  and plan.  Vitals:   03/30/23 0937  BP: 136/73  Pulse: 86  SpO2: 97%  Weight: 192 lb 6.4 oz (87.3 kg)   Physical Exam: Constitutional: well-appearing female sitting in chair, in no acute distress HENT: normocephalic atraumatic Neck: supple Cardiovascular: regular rate and rhythm Pulmonary/Chest: normal work of breathing on room air, lungs clear to auscultation bilaterally MSK: normal bulk and tone Extremities: 2+ dorsalis pedis pulse bilaterally  Neurological: alert & oriented x 3 Skin: warm and dry Psych: pleasant mood  Assessment & Plan:   Hypokalemia Patient here for follow-up regarding hypokalemia.  Last potassium was 2.8.  She was on potassium supplements which she completed on Sunday.  Asymptomatic.  Denies any abdominal pain, n/v/d.  She is no longer on HCTZ.  Suspected hypokalemia in setting of HCTZ previously. We will check magnesium and TSH along with repeating BMP today. If low, will restart supplementation and will need further evaluation.   Plan -Repeat BMP today -Check magnesium -Check TSH -If potassium low, restart potassium supplements  Type 2 diabetes mellitus with hyperlipidemia (HCC) A1c 5.9 today. Well controlled. She is currently on metformin 250 mg twice daily, tolerating medication without any side effects reported. She has not picked up Jardiance from the pharmacy which was prescribed to her at  [facility] for renal protective. Requested refill for gabapentin for her peripheral neuropathy.    Plan -Continue metformin 250 mg BID -Encouraged patient to pick up Jardiance -Refilled gabapentin for neuropathic pain -Foot exam done today -Recheck A1c in 3-6 months  Hypertension BP Readings from Last 3 Encounters:  03/30/23 136/73  01/29/23 (!) 118/54  01/01/23 128/89   BP well controlled today on amlodipine 10 mg daily. Given concomitant hypokalemia and renal disease, patient could benefit with ACEI/ARB switch.   Plan -continue amlodipine 10 mg  daily -consider possible switch to ACEI/ARB if ongoing hypokalemia and renal protection   Patient discussed with Dr. Halina Andreas, D.O. [facility] Internal [facility], PGY-1 Phone: [phone] Date 03/30/2023 Time 1:20 PM

## 2023-03-30 NOTE — Assessment & Plan Note (Signed)
BP Readings from Last 3 Encounters:  03/30/23 136/73  01/29/23 (!) 118/54  01/01/23 128/89   BP well controlled today on amlodipine 10 mg daily. Given concomitant hypokalemia and renal disease, patient could benefit with ACEI/ARB switch.   Plan -continue amlodipine 10 mg daily -consider possible switch to ACEI/ARB if ongoing hypokalemia and renal protection

## 2023-03-31 LAB — BMP8+ANION GAP
Anion Gap: 18 mmol/L (ref 10.0–18.0)
BUN/Creatinine Ratio: 13 (ref 12–28)
BUN: 12 mg/dL (ref 8–27)
CO2: 23 mmol/L (ref 20–29)
Calcium: 10.1 mg/dL (ref 8.7–10.3)
Chloride: 98 mmol/L (ref 96–106)
Creatinine, Ser: 0.91 mg/dL (ref 0.57–1.00)
Glucose: 110 mg/dL — ABNORMAL HIGH (ref 70–99)
Potassium: 3.4 mmol/L — ABNORMAL LOW (ref 3.5–5.2)
Sodium: 139 mmol/L (ref 134–144)
eGFR: 65 mL/min/{1.73_m2} (ref >59)

## 2023-03-31 LAB — MAGNESIUM: Magnesium: 2.1 mg/dL (ref 1.6–2.3)

## 2023-03-31 LAB — TSH: TSH: 2.8 u[IU]/mL (ref 0.450–4.500)

## 2023-04-01 NOTE — Progress Notes (Signed)
Internal Medicine Clinic Attending  Case discussed with Dr. Zheng  At the time of the visit.  We reviewed the resident's history and exam and pertinent patient test results.  I agree with the assessment, diagnosis, and plan of care documented in the resident's note.  

## 2023-04-01 NOTE — Addendum Note (Signed)
Addended by: Derrek Monaco on: 04/01/2023 08:16 PM   Modules accepted: Level of Service

## 2023-04-05 ENCOUNTER — Encounter: Payer: Self-pay | Admitting: Dietician

## 2023-04-14 ENCOUNTER — Other Ambulatory Visit: Payer: Self-pay

## 2023-04-14 ENCOUNTER — Encounter: Payer: Self-pay | Admitting: Student

## 2023-04-14 ENCOUNTER — Ambulatory Visit (INDEPENDENT_AMBULATORY_CARE_PROVIDER_SITE_OTHER): Payer: Medicare Other

## 2023-04-14 ENCOUNTER — Ambulatory Visit (INDEPENDENT_AMBULATORY_CARE_PROVIDER_SITE_OTHER): Payer: Medicare Other | Admitting: Student

## 2023-04-14 VITALS — BP 129/70 | HR 84 | Temp 98.2°F | Ht 65.0 in | Wt 188.0 lb

## 2023-04-14 DIAGNOSIS — Z7984 Long term (current) use of oral hypoglycemic drugs: Secondary | ICD-10-CM

## 2023-04-14 DIAGNOSIS — Z0000 Encounter for general adult medical examination without abnormal findings: Secondary | ICD-10-CM | POA: Diagnosis not present

## 2023-04-14 DIAGNOSIS — I159 Secondary hypertension, unspecified: Secondary | ICD-10-CM | POA: Diagnosis not present

## 2023-04-14 DIAGNOSIS — I825Y1 Chronic embolism and thrombosis of unspecified deep veins of right proximal lower extremity: Secondary | ICD-10-CM

## 2023-04-14 DIAGNOSIS — M1990 Unspecified osteoarthritis, unspecified site: Secondary | ICD-10-CM

## 2023-04-14 DIAGNOSIS — M79604 Pain in right leg: Secondary | ICD-10-CM

## 2023-04-14 DIAGNOSIS — L03115 Cellulitis of right lower limb: Secondary | ICD-10-CM

## 2023-04-14 DIAGNOSIS — E876 Hypokalemia: Secondary | ICD-10-CM

## 2023-04-14 DIAGNOSIS — E1129 Type 2 diabetes mellitus with other diabetic kidney complication: Secondary | ICD-10-CM

## 2023-04-14 DIAGNOSIS — R809 Proteinuria, unspecified: Secondary | ICD-10-CM

## 2023-04-14 MED ORDER — RIVAROXABAN 2.5 MG PO TABS
2.5000 mg | ORAL_TABLET | Freq: Two times a day (BID) | ORAL | 1 refills | Status: DC
Start: 2023-04-14 — End: 2023-04-14

## 2023-04-14 MED ORDER — EMPAGLIFLOZIN 10 MG PO TABS: 10.0000 mg | ORAL_TABLET | Freq: Every day | ORAL | 2 refills | Status: DC

## 2023-04-14 MED ORDER — SULFAMETHOXAZOLE-TRIMETHOPRIM 800-160 MG PO TABS
1.0000 | ORAL_TABLET | Freq: Two times a day (BID) | ORAL | 0 refills | Status: DC
Start: 2023-04-14 — End: 2023-04-17

## 2023-04-14 MED ORDER — RIVAROXABAN 20 MG PO TABS
20.0000 mg | ORAL_TABLET | Freq: Every day | ORAL | 2 refills | Status: DC
Start: 2023-04-14 — End: 2023-04-15

## 2023-04-14 MED ORDER — AMLODIPINE BESYLATE 5 MG PO TABS
5.0000 mg | ORAL_TABLET | Freq: Every day | ORAL | 2 refills | Status: DC
Start: 2023-04-14 — End: 2023-10-15

## 2023-04-14 MED ORDER — LOSARTAN POTASSIUM 25 MG PO TABS
25.0000 mg | ORAL_TABLET | Freq: Every day | ORAL | 2 refills | Status: DC
Start: 2023-04-14 — End: 2023-04-19

## 2023-04-14 MED ORDER — DICLOFENAC SODIUM 1 % EX GEL
2.0000 g | Freq: Three times a day (TID) | CUTANEOUS | 0 refills | Status: DC | PRN
Start: 2023-04-14 — End: 2023-04-21

## 2023-04-14 NOTE — Assessment & Plan Note (Signed)
Patient living with HTN, currently on norvasc  daily and jardiance  daily. BP 129/70 today. She did have moderate proteinuria on recent microalbumin to creatinine ratio. Therefore, will decrease norvasc to  daily and add losartan  daily.  Plan: -continue jardiance -decrease norvasc to  daily -start losartan  daily

## 2023-04-14 NOTE — Assessment & Plan Note (Addendum)
Patient presenting to [facility] due to 1 week history of RLE pain. States that pain is present from lower thigh to feet, but most prominently in medial aspect of R knee. Pain does not radiate and does not feel like a burning sensation. Pain has gotten progressively worse over this past week. She denies any fevers, chills, or other systemic symptoms. States that she is not very mobile at home, typically walks from living room to bedroom or bathroom. Otherwise, she stays in a recliner and watches television. Denies any recent trauma to RLE.  She is afebrile in clinic today. On exam, she does have TTP of medial aspect of knee. R knee ROM is limited in extension, inversion, and eversion. No notable swelling noted. She does have overlying redness extending from mid-to-lower thigh down to R toes along with increased warmth in this area. No calf tenderness and both calves are similar in size.   She has a history of OA. She also has a history of RLE DVT (x2, once during pregnancy and other in 2021 -- unclear if provoked or unprovoked). Per last note, patient was supposed to continue xarelto therapy indefinitely given 2 prior DVTs. However, she states that she was told to stop taking xarelto and thus has not taken it for the last 2 months.   Differentials for her RLE pain include acute RLE DVT, post-thrombotic syndrome, cellulitis, and pain from OA. I do think she has a cellulitis and thus will treat with bactrim DS tabs BID for 1 week (allergic to penicillins - anaphylaxis). I am concerned for acute DVT given she is not currently on any anticoagulation and lives a fairly sedentary lifestyle. Will obtain venous dopplers to rule out DVT. In the meantime, will have her take xarelto  daily with supper (CrCl is >50 so no dose reduction required). Her knee pain does appear to likely be from OA so will provide voltaren gel to help manage this. She would not be a candidate for steroid injection given overlying cellulitis. If no  acute DVT on exam and if continues to have RLE pain after resolution of cellulitis, would consider compression stockings for possible post-thrombotic syndrome given chronic RLE DVT (since 2021).   Plan: -treat cellulitis with 7 day course of bactrim DS BID -f/u CBC -venous dopplers to r/o DVT in RLE -voltaren gel for R knee pain -consider compression stockings if persistent RLE pain

## 2023-04-14 NOTE — Progress Notes (Signed)
   CC: RLE pain, HTN  HPI:  Ms.Elloise D Bassett is a 77 y.o. female with history listed below presenting to the [facility] for RLE pain, HTN. Please see individualized problem based charting for full HPI.  Past Medical History:  Diagnosis Date   Acute deep vein thrombosis (DVT) of popliteal vein of right lower extremity 09/12/2020   AKI (acute kidney injury) 02/09/2013   Arthritis    Breast cancer 04/2012   right breast   Bruises easily    Cancer 03/25/2012   right breast   Chronic back pain    arthritis   Elevated cholesterol    takes Crestor daily   Headache(784.0)    Hemorrhoids    History of chemotherapy    x 1, pt unable to tolerate, on PO chemotherpay   Hypertension    takes Maxzide daily    Phlebitis 72yrs ago   hx of-   Seasonal allergies    takes Zyrtec daily and Nasal Spray prn    Review of Systems:  Negative aside from that listed in individualized problem based charting.  Physical Exam:  Vitals:   04/14/23 1327  BP: 129/70  Pulse: 84  Temp: 98.2 F (36.8 C)  TempSrc: Oral  SpO2: 96%  Weight: 188 lb (85.3 kg)  Height:  (1.651 m)   Physical Exam Constitutional:      Appearance: Normal appearance. She is obese. She is not ill-appearing.  HENT:     Mouth/Throat:     Mouth: Mucous membranes are moist.     Pharynx: Oropharynx is clear. No oropharyngeal exudate.  Cardiovascular:     Rate and Rhythm: Normal rate and regular rhythm.     Heart sounds: Normal heart sounds. No murmur heard.    No friction rub. No gallop.  Pulmonary:     Effort: Pulmonary effort is normal.     Breath sounds: Normal breath sounds. No wheezing, rhonchi or rales.  Abdominal:     General: Bowel sounds are normal. There is no distension.     Palpations: Abdomen is soft.     Tenderness: There is no abdominal tenderness.  Musculoskeletal:     Comments: R knee with increased warmth. TTP at medial aspect of R knee. No notable swelling noted. R knee ROM limited in extension,  inversion, and eversion. No calf tenderness or asymmetry noted.  Skin:    Comments: Redness on RLE from mid-to-lower thigh down to toes along with increased warmth. No notable skin lesions/wounds.   Neurological:     General: No focal deficit present.     Mental Status: She is alert and oriented to person, place, and time.  Psychiatric:        Mood and Affect: Mood normal.        Behavior: Behavior normal.        Assessment & Plan:   See Encounters Tab for problem based charting.  Patient discussed with Dr.  Lafonda Mosses

## 2023-04-14 NOTE — Patient Instructions (Addendum)
Ms. Monte,  It was a pleasure seeing you in the clinic today.   Your right leg redness is likely from a skin infection. I have prescribed an antibiotic for you (sulfamethoxazole-trimethoprim, also called bactrim). Please take 1 tablet twice a day for 7 days. I have ordered an ultrasound of your veins in your right leg to check for a blood clot there. They will call you to schedule this. Please restart your xarelto at the same dose you were taking in the past. I have refilled this for you. I have changed a couple of your blood pressure medicines to better treat it.  I have decreased your amlodipine to  daily.  I have added a new medication called losartan to help with your blood pressure. Please take 1 tablet every day. We are checking some labs today. I will call you when I have the results to go over them. Please come back in 2 weeks for your next visit.  Please call our clinic at [phone] if you have any questions or concerns. The best time to call is Monday-Friday from 9am-4pm, but there is someone available 24/7 at the same number. If you need medication refills, please notify your pharmacy one week in advance and they will send [place] a request.   Thank you for letting us take part in your care. We look forward to seeing you next time!

## 2023-04-14 NOTE — Assessment & Plan Note (Signed)
Well controlled diabetes, last A1c 5.9% earlier this month. On metformin  BID and jardiance  daily.   -continue current medications -repeat A1c in 3-6 months

## 2023-04-14 NOTE — Assessment & Plan Note (Signed)
K 3.4 on recent BMP, noted to have hypokalemia in the past as well. Will repeat BMP today to reassess. Also added losartan therapy which should help moving forward.

## 2023-04-15 ENCOUNTER — Ambulatory Visit (HOSPITAL_BASED_OUTPATIENT_CLINIC_OR_DEPARTMENT_OTHER)
Admission: RE | Admit: 2023-04-15 | Discharge: 2023-04-15 | Disposition: A | Discharge status: Home / Self Care | Payer: Medicare Other | Source: Ambulatory Visit | Attending: Internal Medicine | Admitting: Internal Medicine

## 2023-04-15 DIAGNOSIS — L03115 Cellulitis of right lower limb: Secondary | ICD-10-CM | POA: Insufficient documentation

## 2023-04-15 DIAGNOSIS — M79604 Pain in right leg: Secondary | ICD-10-CM | POA: Insufficient documentation

## 2023-04-15 DIAGNOSIS — K922 Gastrointestinal hemorrhage, unspecified: Secondary | ICD-10-CM | POA: Diagnosis not present

## 2023-04-15 DIAGNOSIS — N950 Postmenopausal bleeding: Secondary | ICD-10-CM | POA: Diagnosis not present

## 2023-04-15 LAB — BMP8+ANION GAP
Anion Gap: 23 mmol/L — ABNORMAL HIGH (ref 10.0–18.0)
BUN/Creatinine Ratio: 20 (ref 12–28)
BUN: 19 mg/dL (ref 8–27)
CO2: 19 mmol/L — ABNORMAL LOW (ref 20–29)
Calcium: 10.5 mg/dL — ABNORMAL HIGH (ref 8.7–10.3)
Chloride: 99 mmol/L (ref 96–106)
Creatinine, Ser: 0.93 mg/dL (ref 0.57–1.00)
Glucose: 97 mg/dL (ref 70–99)
Potassium: 4.1 mmol/L (ref 3.5–5.2)
Sodium: 141 mmol/L (ref 134–144)
eGFR: 64 mL/min/{1.73_m2} (ref >59)

## 2023-04-15 LAB — CBC WITH DIFFERENTIAL/PLATELET
Basophils Absolute: 0.1 10*3/uL (ref 0.0–0.2)
Basos: 1 %
EOS (ABSOLUTE): 0.4 10*3/uL (ref 0.0–0.4)
Eos: 3 %
Hematocrit: 43.9 % (ref 34.0–46.6)
Hemoglobin: 14.3 g/dL (ref 11.1–15.9)
Immature Grans (Abs): 0 10*3/uL (ref 0.0–0.1)
Immature Granulocytes: 0 %
Lymphocytes Absolute: 3.4 10*3/uL — ABNORMAL HIGH (ref 0.7–3.1)
Lymphs: 26 %
MCH: 28.8 pg (ref 26.6–33.0)
MCHC: 32.6 g/dL (ref 31.5–35.7)
MCV: 88 fL (ref 79–97)
Monocytes Absolute: 0.8 10*3/uL (ref 0.1–0.9)
Monocytes: 6 %
Neutrophils Absolute: 8.5 10*3/uL — ABNORMAL HIGH (ref 1.4–7.0)
Neutrophils: 64 %
Platelets: 282 10*3/uL (ref 150–450)
RBC: 4.97 x10E6/uL (ref 3.77–5.28)
RDW: 13.8 % (ref 11.7–15.4)
WBC: 13.2 10*3/uL — ABNORMAL HIGH (ref 3.4–10.8)

## 2023-04-15 MED ORDER — RIVAROXABAN 20 MG PO TABS: 20.0000 mg | ORAL_TABLET | Freq: Every day | ORAL | 2 refills | Status: DC

## 2023-04-15 NOTE — Progress Notes (Signed)
I reviewed the AWV findings with the provider who conducted the visit. I was present in the office suite and immediately available to provide assistance and direction throughout the time the service was provided.  Merrilyn Puma, MD [facility], PGY-3 04/15/2023, 2:03 PM

## 2023-04-15 NOTE — Progress Notes (Signed)
Subjective:   Yvonne [name] is a 77 y.o. female who presents for an Initial Medicare Annual Wellness Visit. I connected with  [name] on 04/15/23 by a  Face-To-Face encounter  and verified that I am speaking with the correct person using two identifiers.  Patient Location: Other:  Office/Clinic  Provider Location: Office/Clinic  I discussed the limitations of evaluation and management by telemedicine. The patient expressed understanding and agreed to proceed.  Review of Systems    Defer to PCP       Objective:    Today's Vitals   04/15/23 0956 04/15/23 1002  BP: 129/70   Pulse: 84   Temp: 98.2 F (36.8 C)   TempSrc: Oral   SpO2: 96%   Weight: 188 lb (85.3 kg)   Height: 5\' 5"  (1.651 m)   PainSc:  8    Body mass index is 31.28 kg/m.     04/15/2023   10:03 AM 04/15/2023    9:57 AM 01/01/2023    9:49 AM 11/30/2022    6:05 PM 11/16/2016    9:28 AM 11/07/2015   10:08 AM 02/09/2013    3:43 PM  Advanced Directives  Does Patient Have a Medical Advance Directive? No No No No No No Patient does not have advance directive  Would patient like information on creating a medical advance directive? No - Patient declined No - Patient declined No - Patient declined No - Patient declined     Pre-existing out of facility DNR order (yellow form or pink MOST form)       No    Current Medications (verified) Outpatient Encounter Medications as of 04/14/2023  Medication Sig   amLODipine (NORVASC) 5 MG tablet Take 1 tablet (5 mg total) by mouth daily.   gabapentin (NEURONTIN) 300 MG capsule Take 3 capsules (900 mg total) by mouth at bedtime.   glucose blood ([employer] GLUCOSE METER TEST STRIPS) test strip Use as instructed   Lancets (FREESTYLE) lancets 1 each by Other route daily.   losartan (COZAAR) 25 MG tablet Take 1 tablet (25 mg total) by mouth daily.   metFORMIN (GLUCOPHAGE) 500 MG tablet Take 0.5 tablets (250 mg total) by mouth 2 (two) times daily with a meal.   potassium  chloride SA (KLOR-CON M) 20 MEQ tablet Take 1 tablet (20 mEq total) by mouth daily for 10 days.   rosuvastatin (CRESTOR) 40 MG tablet Take 1 tablet (40 mg total) by mouth daily.   Vitamin D, Ergocalciferol, (DRISDOL) 1.25 MG (50000 UNIT) CAPS capsule Take 1 capsule (50,000 Units total) by mouth every 7 (seven) days. Sunday   [DISCONTINUED] empagliflozin (JARDIANCE) 10 MG TABS tablet Take 1 tablet (10 mg total) by mouth daily before breakfast.   [DISCONTINUED] rivaroxaban (XARELTO) 2.5 MG TABS tablet Take 1 tablet (2.5 mg total) by mouth 2 (two) times daily.   No facility-administered encounter medications on file as of 04/14/2023.    Allergies (verified) Penicillins   History: Past Medical History:  Diagnosis Date   Acute deep vein thrombosis (DVT) of popliteal vein of right lower extremity 09/12/2020   AKI (acute kidney injury) 02/09/2013   Arthritis    Breast cancer 04/2012   right breast   Bruises easily    Cancer 03/25/2012   right breast   Chronic back pain    arthritis   Elevated cholesterol    takes Crestor daily   Headache(784.0)    Hemorrhoids    History of chemotherapy    x 1,  pt unable to tolerate, on PO chemotherpay   Hypertension    takes Maxzide daily    Phlebitis 74yrs ago   hx of-   Seasonal allergies    takes Zyrtec daily and Nasal Spray prn   Past Surgical History:  Procedure Laterality Date   APPENDECTOMY  44yrs ago   BREAST EXCISIONAL BIOPSY Left    BREAST SURGERY  04/27/12   Right mod mastectomy, ER/PR +, HER2 -   BREAST SURGERY  04/27/12   Left Breast NL lumpectomy-no malignancy   COLONOSCOPY     MASTECTOMY Right 2013   PORT-A-CATH REMOVAL  11/24/2012   Procedure: REMOVAL PORT-A-CATH;  Surgeon: [name], DO;  Location: [facility];  Service: General;  Laterality: Left;   PORTACATH PLACEMENT  04/26/2012   Procedure: INSERTION PORT-A-CATH;  Surgeon: [name], DO;  Location: [facility] OR;  Service: General;  Laterality: Left;  Started at  [age].   TUBAL LIGATION  61yrs ago   Family History  Problem Relation Age of Onset   Cancer Mother        ovarian cancer   Leukemia Father    Cancer Father        leukemia   Cancer Sister 73       colon cancer   Cancer Brother 30       colon cancer   Cancer Brother        prostate cancer, deceased   Anesthesia problems Neg Hx    Hypotension Neg Hx    Malignant hyperthermia Neg Hx    Pseudochol deficiency Neg Hx    Social History   Socioeconomic History   Marital status: Married    Spouse name: Not on file   Number of children: Not on file   Years of education: Not on file   Highest education level: Not on file  Occupational History   Not on file  Tobacco Use   Smoking status: Former    Types: Cigarettes    Quit date: 01/10/1984    Years since quitting: 39.2   Smokeless tobacco: Never   Tobacco comments:    quit 30+yrs ago  Substance and Sexual Activity   Alcohol use: No   Drug use: No   Sexual activity: Yes    Birth control/protection: Post-menopausal    Comment: menarche age 84, P5,, 1st pregancy 53, menop 54, no HRT  Other Topics Concern   Not on file  Social History Narrative   Not on file   Social Determinants of Health   Financial Resource Strain: Medium Risk (04/15/2023)   Overall Financial Resource Strain (CARDIA)    Difficulty of Paying Living Expenses: Somewhat hard  Food Insecurity: No Food Insecurity (04/15/2023)   Hunger Vital Sign    Worried About Running Out of Food in the Last Year: Never true    Ran Out of Food in the Last Year: Never true  Transportation Needs: No Transportation Needs (04/15/2023)   PRAPARE - Administrator, Civil Service (Medical): No    Lack of Transportation (Non-Medical): No  Physical Activity: Inactive (04/15/2023)   Exercise Vital Sign    Days of Exercise per Week: 0 days    Minutes of Exercise per Session: 0 min  Stress: No Stress Concern Present (04/15/2023)   [employer] of Occupational Health -  Occupational Stress Questionnaire    Feeling of Stress : Only a little  Social Connections: Socially Integrated (04/15/2023)   Social Connection and Isolation Panel [NHANES]  Frequency of Communication with Friends and Family: More than three times a week    Frequency of Social Gatherings with Friends and Family: More than three times a week    Attends Religious Services: More than 4 times per year    Active Member of [employer] or Organizations: Yes    Attends Banker Meetings: Never    Marital Status: Married    Tobacco Counseling Counseling given: Not Answered Tobacco comments: quit 30+yrs ago   Clinical Intake:  Pre-visit preparation completed: Yes  Pain : 0-10 Pain Score: 8  Pain Type: Acute pain Pain Location: Leg Pain Orientation: Right Pain Descriptors / Indicators: Constant, Sharp Pain Onset: 1 to 4 weeks ago Pain Frequency: Constant Pain Relieving Factors: tylenol  Pain Relieving Factors: tylenol  Nutritional Risks: None Diabetes: Yes CBG done?: Yes CBG resulted in Enter/ Edit results?: Yes Did pt. bring in CBG monitor from home?: No  How often do you need to have someone help you when you read instructions, pamphlets, or other written materials from your doctor or pharmacy?: 3 - Sometimes What is the last grade level you completed in school?: 11th grade  Diabetic?Nutrition Risk Assessment:  Has the patient had any N/V/D within the last 2 months?  No  Does the patient have any non-healing wounds?  No  Has the patient had any unintentional weight loss or weight gain?  No   Diabetes:  Is the patient diabetic?  Yes  If diabetic, was a CBG obtained today?  Yes  Did the patient bring in their glucometer from home?  No   Financial Strains and Diabetes Management:  Are you having any financial strains with the device, your supplies or your medication? No .  Does the patient want to be seen by Chronic Care Management for management of their  diabetes?  No  Would the patient like to be referred to a Nutritionist or for Diabetic Management?  No   Diabetic Exams:  Diabetic Eye Exam: Completed 02/12/2023 Diabetic Foot Exam: Completed 03/30/2023    Interpreter Needed?: No  Information entered by :: Klynn Linnemann,cma   Activities of Daily Living    04/15/2023   10:04 AM 04/15/2023    9:56 AM  In your present state of health, do you have any difficulty performing the following activities:  Hearing? 0 0  Vision? 0 0  Difficulty concentrating or making decisions? 0 0  Walking or climbing stairs? 0 0  Dressing or bathing? 0 0  Doing errands, shopping? 1 1  Comment  dont drive    Patient Care Team: Lyndle Herrlich, MD as PCP - General Dorothy Puffer, MD as Consulting Physician (Radiation Oncology) Magrinat, Valentino Hue, MD (Inactive) as Consulting Physician (Medical Oncology)  Indicate any recent Medical Services you may have received from other than [facility] providers in the past year (date may be approximate).     Assessment:   This is a routine wellness examination for [place].  Hearing/Vision screen No results found.  Dietary issues and exercise activities discussed:     Goals Addressed   None   Depression Screen    04/15/2023   10:05 AM 04/15/2023    9:55 AM 01/29/2023   10:55 AM 01/01/2023    9:58 AM  PHQ 2/9 Scores  PHQ - 2 Score 0 0 0 0  PHQ- 9 Score 0 0 0 0    Fall Risk    04/15/2023   10:03 AM 04/15/2023    9:56 AM 01/29/2023  10:47 AM 01/01/2023    9:48 AM  Fall Risk   Falls in the past year? 0 0 0 0  Number falls in past yr: 0  0 0  Injury with Fall? 0  0 0  Risk for fall due to : Impaired balance/gait Impaired balance/gait No Fall Risks   Follow up Falls evaluation completed;Falls prevention discussed  Falls evaluation completed;Falls prevention discussed Falls evaluation completed    FALL RISK PREVENTION PERTAINING TO THE HOME:  Any stairs in or around the home? Yes  If so, are there any  without handrails? Yes  Home free of loose throw rugs in walkways, pet beds, electrical cords, etc? No  Adequate lighting in your home to reduce risk of falls? Yes   ASSISTIVE DEVICES UTILIZED TO PREVENT FALLS:  Life alert? No  Use of a cane, walker or w/c? Yes  Grab bars in the bathroom? No  Shower chair or bench in shower? No  Elevated toilet seat or a handicapped toilet? No   TIMED UP AND GO:  Was the test performed? No .  Length of time to ambulate 10 feet: 0 sec.   Gait slow and steady without use of assistive device  Cognitive Function:        Immunizations  There is no immunization history on file for this patient.  TDAP status: Due, Education has been provided regarding the importance of this vaccine. Advised may receive this vaccine at local pharmacy or Health Dept. Aware to provide a copy of the vaccination record if obtained from local pharmacy or Health Dept. Verbalized acceptance and understanding.  Flu Vaccine status: Up to date  Pneumococcal vaccine status: Due, Education has been provided regarding the importance of this vaccine. Advised may receive this vaccine at local pharmacy or Health Dept. Aware to provide a copy of the vaccination record if obtained from local pharmacy or Health Dept. Verbalized acceptance and understanding.  Covid-19 vaccine status: Information provided on how to obtain vaccines.   Qualifies for Shingles Vaccine? No   Zostavax completed No   Shingrix Completed?: No.    Education has been provided regarding the importance of this vaccine. Patient has been advised to call insurance company to determine out of pocket expense if they have not yet received this vaccine. Advised may also receive vaccine at local pharmacy or Health Dept. Verbalized acceptance and understanding.  Screening Tests Health Maintenance  Topic Date Due   COVID-19 Vaccine (1) Never done   Hepatitis C Screening  Never done   DTaP/Tdap/Td (1 - Tdap) Never done    Zoster Vaccines- Shingrix (1 of 2) 05/22/2023 (Originally 01/26/1965)   Pneumonia Vaccine 75+ Years old (1 of 1 - PCV) 07/21/2024 (Originally 01/27/2012)   INFLUENZA VACCINE  07/22/2023   HEMOGLOBIN A1C  09/29/2023   Diabetic kidney evaluation - Urine ACR  01/02/2024   OPHTHALMOLOGY EXAM  02/13/2024   FOOT EXAM  03/29/2024   Diabetic kidney evaluation - eGFR measurement  04/13/2024   Medicare Annual Wellness (AWV)  04/13/2024   DEXA SCAN  Completed   HPV VACCINES  Aged Out    Health Maintenance  Health Maintenance Due  Topic Date Due   COVID-19 Vaccine (1) Never done   Hepatitis C Screening  Never done   DTaP/Tdap/Td (1 - Tdap) Never done   Bone Density status: Completed 11/11/2016. Results reflect: Bone density results: OSTEOPENIA. Repeat every 0 years.  Lung Cancer Screening: (Low Dose CT Chest recommended if Age 17-80 years, 30 pack-year currently  smoking OR have quit w/in 15years.) does not qualify.   Lung Cancer Screening Referral: N/A  Additional Screening:  Hepatitis C Screening: does not qualify; Completed N/A  Vision Screening: Recommended annual ophthalmology exams for early detection of glaucoma and other disorders of the eye. Is the patient up to date with their annual eye exam?  Yes  Who is the provider or what is the name of the office in which the patient attends annual eye exams? Dr.Van If pt is not established with a provider, would they like to be referred to a provider to establish care? No .   Dental Screening: Recommended annual dental exams for proper oral hygiene  Community Resource Referral / Chronic Care Management: CRR required this visit?  No   CCM required this visit?  No      Plan:     I have personally reviewed and noted the following in the patient's chart:   Medical and social history Use of alcohol, tobacco or illicit drugs  Current medications and supplements including opioid prescriptions. Patient is currently taking opioid  prescriptions. Information provided to patient regarding non-opioid alternatives. Patient advised to discuss non-opioid treatment plan with their provider. Functional ability and status Nutritional status Physical activity Advanced directives List of other physicians Hospitalizations, surgeries, and ER visits in previous 12 months Vitals Screenings to include cognitive, depression, and falls Referrals and appointments  In addition, I have reviewed and discussed with patient certain preventive protocols, quality metrics, and best practice recommendations. A written personalized care plan for preventive services as well as general preventive health recommendations were provided to patient.     Cala Bradford, [facility]   04/15/2023   Nurse Notes: Face-To-Face Visit  Ms. Kernes , Thank you for taking time to come for your Medicare Wellness Visit. I appreciate your ongoing commitment to your health goals. Please review the following plan we discussed and let me know if I can assist you in the future.   These are the goals we discussed:  Goals   None     This is a list of the screening recommended for you and due dates:  Health Maintenance  Topic Date Due   COVID-19 Vaccine (1) Never done   Hepatitis C Screening: USPSTF Recommendation to screen - Ages 46-79 yo.  Never done   DTaP/Tdap/Td vaccine (1 - Tdap) Never done   Zoster (Shingles) Vaccine (1 of 2) 05/22/2023*   Pneumonia Vaccine (1 of 1 - PCV) 07/21/2024*   Flu Shot  07/22/2023   Hemoglobin A1C  09/29/2023   Yearly kidney health urinalysis for diabetes  01/02/2024   Eye exam for diabetics  02/13/2024   Complete foot exam   03/29/2024   Yearly kidney function blood test for diabetes  04/13/2024   Medicare Annual Wellness Visit  04/13/2024   DEXA scan (bone density measurement)  Completed   HPV Vaccine  Aged Out  *Topic was postponed. The date shown is not the original due date.

## 2023-04-15 NOTE — Progress Notes (Signed)
Patient called.  Patient aware. No DVT in RLE but there is a popliteal cyst noted.

## 2023-04-15 NOTE — Progress Notes (Addendum)
Patient called.  Patient aware. Kidney function stable and electrolytes largely stable. She does have some mild metabolic acidosis with bicarb 19. Anion gap is elevated but this has been noted over the past 3 months, does not appear acute. Glucose 97. She does have some mild hypercalcemia as well, but this was noted on labs 3 months ago and subsequently normalized. Will repeat BMP at next visit to ensure improvement. Have counseled on adequate hydration while at home.  She does have a leukocytosis on CBC, likely from cellulitis. She has started her bactrim. Will f/u in 1.5 weeks.  Venous doppler study without DVT, which is reassuring. Was found to have a popliteal cyst in R knee, unclear chronicity.

## 2023-04-15 NOTE — Progress Notes (Signed)
Internal Medicine Clinic Attending  Case discussed with Dr. Austin Miles  At the time of the visit.  We reviewed the resident's history and exam and pertinent patient test results.  I agree with the assessment, diagnosis, and plan of care documented in the resident's note.     I share Dr. Everardo Pacific concern for possible DVT - agree with lower extremity doppler.

## 2023-04-15 NOTE — Addendum Note (Signed)
Addended by: Merrilyn Puma on: 04/15/2023 09:33 AM   Modules accepted: Orders

## 2023-04-15 NOTE — Addendum Note (Signed)
Addended by: Merrilyn Puma on: 04/15/2023 02:04 PM   Modules accepted: Level of Service

## 2023-04-16 ENCOUNTER — Encounter (HOSPITAL_COMMUNITY): Payer: Self-pay | Admitting: *Deleted

## 2023-04-16 ENCOUNTER — Other Ambulatory Visit: Payer: Self-pay

## 2023-04-16 ENCOUNTER — Inpatient Hospital Stay (HOSPITAL_COMMUNITY)
Admission: EM | Admit: 2023-04-16 | Discharge: 2023-04-19 | DRG: 760 | Disposition: A | Discharge status: Home / Self Care | Payer: Medicare Other | Attending: Student in an Organized Health Care Education/Training Program | Admitting: Student in an Organized Health Care Education/Training Program

## 2023-04-16 DIAGNOSIS — G8929 Other chronic pain: Secondary | ICD-10-CM | POA: Diagnosis present

## 2023-04-16 DIAGNOSIS — N179 Acute kidney failure, unspecified: Secondary | ICD-10-CM | POA: Diagnosis present

## 2023-04-16 DIAGNOSIS — I872 Venous insufficiency (chronic) (peripheral): Secondary | ICD-10-CM | POA: Diagnosis present

## 2023-04-16 DIAGNOSIS — D6862 Lupus anticoagulant syndrome: Secondary | ICD-10-CM | POA: Diagnosis present

## 2023-04-16 DIAGNOSIS — Z9049 Acquired absence of other specified parts of digestive tract: Secondary | ICD-10-CM

## 2023-04-16 DIAGNOSIS — Z86718 Personal history of other venous thrombosis and embolism: Secondary | ICD-10-CM

## 2023-04-16 DIAGNOSIS — M7121 Synovial cyst of popliteal space [Baker], right knee: Secondary | ICD-10-CM | POA: Diagnosis present

## 2023-04-16 DIAGNOSIS — Z9851 Tubal ligation status: Secondary | ICD-10-CM

## 2023-04-16 DIAGNOSIS — N950 Postmenopausal bleeding: Principal | ICD-10-CM | POA: Diagnosis present

## 2023-04-16 DIAGNOSIS — C541 Malignant neoplasm of endometrium: Secondary | ICD-10-CM | POA: Diagnosis present

## 2023-04-16 DIAGNOSIS — Z7901 Long term (current) use of anticoagulants: Secondary | ICD-10-CM

## 2023-04-16 DIAGNOSIS — N858 Other specified noninflammatory disorders of uterus: Secondary | ICD-10-CM | POA: Diagnosis present

## 2023-04-16 DIAGNOSIS — E7800 Pure hypercholesterolemia, unspecified: Secondary | ICD-10-CM | POA: Diagnosis present

## 2023-04-16 DIAGNOSIS — D649 Anemia, unspecified: Secondary | ICD-10-CM | POA: Diagnosis present

## 2023-04-16 DIAGNOSIS — Z8672 Personal history of thrombophlebitis: Secondary | ICD-10-CM

## 2023-04-16 DIAGNOSIS — Z7984 Long term (current) use of oral hypoglycemic drugs: Secondary | ICD-10-CM

## 2023-04-16 DIAGNOSIS — M549 Dorsalgia, unspecified: Secondary | ICD-10-CM | POA: Diagnosis present

## 2023-04-16 DIAGNOSIS — Z9011 Acquired absence of right breast and nipple: Secondary | ICD-10-CM

## 2023-04-16 DIAGNOSIS — Z9221 Personal history of antineoplastic chemotherapy: Secondary | ICD-10-CM

## 2023-04-16 DIAGNOSIS — Z87891 Personal history of nicotine dependence: Secondary | ICD-10-CM

## 2023-04-16 DIAGNOSIS — Z79899 Other long term (current) drug therapy: Secondary | ICD-10-CM

## 2023-04-16 DIAGNOSIS — Q825 Congenital non-neoplastic nevus: Secondary | ICD-10-CM

## 2023-04-16 DIAGNOSIS — Z880 Allergy status to penicillin: Secondary | ICD-10-CM

## 2023-04-16 DIAGNOSIS — E119 Type 2 diabetes mellitus without complications: Secondary | ICD-10-CM | POA: Diagnosis present

## 2023-04-16 DIAGNOSIS — R8271 Bacteriuria: Secondary | ICD-10-CM | POA: Diagnosis present

## 2023-04-16 DIAGNOSIS — K921 Melena: Secondary | ICD-10-CM | POA: Diagnosis present

## 2023-04-16 DIAGNOSIS — R3121 Asymptomatic microscopic hematuria: Secondary | ICD-10-CM | POA: Diagnosis present

## 2023-04-16 DIAGNOSIS — I10 Essential (primary) hypertension: Secondary | ICD-10-CM | POA: Diagnosis present

## 2023-04-16 DIAGNOSIS — Z853 Personal history of malignant neoplasm of breast: Secondary | ICD-10-CM

## 2023-04-16 DIAGNOSIS — E1129 Type 2 diabetes mellitus with other diabetic kidney complication: Secondary | ICD-10-CM | POA: Diagnosis present

## 2023-04-16 DIAGNOSIS — K922 Gastrointestinal hemorrhage, unspecified: Principal | ICD-10-CM

## 2023-04-16 DIAGNOSIS — N939 Abnormal uterine and vaginal bleeding, unspecified: Secondary | ICD-10-CM

## 2023-04-16 DIAGNOSIS — R809 Proteinuria, unspecified: Secondary | ICD-10-CM | POA: Diagnosis present

## 2023-04-16 DIAGNOSIS — Z5986 Financial insecurity: Secondary | ICD-10-CM

## 2023-04-16 DIAGNOSIS — L03115 Cellulitis of right lower limb: Secondary | ICD-10-CM | POA: Diagnosis present

## 2023-04-16 LAB — URINALYSIS, ROUTINE W REFLEX MICROSCOPIC
Bilirubin Urine: NEGATIVE
Glucose, UA: >=500 mg/dL — AB
Ketones, ur: NEGATIVE mg/dL
Nitrite: NEGATIVE
Protein, ur: NEGATIVE mg/dL
Specific Gravity, Urine: 1.014 (ref 1.005–1.030)
pH: 5 (ref 5.0–8.0)

## 2023-04-16 LAB — CBC
HCT: 36.8 % (ref 36.0–46.0)
Hemoglobin: 12.2 g/dL (ref 12.0–15.0)
MCH: 28.9 pg (ref 26.0–34.0)
MCHC: 33.2 g/dL (ref 30.0–36.0)
MCV: 87.2 fL (ref 80.0–100.0)
Platelets: 258 10*3/uL (ref 150–400)
RBC: 4.22 MIL/uL (ref 3.87–5.11)
RDW: 15.1 % (ref 11.5–15.5)
WBC: 10.6 10*3/uL — ABNORMAL HIGH (ref 4.0–10.5)
nRBC: 0 % (ref 0.0–0.2)

## 2023-04-16 LAB — COMPREHENSIVE METABOLIC PANEL WITH GFR
ALT: 20 U/L (ref 0–44)
AST: 23 U/L (ref 15–41)
Albumin: 3.6 g/dL (ref 3.5–5.0)
Alkaline Phosphatase: 53 U/L (ref 38–126)
Anion gap: 11 (ref 5–15)
BUN: 15 mg/dL (ref 8–23)
CO2: 25 mmol/L (ref 22–32)
Calcium: 9.6 mg/dL (ref 8.9–10.3)
Chloride: 101 mmol/L (ref 98–111)
Creatinine, Ser: 1.45 mg/dL — ABNORMAL HIGH (ref 0.44–1.00)
GFR, Estimated: 37 mL/min — ABNORMAL LOW
Glucose, Bld: 115 mg/dL — ABNORMAL HIGH (ref 70–99)
Potassium: 3.3 mmol/L — ABNORMAL LOW (ref 3.5–5.1)
Sodium: 137 mmol/L (ref 135–145)
Total Bilirubin: 0.8 mg/dL (ref 0.3–1.2)
Total Protein: 7.3 g/dL (ref 6.5–8.1)

## 2023-04-16 LAB — TYPE AND SCREEN
ABO/RH(D): A POS
Antibody Screen: NEGATIVE

## 2023-04-16 NOTE — ED Provider Notes (Signed)
[place] EMERGENCY DEPARTMENT AT [facility] Provider Note   CSN: [id number] Arrival date & time: 04/16/23  2129     History {Add pertinent medical, surgical, social history, OB history to HPI:1} Chief Complaint  Patient presents with   Vaginal Bleeding    Yvonne [name] is a 77 y.o. female.  Patient presents to the emergency department for evaluation of bleeding.  Patient reports that her doctor started her on Xarelto today.  She is not sure why she was started on Xarelto.  Patient reports that she wears depends at night and she noted that it was filled with blood.       Home Medications Prior to Admission medications   Medication Sig Start Date End Date Taking? Authorizing Provider  amLODipine (NORVASC) 5 MG tablet Take 1 tablet (5 mg total) by mouth daily. 04/14/23 04/13/24  Merrilyn Puma, MD  diclofenac Sodium (VOLTAREN) 1 % GEL Apply 2 g topically 3 (three) times daily as needed (knee pain). 04/14/23   Merrilyn Puma, MD  empagliflozin (JARDIANCE) 10 MG TABS tablet Take 1 tablet (10 mg total) by mouth daily before breakfast. 04/14/23   Merrilyn Puma, MD  gabapentin (NEURONTIN) 300 MG capsule Take 3 capsules (900 mg total) by mouth at bedtime. 03/30/23   Rana Snare, DO  glucose blood ([employer] GLUCOSE METER TEST STRIPS) test strip Use as instructed 01/29/23   Mapp, Gaylyn Cheers, MD  Lancets (FREESTYLE) lancets 1 each by Other route daily. 01/29/23   Mapp, Gaylyn Cheers, MD  losartan (COZAAR) 25 MG tablet Take 1 tablet (25 mg total) by mouth daily. 04/14/23 04/13/24  Merrilyn Puma, MD  metFORMIN (GLUCOPHAGE) 500 MG tablet Take 0.5 tablets (250 mg total) by mouth 2 (two) times daily with a meal. 01/01/23   Lyndle Herrlich, MD  potassium chloride SA (KLOR-CON M) 20 MEQ tablet Take 1 tablet (20 mEq total) by mouth daily for 10 days. 03/18/23 03/28/23  Atway, Derwood Kaplan, DO  rivaroxaban (XARELTO) 20 MG TABS tablet Take 1 tablet (20 mg total) by mouth daily with supper. 04/15/23   Merrilyn Puma, MD  rosuvastatin (CRESTOR) 40 MG tablet Take 1 tablet (40 mg total) by mouth daily. 01/01/23   Lyndle Herrlich, MD  sulfamethoxazole-trimethoprim (BACTRIM DS) 800-160 MG tablet Take 1 tablet by mouth 2 (two) times daily. 04/14/23   Merrilyn Puma, MD  Vitamin D, Ergocalciferol, (DRISDOL) 1.25 MG (50000 UNIT) CAPS capsule Take 1 capsule (50,000 Units total) by mouth every 7 (seven) days. Sunday 01/29/23   Karoline Caldwell, MD      Allergies    Penicillins    Review of Systems   Review of Systems  Physical Exam Updated Vital Signs BP 127/64   Pulse 62   Temp 99 F (37.2 C)   Resp 10   Ht 5\' 5"  (1.651 m)   Wt 85.3 kg   SpO2 98%   BMI 31.29 kg/m  Physical Exam Vitals and nursing note reviewed. Exam conducted with a chaperone present.  Constitutional:      General: She is not in acute distress.    Appearance: She is well-developed.  HENT:     Head: Normocephalic and atraumatic.     Mouth/Throat:     Mouth: Mucous membranes are moist.  Eyes:     General: Vision grossly intact. Gaze aligned appropriately.     Extraocular Movements: Extraocular movements intact.     Conjunctiva/sclera: Conjunctivae normal.  Cardiovascular:     Rate and Rhythm: Normal rate and regular rhythm.  Pulses: Normal pulses.     Heart sounds: Normal heart sounds, S1 normal and S2 normal. No murmur heard.    No friction rub. No gallop.  Pulmonary:     Effort: Pulmonary effort is normal. No respiratory distress.     Breath sounds: Normal breath sounds.  Abdominal:     General: Bowel sounds are normal.     Palpations: Abdomen is soft.     Tenderness: There is no abdominal tenderness. There is no guarding or rebound.     Hernia: No hernia is present.  Genitourinary:    Rectum: Guaiac result positive (gross blood).  Musculoskeletal:        General: No swelling.     Cervical back: Full passive range of motion without pain, normal range of motion and neck supple. No spinous process tenderness or  muscular tenderness. Normal range of motion.     Right lower leg: No edema.     Left lower leg: No edema.  Skin:    General: Skin is warm and dry.     Capillary Refill: Capillary refill takes less than 2 seconds.     Findings: No ecchymosis, erythema, rash or wound.  Neurological:     General: No focal deficit present.     Mental Status: She is alert and oriented to person, place, and time.     GCS: GCS eye subscore is 4. GCS verbal subscore is 5. GCS motor subscore is 6.     Cranial Nerves: Cranial nerves 2-12 are intact.     Sensory: Sensation is intact.     Motor: Motor function is intact.     Coordination: Coordination is intact.  Psychiatric:        Attention and Perception: Attention normal.        Mood and Affect: Mood normal.        Speech: Speech normal.        Behavior: Behavior normal.     ED Results / Procedures / Treatments   Labs (all labs ordered are listed, but only abnormal results are displayed) Labs Reviewed  COMPREHENSIVE METABOLIC PANEL - Abnormal; Notable for the following components:      Result Value   Potassium 3.3 (*)    Glucose, Bld 115 (*)    Creatinine, Ser 1.45 (*)    GFR, Estimated 37 (*)    All other components within normal limits  CBC - Abnormal; Notable for the following components:   WBC 10.6 (*)    All other components within normal limits  URINALYSIS, ROUTINE W REFLEX MICROSCOPIC  POC OCCULT BLOOD, ED  TYPE AND SCREEN  ABO/RH    EKG None  Radiology VAS [place] LOWER EXTREMITY VENOUS (DVT)  Result Date: 04/16/2023  Lower Venous DVT Study Patient Name:  Yvonne [name]  Date of Exam:   04/15/2023 Medical Rec #: [id number]           Accession #:    [id number] Date of Birth: [birth year]            Patient Gender: F Patient Age:   61 years Exam Location:  [facility] Procedure:      VAS [place] LOWER EXTREMITY VENOUS (DVT) Referring Phys: ERIK HOFFMAN --------------------------------------------------------------------------------   Indications: Right leg pain and erythema, most significant at knee.  Comparison Study: No prior studies. Performing Technologist: Jean Rosenthal RDMS, RVT  Examination Guidelines: A complete evaluation includes B-mode imaging, spectral Doppler, color Doppler, and power Doppler as needed of all accessible portions of  each vessel. Bilateral testing is considered an integral part of a complete examination. Limited examinations for reoccurring indications may be performed as noted. The reflux portion of the exam is performed with the patient in reverse Trendelenburg.  +---------+---------------+---------+-----------+----------+--------------+ RIGHT    CompressibilityPhasicitySpontaneityPropertiesThrombus Aging +---------+---------------+---------+-----------+----------+--------------+ CFV      Full           Yes      Yes                                 +---------+---------------+---------+-----------+----------+--------------+ SFJ      Full                                                        +---------+---------------+---------+-----------+----------+--------------+ FV Prox  Full                                                        +---------+---------------+---------+-----------+----------+--------------+ FV Mid   Full                                                        +---------+---------------+---------+-----------+----------+--------------+ FV DistalFull                                                        +---------+---------------+---------+-----------+----------+--------------+ PFV      Full                                                        +---------+---------------+---------+-----------+----------+--------------+ POP      Full           Yes      Yes                                 +---------+---------------+---------+-----------+----------+--------------+ PTV      Full                                                         +---------+---------------+---------+-----------+----------+--------------+ PERO     Full                                                        +---------+---------------+---------+-----------+----------+--------------+   +----+---------------+---------+-----------+----------+--------------+ LEFTCompressibilityPhasicitySpontaneityPropertiesThrombus Aging +----+---------------+---------+-----------+----------+--------------+ CFV Full  Yes      Yes                                 +----+---------------+---------+-----------+----------+--------------+     Summary: RIGHT: - There is no evidence of deep vein thrombosis in the lower extremity.  - A cystic structure is found in the popliteal fossa. 4.9 x 3.3 cm.  LEFT: - No evidence of common femoral vein obstruction.  *See table(s) above for measurements and observations. Electronically signed by Gerarda Fraction on 04/16/2023 at 4:34:19 PM.    Final     Procedures Procedures  {Document cardiac monitor, telemetry assessment procedure when appropriate:1}  Medications Ordered in ED Medications - No data to display  ED Course/ Medical Decision Making/ A&P   {   Click here for ABCD2, HEART and other calculatorsREFRESH Note before signing :1}                          Medical Decision Making  Patient presents to the emergency department tonight with bleeding.  Patient reports that she had blood in her brief and when she sat on the toilet additional blood came out.  She was not sure the source.  Patient has a 2 g drop in her hemoglobin in 1 day.  Rectal exam revealed gross blood, this is the source of the blood.  Reports starting Xarelto 20 mg daily tonight.  Reviewing her records reveals that she has had a history of DVT x 2 in the past.  She reports that she was previously on a blood thinner when she was being seen at [facility] but it was stopped.  It appears that her medications were reviewed during office visit on  April 24 and at that time she was prescribed Xarelto single daily dosing.  {Document critical care time when appropriate:1} {Document review of labs and clinical decision tools ie heart score, Chads2Vasc2 etc:1}  {Document your independent review of radiology images, and any outside records:1} {Document your discussion with family members, caretakers, and with consultants:1} {Document social determinants of health affecting pt's care:1} {Document your decision making why or why not admission, treatments were needed:1} Final Clinical Impression(s) / ED Diagnoses Final diagnoses:  Gastrointestinal hemorrhage, unspecified gastrointestinal hemorrhage type    Rx / DC Orders ED Discharge Orders     None

## 2023-04-16 NOTE — ED Triage Notes (Signed)
The pt has had vaginal bleeding that started today  she was just placed on a blood thinner today

## 2023-04-16 NOTE — ED Notes (Addendum)
Pt states she started new rx Xarelto 20mg  daily yesterday and has taken a total of 2 doses. She went to the restroom earlier this evening and noticed what she describes as very heavy vaginal bleeding, painless. She used a maxi pad which had to be changed after about 2 hours due to saturation. Current pad has been in place since around 9pm. Pt is many years postmenopausal.  RN assisted pt into gown, placed on monitor, and positioned in bed with warm blankets for comfort. NAD at this time

## 2023-04-17 ENCOUNTER — Observation Stay (HOSPITAL_COMMUNITY): Payer: Medicare Other

## 2023-04-17 ENCOUNTER — Encounter (HOSPITAL_COMMUNITY): Payer: Self-pay | Admitting: Student in an Organized Health Care Education/Training Program

## 2023-04-17 DIAGNOSIS — L03115 Cellulitis of right lower limb: Secondary | ICD-10-CM

## 2023-04-17 DIAGNOSIS — I361 Nonrheumatic tricuspid (valve) insufficiency: Secondary | ICD-10-CM | POA: Diagnosis not present

## 2023-04-17 DIAGNOSIS — M549 Dorsalgia, unspecified: Secondary | ICD-10-CM | POA: Diagnosis present

## 2023-04-17 DIAGNOSIS — D6862 Lupus anticoagulant syndrome: Secondary | ICD-10-CM | POA: Diagnosis present

## 2023-04-17 DIAGNOSIS — G8929 Other chronic pain: Secondary | ICD-10-CM | POA: Diagnosis present

## 2023-04-17 DIAGNOSIS — Z853 Personal history of malignant neoplasm of breast: Secondary | ICD-10-CM | POA: Diagnosis not present

## 2023-04-17 DIAGNOSIS — N858 Other specified noninflammatory disorders of uterus: Secondary | ICD-10-CM | POA: Diagnosis present

## 2023-04-17 DIAGNOSIS — N939 Abnormal uterine and vaginal bleeding, unspecified: Secondary | ICD-10-CM | POA: Diagnosis not present

## 2023-04-17 DIAGNOSIS — K922 Gastrointestinal hemorrhage, unspecified: Secondary | ICD-10-CM | POA: Diagnosis present

## 2023-04-17 DIAGNOSIS — R3121 Asymptomatic microscopic hematuria: Secondary | ICD-10-CM | POA: Diagnosis present

## 2023-04-17 DIAGNOSIS — Z87891 Personal history of nicotine dependence: Secondary | ICD-10-CM | POA: Diagnosis not present

## 2023-04-17 DIAGNOSIS — Z79899 Other long term (current) drug therapy: Secondary | ICD-10-CM | POA: Diagnosis not present

## 2023-04-17 DIAGNOSIS — K625 Hemorrhage of anus and rectum: Secondary | ICD-10-CM

## 2023-04-17 DIAGNOSIS — Z7984 Long term (current) use of oral hypoglycemic drugs: Secondary | ICD-10-CM | POA: Diagnosis not present

## 2023-04-17 DIAGNOSIS — Z7901 Long term (current) use of anticoagulants: Secondary | ICD-10-CM | POA: Diagnosis not present

## 2023-04-17 DIAGNOSIS — I10 Essential (primary) hypertension: Secondary | ICD-10-CM | POA: Diagnosis present

## 2023-04-17 DIAGNOSIS — D649 Anemia, unspecified: Secondary | ICD-10-CM | POA: Diagnosis present

## 2023-04-17 DIAGNOSIS — Z86718 Personal history of other venous thrombosis and embolism: Secondary | ICD-10-CM | POA: Diagnosis not present

## 2023-04-17 DIAGNOSIS — C541 Malignant neoplasm of endometrium: Secondary | ICD-10-CM | POA: Diagnosis present

## 2023-04-17 DIAGNOSIS — N179 Acute kidney failure, unspecified: Secondary | ICD-10-CM | POA: Diagnosis present

## 2023-04-17 DIAGNOSIS — Z800 Family history of malignant neoplasm of digestive organs: Secondary | ICD-10-CM

## 2023-04-17 DIAGNOSIS — R8271 Bacteriuria: Secondary | ICD-10-CM | POA: Diagnosis present

## 2023-04-17 DIAGNOSIS — Z9221 Personal history of antineoplastic chemotherapy: Secondary | ICD-10-CM | POA: Diagnosis not present

## 2023-04-17 DIAGNOSIS — K921 Melena: Secondary | ICD-10-CM | POA: Diagnosis present

## 2023-04-17 DIAGNOSIS — E1129 Type 2 diabetes mellitus with other diabetic kidney complication: Secondary | ICD-10-CM | POA: Diagnosis present

## 2023-04-17 DIAGNOSIS — I340 Nonrheumatic mitral (valve) insufficiency: Secondary | ICD-10-CM | POA: Diagnosis not present

## 2023-04-17 DIAGNOSIS — I872 Venous insufficiency (chronic) (peripheral): Secondary | ICD-10-CM | POA: Diagnosis present

## 2023-04-17 DIAGNOSIS — E1169 Type 2 diabetes mellitus with other specified complication: Secondary | ICD-10-CM

## 2023-04-17 DIAGNOSIS — N950 Postmenopausal bleeding: Secondary | ICD-10-CM | POA: Diagnosis present

## 2023-04-17 DIAGNOSIS — Z9011 Acquired absence of right breast and nipple: Secondary | ICD-10-CM | POA: Diagnosis not present

## 2023-04-17 DIAGNOSIS — M7121 Synovial cyst of popliteal space [Baker], right knee: Secondary | ICD-10-CM | POA: Diagnosis present

## 2023-04-17 DIAGNOSIS — E7800 Pure hypercholesterolemia, unspecified: Secondary | ICD-10-CM | POA: Diagnosis present

## 2023-04-17 HISTORY — DX: Malignant neoplasm of endometrium: C54.1

## 2023-04-17 HISTORY — DX: Cellulitis of right lower limb: L03.115

## 2023-04-17 LAB — URINALYSIS, ROUTINE W REFLEX MICROSCOPIC
Bacteria, UA: NONE SEEN
Bilirubin Urine: NEGATIVE
Glucose, UA: >=500 mg/dL — AB
Ketones, ur: NEGATIVE mg/dL
Leukocytes,Ua: NEGATIVE
Nitrite: NEGATIVE
Protein, ur: NEGATIVE mg/dL
Specific Gravity, Urine: 1.007 (ref 1.005–1.030)
pH: 6 (ref 5.0–8.0)

## 2023-04-17 LAB — CBC WITH DIFFERENTIAL/PLATELET
Abs Immature Granulocytes: 0.04 10*3/uL (ref 0.00–0.07)
Basophils Absolute: 0.1 10*3/uL (ref 0.0–0.1)
Basophils Relative: 1 %
Eosinophils Absolute: 0.4 10*3/uL (ref 0.0–0.5)
Eosinophils Relative: 3 %
HCT: 34.8 % — ABNORMAL LOW (ref 36.0–46.0)
Hemoglobin: 11.2 g/dL — ABNORMAL LOW (ref 12.0–15.0)
Immature Granulocytes: 0 %
Lymphocytes Relative: 30 %
Lymphs Abs: 3.4 10*3/uL (ref 0.7–4.0)
MCH: 28.4 pg (ref 26.0–34.0)
MCHC: 32.2 g/dL (ref 30.0–36.0)
MCV: 88.1 fL (ref 80.0–100.0)
Monocytes Absolute: 0.8 10*3/uL (ref 0.1–1.0)
Monocytes Relative: 7 %
Neutro Abs: 6.6 10*3/uL (ref 1.7–7.7)
Neutrophils Relative %: 59 %
Platelets: 247 10*3/uL (ref 150–400)
RBC: 3.95 MIL/uL (ref 3.87–5.11)
RDW: 15.1 % (ref 11.5–15.5)
WBC: 11.3 10*3/uL — ABNORMAL HIGH (ref 4.0–10.5)
nRBC: 0 % (ref 0.0–0.2)

## 2023-04-17 LAB — BASIC METABOLIC PANEL
Anion gap: 10 (ref 5–15)
BUN: 12 mg/dL (ref 8–23)
CO2: 23 mmol/L (ref 22–32)
Calcium: 9.3 mg/dL (ref 8.9–10.3)
Chloride: 102 mmol/L (ref 98–111)
Creatinine, Ser: 1.36 mg/dL — ABNORMAL HIGH (ref 0.44–1.00)
GFR, Estimated: 40 mL/min — ABNORMAL LOW (ref >60)
Glucose, Bld: 104 mg/dL — ABNORMAL HIGH (ref 70–99)
Potassium: 2.9 mmol/L — ABNORMAL LOW (ref 3.5–5.1)
Sodium: 135 mmol/L (ref 135–145)

## 2023-04-17 LAB — IRON AND TIBC
Iron: 46 ug/dL (ref 28–170)
Saturation Ratios: 14 % (ref 10.4–31.8)
TIBC: 329 ug/dL (ref 250–450)
UIBC: 283 ug/dL

## 2023-04-17 LAB — GLUCOSE, CAPILLARY
Glucose-Capillary: 109 mg/dL — ABNORMAL HIGH (ref 70–99)
Glucose-Capillary: 81 mg/dL (ref 70–99)
Glucose-Capillary: 74 mg/dL (ref 70–99)
Glucose-Capillary: 117 mg/dL — ABNORMAL HIGH (ref 70–99)

## 2023-04-17 LAB — APTT: aPTT: 43 seconds — ABNORMAL HIGH (ref 24–36)

## 2023-04-17 LAB — FERRITIN: Ferritin: 68 ng/mL (ref 11–307)

## 2023-04-17 LAB — PROTIME-INR
INR: 2.6 — ABNORMAL HIGH (ref 0.8–1.2)
Prothrombin Time: 28 seconds — ABNORMAL HIGH (ref 11.4–15.2)

## 2023-04-17 LAB — ABO/RH: ABO/RH(D): A POS

## 2023-04-17 MED ORDER — CEPHALEXIN 500 MG PO CAPS
500.0000 mg | ORAL_CAPSULE | Freq: Two times a day (BID) | ORAL | Status: DC
  Administered 2023-04-17 (×2): 500 mg via ORAL
  Filled 2023-04-17 (×2): qty 1

## 2023-04-17 MED ORDER — INSULIN ASPART 100 UNIT/ML IJ SOLN
0.0000 [IU] | Freq: Three times a day (TID) | INTRAMUSCULAR | Status: DC
  Administered 2023-04-18: 3 [IU] via SUBCUTANEOUS
  Administered 2023-04-19: 2 [IU] via SUBCUTANEOUS

## 2023-04-17 MED ORDER — POTASSIUM CHLORIDE CRYS ER 20 MEQ PO TBCR
40.0000 meq | EXTENDED_RELEASE_TABLET | Freq: Two times a day (BID) | ORAL | Status: AC
  Administered 2023-04-17 (×2): 40 meq via ORAL
  Filled 2023-04-17 (×2): qty 2

## 2023-04-17 MED ORDER — ROSUVASTATIN CALCIUM 20 MG PO TABS
40.0000 mg | ORAL_TABLET | Freq: Every day | ORAL | Status: DC
  Administered 2023-04-17 – 2023-04-19 (×3): 40 mg via ORAL
  Filled 2023-04-17 (×3): qty 2

## 2023-04-17 MED ORDER — CEPHALEXIN 500 MG PO CAPS
500.0000 mg | ORAL_CAPSULE | Freq: Two times a day (BID) | ORAL | Status: AC
  Administered 2023-04-18 – 2023-04-19 (×3): 500 mg via ORAL
  Filled 2023-04-17 (×3): qty 1

## 2023-04-17 MED ORDER — AMLODIPINE BESYLATE 5 MG PO TABS
5.0000 mg | ORAL_TABLET | Freq: Every day | ORAL | Status: DC
  Administered 2023-04-17 – 2023-04-19 (×3): 5 mg via ORAL
  Filled 2023-04-17 (×3): qty 1

## 2023-04-17 MED ORDER — CEPHALEXIN 500 MG PO CAPS: 500.0000 mg | ORAL_CAPSULE | Freq: Two times a day (BID) | ORAL | Status: DC

## 2023-04-17 MED ORDER — GABAPENTIN 300 MG PO CAPS
600.0000 mg | ORAL_CAPSULE | Freq: Every day | ORAL | Status: DC
  Administered 2023-04-17 – 2023-04-18 (×2): 600 mg via ORAL
  Filled 2023-04-17 (×2): qty 2

## 2023-04-17 MED ORDER — DICLOFENAC SODIUM 1 % EX GEL: 2.0000 g | Freq: Three times a day (TID) | CUTANEOUS | Status: DC | PRN

## 2023-04-17 NOTE — H&P (Addendum)
Date: 04/17/2023               Patient Name:  Yvonne [name] MRN: [id number]  DOB: 1946-05-12 Age / Sex: 77 y.o., female   PCP: Lyndle Herrlich, MD         Medical Service: Internal Medicine Teaching Service         Attending Physician: Dr. Oswaldo Done, Marquita Palms, *      First Contact: Dr. Karoline Caldwell, MD Pager [phone]    Second Contact: Dr. Elza Rafter, DO Pager [phone]         After Hours (After 5p/  First Contact Pager: [phone]  weekends / holidays): Second Contact Pager: [phone]   SUBJECTIVE   Chief Complaint: Hematochezia   History of Present Illness:  Yvonne [name] is a 77 year old female with a past medical history of T2DM, HTN, HLD, breast cancer s/p mastectomy and chemotherapy completed in 2018, provoked DVT, and unprovoked DVT in 2021 presenting after 1 episode of hematochezia.  Last night she had bright red blood in her underpants and pad and then a large amount in her toilet without associated orthostatic dizziness, abdominal or rectal pain, nausea, or vomiting.  She has not had this before and denies any blood on her toilet paper in the past either.  She was restarted on Xarelto 2 days ago in our clinic at 20 mg daily, last dose Friday morning.  She had stopped taking about 2 months ago and previously appeared to be on 10 mg daily after being switched from Eliquis due to hematuria.  She denies any known history of diverticulosis and her last colonoscopy was in 2013 which she says was normal.  She usually has 1-2 bowel movements per day which are soft and denies any melena or straining with defecation.  Occasionally takes laxatives but none recently.  She is also being treated for cellulitis of the right lower extremity after being seen in our clinic on 04/14/2023 with Bactrim.  She states that she has been taking this and that her leg does feel better but she still has some twinges of pain in her right knee.  Denies fever, chills, fatigue, dysuria,  hematuria.  ED Course: Presented hemodynamically stable with a 2 point drop in her hemoglobin from 14-12 over the past 2 days and bright red blood on rectal exam in the ED.  IMTS was paged for admission.  Past Medical History 2X DVT, provoked and unprovoked (2021) Breast cancer s/p mastectomy (2013) and chemotherapy completed in 2018 Hypertension Type 2 diabetes Hyperlipidemia Chronic back pain  Meds:  Amlodipine 5 mg daily Empagliflozin 10 mg daily Gabapentin 900 mg nightly Losartan 25 mg daily Metformin 250 mg twice daily Xarelto 20 mg daily Rosuvastatin 40 mg daily  Past Surgical History Past Surgical History:  Procedure Laterality Date   APPENDECTOMY  84yrs ago   BREAST EXCISIONAL BIOPSY Left    BREAST SURGERY  04/27/12   Right mod mastectomy, ER/PR +, HER2 -   BREAST SURGERY  04/27/12   Left Breast NL lumpectomy-no malignancy   COLONOSCOPY     MASTECTOMY Right 2013   PORT-A-CATH REMOVAL  11/24/2012   Procedure: REMOVAL PORT-A-CATH;  Surgeon: [name], DO;  Location: [facility];  Service: General;  Laterality: Left;   PORTACATH PLACEMENT  04/26/2012   Procedure: INSERTION PORT-A-CATH;  Surgeon: [name], DO;  Location: [facility] OR;  Service: General;  Laterality: Left;  Started at 1746.   TUBAL LIGATION  44yrs ago    Social:  Lives with husband here in [place] Support: Family in the area Level of Function: Dependent in some ADLs and IADLs, ambulates with a cane PCP: Lyndle Herrlich, MD Substances: Former smoker 40-pack-year history, quit 40 years ago.  Denies alcohol or drug use.  Family History:  Mother with uterine or ovarian cancer.  Father had leukemia.  Brother and sister with colon cancer.  Brother with prostate cancer.  Brother with cancer 2/2 agent orange.  Allergies: Allergies as of 04/16/2023 - Review Complete 04/16/2023  Allergen Reaction Noted   Penicillins Anaphylaxis 04/05/2012    Review of Systems: A complete ROS was  negative except as per HPI.   OBJECTIVE:   Physical Exam: Blood pressure 127/64, pulse 62, temperature 99 F (37.2 C), resp. rate 10, height 5\' 5"  (1.651 m), weight 85.3 kg, SpO2 98 %.  Constitutional: Well-appearing elderly female laying in bed. In no acute distress. HENT: Normocephalic, atraumatic,  Eyes: Sclera non-icteric, PERRL, EOM intact Cardio:Regular rate and rhythm. No murmurs, rubs, or gallops. 2+ bilateral radial and dorsalis pedis  pulses. Pulm:Clear to auscultation bilaterally. Normal work of breathing on room air. Abdomen: Soft, non-tender, non-distended, positive bowel sounds. MSK: Trace lower extremity edema Skin:Warm and dry. Neuro:Alert and oriented x3. No focal deficit noted. Psych:Pleasant mood and affect. GU: Rectal exam without internal/external hemorrhoids, blood, or tenderness.  Exterior vaginal exam without abnormalities.  Labs: CBC    Component Value Date/Time   WBC 10.6 (H) 04/16/2023 2240   RBC 4.22 04/16/2023 2240   HGB 12.2 04/16/2023 2240   HGB 14.3 04/14/2023 1541   HGB 13.5 11/09/2017 0849   HCT 36.8 04/16/2023 2240   HCT 43.9 04/14/2023 1541   HCT 41.4 11/09/2017 0849   PLT 258 04/16/2023 2240   PLT 282 04/14/2023 1541   MCV 87.2 04/16/2023 2240   MCV 88 04/14/2023 1541   MCV 88.7 11/09/2017 0849   MCH 28.9 04/16/2023 2240   MCHC 33.2 04/16/2023 2240   RDW 15.1 04/16/2023 2240   RDW 13.8 04/14/2023 1541   RDW 14.3 11/09/2017 0849   LYMPHSABS 3.4 (H) 04/14/2023 1541   LYMPHSABS 2.9 11/09/2017 0849   MONOABS 1.1 (H) 12/01/2022 0645   MONOABS 0.7 11/09/2017 0849   EOSABS 0.4 04/14/2023 1541   BASOSABS 0.1 04/14/2023 1541   BASOSABS 0.1 11/09/2017 0849     CMP     Component Value Date/Time   NA 137 04/16/2023 2240   NA 141 04/14/2023 1541   NA 141 11/09/2017 0849   K 3.3 (L) 04/16/2023 2240   K 3.9 11/09/2017 0849   CL 101 04/16/2023 2240   CL 99 02/20/2013 0758   CO2 25 04/16/2023 2240   CO2 29 11/09/2017 0849   GLUCOSE 115  (H) 04/16/2023 2240   GLUCOSE 91 11/09/2017 0849   GLUCOSE 115 (H) 02/20/2013 0758   BUN 15 04/16/2023 2240   BUN 19 04/14/2023 1541   BUN 22.5 11/09/2017 0849   CREATININE 1.45 (H) 04/16/2023 2240   CREATININE 0.87 09/10/2020 1226   CREATININE 0.9 11/09/2017 0849   CALCIUM 9.6 04/16/2023 2240   CALCIUM 10.3 11/09/2017 0849   PROT 7.3 04/16/2023 2240   PROT 7.6 11/09/2017 0849   ALBUMIN 3.6 04/16/2023 2240   ALBUMIN 3.9 11/09/2017 0849   AST 23 04/16/2023 2240   AST 24 09/10/2020 1226   AST 19 11/09/2017 0849   ALT 20 04/16/2023 2240   ALT 22 09/10/2020 1226   ALT 19 11/09/2017 0849   ALKPHOS 53 04/16/2023 2240  ALKPHOS 64 11/09/2017 0849   BILITOT 0.8 04/16/2023 2240   BILITOT 0.7 09/10/2020 1226   BILITOT 0.60 11/09/2017 0849   GFRNONAA 37 (L) 04/16/2023 2240   GFRNONAA >60 09/10/2020 1226   GFRAA >60 09/10/2020 1226    Imaging: VAS [place] LOWER EXTREMITY VENOUS (DVT) Result Date: 04/16/2023 Summary:  RIGHT:  - There is no evidence of deep vein thrombosis in the lower extremity.   - A cystic structure is found in the popliteal fossa. 4.9 x 3.3 cm.   LEFT:  - No evidence of common femoral vein obstruction.  Electronically signed by Gerarda Fraction on 04/16/2023 at 4:34:19 PM.    Final      ASSESSMENT & PLAN:   Assessment & Plan by Problem: Principal Problem:   GI bleed Active Problems:   Hypertension   Chronic pain   Cellulitis of right lower extremity   History of DVT (deep vein thrombosis)   Yvonne [name] is a 77 y.o. female with pertinent PMH of T2DM, HTN, HLD, breast cancer s/p mastectomy and chemotherapy completed in 2018, remote provoked DVT, and unprovoked DVT in 2021 who presented with 1 episode of hematochezia and is admitted for suspected lower GI bleed.  Lower GI bleed 1 episode of hematochezia last night without recurrence and lack of blood on repeat rectal exam.  No known history of diverticulosis and no known prior hemorrhoidal bleeds.  Based on  the amount of blood described and lack of obvious hemorrhoids on rectal exam this is most likely a diverticular bleed that was exacerbated by Xarelto.  Per her last oncology note in 2021 she was prescribed Xarelto 10 mg daily but was restarted 2 days ago on 20 mg daily.  It appears that the bleeding has stopped.   - GI consultation for colonoscopy -- Hold anticoagulation -- Maintain 2 PIVs, call physician for any further bleeding  History of unprovoked DVT in 2021 History of remote provoked DVT Most recently had a right lower extremity DVT found in 2021.  At which time she was placed on Eliquis but had hematuria and was switched to Xarelto.  Hypercoagulable workup at that time was positive for lupus anticoagulant but otherwise negative including cancer biomarkers CA 27.29 and CEA.  It appears that she was on Xarelto 5 mg daily however her last oncology note in 2021 indicates 10 mg daily.  She was off Xarelto for about 2 months recently after someone told her to stop taking it.  She was restarted on Xarelto after a clinic visit this week at 20 mg daily.  She also had a lower extremity venous Doppler study 2 days ago which showed no DVTs.  Since she has tolerated Xarelto at a lower dose in the past we will plan to resume Xarelto but at decreased dose and monitor for any recurrent bleeding. Would like to restart xarelto after colonoscopy to better assess risk of future bleeding.   AKI Creatinine of 1.45 with GFR of 37 on admission.  Baseline creatinine appears to be 0.9-1.1. She has taken 2 days of Bactrim for right lower extremity cellulitis which is likely the cause of her AKI.  As below we will switch her antibiotic and continue to monitor renal function.    Microscopic hematuria Asymptomatic Bacteruria  UA with large hemoglobin on dip and 21-50 RBCs/hpf along with trace leukocytes, rare bacteria, and 11-20 white blood cells on microscopy.  Calcium oxalate crystals also found on UA.  Differential for  microscopic hematuria close nephrolithiasis, renal or  bladder neoplasm, and interstitial nephritis after Bactrim use.  She does have a significant for remote smoking history putting her at higher risk for bladder cancer.  She has had hematuria in the past on blood thinners and is currently on a blood thinner.  No known cystoscopy in the past.  She will need urology follow-up for outpatient evaluation.  Cellulitis of RLE Seen in clinic and diagnosed with right lower extremity cellulitis on 04/14/2023.  Started on Bactrim for 7-day course.  Has shown interval improvement and no systemic signs of infection.  Due to AKI we will transition to cephalexin for treatment of nonpurulent cellulitis. - Cephalexin 500 mg twice daily for 2 more days, this will complete a 5-day course  Strong family history of cancer Patient has multiple family members that have had malignancies including 2 siblings with colon cancer.  Her last colonoscopy was in 2013 which did not show any abnormalities but we do not have the full report.  She does not report any melena, weight loss, or B symptoms and has no anemia.  She will need another outpatient colonoscopy but do not think she needs 1 during this hospitalization unless she is to start bleeding again.  Type 2 diabetes A1c was 5.9 on 03/30/2023.  She takes empagliflozin and metformin at home.  We will hold these in the setting of AKI as above and plan to resume on discharge.  Moderate SSI while admitted.  Hypertension Home medications include amlodipine 5 mg daily and losartan 25 mg daily.  With her AKI we will hold her losartan but continue her amlodipine. - Amlodipine 5 mg daily  Hx of Breast Cancer Breast cancer ( pT2 pN1, stage IIB invasive ductal carcinoma) diagnosed in 2013 and treated with right mastectomy and abbreviated chemotherapy with cyclophosphamide and docetaxel followed by 5 years of anastrozole completed in 2018.  She denied radiation therapy.  Left breast  lumpectomy in 2013 with benign findings.  Last oncology follow-up in 2021 to evaluate DVT.  At that time there was no evidence of breast cancer recurrence and CA 27.29 and CEA were within normal limits.  Diet: Carb-Modified VTE: None IVF: None Code: Full  Dispo: Admit patient to Observation with expected length of stay less than 2 midnights.  Signed: Rocky Morel, DO Internal Medicine Resident PGY-1  04/17/2023, 1:46 AM   Dr. Karoline Caldwell, MD Pager [phone]

## 2023-04-17 NOTE — Progress Notes (Signed)
Subjective:   Patient reports that she is feeling overall well today.  Denies having a bowel movement overnight.  Not having abdominal pain, dizziness, or blood in her urine.  Objective:  Vital signs in last 24 hours: Vitals:   04/16/23 2209 04/16/23 2245 04/17/23 0211 04/17/23 0830  BP:  127/64 103/89 117/68  Pulse:  62 66 (!) 56  Resp:  10 18 18   Temp:   98.2 F (36.8 C) 98.6 F (37 C)  TempSrc:   Oral Oral  SpO2:  98% 100% 97%  Weight: 85.3 kg  83.9 kg   Height: 5\' 5"  (1.651 m)  5\' 5"  (1.651 m)    Physical Exam: Constitutional: resting in bed, not in acute distress Cardio: Regular rate and rhythm. No murmurs, rubs, or gallops.  Pulm: normal work of breathing on RA, no wheezes or crackles Abdomen: Soft, non-tender, non-distended, normal bowel sounds. MSK: Trace lower extremity edema Skin: Warm and dry. Mild erythema of RLE anteriorly.  Neuro: AxO x 3.   Assessment/Plan:  Principal Problem:   GI bleed Active Problems:   Type 2 diabetes mellitus with proteinuria (HCC)   Hypertension   Chronic pain   Cellulitis of right lower extremity   History of DVT (deep vein thrombosis)  Yvonne [name] is a 77 y.o. female with pertinent PMH of T2DM, HTN, HLD, breast cancer s/p mastectomy and chemotherapy completed in 2018, remote provoked DVT, and unprovoked DVT in 2021 who presented with 1 episode of hematochezia and was admitted for suspected lower GI bleed.   # Lower GI bleed Taking Xarelto at home due to DVT.  Presented after 1 episode of hematochezia. Guaiac positive. Hemoglobin trending down, but stable at 11.2 today.  No bloody bowel movements overnight.  Remains HDS.  Most recent colonoscopy in 2013 was reported to be normal in prior notes but not on file. Suspect that the patient may need endoscopy to rule out GI bleed. Consulted GI this morning who will see the patient. - Appreciate GI recommendations - Hold anticoagulation   # History of unprovoked DVT in 2021 #  History of remote provoked DVT Had RLE DVT in 2021.  Hypercoagulable workup was positive for lupus anticoagulant. Was originally placed on Eliquis.  Switch to Xarelto following hematuria. - Holding anticoagulation for suspected GI bleed - SCDs   # AKI Baseline Cr ~ 0.9-1.1.  Creatinine 1.45 on admission.  Improved to 1.36 today.  Was recently taking Bactrim for RLE cellulitis.  Switched to Keflex while inpatient. - Avoid nephrotoxic medications - Trend BMP  # Microscopic hematuria # Asymptomatic Bacteruria  UA on admission with large hemoglobin, RBCs, trace leukocytes, rare bacteria, WBCs, and calcium oxalate crystals.  No urinary complaints at this time.  Will hold off on antibiotics for this reason.  Has had hematuria in the past.  Has smoking history and currently on a blood thinner.  Will repeat UA today to assess validity of initial UA. - Pending UA   # Cellulitis of RLE Diagnosed with RLE cellulitis on 4/24.  Started on 7-day course of Bactrim.  Switched from Bactrim to Keflex today for AKI. - Cephalexin 500 mg BID (day 1/2)   # Type 2 diabetes A1c 5.9% on 03/30/2023.  Taking empagliflozin and metformin at home. Holding empagliflozin for AKI. CBGs WNL this morning.  - SSI moderate   # Hypertension Taking amlodipine 5 mg and losartan 25 mg daily at home.  Holding losartan for AKI.  BP WNL. - Amlodipine 5 mg  daily   # Hx of Breast Cancer Originally diagnosed in 2013. S/p right mastectomy with chemotherapy (cyclophosphamide, docetaxel, anastrozole). Most recent oncology visit was in 2021 without evidence of recurrence.  Diet: Regular Bowel: none VTE: SCDs IVF: none Code: full PT/OT recs: none LOS: day 1  Prior to Admission Living Arrangement: at home w/ husband  Anticipated Discharge Location: TBD Barriers to Discharge: continued management Dispo: Anticipated discharge in approximately less than 2 day(s).   Yvonne Caldwell, MD 04/17/2023, 10:06 AM Pager: [phone] After 5pm  on weekdays and 1pm on weekends: On Call pager [phone]

## 2023-04-17 NOTE — Hospital Course (Addendum)
Yvonne [name] is a 77 y.o. female with pertinent PMH of T2DM, HTN, HLD, breast cancer s/p mastectomy and chemotherapy completed in 2018, remote provoked DVT, and unprovoked DVT in 2021 who presented with bleeding and was admitted for post-menopausal vaginal bleeding   #Post-menopausal vaginal bleeding Taking Xarelto at home due to DVT and she was recently started on the 20 mg dose, whereas previously, she was taking 5-10 mg. She presented after what was thought to be hematochezia (positive stool guiaic test), but actually found to be vaginal bleeding. Hb was stable at 11.1 and her bleeding slowed down (patient required about 3 pads in 24 hr period). A transvaginal ultrasound was obtained and showed a 6.1 cm central mass within the uterus, with tissue sampling recommended. Additionally, the patient's most recent colonoscopy was in 2013 and was reported to be normal in prior notes but not on file. She will need to follow up with OBGYN for tissue sampling of this mass and also with GI for colonoscopy in the outpatient setting.    # History of unprovoked DVT in 2021 # History of remote provoked DVT Had RLE DVT in 2021. Hypercoagulable workup was positive for lupus anticoagulant. Was originally placed on Eliquis and was later switched to xarelto. She was advised to restart taking her xalreto on 4/24, as there was concern for a posisble new DVT, however, she was later diagnosed with cellulitis.   # AKI Baseline Cr ~ 0.9-1.1.  Creatinine  elevated to 1.5, with GFR of 36.    #Microscopic hematuria #Asymptomatic Bacteruria  UA on admission with large hemoglobin, RBCs, trace leukocytes, rare bacteria, WBCs, and calcium oxalate crystals.  No urinary complaints at this time.  Will hold off on antibiotics for this reason.  Has had hematuria in the past.  Has smoking history and currently on a blood thinner.  Will repeat UA today to assess validity of initial UA. - Pending UA   #Cellulitis of RLE Diagnosed  with RLE cellulitis on 4/24.  Started on 7-day course of Bactrim.  Switched from Bactrim to Keflex, given AKI.     # Type 2 diabetes A1c 5.9% on 03/30/2023.  Taking empagliflozin and metformin at home. Holding empagliflozin for AKI. CBGs WNL this morning.  - SSI moderate   # Hypertension Taking amlodipine 5 mg and losartan 25 mg daily at home.  Holding losartan for AKI.  BP WNL. - Amlodipine 5 mg daily   # Hx of Breast Cancer Originally diagnosed in 2013. S/p right mastectomy with chemotherapy (cyclophosphamide, docetaxel, anastrozole). Most recent oncology visit was in 2021 without evidence of recurrence. ----------------------------   4/28 Patient using 3 pads yesterday for bleeding. Felt intermittently dizzy when walking to the bathroom.

## 2023-04-17 NOTE — Consult Note (Signed)
Consultation Note   Referring Provider:  Internal Medicine Teaching Service PCP: Lyndle Herrlich, MD Primary Gastroenterologist: Gentry Fitz        Reason for consultation: rectal bleeding and anemia  DOA: 04/16/2023        Hospital Day: 2  Brief Narrative:  Patient is a 77 y.o. year old female with a past medical history of  HTN, provoked DVT on Xarelto, right breast cancer s/p mastectomy and chemotherapy completed in 2018,  DM2  (see PMH for any additional medical problems)   Assessment and Plan   Hematochezia vrs episode of vaginal bleeding x 1 yesterday.  Discussed with primary team who will evaluate for any vaginal abnormalities ( ? Ultrasound). If negative then we will likely proceed with inpatient colonoscopy. Will give clears for now  [facility] of colon cancer in two siblings ( sounds like they were in their 60's or older at time of diagnosis)  Needs a colonoscopy at some point ( clinical course will help [place] decide if inpatient vr outpatient).   History of provoked DVTs, on Xarelto at home  [place] anemia.Hgb 11.2.  True baseline hgb hard to discern . It has fluctuated between mid 11 to 14 over last several months. Currently at 11.2. Will obtain iron studies    History of Present Illness Yvonne [name] presented to ED yesterday with vaginal bleeding. Yesterday evening she urinated and then saw a lot of blood in the toilet. She was not having a BM at that time. Her last BM was earlier in the day. This is the only time she has ever had any overt bleeding. She hasn't had any abdominal pain. No bowel changes. Her brother had colon cancer ( possibly in his 44's). Also her sister had colon cancer but she doesn't know at what age. Her last colonoscopy was many year ago. I am unable to find any record of the colonoscopy though she says it was an outpatient procedure done at the [facility].   Yvonne [name] has a history of provoked DVTs. She was apparently  off anticoagulation for several months but then was put on Xarelto a few days ago though it doesn't sound like she has had another recent DVT.    Imaging and Labs Recent Labs    04/14/23 1541 04/16/23 2240 04/17/23 0357  WBC 13.2* 10.6* 11.3*  HGB 14.3 12.2 11.2*  HCT 43.9 36.8 34.8*  PLT 282 258 247   Recent Labs    04/14/23 1541 04/16/23 2240 04/17/23 0357  NA 141 137 135  K 4.1 3.3* 2.9*  CL 99 101 102  CO2 19* 25 23  GLUCOSE 97 115* 104*  BUN 19 15 12   CREATININE 0.93 1.45* 1.36*  CALCIUM 10.5* 9.6 9.3   Recent Labs    04/16/23 2240  PROT 7.3  ALBUMIN 3.6  AST 23  ALT 20  ALKPHOS 53  BILITOT 0.8   No results for input(s): "HEPBSAG", "HCVAB", "HEPAIGM", "HEPBIGM" in the last 72 hours. Recent Labs    04/17/23 0357  LABPROT 28.0*  INR 2.6*    Previous GI Evaluation:  Very remote colonoscopy per patient but unclear who performed it    Principal Problem:   GI bleed Active Problems:   Type 2 diabetes mellitus  with proteinuria (HCC)   Hypertension   Chronic pain   Cellulitis of right lower extremity   History of DVT (deep vein thrombosis)   Past Medical History:  Diagnosis Date   Acute deep vein thrombosis (DVT) of popliteal vein of right lower extremity (HCC) 09/12/2020   AKI (acute kidney injury) (HCC) 02/09/2013   Arthritis    Breast cancer (HCC) 04/2012   right breast   Bruises easily    Cancer (HCC) 03/25/2012   right breast   Chronic back pain    arthritis   Elevated cholesterol    takes Crestor daily   Headache(784.0)    Hemorrhoids    History of chemotherapy    x 1, pt unable to tolerate, on PO chemotherpay   Hypertension    takes Maxzide daily    Phlebitis 4yrs ago   hx of-   Seasonal allergies    takes Zyrtec daily and Nasal Spray prn    Past Surgical History:  Procedure Laterality Date   APPENDECTOMY  48yrs ago   BREAST EXCISIONAL BIOPSY Left    BREAST SURGERY  04/27/12   Right mod mastectomy, ER/PR +, HER2 -   BREAST  SURGERY  04/27/12   Left Breast NL lumpectomy-no malignancy   COLONOSCOPY     MASTECTOMY Right 2013   PORT-A-CATH REMOVAL  11/24/2012   Procedure: REMOVAL PORT-A-CATH;  Surgeon: [name], DO;  Location: [facility];  Service: General;  Laterality: Left;   PORTACATH PLACEMENT  04/26/2012   Procedure: INSERTION PORT-A-CATH;  Surgeon: [name], DO;  Location: [facility] OR;  Service: General;  Laterality: Left;  Started at 1746.   TUBAL LIGATION  3yrs ago    Family History  Problem Relation Age of Onset   Cancer Mother        ovarian cancer   Leukemia Father    Cancer Father        leukemia   Cancer Sister 48       colon cancer   Cancer Brother 4       colon cancer   Cancer Brother        prostate cancer, deceased   Anesthesia problems Neg Hx    Hypotension Neg Hx    Malignant hyperthermia Neg Hx    Pseudochol deficiency Neg Hx     Prior to Admission medications   Medication Sig Start Date End Date Taking? Authorizing Provider  amLODipine (NORVASC) 5 MG tablet Take 1 tablet (5 mg total) by mouth daily. 04/14/23 04/13/24 Yes Merrilyn Puma, MD  empagliflozin (JARDIANCE) 10 MG TABS tablet Take 1 tablet (10 mg total) by mouth daily before breakfast. 04/14/23  Yes Merrilyn Puma, MD  gabapentin (NEURONTIN) 300 MG capsule Take 3 capsules (900 mg total) by mouth at bedtime. 03/30/23  Yes Rana Snare, DO  losartan (COZAAR) 25 MG tablet Take 1 tablet (25 mg total) by mouth daily. 04/14/23 04/13/24 Yes Merrilyn Puma, MD  metFORMIN (GLUCOPHAGE) 500 MG tablet Take 0.5 tablets (250 mg total) by mouth 2 (two) times daily with a meal. 01/01/23  Yes Lyndle Herrlich, MD  rivaroxaban (XARELTO) 20 MG TABS tablet Take 1 tablet (20 mg total) by mouth daily with supper. 04/15/23  Yes Merrilyn Puma, MD  rosuvastatin (CRESTOR) 40 MG tablet Take 1 tablet (40 mg total) by mouth daily. 01/01/23  Yes Lyndle Herrlich, MD  sulfamethoxazole-trimethoprim (BACTRIM DS) 800-160 MG tablet Take 1  tablet by mouth 2 (two) times daily.   Yes [provider]  Vitamin  D, Ergocalciferol, (DRISDOL) 1.25 MG (50000 UNIT) CAPS capsule Take 1 capsule (50,000 Units total) by mouth every 7 (seven) days. Sunday Patient taking differently: Take 50,000 Units by mouth every 7 (seven) days. Tuesday 01/29/23  Yes Mapp, Gaylyn Cheers, MD  diclofenac Sodium (VOLTAREN) 1 % GEL Apply 2 g topically 3 (three) times daily as needed (knee pain). Patient not taking: Reported on 04/17/2023 04/14/23   Merrilyn Puma, MD  glucose blood ([employer] GLUCOSE METER TEST STRIPS) test strip Use as instructed 01/29/23   Mapp, Gaylyn Cheers, MD  Lancets (FREESTYLE) lancets 1 each by Other route daily. 01/29/23   Mapp, Gaylyn Cheers, MD    Current Facility-Administered Medications  Medication Dose Route Frequency Provider Last Rate Last Admin   amLODipine (NORVASC) tablet 5 mg  5 mg Oral Daily Rocky Morel, DO       cephALEXin [facility]) capsule 500 mg  500 mg Oral Q12H Rocky Morel, DO   500 mg at 04/17/23 0231   diclofenac Sodium (VOLTAREN) 1 % topical gel 2 g  2 g Topical TID PRN Rocky Morel, DO       gabapentin (NEURONTIN) capsule 600 mg  600 mg Oral QHS Rocky Morel, DO       insulin aspart (novoLOG) injection 0-15 Units  0-15 Units Subcutaneous TID WC Rocky Morel, DO       potassium chloride SA (KLOR-CON M) CR tablet 40 mEq  40 mEq Oral BID Rocky Morel, DO       rosuvastatin (CRESTOR) tablet 40 mg  40 mg Oral Daily Rocky Morel, DO        Allergies as of 04/16/2023 - Review Complete 04/16/2023  Allergen Reaction Noted   Penicillins Anaphylaxis 04/05/2012    Social History   Socioeconomic History   Marital status: Married    Spouse name: Not on file   Number of children: Not on file   Years of education: Not on file   Highest education level: Not on file  Occupational History   Not on file  Tobacco Use   Smoking status: Former    Types: Cigarettes    Quit date: 01/10/1984    Years since quitting: 39.2    Smokeless tobacco: Never   Tobacco comments:    quit 30+yrs ago  Substance and Sexual Activity   Alcohol use: No   Drug use: No   Sexual activity: Yes    Birth control/protection: Post-menopausal    Comment: menarche age 63, P5,, 1st pregancy 61, menop 68, no HRT  Other Topics Concern   Not on file  Social History Narrative   Not on file   Social Determinants of Health   Financial Resource Strain: Medium Risk (04/15/2023)   Overall Financial Resource Strain (CARDIA)    Difficulty of Paying Living Expenses: Somewhat hard  Food Insecurity: No Food Insecurity (04/17/2023)   Hunger Vital Sign    Worried About Running Out of Food in the Last Year: Never true    Ran Out of Food in the Last Year: Never true  Transportation Needs: No Transportation Needs (04/17/2023)   PRAPARE - Administrator, Civil Service (Medical): No    Lack of Transportation (Non-Medical): No  Physical Activity: Inactive (04/15/2023)   Exercise Vital Sign    Days of Exercise per Week: 0 days    Minutes of Exercise per Session: 0 min  Stress: No Stress Concern Present (04/15/2023)   [employer] of Occupational Health - Occupational Stress Questionnaire    Feeling of Stress : Only  a little  Social Connections: Socially Integrated (04/15/2023)   Social Connection and Isolation Panel [NHANES]    Frequency of Communication with Friends and Family: More than three times a week    Frequency of Social Gatherings with Friends and Family: More than three times a week    Attends Religious Services: More than 4 times per year    Active Member of [employer] or Organizations: Yes    Attends Banker Meetings: Never    Marital Status: Married  Catering manager Violence: Not At Risk (04/17/2023)   Humiliation, Afraid, Rape, and Kick questionnaire    Fear of Current or Ex-Partner: No    Emotionally Abused: No    Physically Abused: No    Sexually Abused: No     Code Status   Code Status: Full  Code  Review of Systems: All systems reviewed and negative except where noted in HPI.  Physical Exam: Vital signs in last 24 hours: Temp:  [98.2 F (36.8 C)-99 F (37.2 C)] 98.6 F (37 C) (04/27 0830) Pulse Rate:  [56-79] 56 (04/27 0830) Resp:  [10-18] 18 (04/27 0830) BP: (103-127)/(61-89) 117/68 (04/27 0830) SpO2:  [96 %-100 %] 97 % (04/27 0830) Weight:  [83.9 kg-85.3 kg] 83.9 kg (04/27 0211) Last BM Date : 04/16/23  General:  Pleasant female in NAD Psych:  Cooperative. Normal mood and affect Eyes: Pupils equal Ears:  Normal auditory acuity Nose: No deformity, discharge or lesions Neck:  Supple, no masses felt Lungs:  Clear to auscultation.  Heart:  Regular rate, regular rhythm. Soft murmur Abdomen:  Soft, nondistended, nontender, active bowel sounds, no masses felt Rectal :  external hemorrhoids. No stool or blood in vault Vaginal: basic vaginal exam. Firmness felt anteriorly but could be pelvic bone. No blood found Msk: Symmetrical without gross deformities.  Neurologic:  Alert, oriented, grossly normal neurologically Extremities : No edema Skin:  Intact without significant lesions.    Intake/Output from previous day: No intake/output data recorded. Intake/Output this shift:  No intake/output data recorded.   Willette Cluster, NP-C @  04/17/2023, 10:05 AM

## 2023-04-17 NOTE — Progress Notes (Signed)
Re-evaluated the patient with Dr. Peterson Lombard at 1315. She continues to endorse bleeding when using the restroom, and she clarifies that the blood is either coming from her urine or vagina. She denies any blood in her stool. She has been wearing a pad and has had to change it three times thus far today.  Given the patient's history of post-menopausal bleeding, I performed a pelvic exam. The pelvic exam was notable for normal external genitalia, vulva, vagina, and there was bright red blood noted around the cervix. I suspect that this is the patient's source of blood loss, as opposed to a GI etiology.  Will obtain transvaginal ultrasound to evaluate for fibroids or thickened endometrium. A colonoscopy is still appropriate for this patient, however, will need to work up vaginal bleeding first. Continue to hold xarelto.   Elza Rafter, DO Internal Medicine Resident, PGY-2

## 2023-04-17 NOTE — ED Notes (Signed)
ED TO INPATIENT HANDOFF REPORT  ED Nurse Name and Phone #:  Pennie Rushing A. Trayon Krantz, RN [phone]  S Name/Age/Gender Yvonne [name] 77 y.o. female Room/Bed: 028C/028C  Code Status   Code Status: Full Code  Home/SNF/Other Home Patient oriented to: self, place, time, and situation Is this baseline? Yes   Triage Complete: Triage complete  Chief Complaint GI bleed [K92.2]  Triage Note The pt has had vaginal bleeding that started today  she was just placed on a blood thinner today   Allergies Allergies  Allergen Reactions   Penicillins Anaphylaxis    Has taken Keflex without issue (Dec 2023)    Level of Care/Admitting Diagnosis ED Disposition     ED Disposition  Admit   Condition  --   Comment  Hospital Area: [facility] [[id number]]  Level of Care: Med-Surg [16]  May place patient in observation at [facility] or [facility] if equivalent level of care is available:: Yes  Covid Evaluation: Asymptomatic - no recent exposure (last 10 days) testing not required  Diagnosis: GI bleed [956213]  Admitting Physician: Tyson Alias [[id number]]  Attending Physician: Tyson Alias [[id number]]          B Medical/Surgery History Past Medical History:  Diagnosis Date   Acute deep vein thrombosis (DVT) of popliteal vein of right lower extremity (HCC) 09/12/2020   AKI (acute kidney injury) (HCC) 02/09/2013   Arthritis    Breast cancer (HCC) 04/2012   right breast   Bruises easily    Cancer (HCC) 03/25/2012   right breast   Chronic back pain    arthritis   Elevated cholesterol    takes Crestor daily   Headache(784.0)    Hemorrhoids    History of chemotherapy    x 1, pt unable to tolerate, on PO chemotherpay   Hypertension    takes Maxzide daily    Phlebitis 86yrs ago   hx of-   Seasonal allergies    takes Zyrtec daily and Nasal Spray prn   Past Surgical History:  Procedure Laterality Date   APPENDECTOMY  55yrs ago   BREAST EXCISIONAL  BIOPSY Left    BREAST SURGERY  04/27/12   Right mod mastectomy, ER/PR +, HER2 -   BREAST SURGERY  04/27/12   Left Breast NL lumpectomy-no malignancy   COLONOSCOPY     MASTECTOMY Right 2013   PORT-A-CATH REMOVAL  11/24/2012   Procedure: REMOVAL PORT-A-CATH;  Surgeon: [name], DO;  Location: [facility];  Service: General;  Laterality: Left;   PORTACATH PLACEMENT  04/26/2012   Procedure: INSERTION PORT-A-CATH;  Surgeon: [name], DO;  Location: [facility] OR;  Service: General;  Laterality: Left;  Started at 1746.   TUBAL LIGATION  14yrs ago     A IV Location/Drains/Wounds Patient Lines/Drains/Airways Status     Active Line/Drains/Airways     Name Placement date Placement time Site Days   Implanted Port 04/26/12 Left Chest 04/26/12  1757  Chest  4008   Implanted Port 04/26/12 Left Chest 04/26/12  --  Chest  4008   Closed System Drain 2 Right;Inferior Breast Bulb (JP) 19 Fr. 04/26/12  1750  Breast  4008   Closed System Drain 1 Right Breast Bulb (JP) 04/26/12  --  Breast  4008            Intake/Output Last 24 hours No intake or output data in the 24 hours ending 04/17/23 0125  Labs/Imaging Results for orders placed or performed during  the hospital encounter of 04/16/23 (from the past 48 hour(s))  Urinalysis, Routine w reflex microscopic -Urine, Clean Catch     Status: Abnormal   Collection Time: 04/16/23  8:00 PM  Result Value Ref Range   Color, Urine YELLOW YELLOW   APPearance TURBID (A) CLEAR   Specific Gravity, Urine 1.014 1.005 - 1.030   pH 5.0 5.0 - 8.0   Glucose, UA >=500 (A) NEGATIVE mg/dL   Hgb urine dipstick LARGE (A) NEGATIVE   Bilirubin Urine NEGATIVE NEGATIVE   Ketones, ur NEGATIVE NEGATIVE mg/dL   Protein, ur NEGATIVE NEGATIVE mg/dL   Nitrite NEGATIVE NEGATIVE   Leukocytes,Ua TRACE (A) NEGATIVE   RBC / HPF 21-50 0 - 5 RBC/hpf   WBC, UA 11-20 0 - 5 WBC/hpf   Bacteria, UA RARE (A) NONE SEEN   Squamous Epithelial / HPF 0-5 0 - 5 /HPF   Mucus  PRESENT    Ca Oxalate Crys, UA PRESENT     Comment: Performed at [facility], [address] [place], [place] [address]  Type and screen [facility]     Status: None   Collection Time: 04/16/23 10:15 PM  Result Value Ref Range   ABO/RH(D) A POS    Antibody Screen NEG    Sample Expiration      04/19/2023,2359 Performed at [facility], [address] [place], [place] [address]   Comprehensive metabolic panel     Status: Abnormal   Collection Time: 04/16/23 10:40 PM  Result Value Ref Range   Sodium 137 135 - 145 mmol/L   Potassium 3.3 (L) 3.5 - 5.1 mmol/L   Chloride 101 98 - 111 mmol/L   CO2 25 22 - 32 mmol/L   Glucose, Bld 115 (H) 70 - 99 mg/dL    Comment: Glucose reference range applies only to samples taken after fasting for at least 8 hours.   BUN 15 8 - 23 mg/dL   Creatinine, Ser 9.14 (H) 0.44 - 1.00 mg/dL   Calcium 9.6 8.9 - 78.2 mg/dL   Total Protein 7.3 6.5 - 8.1 g/dL   Albumin 3.6 3.5 - 5.0 g/dL   AST 23 15 - 41 U/L   ALT 20 0 - 44 U/L   Alkaline Phosphatase 53 38 - 126 U/L   Total Bilirubin 0.8 0.3 - 1.2 mg/dL   GFR, Estimated 37 (L) >60 mL/min    Comment: (NOTE) Calculated using the CKD-EPI Creatinine Equation (2021)    Anion gap 11 5 - 15    Comment: Performed at [facility], [address], [place], [place] [address]  CBC     Status: Abnormal   Collection Time: 04/16/23 10:40 PM  Result Value Ref Range   WBC 10.6 (H) 4.0 - 10.5 K/uL   RBC 4.22 3.87 - 5.11 MIL/uL   Hemoglobin 12.2 12.0 - 15.0 g/dL   HCT 30.8 65.7 - 84.6 %   MCV 87.2 80.0 - 100.0 fL   MCH 28.9 26.0 - 34.0 pg   MCHC 33.2 30.0 - 36.0 g/dL   RDW 96.2 95.2 - 84.1 %   Platelets 258 150 - 400 K/uL   nRBC 0.0 0.0 - 0.2 %    Comment: Performed at [facility], [address], [place], [place] [address]  ABO/Rh     Status: None   Collection Time: 04/16/23 11:25 PM  Result Value Ref Range   ABO/RH(D)      A POS Performed  at [facility],  [address] [place], [place] [address]    VAS [place] LOWER EXTREMITY VENOUS (DVT)  Result Date: 04/16/2023  Lower Venous DVT Study Patient Name:  BRISTYN KULESZA  Date of Exam:   04/15/2023 Medical Rec #: [id number]           Accession #:    [id number] Date of Birth: 17-Jul-1946            Patient Gender: F Patient Age:   57 years Exam Location:  [facility] Procedure:      VAS [place] LOWER EXTREMITY VENOUS (DVT) Referring Phys: ERIK HOFFMAN --------------------------------------------------------------------------------  Indications: Right leg pain and erythema, most significant at knee.  Comparison Study: No prior studies. Performing Technologist: Jean Rosenthal RDMS, RVT  Examination Guidelines: A complete evaluation includes B-mode imaging, spectral Doppler, color Doppler, and power Doppler as needed of all accessible portions of each vessel. Bilateral testing is considered an integral part of a complete examination. Limited examinations for reoccurring indications may be performed as noted. The reflux portion of the exam is performed with the patient in reverse Trendelenburg.  +---------+---------------+---------+-----------+----------+--------------+ RIGHT    CompressibilityPhasicitySpontaneityPropertiesThrombus Aging +---------+---------------+---------+-----------+----------+--------------+ CFV      Full           Yes      Yes                                 +---------+---------------+---------+-----------+----------+--------------+ SFJ      Full                                                        +---------+---------------+---------+-----------+----------+--------------+ FV Prox  Full                                                        +---------+---------------+---------+-----------+----------+--------------+ FV Mid   Full                                                        +---------+---------------+---------+-----------+----------+--------------+ FV  DistalFull                                                        +---------+---------------+---------+-----------+----------+--------------+ PFV      Full                                                        +---------+---------------+---------+-----------+----------+--------------+ POP      Full           Yes      Yes                                 +---------+---------------+---------+-----------+----------+--------------+  PTV      Full                                                        +---------+---------------+---------+-----------+----------+--------------+ PERO     Full                                                        +---------+---------------+---------+-----------+----------+--------------+   +----+---------------+---------+-----------+----------+--------------+ LEFTCompressibilityPhasicitySpontaneityPropertiesThrombus Aging +----+---------------+---------+-----------+----------+--------------+ CFV Full           Yes      Yes                                 +----+---------------+---------+-----------+----------+--------------+     Summary: RIGHT: - There is no evidence of deep vein thrombosis in the lower extremity.  - A cystic structure is found in the popliteal fossa. 4.9 x 3.3 cm.  LEFT: - No evidence of common femoral vein obstruction.  *See table(s) above for measurements and observations. Electronically signed by Gerarda Fraction on 04/16/2023 at 4:34:19 PM.    Final     Pending Labs Unresulted Labs (From admission, onward)     Start     Ordered   04/17/23 0500  APTT  Tomorrow morning,   R        04/17/23 0011   04/17/23 0500  Protime-INR  Tomorrow morning,   R        04/17/23 0011   04/17/23 0500  Basic metabolic panel  Tomorrow morning,   R        04/17/23 0012   04/17/23 0500  CBC with Differential/Platelet  Tomorrow morning,   R        04/17/23 0012            Vitals/Pain Today's Vitals   04/16/23 2143 04/16/23 2209  04/16/23 2245  BP: 125/61  127/64  Pulse: 79  62  Resp: 18  10  Temp: 99 F (37.2 C)    SpO2: 96%  98%  Weight:  85.3 kg   Height:  5\' 5"  (1.651 m)   PainSc:  0-No pain     Isolation Precautions No active isolations  Medications Medications  amLODipine (NORVASC) tablet 5 mg (has no administration in time range)  rosuvastatin (CRESTOR) tablet 40 mg (has no administration in time range)  diclofenac Sodium (VOLTAREN) 1 % topical gel 2 g (has no administration in time range)  cephALEXin (KEFLEX) capsule 500 mg (has no administration in time range)  insulin aspart (novoLOG) injection 0-15 Units (has no administration in time range)    Mobility walks with person assist     Focused Assessments   R Recommendations: See Admitting Provider Note  Report given to:   Additional Notes:  Call or epic message for any additional details or questions

## 2023-04-18 ENCOUNTER — Inpatient Hospital Stay (HOSPITAL_COMMUNITY): Payer: Medicare Other

## 2023-04-18 DIAGNOSIS — N950 Postmenopausal bleeding: Secondary | ICD-10-CM

## 2023-04-18 DIAGNOSIS — I340 Nonrheumatic mitral (valve) insufficiency: Secondary | ICD-10-CM

## 2023-04-18 DIAGNOSIS — I361 Nonrheumatic tricuspid (valve) insufficiency: Secondary | ICD-10-CM

## 2023-04-18 DIAGNOSIS — D649 Anemia, unspecified: Secondary | ICD-10-CM

## 2023-04-18 LAB — GLUCOSE, CAPILLARY
Glucose-Capillary: 159 mg/dL — ABNORMAL HIGH (ref 70–99)
Glucose-Capillary: 97 mg/dL (ref 70–99)
Glucose-Capillary: 88 mg/dL (ref 70–99)
Glucose-Capillary: 98 mg/dL (ref 70–99)

## 2023-04-18 LAB — CBC
HCT: 34.4 % — ABNORMAL LOW (ref 36.0–46.0)
Hemoglobin: 11.1 g/dL — ABNORMAL LOW (ref 12.0–15.0)
MCH: 28.5 pg (ref 26.0–34.0)
MCHC: 32.3 g/dL (ref 30.0–36.0)
MCV: 88.2 fL (ref 80.0–100.0)
Platelets: 226 10*3/uL (ref 150–400)
RBC: 3.9 MIL/uL (ref 3.87–5.11)
RDW: 15 % (ref 11.5–15.5)
WBC: 11.6 10*3/uL — ABNORMAL HIGH (ref 4.0–10.5)
nRBC: 0 % (ref 0.0–0.2)

## 2023-04-18 LAB — ECHOCARDIOGRAM COMPLETE
AR max vel: 2.31 cm2
AV Area VTI: 2.29 cm2
AV Area mean vel: 2.29 cm2
AV Mean grad: 4 mmHg
AV Peak grad: 6.7 mmHg
Ao pk vel: 1.3 m/s
Area-P 1/2: 3.08 cm2
Calc EF: 59.1 %
Height: 65 in
MV VTI: 2.64 cm2
S' Lateral: 3.2 cm
Single Plane A2C EF: 63.7 %
Single Plane A4C EF: 55.5 %
Weight: 2959.46 oz

## 2023-04-18 LAB — BASIC METABOLIC PANEL
Anion gap: 8 (ref 5–15)
BUN: 16 mg/dL (ref 8–23)
CO2: 22 mmol/L (ref 22–32)
Calcium: 8.9 mg/dL (ref 8.9–10.3)
Chloride: 103 mmol/L (ref 98–111)
Creatinine, Ser: 1.5 mg/dL — ABNORMAL HIGH (ref 0.44–1.00)
GFR, Estimated: 36 mL/min — ABNORMAL LOW (ref >60)
Glucose, Bld: 121 mg/dL — ABNORMAL HIGH (ref 70–99)
Potassium: 3.8 mmol/L (ref 3.5–5.1)
Sodium: 133 mmol/L — ABNORMAL LOW (ref 135–145)

## 2023-04-18 NOTE — Plan of Care (Signed)

## 2023-04-18 NOTE — Evaluation (Signed)
Physical Therapy Evaluation and Discharge Patient Details Name: Yvonne [name] MRN: [id number] DOB: Oct 18, 1946 Today's Date: 04/18/2023  History of Present Illness  77 year old person living with a hypercoagulable state who recently restarted full dose anticoagulation with Xarelto and admitted 04/16/23 with 1 episode of bright red blood per rectum vs vaginal bleeding. PMH-T2DM, HTN, HLD, breast cancer s/p mastectomy, DVT in 2021 on anticoagulation  Clinical Impression   Patient evaluated by Physical Therapy with no further acute PT needs identified. All education has been completed and the patient has no further questions. Patient is modified independent with all mobility with use of her cane. PT is signing off. Thank you for this referral.        Recommendations for follow up therapy are one component of a multi-disciplinary discharge planning process, led by the attending physician.  Recommendations may be updated based on patient status, additional functional criteria and insurance authorization.  Follow Up Recommendations       Assistance Recommended at Discharge PRN  Patient can return home with the following  Help with stairs or ramp for entrance    Equipment Recommendations None recommended by PT  Recommendations for Other Services       Functional Status Assessment Patient has not had a recent decline in their functional status     Precautions / Restrictions Precautions Precautions: Fall Precaution Comments: denies falls at home Restrictions Weight Bearing Restrictions: No      Mobility  Bed Mobility Overal bed mobility: Modified Independent             General bed mobility comments: +heavy use of rail    Transfers Overall transfer level: Modified independent Equipment used: Straight cane                    Ambulation/Gait Ambulation/Gait assistance: Supervision, Modified independent (Device/Increase time) Gait Distance (Feet): 120  Feet Assistive device: Straight cane Gait Pattern/deviations: Step-through pattern, Antalgic   Gait velocity interpretation: 1.31 - 2.62 ft/sec, indicative of limited community ambulator   General Gait Details: pt prefers her cane taller than normally recommended; she "taps" it along without bearing any weight through RUE; explained it may decr her knee pain if we shortened cane and used it in her left hand and pt politely refused "I've tried that before and this works betterEngineer, maintenance (IT) Rankin (Stroke Patients Only)       Balance Overall balance assessment: Mild deficits observed, not formally tested                                           Pertinent Vitals/Pain Pain Assessment Pain Assessment: Faces Faces Pain Scale: Hurts little more Pain Location: rt knee Pain Descriptors / Indicators: Aching Pain Intervention(s): Limited activity within patient's tolerance, Monitored during session, Repositioned    Home Living Family/patient expects to be discharged to:: Private residence Living Arrangements: Spouse/significant other Available Help at Discharge: Family;Available 24 hours/day Type of Home: House Home Access: Stairs to enter Entrance Stairs-Rails: Right;Left;Can reach both Entrance Stairs-Number of Steps: 3   Home Layout: One level Home Equipment: Cane - single point      Prior Function Prior Level of Function : Needs assist             Mobility Comments: Reports she uses SPC  as needed for ambulating in home. ADLs Comments: independent with shower transfers and shower, dressing; Husband does all IADLs for patient including cooking/cleaning/driving.     Hand Dominance        Extremity/Trunk Assessment   Upper Extremity Assessment Upper Extremity Assessment: Generalized weakness    Lower Extremity Assessment Lower Extremity Assessment: Generalized weakness    Cervical / Trunk  Assessment Cervical / Trunk Assessment: Kyphotic  Communication   Communication: HOH  Cognition Arousal/Alertness: Awake/alert Behavior During Therapy: WFL for tasks assessed/performed Overall Cognitive Status: Within Functional Limits for tasks assessed                                 General Comments: a&ox4        General Comments      Exercises     Assessment/Plan    PT Assessment Patient does not need any further PT services  PT Problem List         PT Treatment Interventions      PT Goals (Current goals can be found in the Care Plan section)  Acute Rehab PT Goals PT Goal Formulation: All assessment and education complete, DC therapy    Frequency       Co-evaluation               AM-PAC PT "6 Clicks" Mobility  Outcome Measure Help needed turning from your back to your side while in a flat bed without using bedrails?: None Help needed moving from lying on your back to sitting on the side of a flat bed without using bedrails?: None Help needed moving to and from a bed to a chair (including a wheelchair)?: None Help needed standing up from a chair using your arms (e.g., wheelchair or bedside chair)?: None Help needed to walk in hospital room?: None Help needed climbing 3-5 steps with a railing? : A Little 6 Click Score: 23    End of Session Equipment Utilized During Treatment: Gait belt Activity Tolerance: Patient tolerated treatment well Patient left: in chair;with call bell/phone within reach;with chair alarm set Nurse Communication: Mobility status;Other (comment) (chair alarm cannot plug into wall) PT Visit Diagnosis: Difficulty in walking, not elsewhere classified (R26.2)    Time: 5409-8119 PT Time Calculation (min) (ACUTE ONLY): 14 min   Charges:   PT Evaluation $PT Eval Low Complexity: 1 Low           Jerolyn Center, PT Acute Rehabilitation Services  Office [phone]   Zena Amos 04/18/2023, 10:21 AM

## 2023-04-18 NOTE — Progress Notes (Signed)
PT Cancellation Note  Patient Details Name: Yvonne [name] MRN: [id number] DOB: [birth year]   Cancelled Treatment:    Reason Eval/Treat Not Completed: Patient at procedure or test/unavailable  Patient undergoing bedside procedure. Will return for PT evaluation for imminent discharge.    Jerolyn Center, PT Acute Rehabilitation Services  Office [phone]  Zena Amos 04/18/2023, 9:11 AM

## 2023-04-18 NOTE — Progress Notes (Signed)
HD#1 SUBJECTIVE:  Patient Summary: Yvonne [name] is a 77 y.o. female with pertinent PMH of T2DM, HTN, HLD, breast cancer s/p mastectomy and chemotherapy completed in 2018, remote provoked DVT, and unprovoked DVT in 2021 who presented with bleeding and admitted for evaluation of post-menopausal vaginal bleeding.   Overnight Events: No acute events overnight  Interim History: Yvonne [name] was evaluated at the bedside this morning and reports that her bleeding has slowed down. She does still have vaginal bleeding, but only required 3 pads throughout the day yesterday. She denies any hematochezia/melena. The patient does state she gets some dizziness with ambulating, but overall feels improved.   OBJECTIVE:  Vital Signs: Vitals:   04/17/23 0830 04/17/23 1539 04/17/23 1925 04/18/23 0526  BP: 117/68 125/65 (!) 142/66 (!) 127/56  Pulse: (!) 56 62 68 73  Resp: 18 18 18 16   Temp: 98.6 F (37 C) 98.5 F (36.9 C) 98.2 F (36.8 C) 98.5 F (36.9 C)  TempSrc: Oral Oral Oral Oral  SpO2: 97% 97% 98% 95%  Weight:      Height:       Supplemental O2: Room Air SpO2: 95 %  Filed Weights   04/16/23 2209 04/17/23 0211  Weight: 85.3 kg 83.9 kg    No intake or output data in the 24 hours ending 04/18/23 0534 Net IO Since Admission: No IO data has been entered for this period [04/18/23 0534]  Physical Exam: General: Pleasant, well-appearing elderly female laying in bed. No acute distress. CV: RRR. No murmurs.  Pulmonary: Lungs CTAB. Normal effort.  Abdominal: Soft, nontender, nondistended. Normal bowel sounds. Extremities: Trace LE edema Skin: Warm and dry.  Neuro: A&Ox3. No focal deficit. Psych: Normal mood and affect   ASSESSMENT/PLAN:  Assessment: Principal Problem:   GI bleed Active Problems:   Type 2 diabetes mellitus with proteinuria (HCC)   Hypertension   Chronic pain   Cellulitis of right lower extremity   History of DVT (deep vein  thrombosis)   Plan:  #Normocytic anemia #Post-menopausal vaginal bleeding #Uterine mass Bleeding started after patient started xarelto 20 mg daily (previously had only taken 5 or 10 mg at max of xarelto). She denies any hematochezia or melena, and only endorses vaginal bleeding. Pelvic exam performed yesterday which confirmed this. Transvaginal ultrasound ordered yesterday for further evaluation of this and revealed a 6.1 cm mass within the uterus - tissue sampling is recommended. Overall, her Hb remains stable this morning and she remains hemodynamically stable. Iron studies were within normal limits. Will need outpatient GYN follow up. Also will need to consider outpatient colonoscopy.  - Repeat CBC in AM - Continue to hold xarelto  - OBGYN follow up as outpatient - Work with PT/OT as able  #History of unprovoked DVT in 2021 # History of remote provoked DVT Had RLE DVT in 2021.  Hypercoagulable workup was positive for lupus anticoagulant and patient has been on low dose xarelto for this in the past. Recently restarted on xarelto 20 mg, but suspect this dose is too high for her. - Holding anticoagulation in the setting of bleeding, will continue to hold at discharge until she is evaluated by GN  #AKI  Kidney function within normal limits at baseline, but Cr elevated to 1.5 today, largely unchanged from admission.   Best Practice: Diet: Regular diet IVF: Fluids: none VTE: SCDs Start: 04/17/23 0109 Code: Full AB: None Therapy Recs: None Family Contact: Husband, to be notified. DISPO: Anticipated discharge tomorrow to Home pending  medical stability .  Signature: Elza Rafter, D.O.  Internal Medicine Resident, PGY-2 [facility]  Pager: [phone] 5:34 AM, 04/18/2023   Please contact the on call pager after 5 pm and on weekends at [phone].

## 2023-04-18 NOTE — Progress Notes (Signed)
Daily Progress Note  DOA: 04/16/2023 Hospital Day: 3 Chief Complaint:  vaginal bleeding   Assessment and Plan:    Brief Narrative:  Yvonne [name] is a 77 y.o. year old female with a past medical history not limited to HTN, provoked DVT on Xarelto, right breast cancer s/p mastectomy and chemotherapy completed in 2018,  DM2. Admitted with concern for hematochezia.   Hematochezia vrs vaginal bleeding, later favored. Pelvic / transvaginal US yesterday (after our consult) showed a 6 cm uterine mass with probable blood in cervix.   [facility] of colon cancer in two siblings ( sounds like they were in their 60's or older at time of diagnosis)  Needs an outpatient colonoscopy at some point. GI will sign off. I have sent a staff message to our office to contact patient with a hospital follow appt   History of provoked DVTs, on Xarelto at home   [place] anemia. Iron studies not c/w iron deficiency True baseline hgb hard to discern . It has fluctuated between mid 11 to 14 over last several months..  - Hgb stable at 11.1. Ferritn 68, TIBC 328, 14% iron sat   Subjective / New Events:  Has had some more vaginal bleeding ( small volume) since I saw her yesterday. Having normal BMs without blood   Objective:   Pelvic /transvaginal US 6.1 cm central mass within the uterus. It is difficult to determine whether it is endometrial or myometrial in origin. It is hypervascular and has echogenicity closer to the endometrium rather than myometrium. Possible blood products within the cervix.   Recent Labs    04/16/23 2240 04/17/23 0357 04/18/23 0325  WBC 10.6* 11.3* 11.6*  HGB 12.2 11.2* 11.1*  HCT 36.8 34.8* 34.4*  PLT 258 247 226   BMET Recent Labs    04/16/23 2240 04/17/23 0357 04/18/23 0325  NA 137 135 133*  K 3.3* 2.9* 3.8  CL 101 102 103  CO2 25 23 22   GLUCOSE 115* 104* 121*  BUN 15 12 16   CREATININE 1.45* 1.36* 1.50*  CALCIUM 9.6 9.3 8.9   LFT Recent Labs     04/16/23 2240  PROT 7.3  ALBUMIN 3.6  AST 23  ALT 20  ALKPHOS 53  BILITOT 0.8   PT/INR Recent Labs    04/17/23 0357  LABPROT 28.0*  INR 2.6*     Imaging:  US PELVIC COMPLETE WITH TRANSVAGINAL CLINICAL DATA:  Vaginal bleeding.  EXAM: TRANSABDOMINAL AND TRANSVAGINAL ULTRASOUND OF PELVIS  TECHNIQUE: Both transabdominal and transvaginal ultrasound examinations of the pelvis were performed. Transabdominal technique was performed for global imaging of the pelvis including uterus, ovaries, adnexal regions, and pelvic cul-de-sac. It was necessary to proceed with endovaginal exam following the transabdominal exam to visualize the endometrium.  COMPARISON:  None Available.  FINDINGS: Uterus  Measurements: 9.0 x 4.9 x 6 1 cm = volume: 140 mL. Large central mass within the uterus. Hyperdense material within the cervix may represent blood products or extension of the mass.  Endometrium  Obscured by large central mass filling the endometrial canal. The mass measures 6.1 x 4.6 x 4.7 cm. It is hyperechoic compared to the myometrium. It is vascular and its origin could not be confidently determined.  Right ovary  Not Seen  Left ovary  Not Seen  Other findings  No abnormal free fluid.  IMPRESSION: 6.1 cm central mass within the uterus. It is difficult to determine whether it is endometrial or myometrial in origin. It is hypervascular  and has echogenicity closer to the endometrium rather than myometrium.  Possible blood products within the cervix.  Tissue sampling is recommended.  Electronically Signed   By: Ted Mcalpine M.D.   On: 04/17/2023 15:42     Scheduled inpatient medications:   amLODipine  5 mg Oral Daily   cephALEXin  500 mg Oral Q12H   gabapentin  600 mg Oral QHS   insulin aspart  0-15 Units Subcutaneous TID WC   rosuvastatin  40 mg Oral Daily   Continuous inpatient infusions:  PRN inpatient medications: diclofenac Sodium  Vital  signs in last 24 hours: Temp:  [98.2 F (36.8 C)-98.5 F (36.9 C)] 98.3 F (36.8 C) (04/28 0844) Pulse Rate:  [62-73] 68 (04/28 0844) Resp:  [16-18] 18 (04/28 0844) BP: (124-142)/(56-66) 124/58 (04/28 0844) SpO2:  [93 %-98 %] 93 % (04/28 0844) Last BM Date : 04/17/23  Intake/Output Summary (Last 24 hours) at 04/18/2023 0907 Last data filed at 04/18/2023 0852 Gross per 24 hour  Intake 236 ml  Output --  Net 236 ml    Intake/Output from previous day: No intake/output data recorded. Intake/Output this shift: Total I/O In: 236 [P.O.:236] Out: -    Physical Exam:  General: Alert female in NAD. Hard of hearing Heart:  Regular rate and rhythm.  Pulmonary: Normal respiratory effort Abdomen: Soft, nondistended, nontender. Normal bowel sounds. Extremities: No lower extremity edema  Neurologic: Alert and oriented Psych: Pleasant. Cooperative. Insight appears normal.    Principal Problem:   GI bleed Active Problems:   Type 2 diabetes mellitus with proteinuria (HCC)   Hypertension   Chronic pain   Cellulitis of right lower extremity   History of DVT (deep vein thrombosis)     LOS: 1 day   Willette Cluster ,NP 04/18/2023, 9:07 AM

## 2023-04-19 DIAGNOSIS — D649 Anemia, unspecified: Secondary | ICD-10-CM | POA: Diagnosis not present

## 2023-04-19 DIAGNOSIS — N950 Postmenopausal bleeding: Secondary | ICD-10-CM | POA: Diagnosis not present

## 2023-04-19 LAB — GLUCOSE, CAPILLARY
Glucose-Capillary: 147 mg/dL — ABNORMAL HIGH (ref 70–99)
Glucose-Capillary: 100 mg/dL — ABNORMAL HIGH (ref 70–99)

## 2023-04-19 LAB — CBC
HCT: 36.3 % (ref 36.0–46.0)
Hemoglobin: 11.8 g/dL — ABNORMAL LOW (ref 12.0–15.0)
MCH: 28.2 pg (ref 26.0–34.0)
MCHC: 32.5 g/dL (ref 30.0–36.0)
MCV: 86.6 fL (ref 80.0–100.0)
Platelets: 251 10*3/uL (ref 150–400)
RBC: 4.19 MIL/uL (ref 3.87–5.11)
RDW: 15 % (ref 11.5–15.5)
WBC: 10.1 10*3/uL (ref 4.0–10.5)
nRBC: 0 % (ref 0.0–0.2)

## 2023-04-19 LAB — OCCULT BLOOD, POC DEVICE: Fecal Occult Bld: POSITIVE — AB

## 2023-04-19 MED ORDER — ONDANSETRON HCL 4 MG/2ML IJ SOLN: 4.0000 mg | Freq: Four times a day (QID) | INTRAMUSCULAR | Status: DC | PRN

## 2023-04-19 NOTE — Discharge Instructions (Addendum)
You were hospitalized for vaginal bleeding.  Hospital Course: On our imaging of your pelvis, we found a mass in your uterus that may be the cause of your vaginal bleeding. We have reffered you to follow up with OBGYN to have further testing of this uterine mass. Until you can be evaluated by OBGYN, we will continue holding your Xarelto to decrease your risk of bleeding. We are also holding your losartan and jardiance for now due to your decreased kidney function. Please follow up with [place] in clinic regarding your hospitalization. If you develop SOB, lightheadedness, leg pain/warmth/swelling, please call our clinic and visit the emergency department as these can be signs of a blood clot in your leg or in your lungs.   Medications:  Please stop taking: - Xarelto 20 mg daily - Losartan 25 mg daily - Jardiance 10 mg daily - Bactrim 800-160 mg twice daily  Please continue taking: - Amlodipine 5 mg daily - Voltaren Gel - Gabapentin 900 mg at bedtime - Metformin 250 mg twice daily - Rosuvastatin 40 mg daily - Vitamin D 50,000 units once weekly  Follow-up: - Please follow up with your primary care provider at the [facility] on 04/28/2023 at 9:15 AM  Phone: [phone] Address: Ground Floor - [facility], [address] [place], [place], [place] [address]  - Please follow up with OBGYN Dr. Alysia Penna on 04/21/2023 @ 10:15am Phone: [phone] Address: [address], [place], [place] [address]

## 2023-04-19 NOTE — Discharge Summary (Cosign Needed)
Name: Yvonne [name] MRN: [id number] DOB: 1946/02/05 77 y.o. PCP: Lyndle Herrlich, MD  Date of Admission: 04/16/2023  9:38 PM Date of Discharge: 04/19/2023 Attending Physician: No att. providers found  Discharge Diagnosis: 1. Principal Problem:   Abnormal uterine bleeding Active Problems:   Uterine Mass   Type 2 diabetes mellitus with proteinuria (HCC)   Hypertension   Chronic pain   Cellulitis of right lower extremity   History of DVT (deep vein thrombosis)   AKI  Discharge Medications: Allergies as of 04/19/2023       Reactions   Penicillins Anaphylaxis   Has taken Keflex without issue (Dec 2023)        Medication List     STOP taking these medications    empagliflozin 10 MG Tabs tablet Commonly known as: Jardiance   losartan 25 MG tablet Commonly known as: Cozaar   rivaroxaban 20 MG Tabs tablet Commonly known as: XARELTO   sulfamethoxazole-trimethoprim 800-160 MG tablet Commonly known as: BACTRIM DS       TAKE these medications    amLODipine 5 MG tablet Commonly known as: NORVASC Take 1 tablet (5 mg total) by mouth daily.   [employer] Glucose Meter Test Strips test strip Generic drug: glucose blood Use as instructed   diclofenac Sodium 1 % Gel Commonly known as: Voltaren Apply 2 g topically 3 (three) times daily as needed (knee pain).   freestyle lancets 1 each by Other route daily.   gabapentin 300 MG capsule Commonly known as: NEURONTIN Take 3 capsules (900 mg total) by mouth at bedtime.   metFORMIN 500 MG tablet Commonly known as: GLUCOPHAGE Take 0.5 tablets (250 mg total) by mouth 2 (two) times daily with a meal.   rosuvastatin 40 MG tablet Commonly known as: CRESTOR Take 1 tablet (40 mg total) by mouth daily.   Vitamin D (Ergocalciferol) 1.25 MG (50000 UNIT) Caps capsule Commonly known as: DRISDOL Take 1 capsule (50,000 Units total) by mouth every 7 (seven) days. Sunday What changed: additional instructions         Disposition and follow-up:   Ms.Rubie D Chmiel was discharged from [facility] in Good condition.  At the hospital follow up visit please address:  1.    A. Uterine mass, vaginal bleeding  - Uterine mass on pelvic US, holding home Xarelto, to f/u with OBGYN prior to restarting Xarelto   B. History of unprovoked DVT  - Holding Xarelto until patient can have uterine mass evaluated by OBGYN   C. AKI  - Holding losartan and empagliflozin, will need repeat BMP as outpatient before restarting   2.  Labs / imaging needed at time of follow-up: CBC, BMP  3.  Pending labs/ test needing follow-up: none  Follow-up Appointments:  Follow-up Information     Hermina Staggers, MD. Go on 04/21/2023.   Specialty: Obstetrics and Gynecology Why: 10:15 AM appointment Contact information: [address] Floor [place] [address] [phone]         Doran Stabler, DO. Go on 04/28/2023.   Specialty: Internal Medicine Why: 9:15 AM appointment Contact information: [address] [place] [address] [phone]               - Please follow up with your primary care provider at the [facility] on 04/28/2023 at 9:15 AM  Phone: [phone] Address: Ground Floor - [facility], [address] [place], [place], [place] [address]   - Please follow up  with OBGYN Dr. Alysia Penna on 04/21/2023 @ 10:15am Phone: [phone] Address: [address], [place], [place] [address]  Hospital Course by problem list:  Yvonne [name] is a 77 y.o. female with pertinent PMH of T2DM, HTN, HLD, breast cancer s/p mastectomy and chemotherapy completed in 2018, remote provoked DVT, and unprovoked DVT in 2021 who presented with vaginal vs rectal bleeding and was admitted for post-menopausal vaginal bleeding.    #Post-menopausal vaginal bleeding #Normocytic anemia #Uterine mass Presented w/ vaginal vs rectal bleeding while on Xarelto for DVT  prophylaxis. Had positive stool guaiac in ED, however, had visible blood on speculum exam. 6.1 cm uterine mass noted on TVUS. Hgb remained stable throughout her hospitalization. Iron studies WNL. Held Xarelto throughout her hospitalization and at discharge. Scheduled for outpatient OBGYN follow up. Will continue to hold Xarelto until she can be seen by OBGYN for uterine mass. Patient will also need outpatient colonoscopy. Scheduled outpatient f/u w/ [facility] clinic.    # History of unprovoked DVT in 2021 # History of remote provoked DVT Prior RLE DVT in 2021. Positive for lupus anticoagulant during prior hypercoagulable workup. Was originally prescribed Eliquis. Switched to Xarelto as outpatient due to hematuria. Had recent dose increase to 20 mg daily prior to admission.  Held Xarelto throughout her hospitalization and at discharge due to concern for vaginal bleeding.  Patient will need to be evaluated by OBGYN for her uterine mass prior to restarting Xarelto.  # AKI Baseline Cr ~ 0.9-1.1.  Creatinine was elevated but stable throughout her hospitalization. Suspected pre-renal in nature. Held home losartan and empagliflozin throughout her hospitalization and at discharge.  Patient will need repeat BMP as outpatient to reassess renal function and to assess if these medications can be restarted.   #Cellulitis of RLE Was diagnosed with RLE cellulitis on 4/24 and started on 7-day course of Bactrim. Antibiotic regimen was switched to Keflex while inpatient due to AKI. Patient completed appropriate course of antibiotics.   # Type 2 diabetes Was taking empagliflozin and metformin at home. Held home empagliflozin for AKI throughout her hospitalization and at discharge. Will need repeat BMP prior to restarting empagliflozin. Continued home metformin at discharge.  # Hypertension Was taking amlodipine 5 mg and losartan 25 mg daily at home prior to admission.  Held home losartan for AKI throughout her  hospitalization and at discharge. Continued amlodipine throughout her hospitalization and at discharge.  Will need repeat BMP and BP measurement as outpatient to assess if losartan can be restarted.    Discharge Exam:   BP 133/62 (BP Location: Left Arm)   Pulse 75   Temp 98.7 F (37.1 C) (Oral)   Resp 17   Ht 5\' 5"  (1.651 m)   Wt 83.9 kg   SpO2 93%   BMI 30.78 kg/m  General: Pleasant, well-appearing, not in acute distress CV: RRR. No murmurs.  Pulmonary: Normal respiratory effort, no wheezes or crackles.  Abdominal: Soft, nontender, nondistended. Normal bowel sounds. Extremities: Trace LE edema Skin: Warm and dry.  Neuro: A&Ox3.   Pertinent Labs, Studies, and Procedures:     Latest Ref Rng & Units 04/19/2023    3:16 AM 04/18/2023    3:25 AM 04/17/2023    3:57 AM  CBC  WBC 4.0 - 10.5 K/uL 10.1  11.6  11.3   Hemoglobin 12.0 - 15.0 g/dL 91.4  78.2  95.6   Hematocrit 36.0 - 46.0 % 36.3  34.4  34.8   Platelets 150 - 400 K/uL 251  226  247        Latest Ref Rng & Units 04/18/2023    3:25 AM 04/17/2023    3:57 AM 04/16/2023   10:40 PM  BMP  Glucose 70 - 99 mg/dL 161  096  045   BUN 8 - 23 mg/dL 16  12  15    Creatinine 0.44 - 1.00 mg/dL 4.09  8.11  9.14   Sodium 135 - 145 mmol/L 133  135  137   Potassium 3.5 - 5.1 mmol/L 3.8  2.9  3.3   Chloride 98 - 111 mmol/L 103  102  101   CO2 22 - 32 mmol/L 22  23  25    Calcium 8.9 - 10.3 mg/dL 8.9  9.3  9.6     US PELVIC COMPLETE WITH TRANSVAGINAL  IMPRESSION: 6.1 cm central mass within the uterus. It is difficult to determine whether it is endometrial or myometrial in origin. It is hypervascular and has echogenicity closer to the endometrium rather than myometrium.   Possible blood products within the cervix.   Tissue sampling is recommended.   On: 04/17/2023 15:42  ECHOCARDIOGRAM COMPLETE  IMPRESSIONS     1. Left ventricular ejection fraction, by estimation, is 55 to 60%. The  left ventricle has normal function. The left  ventricle has no regional  wall motion abnormalities. Left ventricular diastolic parameters were  normal.   2. Right ventricular systolic function is normal. The right ventricular  size is normal.   3. The mitral valve is abnormal. Mild mitral valve regurgitation. No  evidence of mitral stenosis.   4. The aortic valve is tricuspid. There is mild calcification of the  aortic valve. There is mild thickening of the aortic valve. Aortic valve  regurgitation is not visualized. Aortic valve sclerosis is present, with  no evidence of aortic valve stenosis.   5. The inferior vena cava is normal in size with greater than 50%  respiratory variability, suggesting right atrial pressure of 3 mmHg.  On: 04/19/2023  Discharge Instructions: Discharge Instructions     Ambulatory referral to Obstetrics / Gynecology   Complete by: As directed    Uterine mass - came in with post menopausal bleeding and needs biopsy   Call MD for:  difficulty breathing, headache or visual disturbances   Complete by: As directed    Call MD for:  extreme fatigue   Complete by: As directed    Call MD for:  persistant dizziness or light-headedness   Complete by: As directed    Call MD for:  severe uncontrolled pain   Complete by: As directed    Diet - low sodium heart healthy   Complete by: As directed    Increase activity slowly   Complete by: As directed       You were hospitalized for vaginal bleeding.   Hospital Course: On our imaging of your pelvis, we found a mass in your uterus that may be the cause of your vaginal bleeding. We have reffered you to follow up with OBGYN to have further testing of this uterine mass. Until you can be evaluated by OBGYN, we will continue holding your Xarelto to decrease your risk of bleeding. We are also holding your losartan and jardiance for now due to your decreased kidney function. Please follow up with [place] in clinic regarding your hospitalization. If you develop SOB,  lightheadedness, leg pain/warmth/swelling, please call our clinic and visit the emergency department as these can be signs of a blood clot in  your leg or in your lungs.    Medications:   Please stop taking: - Xarelto 20 mg daily - Losartan 25 mg daily - Jardiance 10 mg daily - Bactrim 800-160 mg twice daily   Please continue taking: - Amlodipine 5 mg daily - Voltaren Gel - Gabapentin 900 mg at bedtime - Metformin 250 mg twice daily - Rosuvastatin 40 mg daily - Vitamin D 50,000 units once weekly   Follow-up: - Please follow up with your primary care provider at the [facility] on 04/28/2023 at 9:15 AM  Phone: [phone] Address: Ground Floor - [facility], [address] [place], [place], [place] [address]   - Please follow up with OBGYN Dr. Alysia Penna on 04/21/2023 @ 10:15am Phone: [phone] Address: [address], [address], [place], [place] [address]   Signed: Karoline Caldwell, MD 04/19/2023, 1:45 PM   Pager: [phone]

## 2023-04-21 ENCOUNTER — Other Ambulatory Visit (HOSPITAL_COMMUNITY)
Admission: RE | Admit: 2023-04-21 | Discharge: 2023-04-21 | Disposition: A | Discharge status: Home / Self Care | Payer: Medicare Other | Source: Ambulatory Visit | Attending: Obstetrics and Gynecology | Admitting: Obstetrics and Gynecology

## 2023-04-21 ENCOUNTER — Ambulatory Visit: Payer: Medicare Other | Admitting: Obstetrics and Gynecology

## 2023-04-21 ENCOUNTER — Encounter: Payer: Self-pay | Admitting: Obstetrics and Gynecology

## 2023-04-21 VITALS — BP 135/79 | HR 75 | Ht 65.0 in | Wt 185.0 lb

## 2023-04-21 DIAGNOSIS — N950 Postmenopausal bleeding: Secondary | ICD-10-CM | POA: Diagnosis not present

## 2023-04-21 DIAGNOSIS — N9489 Other specified conditions associated with female genital organs and menstrual cycle: Secondary | ICD-10-CM | POA: Insufficient documentation

## 2023-04-21 DIAGNOSIS — N939 Abnormal uterine and vaginal bleeding, unspecified: Secondary | ICD-10-CM

## 2023-04-21 DIAGNOSIS — Z1339 Encounter for screening examination for other mental health and behavioral disorders: Secondary | ICD-10-CM | POA: Diagnosis not present

## 2023-04-21 NOTE — Progress Notes (Signed)
77 y.o. New GYN presents for AUB/Uterine Mass.

## 2023-04-21 NOTE — Progress Notes (Signed)
Yvonne [name] presents with her husband for evaluation of PMB and endometrial vs myometrial mass. Pt was recently hospitalized for GI and vaginal bleeding Pt reports LMP several yrs ago. Noted GI/vaginal bleeding this past Friday. Thought to be provoked by anticoagulation medication. Pt has H/O provoked and unprovoked VTE and needs indefinite anticoagulation. W/U in hospital via GYN U/S 6 cm central endometrial vs myometrial mass.  Pt still having some spotting. Anticoagulation is on hold for now.   H/O Breast Ca, s/p mastectomy and chemotherapy, currently in remission and no meds H/O HTN and DM2  H/O TSVD x 5 H/O BTL   PE  AF VSS Chaperone present  Lungs clear Heart RRR Abd soft + BS obese  ENDOMETRIAL BIOPSY     The indications for endometrial biopsy were reviewed.   Risks of the biopsy including cramping, bleeding, infection, uterine perforation, inadequate specimen and need for additional procedures  were discussed. The patient states she understands and agrees to undergo procedure today. Consent was signed. Time out was performed. Urine HCG was negative. During the pelvic exam, the cervix was prepped with Betadine. A single-toothed tenaculum was placed on the anterior lip of the cervix to stabilize it. The 3 mm pipelle was introduced into the endometrial cavity without difficulty to a depth of 7 cm, and a moderate amount of tissue was obtained and sent to pathology. The instruments were removed from the patient's vagina. Minimal bleeding from the cervix was noted. The patient tolerated the procedure well. Routine post-procedure instructions were given to the patient.      A/P AUB/PMB        Endometrial vs  myometrial mass  S/P EMBX. F/U per Bx results. May need MRI for more definite location and identification of the mass.

## 2023-04-23 ENCOUNTER — Telehealth: Payer: Self-pay

## 2023-04-23 ENCOUNTER — Other Ambulatory Visit: Payer: Self-pay

## 2023-04-23 LAB — SURGICAL PATHOLOGY

## 2023-04-23 NOTE — Telephone Encounter (Signed)
-----   Message from Meredith Pel, NP sent at 04/18/2023  9:42 AM EDT ----- Waynetta Sandy,  Will you contact this in a week or so ( in hospital now) with a hospital follow up with me. I know I am booked out until June. Thanks

## 2023-04-23 NOTE — Telephone Encounter (Signed)
New patient letter mailed to the patient. Appointment 07/20/23 at 8:30 am.

## 2023-04-25 ENCOUNTER — Emergency Department (HOSPITAL_COMMUNITY): Payer: Medicare Other

## 2023-04-25 ENCOUNTER — Emergency Department (HOSPITAL_COMMUNITY)
Admission: EM | Admit: 2023-04-25 | Discharge: 2023-04-26 | Disposition: A | Discharge status: Home / Self Care | Payer: Medicare Other | Attending: Emergency Medicine | Admitting: Emergency Medicine

## 2023-04-25 ENCOUNTER — Encounter (HOSPITAL_COMMUNITY): Payer: Self-pay

## 2023-04-25 ENCOUNTER — Telehealth: Payer: Self-pay | Admitting: Student

## 2023-04-25 ENCOUNTER — Other Ambulatory Visit: Payer: Self-pay

## 2023-04-25 DIAGNOSIS — Z79899 Other long term (current) drug therapy: Secondary | ICD-10-CM | POA: Insufficient documentation

## 2023-04-25 DIAGNOSIS — I10 Essential (primary) hypertension: Secondary | ICD-10-CM | POA: Insufficient documentation

## 2023-04-25 DIAGNOSIS — Z853 Personal history of malignant neoplasm of breast: Secondary | ICD-10-CM | POA: Diagnosis not present

## 2023-04-25 DIAGNOSIS — N939 Abnormal uterine and vaginal bleeding, unspecified: Secondary | ICD-10-CM | POA: Diagnosis not present

## 2023-04-25 DIAGNOSIS — Z7901 Long term (current) use of anticoagulants: Secondary | ICD-10-CM | POA: Diagnosis not present

## 2023-04-25 DIAGNOSIS — C541 Malignant neoplasm of endometrium: Secondary | ICD-10-CM | POA: Diagnosis not present

## 2023-04-25 LAB — COMPREHENSIVE METABOLIC PANEL
ALT: 18 U/L (ref 0–44)
AST: 24 U/L (ref 15–41)
Albumin: 3.4 g/dL — ABNORMAL LOW (ref 3.5–5.0)
Alkaline Phosphatase: 58 U/L (ref 38–126)
Anion gap: 13 (ref 5–15)
BUN: 14 mg/dL (ref 8–23)
CO2: 22 mmol/L (ref 22–32)
Calcium: 9.4 mg/dL (ref 8.9–10.3)
Chloride: 102 mmol/L (ref 98–111)
Creatinine, Ser: 1.19 mg/dL — ABNORMAL HIGH (ref 0.44–1.00)
GFR, Estimated: 47 mL/min — ABNORMAL LOW (ref >60)
Glucose, Bld: 97 mg/dL (ref 70–99)
Potassium: 3.5 mmol/L (ref 3.5–5.1)
Sodium: 137 mmol/L (ref 135–145)
Total Bilirubin: 0.3 mg/dL (ref 0.3–1.2)
Total Protein: 7 g/dL (ref 6.5–8.1)

## 2023-04-25 LAB — CBC WITH DIFFERENTIAL/PLATELET
Abs Immature Granulocytes: 0.03 10*3/uL (ref 0.00–0.07)
Basophils Absolute: 0.1 10*3/uL (ref 0.0–0.1)
Basophils Relative: 1 %
Eosinophils Absolute: 0.3 10*3/uL (ref 0.0–0.5)
Eosinophils Relative: 3 %
HCT: 35.6 % — ABNORMAL LOW (ref 36.0–46.0)
Hemoglobin: 11.6 g/dL — ABNORMAL LOW (ref 12.0–15.0)
Immature Granulocytes: 0 %
Lymphocytes Relative: 36 %
Lymphs Abs: 3.7 10*3/uL (ref 0.7–4.0)
MCH: 28.4 pg (ref 26.0–34.0)
MCHC: 32.6 g/dL (ref 30.0–36.0)
MCV: 87 fL (ref 80.0–100.0)
Monocytes Absolute: 0.7 10*3/uL (ref 0.1–1.0)
Monocytes Relative: 6 %
Neutro Abs: 5.6 10*3/uL (ref 1.7–7.7)
Neutrophils Relative %: 54 %
Platelets: 268 10*3/uL (ref 150–400)
RBC: 4.09 MIL/uL (ref 3.87–5.11)
RDW: 15.1 % (ref 11.5–15.5)
WBC: 10.3 10*3/uL (ref 4.0–10.5)
nRBC: 0 % (ref 0.0–0.2)

## 2023-04-25 LAB — LIPASE, BLOOD: Lipase: 27 U/L (ref 11–51)

## 2023-04-25 NOTE — ED Notes (Signed)
Pt unable to give UA at this time 

## 2023-04-25 NOTE — ED Provider Notes (Signed)
[place] EMERGENCY DEPARTMENT AT [facility] Provider Note   CSN: [id number] Arrival date & time: 04/25/23  1701     History  Chief Complaint  Patient presents with   Vaginal Bleeding    Yvonne [name] is a 77 y.o. female.  77 year old female presents today for concern of vaginal bleeding.  She was recently admitted into the hospital for GI bleed and vaginal bleeding.  She is on chronic anticoagulation due to prior history of DVTs.  She has been off of Xarelto since the recent admission until she undergoes workup with gynecology.  She was recently seen with gynecology outpatient and had an endometrial biopsy performed.  She states she has had bleeding since she was discharged however since the biopsy she has had 2 heavy episodes.  1 was a few nights ago where she states she soaked herself while she was laying in bed and some of the got to the bed as well.  She states he otherwise tonight when she noticed her pad was soaked prior to the shower and had some blood clots in it as well.  She endorses some vaginal pain from the recent biopsy otherwise denies any GYN complaints.  No abdominal pain, nausea, vomiting either.  Denies lightheadedness, chest pain, shortness of breath.  The history is provided by the patient. No language interpreter was used.       Home Medications Prior to Admission medications   Medication Sig Start Date End Date Taking? Authorizing Provider  amLODipine (NORVASC) 5 MG tablet Take 1 tablet (5 mg total) by mouth daily. 04/14/23 04/13/24  Merrilyn Puma, MD  gabapentin (NEURONTIN) 300 MG capsule Take 3 capsules (900 mg total) by mouth at bedtime. 03/30/23   Rana Snare, DO  glucose blood ([employer] GLUCOSE METER TEST STRIPS) test strip Use as instructed 01/29/23   Mapp, Gaylyn Cheers, MD  Lancets (FREESTYLE) lancets 1 each by Other route daily. 01/29/23   Mapp, Gaylyn Cheers, MD  metFORMIN (GLUCOPHAGE) 500 MG tablet Take 0.5 tablets (250 mg total) by mouth 2 (two) times daily  with a meal. 01/01/23   Lyndle Herrlich, MD  rosuvastatin (CRESTOR) 40 MG tablet Take 1 tablet (40 mg total) by mouth daily. 01/01/23   Lyndle Herrlich, MD      Allergies    Penicillins    Review of Systems   Review of Systems  Constitutional:  Negative for fever.  Respiratory:  Negative for shortness of breath.   Cardiovascular:  Negative for chest pain.  Gastrointestinal:  Negative for abdominal pain and nausea.  Genitourinary:  Positive for vaginal bleeding. Negative for dysuria.  Neurological:  Negative for light-headedness.  All other systems reviewed and are negative.   Physical Exam Updated Vital Signs BP (!) 120/107   Pulse 67   Temp 97.6 F (36.4 C) (Oral)   Resp 20   Wt 83.9 kg   SpO2 99%   BMI 30.79 kg/m  Physical Exam Vitals and nursing note reviewed.  Constitutional:      General: She is not in acute distress.    Appearance: Normal appearance. She is not ill-appearing.  HENT:     Head: Normocephalic and atraumatic.     Nose: Nose normal.  Eyes:     Conjunctiva/sclera: Conjunctivae normal.  Cardiovascular:     Rate and Rhythm: Normal rate and regular rhythm.  Pulmonary:     Effort: Pulmonary effort is normal. No respiratory distress.  Genitourinary:    Comments: Nurse present as chaperone.  No active bleeding  noted.  There was some blood within the vaginal vault.  No other acute or concerning findings. Musculoskeletal:        General: No deformity.  Skin:    Findings: No rash.  Neurological:     Mental Status: She is alert.     ED Results / Procedures / Treatments   Labs (all labs ordered are listed, but only abnormal results are displayed) Labs Reviewed  CBC WITH DIFFERENTIAL/PLATELET - Abnormal; Notable for the following components:      Result Value   Hemoglobin 11.6 (*)    HCT 35.6 (*)    All other components within normal limits  COMPREHENSIVE METABOLIC PANEL - Abnormal; Notable for the following components:   Creatinine,  Ser 1.19 (*)    Albumin 3.4 (*)    GFR, Estimated 47 (*)    All other components within normal limits  LIPASE, BLOOD  URINALYSIS, ROUTINE W REFLEX MICROSCOPIC    EKG None  Radiology No results found.  Procedures Procedures    Medications Ordered in ED Medications - No data to display  ED Course/ Medical Decision Making/ A&P                             Medical Decision Making  Medical Decision Making / ED Course   This patient presents to the ED for concern of vaginal bleeding, this involves an extensive number of treatment options, and is a complaint that carries with it a high risk of complications and morbidity.  The differential diagnosis includes endometrial cancer, bleeding from recent trauma from biopsy  MDM: 77 year old female presents with vaginal bleeding that has been ongoing since she was discharged from the hospital.  Xarelto was held currently until she completes a workup with GYN.  She has had multiple heavy bleeding episodes since her biopsy.  Most recently tonight.  Denies lightheadedness, chest pain, shortness of breath.  She is hemodynamically stable.  Pelvic exam without acute concerns.  Will order ultrasound and then discussed case with on-call GYN.  Labs reveal CBC without leukocytosis.  Mild anemia at 11.6 which is consistent with her baseline.  CMP shows creatinine 1.19 otherwise without acute findings.  Lipase within normal limits.  Pelvic ultrasound ordered.  Patient signed out to [facility] to follow-up on pelvic ultrasound, discussion with gynecology, and dispo.   Lab Tests: -I ordered, reviewed, and interpreted labs.   The pertinent results include:   Labs Reviewed  CBC WITH DIFFERENTIAL/PLATELET - Abnormal; Notable for the following components:      Result Value   Hemoglobin 11.6 (*)    HCT 35.6 (*)    All other components within normal limits  COMPREHENSIVE METABOLIC PANEL - Abnormal; Notable for the following components:   Creatinine, Ser  1.19 (*)    Albumin 3.4 (*)    GFR, Estimated 47 (*)    All other components within normal limits  LIPASE, BLOOD  URINALYSIS, ROUTINE W REFLEX MICROSCOPIC      EKG  EKG Interpretation  Date/Time:    Ventricular Rate:    PR Interval:    QRS Duration:   QT Interval:    QTC Calculation:   R Axis:     Text Interpretation:          Medicines ordered and prescription drug management: No orders of the defined types were placed in this encounter.   -I have reviewed the patients home medicines and have made adjustments as  needed   Reevaluation: After the interventions noted above, I reevaluated the patient and found that they have :stayed the same  Co morbidities that complicate the patient evaluation  Past Medical History:  Diagnosis Date   Acute deep vein thrombosis (DVT) of popliteal vein of right lower extremity (HCC) 09/12/2020   AKI (acute kidney injury) (HCC) 02/09/2013   Arthritis    Breast cancer (HCC) 04/2012   right breast   Bruises easily    Cancer (HCC) 03/25/2012   right breast   Chronic back pain    arthritis   Elevated cholesterol    takes Crestor daily   Headache(784.0)    Hemorrhoids    History of chemotherapy    x 1, pt unable to tolerate, on PO chemotherpay   Hypertension    takes Maxzide daily    Phlebitis 8yrs ago   hx of-   Seasonal allergies    takes Zyrtec daily and Nasal Spray prn      Dispostion: Patient signed out to [employer]  Final Clinical Impression(s) / ED Diagnoses Final diagnoses:  Vaginal bleeding    Rx / DC Orders ED Discharge Orders     None         Marita Kansas, PA-C 04/25/23 2343    Charlynne Pander, MD 04/28/23 2155

## 2023-04-25 NOTE — ED Provider Triage Note (Signed)
Emergency Medicine Provider Triage Evaluation Note  Yvonne [name] , a 77 y.o. female  was evaluated in triage.  Pt complains of heavy vaginal bleeding.  She had an endometrial biopsy 4 days ago for chronic vaginal bleeding.  Results of this have not been received.  Since that time, she has been bleeding heavier than normal and started passing blood clots this morning.  Reports using a couple of pads per day to help with the bleeding.  No history of significant anemia or need for blood transfusion.  No chest pain or shortness of breath.  Associated lower abdominal cramping.  No fever, chills, nausea, vomiting, or diarrhea.  Review of Systems  Positive: See HPI Negative: See HPI  Physical Exam  BP (!) 148/61 (BP Location: Left Arm)   Pulse 72   Temp 98.4 F (36.9 C) (Oral)   Resp 16   Wt 83.9 kg   SpO2 97%   BMI 30.79 kg/m  Gen:   Awake, no distress   Resp:  Normal effort lungs clear to auscultation MSK:   Moves extremities without difficulty no lower extremity edema Other:  Abdomen soft, nontender, nondistended, no rebound, guarding, or peritoneal signs; regular rate and rhythm, slightly pale conjunctive but skin appears normal for ethnicity, neurologically intact  Medical Decision Making  Medically screening exam initiated at 5:32 PM.  Appropriate orders placed.  [name] was informed that the remainder of the evaluation will be completed by another provider, this initial triage assessment does not replace that evaluation, and the importance of remaining in the ED until their evaluation is complete.     Tonette Lederer, PA-C 04/25/23 1733

## 2023-04-25 NOTE — ED Triage Notes (Signed)
Pt arrives with c/o vaginal bleeding that started after her biopsy last Wednesday. Per pt, the bleeding has gotten worse over the last 2 days. Pt endorse small clots. Pt denies dizziness, SOB, or CP.

## 2023-04-25 NOTE — Telephone Encounter (Signed)
Received page that Yvonne [name] would like to speak to provider. She reports she recently underwent an endometrial biopsy last Wednesday and has been bleeding since. Further mentions that today it has been acutely worse, with her pad being filled up with blood. She currently denies any dyspnea, lightheadedness, or syncope. Given she has continued bleeding that is worsening, I encouraged her to come to the Emergency Department this evening for further evaluation. She is agreement with the plan.  Evlyn Kanner, MD Internal Medicine PGY-3 Pager: [phone]

## 2023-04-26 ENCOUNTER — Other Ambulatory Visit: Payer: Self-pay | Admitting: Obstetrics & Gynecology

## 2023-04-26 ENCOUNTER — Telehealth: Payer: Self-pay | Admitting: Gynecologic Oncology

## 2023-04-26 DIAGNOSIS — C541 Malignant neoplasm of endometrium: Secondary | ICD-10-CM

## 2023-04-26 MED ORDER — MEDROXYPROGESTERONE ACETATE 10 MG PO TABS: 10.0000 mg | ORAL_TABLET | Freq: Every day | ORAL | 0 refills | Status: DC

## 2023-04-26 NOTE — Telephone Encounter (Signed)
Spoke with the patient regarding the referral to GYN oncology. Patient scheduled as new patient with Dr Pricilla Holm on 04/29/2023. Patient given an arrival time of 10:00am.  Explained to the patient the the doctor will perform a pelvic exam at this visit. Patient given the policy that only one visitor allowed and that visitor must be over 16 yrs are allowed in the Cancer Center. Patient given the address/phone number for the clinic and that the center offers free valet service. Patient aware that masks are option.

## 2023-04-26 NOTE — ED Provider Notes (Signed)
1:18 AM Patient care assumed from [place], [place]. In short, patient recently admitted for GIB and vaginal bleeding. During this hospitalization was found to have an echogenic mass in the region of the endometrium. Endometrial biopsy was performed on 04/21/23. 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 Patient presented tonight due to two heavier episodes of vaginal bleeding since this biopsy; 1 was a few nights ago and the other was this evening. She remains off her Xarelto  On patient's evaluation by prior provider there was no evidence of active hemorrhage on pelvic exam.  She has no tachycardia, hypotension.  Hemoglobin is stable compared to recent admission.  Ultrasound imaging tonight shows stable appearing mass without other complicating features.  Plan to discuss case with OB/GYN for recommendations regarding bleeding management.  1:25 AM Spoke with Dr. Charlotta Newton of OBGYN who will come assess patient in the ED and also relay recent biopsy results.  1:51 AM Patient evaluated in the ED by OBGYN, Dr. Charlotta Newton. Cleared for discharge.   Antony Madura, PA-C 04/26/23 0151    Dione Booze, MD 04/26/23 [phone]

## 2023-04-26 NOTE — Progress Notes (Signed)
Referral to gyn/onc See ER consult note  Myna Hidalgo, DO Attending Obstetrician & Gynecologist, [facility], [facility]

## 2023-04-26 NOTE — Discharge Instructions (Signed)
Continue follow-up with OB/GYN.  Return if bleeding worsens, especially should you require the use of 1 or more pads per hour for at least 3 to 5 hours or if you develop lightheadedness, loss of consciousness, shortness of breath with worsening bleeding.

## 2023-04-26 NOTE — Consult Note (Signed)
OBSTETRICS AND GYNECOLOGY ATTENDING CONSULT NOTE  Consult Date: 04/26/2023  Reason for Consult: Vaginal bleeding 2/2 endometrial carcinoma Consulting Provider: Dr. Charlotta Newton    Assessment/Plan:  Endometrial carcinoma -hemodynamically stable -discussed endometrial biopsy path report -referral made to gyn/onc -discussed likely management including surgical intervention -discussed oral progesterone as option to potentially help decrease bleeding until appt.  Pt does desire this medication- Rx sent in -questions/concerns were addressed -discharge home with outpt follow up  Myna Hidalgo, DO Attending Obstetrician & Gynecologist, [facility] for [facility], [facility]   History of Present Illness: Yvonne [name] is an 77 y.o. G48P0 female who presented to ED due to vaginal bleeding.  Initially started about a week ago then stopped for a little then returned yesterday.  Bleeding is bright red and typically using about 2 pads per day.  Denies pelvic or abdominal pain.  Reports no other acute complaints.  Pertinent OB/GYN History: No LMP recorded. Patient is postmenopausal. OB History  Gravida Para Term Preterm AB Living  5 0 0 0 0 5  SAB IAB Ectopic Multiple Live Births  0 0 0 0 0    # Outcome Date GA Lbr Len/2nd Weight Sex Delivery Anes PTL Lv  5 Gravida     F Vag-Spont     4 Gravida     M Vag-Spont     3 Gravida     M Vag-Spont     2 Gravida     F Vag-Spont     1 Gravida     F Vag-Spont      .gyn  Patient Active Problem List   Diagnosis Date Noted   Postmenopausal bleeding 04/21/2023   Endometrial mass 04/21/2023   Abnormal uterine bleeding 04/17/2023   Cellulitis of right lower extremity 04/17/2023   History of DVT (deep vein thrombosis) 04/17/2023   Right leg pain 04/14/2023   Hyperlipidemia 01/29/2023   Hypokalemia 01/29/2023   Chronic pain 01/03/2023   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) 12/02/2022    Postmenopausal estrogen deficiency 06/06/2014   Hot flashes related to aromatase inhibitor therapy 11/20/2013   Osteopenia 09/13/2012   Seroma, 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/20/2012   Type 2 diabetes mellitus with proteinuria (HCC) 04/10/2012   Hypertension 04/10/2012   Osteoarthritis 04/10/2012   S/P appendectomy 04/10/2012   Malignant neoplasm of upper-outer quadrant of right breast in female, estrogen receptor positive (HCC) 03/29/2012    Past Medical History:  Diagnosis Date   Acute deep vein thrombosis (DVT) of popliteal vein of right lower extremity (HCC) 09/12/2020   AKI (acute kidney injury) (HCC) 02/09/2013   Arthritis    Breast cancer (HCC) 04/2012   right breast   Bruises easily    Cancer (HCC) 03/25/2012   right breast   Chronic back pain    arthritis   Elevated cholesterol    takes Crestor daily   Headache(784.0)    Hemorrhoids    History of chemotherapy    x 1, pt unable to tolerate, on PO chemotherpay   Hypertension    takes Maxzide daily    Phlebitis 67yrs ago   hx of-   Seasonal allergies    takes Zyrtec daily and Nasal Spray prn    Past Surgical History:  Procedure Laterality Date   APPENDECTOMY  41yrs ago   BREAST EXCISIONAL BIOPSY Left    BREAST SURGERY  04/27/12   Right mod mastectomy, ER/PR +, HER2 -   BREAST SURGERY  04/27/12   Left Breast NL lumpectomy-no malignancy   COLONOSCOPY     MASTECTOMY Right 2013   PORT-A-CATH REMOVAL  11/24/2012   Procedure: REMOVAL PORT-A-CATH;  Surgeon: [name], DO;  Location: [facility];  Service: General;  Laterality: Left;   PORTACATH PLACEMENT  04/26/2012   Procedure: INSERTION PORT-A-CATH;  Surgeon: [name], DO;  Location: [facility] OR;  Service: General;  Laterality: Left;  Started at 1746.   TUBAL LIGATION  74yrs ago    Family History  Problem Relation Age of Onset   Cancer Mother        ovarian cancer   Leukemia Father    Cancer Father        leukemia   Cancer Sister 32       colon  cancer   Cancer Brother 68       colon cancer   Cancer Brother        prostate cancer, deceased   Anesthesia problems Neg Hx    Hypotension Neg Hx    Malignant hyperthermia Neg Hx    Pseudochol deficiency Neg Hx     Social History:  reports that she quit smoking about 39 years ago. Her smoking use included cigarettes. She has never used smokeless tobacco. She reports that she does not drink alcohol and does not use drugs.  Allergies:  Allergies  Allergen Reactions   Penicillins Anaphylaxis    Has taken Keflex without issue (Dec 2023)    Review of Systems: Pertinent items are noted in HPI.  Focused Physical Examination: BP 136/70   Pulse 61   Temp 97.6 F (36.4 C) (Oral)   Resp 14   Wt 83.9 kg   SpO2 99%   BMI 30.79 kg/m  CONSTITUTIONAL: Well-developed, well-nourished female in no acute distress.  HEENT: difficulty hearing NECK: Normal range of motion, supple, no masses.    SKIN: Skin is warm and dry. No rash noted. Not diaphoretic.  NEUROLGIC: Alert and oriented to person, place, and time.  PSYCHIATRIC: Normal mood and affect. Normal behavior. Normal judgment and thought content. CARDIOVASCULAR: Normal heart rate noted, regular rhythm RESPIRATORY: Clear to auscultation bilaterally. Effort and breath sounds normal, no problems with respiration noted. ABDOMEN: obese, Soft, normal bowel sounds, no distention noted.  No tenderness, rebound or guarding.  PELVIC: deferred MUSCULOSKELETAL: No edema, no calf tenderness bilaterally  Labs and Imaging: Results for orders placed or performed during the hospital encounter of 04/25/23 (from the past 72 hour(s))  CBC with Differential     Status: Abnormal   Collection Time: 04/25/23  5:42 PM  Result Value Ref Range   WBC 10.3 4.0 - 10.5 K/uL   RBC 4.09 3.87 - 5.11 MIL/uL   Hemoglobin 11.6 (L) 12.0 - 15.0 g/dL   HCT 16.1 (L) 09.6 - 04.5 %   MCV 87.0 80.0 - 100.0 fL   MCH 28.4 26.0 - 34.0 pg   MCHC 32.6 30.0 - 36.0 g/dL   RDW  40.9 81.1 - 91.4 %   Platelets 268 150 - 400 K/uL   nRBC 0.0 0.0 - 0.2 %   Neutrophils Relative % 54 %   Neutro Abs 5.6 1.7 - 7.7 K/uL   Lymphocytes Relative 36 %   Lymphs Abs 3.7 0.7 - 4.0 K/uL   Monocytes Relative 6 %   Monocytes Absolute 0.7 0.1 - 1.0 K/uL   Eosinophils Relative 3 %   Eosinophils Absolute 0.3 0.0 - 0.5 K/uL   Basophils Relative 1 %  Basophils Absolute 0.1 0.0 - 0.1 K/uL   Immature Granulocytes 0 %   Abs Immature Granulocytes 0.03 0.00 - 0.07 K/uL    Comment: Performed at [facility], [address], [place], [place] [address]  Comprehensive metabolic panel     Status: Abnormal   Collection Time: 04/25/23  5:42 PM  Result Value Ref Range   Sodium 137 135 - 145 mmol/L   Potassium 3.5 3.5 - 5.1 mmol/L   Chloride 102 98 - 111 mmol/L   CO2 22 22 - 32 mmol/L   Glucose, Bld 97 70 - 99 mg/dL    Comment: Glucose reference range applies only to samples taken after fasting for at least 8 hours.   BUN 14 8 - 23 mg/dL   Creatinine, Ser 2.59 (H) 0.44 - 1.00 mg/dL   Calcium 9.4 8.9 - 56.3 mg/dL   Total Protein 7.0 6.5 - 8.1 g/dL   Albumin 3.4 (L) 3.5 - 5.0 g/dL   AST 24 15 - 41 U/L   ALT 18 0 - 44 U/L   Alkaline Phosphatase 58 38 - 126 U/L   Total Bilirubin 0.3 0.3 - 1.2 mg/dL   GFR, Estimated 47 (L) >60 mL/min    Comment: (NOTE) Calculated using the CKD-EPI Creatinine Equation (2021)    Anion gap 13 5 - 15    Comment: Performed at [facility], [address], [place], [place] [address]  Lipase, blood     Status: None   Collection Time: 04/25/23  5:42 PM  Result Value Ref Range   Lipase 27 11 - 51 U/L    Comment: Performed at [facility], [address], [place], [place] [address]    US Pelvis Complete  Result Date: 04/26/2023 CLINICAL DATA:  Vaginal bleeding. Known endometrial mass. Recent biopsy. EXAM: TRANSABDOMINAL AND TRANSVAGINAL ULTRASOUND OF PELVIS TECHNIQUE: Both transabdominal and transvaginal ultrasound examinations of the pelvis  were performed. Transabdominal technique was performed for global imaging of the pelvis including uterus, ovaries, adnexal regions, and pelvic cul-de-sac. It was necessary to proceed with endovaginal exam following the transabdominal exam to visualize the endometrium and ovaries. COMPARISON:  Pelvic ultrasound dated 04/17/2023. FINDINGS: Uterus Measurements: 10.2 x 6.0 x 7.8 cm = volume: 248 mL. No fibroids or other mass visualized. Endometrium The endometrium is poorly visualized. There is a 5.4 x 4.5 x 6.2 cm predominantly echogenic mass with internal vascularity and areas of calcification in the endometrium corresponding to the known mass. Reportedly, this has been recently biopsied. Right ovary Not visualized. Left ovary Not visualized. Other findings No abnormal free fluid. IMPRESSION: Echogenic mass in the region of the endometrium may be endometrial or myometrial in origin. Findings may represent endometrial cancer, hyperplasia, or possibly an intracavitary fibroid. Correlation with biopsy findings recommended. Electronically Signed   By: Elgie Collard M.D.   On: 04/26/2023 00:34   US Transvaginal Non-OB  Result Date: 04/26/2023 CLINICAL DATA:  Vaginal bleeding. Known endometrial mass. Recent biopsy. EXAM: TRANSABDOMINAL AND TRANSVAGINAL ULTRASOUND OF PELVIS TECHNIQUE: Both transabdominal and transvaginal ultrasound examinations of the pelvis were performed. Transabdominal technique was performed for global imaging of the pelvis including uterus, ovaries, adnexal regions, and pelvic cul-de-sac. It was necessary to proceed with endovaginal exam following the transabdominal exam to visualize the endometrium and ovaries. COMPARISON:  Pelvic ultrasound dated 04/17/2023. FINDINGS: Uterus Measurements: 10.2 x 6.0 x 7.8 cm = volume: 248 mL. No fibroids or other mass visualized. Endometrium The endometrium is poorly visualized. There  is a 5.4 x 4.5 x 6.2 cm predominantly echogenic mass with internal vascularity  and areas of calcification in the endometrium corresponding to the known mass. Reportedly, this has been recently biopsied. Right ovary Not visualized. Left ovary Not visualized. Other findings No abnormal free fluid. IMPRESSION: Echogenic mass in the region of the endometrium may be endometrial or myometrial in origin. Findings may represent endometrial cancer, hyperplasia, or possibly an intracavitary fibroid. Correlation with biopsy findings recommended. Electronically Signed   By: Elgie Collard M.D.   On: 04/26/2023 00:34

## 2023-04-28 ENCOUNTER — Ambulatory Visit (INDEPENDENT_AMBULATORY_CARE_PROVIDER_SITE_OTHER): Payer: Medicare Other | Admitting: Student

## 2023-04-28 ENCOUNTER — Encounter: Payer: Self-pay | Admitting: Student

## 2023-04-28 ENCOUNTER — Encounter: Payer: Self-pay | Admitting: Gynecologic Oncology

## 2023-04-28 VITALS — BP 138/72 | HR 85 | Temp 98.8°F | Wt 187.9 lb

## 2023-04-28 DIAGNOSIS — N939 Abnormal uterine and vaginal bleeding, unspecified: Secondary | ICD-10-CM | POA: Diagnosis not present

## 2023-04-28 DIAGNOSIS — E1129 Type 2 diabetes mellitus with other diabetic kidney complication: Secondary | ICD-10-CM

## 2023-04-28 DIAGNOSIS — E876 Hypokalemia: Secondary | ICD-10-CM

## 2023-04-28 DIAGNOSIS — C541 Malignant neoplasm of endometrium: Secondary | ICD-10-CM

## 2023-04-28 DIAGNOSIS — Z7984 Long term (current) use of oral hypoglycemic drugs: Secondary | ICD-10-CM

## 2023-04-28 DIAGNOSIS — I825Y1 Chronic embolism and thrombosis of unspecified deep veins of right proximal lower extremity: Secondary | ICD-10-CM

## 2023-04-28 DIAGNOSIS — I10 Essential (primary) hypertension: Secondary | ICD-10-CM | POA: Diagnosis not present

## 2023-04-28 DIAGNOSIS — Z7901 Long term (current) use of anticoagulants: Secondary | ICD-10-CM

## 2023-04-28 DIAGNOSIS — E1169 Type 2 diabetes mellitus with other specified complication: Secondary | ICD-10-CM

## 2023-04-28 DIAGNOSIS — N179 Acute kidney failure, unspecified: Secondary | ICD-10-CM

## 2023-04-28 DIAGNOSIS — R809 Proteinuria, unspecified: Secondary | ICD-10-CM

## 2023-04-28 DIAGNOSIS — M1710 Unilateral primary osteoarthritis, unspecified knee: Secondary | ICD-10-CM

## 2023-04-28 DIAGNOSIS — I159 Secondary hypertension, unspecified: Secondary | ICD-10-CM

## 2023-04-28 DIAGNOSIS — Z1159 Encounter for screening for other viral diseases: Secondary | ICD-10-CM

## 2023-04-28 DIAGNOSIS — G8929 Other chronic pain: Secondary | ICD-10-CM

## 2023-04-28 LAB — CBC
HCT: 37.4 % (ref 36.0–46.0)
Hemoglobin: 12.5 g/dL (ref 12.0–15.0)
MCH: 28.5 pg (ref 26.0–34.0)
MCHC: 33.4 g/dL (ref 30.0–36.0)
MCV: 85.4 fL (ref 80.0–100.0)
Platelets: 268 10*3/uL (ref 150–400)
RBC: 4.38 MIL/uL (ref 3.87–5.11)
RDW: 15.1 % (ref 11.5–15.5)
WBC: 11.6 10*3/uL — ABNORMAL HIGH (ref 4.0–10.5)
nRBC: 0 % (ref 0.0–0.2)

## 2023-04-28 LAB — BASIC METABOLIC PANEL
Anion gap: 14 (ref 5–15)
BUN: 11 mg/dL (ref 8–23)
CO2: 23 mmol/L (ref 22–32)
Calcium: 9.8 mg/dL (ref 8.9–10.3)
Chloride: 101 mmol/L (ref 98–111)
Creatinine, Ser: 1.03 mg/dL — ABNORMAL HIGH (ref 0.44–1.00)
GFR, Estimated: 56 mL/min — ABNORMAL LOW (ref >60)
Glucose, Bld: 110 mg/dL — ABNORMAL HIGH (ref 70–99)
Potassium: 3.3 mmol/L — ABNORMAL LOW (ref 3.5–5.1)
Sodium: 138 mmol/L (ref 135–145)

## 2023-04-28 MED ORDER — GABAPENTIN 300 MG PO CAPS
900.0000 mg | ORAL_CAPSULE | Freq: Every day | ORAL | 2 refills | Status: DC
Start: 2023-04-28 — End: 2023-08-11

## 2023-04-28 MED ORDER — DICLOFENAC SODIUM 1 % EX GEL
2.0000 g | Freq: Three times a day (TID) | CUTANEOUS | 1 refills | Status: DC | PRN
Start: 2023-04-28 — End: 2023-08-31

## 2023-04-28 NOTE — Assessment & Plan Note (Signed)
A1c was 5.9 1 month ago.  She report adherence to metformin to 50 mg twice daily.  Her Jardiance was held due to AKI.  -Recheck BMP

## 2023-04-28 NOTE — Progress Notes (Signed)
Internal Medicine Clinic Attending  Case discussed with Dr. Nguyen  At the time of the visit.  We reviewed the resident's history and exam and pertinent patient test results.  I agree with the assessment, diagnosis, and plan of care documented in the resident's note. 

## 2023-04-28 NOTE — H&P (View-Only) (Signed)
GYNECOLOGIC ONCOLOGY NEW PATIENT CONSULTATION   Patient Name: Yvonne [name]  Patient Age: 77 y.o. 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  We reviewed the nature of endometrial cancer and its recommended surgical staging, including total hysterectomy, bilateral salpingo-oophorectomy, and lymph node assessment. The patient is a suitable candidate for staging via a minimally invasive approach to surgery.  We reviewed that robotic assistance would be used to complete the surgery.   We discussed that most endometrial cancer is detected early and that decisions regarding adjuvant therapy will be made based on her final pathology.   We reviewed the sentinel lymph node technique. Risks and benefits of sentinel lymph node biopsy was reviewed. We reviewed the technique and ICG dye. The patient DOES NOT have an iodine allergy or known liver dysfunction. We reviewed the false negative rate (0.4%), and that 3% of patients with metastatic disease will not have it detected by SLN biopsy in endometrial cancer. A low risk of allergic reaction to the dye, <0.2% for ICG, has been reported. We also discussed that in the case of failed mapping, which occurs 40% of the time, a bilateral or unilateral lymphadenectomy will be performed at the surgeon's discretion.   Potential benefits of sentinel nodes including a higher detection rate for metastasis due to ultrastaging and potential reduction in operative morbidity. However, there remains uncertainty as to the role for treatment of micrometastatic disease. Further, the benefit of operative morbidity associated with the SLN technique in endometrial cancer is not yet completely known. In other patient populations (e.g. the cervical cancer population)  there has been observed reductions in morbidity with SLN biopsy compared to pelvic lymphadenectomy. Lymphedema, nerve dysfunction and lymphocysts are all potential risks with the SLN technique as with complete lymphadenectomy. Additional risks to the patient include the risk of damage to an internal organ while operating in an altered view (e.g. the black and white image of the robotic fluorescence imaging mode).   We discussed the plan for a robotic assisted hysterectomy, bilateral salpingo-oophorectomy, sentinel lymph node evaluation, possible lymph node dissection, possible laparotomy. The risks of surgery were discussed in detail and she understands these to include infection; wound separation; hernia; vaginal cuff separation, injury to adjacent organs such as bowel, bladder, blood vessels, ureters and nerves; bleeding which may require blood transfusion; anesthesia risk; thromboembolic events; possible death; unforeseen complications; possible need for re-exploration; medical complications such as heart attack, stroke, pleural effusion and pneumonia; and, if full lymphadenectomy is performed the risk of lymphedema and lymphocyst. The patient will receive DVT and antibiotic prophylaxis as indicated. She voiced a clear understanding. She had the opportunity to ask questions. Perioperative instructions were reviewed with her. Prescriptions for post-op medications were sent to her pharmacy of choice.  Given her personal history of breast cancer, new uterine cancer diagnosis, and family history, recommendation made to have patient see genetics for discussion of genetic testing.    I do not see any documentation of colonoscopy in our system.  The patient states she has had a colonoscopy before but is unsure how long it has been.  I offered to place a referral for colonoscopy/GI.  In terms of her DVT history and lupus anticoagulant, we discussed prophylactic DOAC starting 1-2 days after surgery to continue  indefinitely.  I would recommend that she touch base with her  primary care provider once she has healed (1-2 weeks) about whether she will increase to higher dosing.  A copy of this note was sent to the patient's referring provider.   65 minutes of total time was spent for this patient encounter, including preparation, face-to-face counseling with the patient and coordination of care, and documentation of the encounter.  Eugene Garnet, MD  Division of Gynecologic Oncology  Department of Obstetrics and Gynecology  [facility]  ___________________________________________  Chief Complaint: Chief Complaint  Patient presents with   Endometrial carcinoma [facility])    History of Present Illness:  Yvonne [name] is a 77 y.o. y.o. female who is seen in consultation at the request of Dr. Charlotta Newton for an evaluation of endometrial cancer.  She presented to the ED with vaginal bleeding and hematochezia after recently starting Xarelto. She was seen for outpatient follow-up by OBGYN after pelvic ultrasound during her short hospitalization showed a uterus measuring 9 x 5 x 6 cm with a large central 6.2 cm endometrial vs myometrial mass. 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 The patient was started on 10 mg Provera at her follow-up visit on 5/6.   She has a history of breast cancer, provoked DVT (and possible a PE at the time of a pregnancy) and an unprovoked DVT in 2021. Work up revealed positive lupus anticoagulant.  She had initially been on Eliquis, was transition to Xarelto.  She was on prophylactic dosing until recently around the time of her vaginal bleeding.  Lower extremity Doppler in late April was negative for DVT on the right.  In terms of her breast cancer, this was stage IIb invasive ductal carcinoma, grade 2, ER/PR positive, HER2 negative.  She underwent modified radical mastectomy in 2013 followed by 1 cycle of adjuvant chemotherapy.  She  declined further chemotherapy as well as radiation.  She was treated with anastrozole for 5 years, completed in late 2018.  In terms of her type 2 diabetes, the patient takes metformin twice daily.  Blood glucose at home is typically between 100-110.  Her last hemoglobin A1c in early April was 5.9%.  She was recently treated for RLE cellulitis (was on Bactrim).   Patient presents today with her husband.  She notes having bleeding for over a month now.  This is the first postmenopausal bleeding that she has had.  On her heavier days, she was changing pads about 4 times a day, describes these as pantiliners.  Now, since starting Provera, bleeding has slowed somewhat.  She denies any dizziness or lightheadedness.  For the last several days, she has had some passage of blood clots, up to the size of golf balls.  She endorses a good appetite without nausea or emesis.  Endorses normal bowel and bladder function.  The patient walks with a cane secondary to arthritis in her knees.  PAST MEDICAL HISTORY:  Past Medical History:  Diagnosis Date   Acute deep vein thrombosis (DVT) of popliteal vein of right lower extremity (HCC) 09/12/2020   AKI (acute kidney injury) (HCC) 02/09/2013   Arthritis    Breast cancer (HCC) 04/2012   right breast   Bruises easily    Cancer (HCC) 03/25/2012   right breast   Chronic back pain    arthritis   Elevated cholesterol    takes Crestor daily   Headache(784.0)    Hemorrhoids    History of chemotherapy    x 1, pt unable to tolerate, on PO chemotherpay  Hypertension    takes Maxzide daily    Phlebitis 71yrs ago   hx of-   Seasonal allergies    takes Zyrtec daily and Nasal Spray prn     PAST SURGICAL HISTORY:  Past Surgical History:  Procedure Laterality Date   APPENDECTOMY  70yrs ago   BREAST EXCISIONAL BIOPSY Left    BREAST SURGERY  04/27/12   Right mod mastectomy, ER/PR +, HER2 -   BREAST SURGERY  04/27/12   Left Breast NL lumpectomy-no malignancy    COLONOSCOPY     MASTECTOMY Right 2013   PORT-A-CATH REMOVAL  11/24/2012   Procedure: REMOVAL PORT-A-CATH;  Surgeon: [name], DO;  Location: [facility];  Service: General;  Laterality: Left;   PORTACATH PLACEMENT  04/26/2012   Procedure: INSERTION PORT-A-CATH;  Surgeon: [name], DO;  Location: [facility] OR;  Service: General;  Laterality: Left;  Started at 1746.   TUBAL LIGATION  73yrs ago    OB/GYN HISTORY:  OB History  Gravida Para Term Preterm AB Living  5         5  SAB IAB Ectopic Multiple Live Births               # Outcome Date GA Lbr Len/2nd Weight Sex Delivery Anes PTL Lv  5 Gravida     F Vag-Spont     4 Gravida     M Vag-Spont     3 Gravida     M Vag-Spont     2 Gravida     F Vag-Spont     1 Gravida     F Vag-Spont       No LMP recorded. Patient is postmenopausal.  Age at menarche: 67 Age at menopause: 79 Hx of HRT: Denies Hx of STDs: Denies Last pap: Unsure History of abnormal pap smears: Denies  SCREENING STUDIES:  Last mammogram: 2023  Last colonoscopy: Thinks 5-6 years ago although is unsure  MEDICATIONS: Outpatient Encounter Medications as of 04/29/2023  Medication Sig   amLODipine (NORVASC) 5 MG tablet Take 1 tablet (5 mg total) by mouth daily.   diclofenac Sodium (VOLTAREN) 1 % GEL Apply 2 g topically every 8 (eight) hours as needed (apply to knee).   gabapentin (NEURONTIN) 300 MG capsule Take 3 capsules (900 mg total) by mouth at bedtime.   glucose blood ([employer] GLUCOSE METER TEST STRIPS) test strip Use as instructed   Lancets (FREESTYLE) lancets 1 each by Other route daily.   medroxyPROGESTERone (PROVERA) 10 MG tablet Take 1 tablet (10 mg total) by mouth daily.   metFORMIN (GLUCOPHAGE) 500 MG tablet Take 0.5 tablets (250 mg total) by mouth 2 (two) times daily with a meal.   rosuvastatin (CRESTOR) 40 MG tablet Take 1 tablet (40 mg total) by mouth daily.   senna-docusate (SENOKOT-S) 8.6-50 MG tablet Take 2 tablets by mouth at bedtime. For  AFTER surgery, do not take if having diarrhea   traMADol (ULTRAM) 50 MG tablet Take 1 tablet (50 mg total) by mouth every 6 (six) hours as needed for severe pain. For AFTER surgery only, do not take and drive   [DISCONTINUED] gabapentin (NEURONTIN) 300 MG capsule Take 3 capsules (900 mg total) by mouth at bedtime.   No facility-administered encounter medications on file as of 04/29/2023.    ALLERGIES:  Allergies  Allergen Reactions   Penicillins Anaphylaxis    Has taken Keflex without issue (Dec 2023)     FAMILY HISTORY:  Family History  Problem Relation Age  of Onset   Cancer Mother        ovarian cancer vs uterine   Leukemia Father    Cancer Father        leukemia   Cancer Sister 46       colon cancer   Colon cancer Sister    Cancer Brother 22       colon cancer   Colon cancer Brother    Cancer Brother        prostate cancer, deceased   Anesthesia problems Neg Hx    Hypotension Neg Hx    Malignant hyperthermia Neg Hx    Pseudochol deficiency Neg Hx    Breast cancer Neg Hx    Ovarian cancer Neg Hx    Endometrial cancer Neg Hx    Pancreatic cancer Neg Hx    Prostate cancer Neg Hx      SOCIAL HISTORY:  Social Connections: Socially Integrated (04/15/2023)   Social Connection and Isolation Panel [NHANES]    Frequency of Communication with Friends and Family: More than three times a week    Frequency of Social Gatherings with Friends and Family: More than three times a week    Attends Religious Services: More than 4 times per year    Active Member of [employer] or Organizations: Yes    Attends Banker Meetings: Never    Marital Status: Married    REVIEW OF SYSTEMS:  + vaginal bleeding, joint pain Denies appetite changes, fevers, chills, fatigue, unexplained weight changes. Denies hearing loss, neck lumps or masses, mouth sores, ringing in ears or voice changes. Denies cough or wheezing.  Denies shortness of breath. Denies chest pain or palpitations. Denies leg  swelling. Denies abdominal distention, pain, blood in stools, constipation, diarrhea, nausea, vomiting, or early satiety. Denies pain with intercourse, dysuria, frequency, hematuria or incontinence. Denies hot flashes, pelvic pain or vaginal discharge.   Denies back pain or muscle pain/cramps. Denies itching, rash, or wounds. Denies dizziness, headaches, numbness or seizures. Denies swollen lymph nodes or glands, denies easy bruising or bleeding. Denies anxiety, depression, confusion, or decreased concentration.  Physical Exam:  Vital Signs for this encounter:  Blood pressure (!) 146/72, pulse 74, temperature 99 F (37.2 C), temperature source Oral, resp. rate 18, height 5' 4.96" (1.65 m), weight 187 lb (84.8 kg), SpO2 99 %. Body mass index is 31.16 kg/m. General: Alert, oriented, no acute distress.  HEENT: Normocephalic, atraumatic. Sclera anicteric.  Chest: Clear to auscultation bilaterally. No wheezes, rhonchi, or rales. Cardiovascular: Regular rate and rhythm, no murmurs, rubs, or gallops.  Abdomen: Obese. Normoactive bowel sounds. Soft, nondistended, nontender to palpation. No masses or hepatosplenomegaly appreciated. No palpable fluid wave.  2-3 cm supraumbilical hernia. Extremities: Grossly normal range of motion. Warm, well perfused. No edema bilaterally.  Skin: No rashes or lesions.  Lymphatics: No cervical, supraclavicular, or inguinal adenopathy.  GU:  Normal external female genitalia.  No lesions. No discharge or bleeding.             Bladder/urethra:  No lesions or masses, well supported bladder             Vagina: Small amount of blood within the vaginal vault.  No lesions noted.             Cervix: Normal appearing, no lesions.             Uterus: 10 cm, mobile, no parametrial involvement or nodularity.  Adnexa: No masses appreciated.  Rectal: Deferred.  LABORATORY AND RADIOLOGIC DATA:  Outside medical records were reviewed to synthesize the above history,  along with the history and physical obtained during the visit.   Lab Results  Component Value Date   WBC 11.6 (H) 04/28/2023   HGB 12.5 04/28/2023   HCT 37.4 04/28/2023   PLT 268 04/28/2023   GLUCOSE 110 (H) 04/28/2023   CHOL 155 01/01/2023   TRIG 138 01/01/2023   HDL 64 01/01/2023   LDLCALC 67 01/01/2023   ALT 18 04/25/2023   AST 24 04/25/2023   NA 138 04/28/2023   K 3.3 (L) 04/28/2023   CL 101 04/28/2023   CREATININE 1.03 (H) 04/28/2023   BUN 11 04/28/2023   CO2 23 04/28/2023   TSH 2.800 03/30/2023   INR 2.6 (H) 04/17/2023   HGBA1C 5.9 (A) 03/30/2023

## 2023-04-28 NOTE — Assessment & Plan Note (Signed)
Her blood pressure is within normal limits at 138/72 today.  Her losartan was held due to AKI.  She is taking amlodipine 5 mg.  Patient was not symptomatic while rechecking orthostatic vitals.  -Continue morphine 5 mg for now. -Encourage p.o. intake -Continue holding losartan -Recheck BMP

## 2023-04-28 NOTE — Assessment & Plan Note (Signed)
Patient was admitted in April for abnormal uterine bleeding and found to have a 6 cm uterine mass.  The biopsy unfortunately came back as endometrial adenocarcinoma and negative for mismatch protein repair.  She did not require any blood transfusion in the hospital and her hemoglobin stable around 11.  Her iron study was within normal limits.  Xarelto was held due to her blood loss.  Patient returned to the ED on 5/5 for persistent vaginal bleeding.  Patient was hemodynamically stable with unchanged hemoglobin so she was discharged and advised to follow-up with Gyn Onc.  Today patient reports feeling better.  She still feels weak but denies lightheadedness or dizziness.  She sometimes feel lightheaded and if she stands up too fast.  Reports good p.o. intake.  She remains off of Xarelto.  We attempted orthostatic vital sign but could not complete due to patient's request.  She was not symptomatic during the maneuver.  -Obtain CBC today to monitor hemoglobin. -Follow-up with gynecology oncology tomorrow -Continue holding Xarelto in the setting of ongoing bleeding

## 2023-04-28 NOTE — Patient Instructions (Addendum)
Yvonne [name],  It was nice seeing you in the clinic today.  Here is a summary what we talked about:  1.  We will recheck your blood count to make sure you did not lose too much blood.  Please follow-up with your oncologist as scheduled  2.  Please continue holding the losartan and Jardiance.  I will call you for the kidney function result.  3.  I prescribed diclofenac cream and refill your gabapentin  Please follow-up in 2 months, sooner if needed  Dr. Cyndie Chime

## 2023-04-28 NOTE — Progress Notes (Signed)
CC: Hospital follow-up  HPI:  [name] is a 77 y.o. living with hypertension, type 2 diabetes, DVT, who was admitted to the hospital in April for abnormal uterine bleeding, found to have a 6 cm uterine mass.  Unfortunately biopsy came back as endometrial adenocarcinoma.  Please see problem based charting for detail  Past Medical History:  Diagnosis Date   Acute deep vein thrombosis (DVT) of popliteal vein of right lower extremity (HCC) 09/12/2020   AKI (acute kidney injury) (HCC) 02/09/2013   Arthritis    Breast cancer (HCC) 04/2012   right breast   Bruises easily    Cancer (HCC) 03/25/2012   right breast   Chronic back pain    arthritis   Elevated cholesterol    takes Crestor daily   Headache(784.0)    Hemorrhoids    History of chemotherapy    x 1, pt unable to tolerate, on PO chemotherpay   Hypertension    takes Maxzide daily    Phlebitis 15yrs ago   hx of-   Seasonal allergies    takes Zyrtec daily and Nasal Spray prn   Review of Systems:  per HPI  Physical Exam:  Vitals:   04/28/23 0910  BP: 138/72  Pulse: 85  Temp: 98.8 F (37.1 C)  TempSrc: Oral  SpO2: 99%  Weight: 187 lb 14.4 oz (85.2 kg)   Physical Exam Constitutional:      General: She is not in acute distress.    Appearance: She is not ill-appearing.  HENT:     Head: Normocephalic.     Mouth/Throat:     Mouth: Mucous membranes are moist.  Eyes:     General:        Right eye: No discharge.        Left eye: No discharge.     Conjunctiva/sclera: Conjunctivae normal.  Cardiovascular:     Rate and Rhythm: Normal rate and regular rhythm.  Pulmonary:     Effort: Pulmonary effort is normal. No respiratory distress.     Breath sounds: Normal breath sounds. No wheezing.  Musculoskeletal:     Cervical back: Normal range of motion.     Right lower leg: No edema.     Left lower leg: No edema.  Skin:    General: Skin is warm.  Neurological:     Mental Status: She is alert. Mental  status is at baseline.  Psychiatric:        Mood and Affect: Mood normal.      Assessment & Plan:   See Encounters Tab for problem based charting.  Endometrial adenocarcinoma [facility]) Patient was admitted in April for abnormal uterine bleeding and found to have a 6 cm uterine mass.  The biopsy unfortunately came back as endometrial adenocarcinoma and negative for mismatch protein repair.  She did not require any blood transfusion in the hospital and her hemoglobin stable around 11.  Her iron study was within normal limits.  Xarelto was held due to her blood loss.  Patient returned to the ED on 5/5 for persistent vaginal bleeding.  Patient was hemodynamically stable with unchanged hemoglobin so she was discharged and advised to follow-up with Gyn Onc.  Today patient reports feeling better.  She still feels weak but denies lightheadedness or dizziness.  She sometimes feel lightheaded and if she stands up too fast.  Reports good p.o. intake.  She remains off of Xarelto.  We attempted orthostatic vital sign but could not complete due to patient's request.  She was not symptomatic during the maneuver.  -Obtain CBC today to monitor hemoglobin. -Follow-up with gynecology oncology tomorrow -Continue holding Xarelto in the setting of ongoing bleeding  Hypertension Her blood pressure is within normal limits at 138/72 today.  Her losartan was held due to AKI.  She is taking amlodipine 5 mg.  Patient was not symptomatic while rechecking orthostatic vitals.  -Continue morphine 5 mg for now. -Encourage p.o. intake -Continue holding losartan -Recheck BMP  Type 2 diabetes mellitus with proteinuria (HCC) A1c was 5.9 1 month ago.  She report adherence to metformin to 50 mg twice daily.  Her Jardiance was held due to AKI.  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  Prior blood work showed that she has positive lupus anticoagulant.    -Continue holding Xarelto in the setting of ongoing bleeding.   Patient discussed with Dr. Heide Spark

## 2023-04-28 NOTE — Assessment & Plan Note (Signed)
Unclear etiology of her DVT.  Prior blood work showed that she has positive lupus anticoagulant.   -Continue holding Xarelto in the setting of ongoing bleeding.

## 2023-04-28 NOTE — Progress Notes (Unsigned)
GYNECOLOGIC ONCOLOGY NEW PATIENT CONSULTATION   Patient Name: Yvonne [name]  Patient Age: 77 y.o. 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  We reviewed the nature of endometrial cancer and its recommended surgical staging, including total hysterectomy, bilateral salpingo-oophorectomy, and lymph node assessment. The patient is a suitable candidate for staging via a minimally invasive approach to surgery.  We reviewed that robotic assistance would be used to complete the surgery.   We discussed that most endometrial cancer is detected early and that decisions regarding adjuvant therapy will be made based on her final pathology.   We reviewed the sentinel lymph node technique. Risks and benefits of sentinel lymph node biopsy was reviewed. We reviewed the technique and ICG dye. The patient DOES NOT have an iodine allergy or known liver dysfunction. We reviewed the false negative rate (0.4%), and that 3% of patients with metastatic disease will not have it detected by SLN biopsy in endometrial cancer. A low risk of allergic reaction to the dye, <0.2% for ICG, has been reported. We also discussed that in the case of failed mapping, which occurs 40% of the time, a bilateral or unilateral lymphadenectomy will be performed at the surgeon's discretion.   Potential benefits of sentinel nodes including a higher detection rate for metastasis due to ultrastaging and potential reduction in operative morbidity. However, there remains uncertainty as to the role for treatment of micrometastatic disease. Further, the benefit of operative morbidity associated with the SLN technique in endometrial cancer is not yet completely known. In other patient populations (e.g. the cervical cancer population)  there has been observed reductions in morbidity with SLN biopsy compared to pelvic lymphadenectomy. Lymphedema, nerve dysfunction and lymphocysts are all potential risks with the SLN technique as with complete lymphadenectomy. Additional risks to the patient include the risk of damage to an internal organ while operating in an altered view (e.g. the black and white image of the robotic fluorescence imaging mode).   We discussed the plan for a robotic assisted hysterectomy, bilateral salpingo-oophorectomy, sentinel lymph node evaluation, possible lymph node dissection, possible laparotomy. The risks of surgery were discussed in detail and she understands these to include infection; wound separation; hernia; vaginal cuff separation, injury to adjacent organs such as bowel, bladder, blood vessels, ureters and nerves; bleeding which may require blood transfusion; anesthesia risk; thromboembolic events; possible death; unforeseen complications; possible need for re-exploration; medical complications such as heart attack, stroke, pleural effusion and pneumonia; and, if full lymphadenectomy is performed the risk of lymphedema and lymphocyst. The patient will receive DVT and antibiotic prophylaxis as indicated. She voiced a clear understanding. She had the opportunity to ask questions. Perioperative instructions were reviewed with her. Prescriptions for post-op medications were sent to her pharmacy of choice.  Given her personal history of breast cancer, new uterine cancer diagnosis, and family history, recommendation made to have patient see genetics for discussion of genetic testing.    I do not see any documentation of colonoscopy in our system.  The patient states she has had a colonoscopy before but is unsure how long it has been.  I offered to place a referral for colonoscopy/GI.  In terms of her DVT history and lupus anticoagulant, we discussed prophylactic DOAC starting 1-2 days after surgery to continue  indefinitely.  I would recommend that she touch base with her  primary care provider once she has healed (1-2 weeks) about whether she will increase to higher dosing.  A copy of this note was sent to the patient's referring provider.   65 minutes of total time was spent for this patient encounter, including preparation, face-to-face counseling with the patient and coordination of care, and documentation of the encounter.  Eugene Garnet, MD  Division of Gynecologic Oncology  Department of Obstetrics and Gynecology  [facility]  ___________________________________________  Chief Complaint: Chief Complaint  Patient presents with   Endometrial carcinoma [facility])    History of Present Illness:  Yvonne [name] is a 77 y.o. y.o. female who is seen in consultation at the request of Dr. Charlotta Newton for an evaluation of endometrial cancer.  She presented to the ED with vaginal bleeding and hematochezia after recently starting Xarelto. She was seen for outpatient follow-up by OBGYN after pelvic ultrasound during her short hospitalization showed a uterus measuring 9 x 5 x 6 cm with a large central 6.2 cm endometrial vs myometrial mass. 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 The patient was started on 10 mg Provera at her follow-up visit on 5/6.   She has a history of breast cancer, provoked DVT (and possible a PE at the time of a pregnancy) and an unprovoked DVT in 2021. Work up revealed positive lupus anticoagulant.  She had initially been on Eliquis, was transition to Xarelto.  She was on prophylactic dosing until recently around the time of her vaginal bleeding.  Lower extremity Doppler in late April was negative for DVT on the right.  In terms of her breast cancer, this was stage IIb invasive ductal carcinoma, grade 2, ER/PR positive, HER2 negative.  She underwent modified radical mastectomy in 2013 followed by 1 cycle of adjuvant chemotherapy.  She  declined further chemotherapy as well as radiation.  She was treated with anastrozole for 5 years, completed in late 2018.  In terms of her type 2 diabetes, the patient takes metformin twice daily.  Blood glucose at home is typically between 100-110.  Her last hemoglobin A1c in early April was 5.9%.  She was recently treated for RLE cellulitis (was on Bactrim).   Patient presents today with her husband.  She notes having bleeding for over a month now.  This is the first postmenopausal bleeding that she has had.  On her heavier days, she was changing pads about 4 times a day, describes these as pantiliners.  Now, since starting Provera, bleeding has slowed somewhat.  She denies any dizziness or lightheadedness.  For the last several days, she has had some passage of blood clots, up to the size of golf balls.  She endorses a good appetite without nausea or emesis.  Endorses normal bowel and bladder function.  The patient walks with a cane secondary to arthritis in her knees.  PAST MEDICAL HISTORY:  Past Medical History:  Diagnosis Date   Acute deep vein thrombosis (DVT) of popliteal vein of right lower extremity (HCC) 09/12/2020   AKI (acute kidney injury) (HCC) 02/09/2013   Arthritis    Breast cancer (HCC) 04/2012   right breast   Bruises easily    Cancer (HCC) 03/25/2012   right breast   Chronic back pain    arthritis   Elevated cholesterol    takes Crestor daily   Headache(784.0)    Hemorrhoids    History of chemotherapy    x 1, pt unable to tolerate, on PO chemotherpay  Hypertension    takes Maxzide daily    Phlebitis 71yrs ago   hx of-   Seasonal allergies    takes Zyrtec daily and Nasal Spray prn     PAST SURGICAL HISTORY:  Past Surgical History:  Procedure Laterality Date   APPENDECTOMY  70yrs ago   BREAST EXCISIONAL BIOPSY Left    BREAST SURGERY  04/27/12   Right mod mastectomy, ER/PR +, HER2 -   BREAST SURGERY  04/27/12   Left Breast NL lumpectomy-no malignancy    COLONOSCOPY     MASTECTOMY Right 2013   PORT-A-CATH REMOVAL  11/24/2012   Procedure: REMOVAL PORT-A-CATH;  Surgeon: [name], DO;  Location: [facility];  Service: General;  Laterality: Left;   PORTACATH PLACEMENT  04/26/2012   Procedure: INSERTION PORT-A-CATH;  Surgeon: [name], DO;  Location: [facility] OR;  Service: General;  Laterality: Left;  Started at 1746.   TUBAL LIGATION  73yrs ago    OB/GYN HISTORY:  OB History  Gravida Para Term Preterm AB Living  5         5  SAB IAB Ectopic Multiple Live Births               # Outcome Date GA Lbr Len/2nd Weight Sex Delivery Anes PTL Lv  5 Gravida     F Vag-Spont     4 Gravida     M Vag-Spont     3 Gravida     M Vag-Spont     2 Gravida     F Vag-Spont     1 Gravida     F Vag-Spont       No LMP recorded. Patient is postmenopausal.  Age at menarche: 67 Age at menopause: 79 Hx of HRT: Denies Hx of STDs: Denies Last pap: Unsure History of abnormal pap smears: Denies  SCREENING STUDIES:  Last mammogram: 2023  Last colonoscopy: Thinks 5-6 years ago although is unsure  MEDICATIONS: Outpatient Encounter Medications as of 04/29/2023  Medication Sig   amLODipine (NORVASC) 5 MG tablet Take 1 tablet (5 mg total) by mouth daily.   diclofenac Sodium (VOLTAREN) 1 % GEL Apply 2 g topically every 8 (eight) hours as needed (apply to knee).   gabapentin (NEURONTIN) 300 MG capsule Take 3 capsules (900 mg total) by mouth at bedtime.   glucose blood ([employer] GLUCOSE METER TEST STRIPS) test strip Use as instructed   Lancets (FREESTYLE) lancets 1 each by Other route daily.   medroxyPROGESTERone (PROVERA) 10 MG tablet Take 1 tablet (10 mg total) by mouth daily.   metFORMIN (GLUCOPHAGE) 500 MG tablet Take 0.5 tablets (250 mg total) by mouth 2 (two) times daily with a meal.   rosuvastatin (CRESTOR) 40 MG tablet Take 1 tablet (40 mg total) by mouth daily.   senna-docusate (SENOKOT-S) 8.6-50 MG tablet Take 2 tablets by mouth at bedtime. For  AFTER surgery, do not take if having diarrhea   traMADol (ULTRAM) 50 MG tablet Take 1 tablet (50 mg total) by mouth every 6 (six) hours as needed for severe pain. For AFTER surgery only, do not take and drive   [DISCONTINUED] gabapentin (NEURONTIN) 300 MG capsule Take 3 capsules (900 mg total) by mouth at bedtime.   No facility-administered encounter medications on file as of 04/29/2023.    ALLERGIES:  Allergies  Allergen Reactions   Penicillins Anaphylaxis    Has taken Keflex without issue (Dec 2023)     FAMILY HISTORY:  Family History  Problem Relation Age  of Onset   Cancer Mother        ovarian cancer vs uterine   Leukemia Father    Cancer Father        leukemia   Cancer Sister 46       colon cancer   Colon cancer Sister    Cancer Brother 22       colon cancer   Colon cancer Brother    Cancer Brother        prostate cancer, deceased   Anesthesia problems Neg Hx    Hypotension Neg Hx    Malignant hyperthermia Neg Hx    Pseudochol deficiency Neg Hx    Breast cancer Neg Hx    Ovarian cancer Neg Hx    Endometrial cancer Neg Hx    Pancreatic cancer Neg Hx    Prostate cancer Neg Hx      SOCIAL HISTORY:  Social Connections: Socially Integrated (04/15/2023)   Social Connection and Isolation Panel [NHANES]    Frequency of Communication with Friends and Family: More than three times a week    Frequency of Social Gatherings with Friends and Family: More than three times a week    Attends Religious Services: More than 4 times per year    Active Member of [employer] or Organizations: Yes    Attends Banker Meetings: Never    Marital Status: Married    REVIEW OF SYSTEMS:  + vaginal bleeding, joint pain Denies appetite changes, fevers, chills, fatigue, unexplained weight changes. Denies hearing loss, neck lumps or masses, mouth sores, ringing in ears or voice changes. Denies cough or wheezing.  Denies shortness of breath. Denies chest pain or palpitations. Denies leg  swelling. Denies abdominal distention, pain, blood in stools, constipation, diarrhea, nausea, vomiting, or early satiety. Denies pain with intercourse, dysuria, frequency, hematuria or incontinence. Denies hot flashes, pelvic pain or vaginal discharge.   Denies back pain or muscle pain/cramps. Denies itching, rash, or wounds. Denies dizziness, headaches, numbness or seizures. Denies swollen lymph nodes or glands, denies easy bruising or bleeding. Denies anxiety, depression, confusion, or decreased concentration.  Physical Exam:  Vital Signs for this encounter:  Blood pressure (!) 146/72, pulse 74, temperature 99 F (37.2 C), temperature source Oral, resp. rate 18, height 5' 4.96" (1.65 m), weight 187 lb (84.8 kg), SpO2 99 %. Body mass index is 31.16 kg/m. General: Alert, oriented, no acute distress.  HEENT: Normocephalic, atraumatic. Sclera anicteric.  Chest: Clear to auscultation bilaterally. No wheezes, rhonchi, or rales. Cardiovascular: Regular rate and rhythm, no murmurs, rubs, or gallops.  Abdomen: Obese. Normoactive bowel sounds. Soft, nondistended, nontender to palpation. No masses or hepatosplenomegaly appreciated. No palpable fluid wave.  2-3 cm supraumbilical hernia. Extremities: Grossly normal range of motion. Warm, well perfused. No edema bilaterally.  Skin: No rashes or lesions.  Lymphatics: No cervical, supraclavicular, or inguinal adenopathy.  GU:  Normal external female genitalia.  No lesions. No discharge or bleeding.             Bladder/urethra:  No lesions or masses, well supported bladder             Vagina: Small amount of blood within the vaginal vault.  No lesions noted.             Cervix: Normal appearing, no lesions.             Uterus: 10 cm, mobile, no parametrial involvement or nodularity.  Adnexa: No masses appreciated.  Rectal: Deferred.  LABORATORY AND RADIOLOGIC DATA:  Outside medical records were reviewed to synthesize the above history,  along with the history and physical obtained during the visit.   Lab Results  Component Value Date   WBC 11.6 (H) 04/28/2023   HGB 12.5 04/28/2023   HCT 37.4 04/28/2023   PLT 268 04/28/2023   GLUCOSE 110 (H) 04/28/2023   CHOL 155 01/01/2023   TRIG 138 01/01/2023   HDL 64 01/01/2023   LDLCALC 67 01/01/2023   ALT 18 04/25/2023   AST 24 04/25/2023   NA 138 04/28/2023   K 3.3 (L) 04/28/2023   CL 101 04/28/2023   CREATININE 1.03 (H) 04/28/2023   BUN 11 04/28/2023   CO2 23 04/28/2023   TSH 2.800 03/30/2023   INR 2.6 (H) 04/17/2023   HGBA1C 5.9 (A) 03/30/2023

## 2023-04-29 ENCOUNTER — Inpatient Hospital Stay (HOSPITAL_BASED_OUTPATIENT_CLINIC_OR_DEPARTMENT_OTHER): Payer: Medicare Other | Admitting: Gynecologic Oncology

## 2023-04-29 ENCOUNTER — Encounter (HOSPITAL_COMMUNITY): Payer: Self-pay | Admitting: Gynecologic Oncology

## 2023-04-29 ENCOUNTER — Encounter: Payer: Self-pay | Admitting: Gynecologic Oncology

## 2023-04-29 ENCOUNTER — Other Ambulatory Visit: Payer: Self-pay

## 2023-04-29 ENCOUNTER — Telehealth: Payer: Self-pay | Admitting: Surgery

## 2023-04-29 ENCOUNTER — Inpatient Hospital Stay: Payer: Medicare Other | Attending: Gynecologic Oncology | Admitting: Gynecologic Oncology

## 2023-04-29 VITALS — BP 146/72 | HR 74 | Temp 99.0°F | Resp 18 | Ht 64.96 in | Wt 187.0 lb

## 2023-04-29 DIAGNOSIS — E7800 Pure hypercholesterolemia, unspecified: Secondary | ICD-10-CM | POA: Diagnosis not present

## 2023-04-29 DIAGNOSIS — Z806 Family history of leukemia: Secondary | ICD-10-CM | POA: Diagnosis not present

## 2023-04-29 DIAGNOSIS — Z8041 Family history of malignant neoplasm of ovary: Secondary | ICD-10-CM | POA: Insufficient documentation

## 2023-04-29 DIAGNOSIS — I10 Essential (primary) hypertension: Secondary | ICD-10-CM | POA: Insufficient documentation

## 2023-04-29 DIAGNOSIS — R760 Raised antibody titer: Secondary | ICD-10-CM

## 2023-04-29 DIAGNOSIS — Z7901 Long term (current) use of anticoagulants: Secondary | ICD-10-CM | POA: Diagnosis not present

## 2023-04-29 DIAGNOSIS — Z79890 Hormone replacement therapy (postmenopausal): Secondary | ICD-10-CM | POA: Diagnosis not present

## 2023-04-29 DIAGNOSIS — Z7984 Long term (current) use of oral hypoglycemic drugs: Secondary | ICD-10-CM | POA: Insufficient documentation

## 2023-04-29 DIAGNOSIS — Z86718 Personal history of other venous thrombosis and embolism: Secondary | ICD-10-CM | POA: Diagnosis not present

## 2023-04-29 DIAGNOSIS — C541 Malignant neoplasm of endometrium: Secondary | ICD-10-CM | POA: Insufficient documentation

## 2023-04-29 DIAGNOSIS — Z8042 Family history of malignant neoplasm of prostate: Secondary | ICD-10-CM | POA: Diagnosis not present

## 2023-04-29 DIAGNOSIS — M170 Bilateral primary osteoarthritis of knee: Secondary | ICD-10-CM | POA: Insufficient documentation

## 2023-04-29 DIAGNOSIS — Z800 Family history of malignant neoplasm of digestive organs: Secondary | ICD-10-CM | POA: Diagnosis not present

## 2023-04-29 DIAGNOSIS — E119 Type 2 diabetes mellitus without complications: Secondary | ICD-10-CM | POA: Insufficient documentation

## 2023-04-29 DIAGNOSIS — E669 Obesity, unspecified: Secondary | ICD-10-CM

## 2023-04-29 DIAGNOSIS — G8929 Other chronic pain: Secondary | ICD-10-CM | POA: Diagnosis not present

## 2023-04-29 DIAGNOSIS — Z853 Personal history of malignant neoplasm of breast: Secondary | ICD-10-CM | POA: Insufficient documentation

## 2023-04-29 DIAGNOSIS — Z79899 Other long term (current) drug therapy: Secondary | ICD-10-CM | POA: Insufficient documentation

## 2023-04-29 DIAGNOSIS — Z780 Asymptomatic menopausal state: Secondary | ICD-10-CM | POA: Insufficient documentation

## 2023-04-29 DIAGNOSIS — N950 Postmenopausal bleeding: Secondary | ICD-10-CM | POA: Insufficient documentation

## 2023-04-29 DIAGNOSIS — D6862 Lupus anticoagulant syndrome: Secondary | ICD-10-CM | POA: Diagnosis not present

## 2023-04-29 DIAGNOSIS — Z9011 Acquired absence of right breast and nipple: Secondary | ICD-10-CM | POA: Insufficient documentation

## 2023-04-29 MED ORDER — TRAMADOL HCL 50 MG PO TABS
50.0000 mg | ORAL_TABLET | Freq: Four times a day (QID) | ORAL | 0 refills | Status: DC | PRN
Start: 2023-04-29 — End: 2023-05-11

## 2023-04-29 MED ORDER — POTASSIUM CHLORIDE CRYS ER 20 MEQ PO TBCR
40.0000 meq | EXTENDED_RELEASE_TABLET | Freq: Two times a day (BID) | ORAL | 0 refills | Status: DC
Start: 2023-04-29 — End: 2023-05-06

## 2023-04-29 MED ORDER — SENNOSIDES-DOCUSATE SODIUM 8.6-50 MG PO TABS
2.0000 | ORAL_TABLET | Freq: Every day | ORAL | 0 refills | Status: DC
Start: 2023-04-29 — End: 2023-08-31

## 2023-04-29 NOTE — Addendum Note (Signed)
Addended byDoran Stabler on: 04/29/2023 02:07 PM   Modules accepted: Orders

## 2023-04-29 NOTE — Telephone Encounter (Signed)
-----   Message from Doylene Bode, NP sent at 04/29/2023 12:57 PM EDT ----- Please let the patient know her surgery is on May 15. She will receive a phone call from the hospital soon to arrange for a preop appt. Pt is HOH FYI

## 2023-04-29 NOTE — Progress Notes (Addendum)
Anesthesia Review:  PCP: Dagoberto Ligas LOV 12/2022 , Internal Medicine - Doran Stabler- LOV 04/28/23  Cardiologist : none  Chest x-ray : 11/30/22  EKG : 12/02/22  Echo : 04/18/23  VAscular- 04/16/23  Stress test: Cardiac Cath :  Activity level: can do a flight of stairs without difficuty  Sleep Study/ CPAP : none  Fasting Blood Sugar :      / Checks Blood Sugar -- times a day:   Blood Thinner/ Instructions /Last Dose: ASA / Instructions/ Last Dose :    DM- type 2- checks glucose daily  Hgba1c- 03/30/23- 5.9  CBC and BMP- 04/28/23  04/25/23- CBC/Diff and CMP  Called pt on 04/29/2023 and spoke with husband  PT currently at drugstore.  Will attempt to call later this pm . Called pt back on 04/29/23 at 1435pm.  MEd hx and instructions completed for surgery on 05/05/23.   PT aware to take shower with Dial soap nite before and am of surgery.  Clean sheets on bed nite before and clean pajamas nite before and clean clothes day of surgery .  PT voiced understanding.

## 2023-04-29 NOTE — Telephone Encounter (Signed)
Called patient to let her know of surgery date May 15th. Husband Fayrene Fearing answered the phone and stated he would let her know of surgery date, as she was unavailable at the time of the call. Also advised that patient will receive phone call from hospital soon to arrange for hospital pre-op. No other concerns at this time.

## 2023-04-29 NOTE — Patient Instructions (Addendum)
Preparing for your Surgery  Plan for surgery on May 05, 2023 (will call to confirm date as soon as we here from the OR) with Dr. Eugene Garnet at [facility]. You will be scheduled for robotic assisted total laparoscopic hysterectomy (removal of the uterus and cervix), bilateral salpingo-oophorectomy (removal of both ovaries and fallopian tubes), sentinel lymph node biopsy, possible lymph node dissection, possible laparotomy (larger incision on your abdomen if needed).  Pre-operative Testing -You will receive a phone call from presurgical testing at [facility] to arrange for a pre-operative appointment and lab work.  -Bring your insurance card, copy of an advanced directive if applicable, medication list  -At that visit, you will be asked to sign a consent for a possible blood transfusion in case a transfusion becomes necessary during surgery.  The need for a blood transfusion is rare but having consent is a necessary part of your care.     -You should not be taking blood thinners or aspirin at least ten days prior to surgery unless instructed by your surgeon.  -Do not take supplements such as fish oil (omega 3), red yeast rice, turmeric before your surgery. You want to avoid medications with aspirin in them including headache powders such as BC or Goody's), Excedrin migraine.  Day Before Surgery at Home -You will be asked to take in a light diet the day before surgery. You will be advised you can have clear liquids up until 3 hours before your surgery.    Eat a light diet the day before surgery.  Examples including soups, broths, toast, yogurt, mashed potatoes.  AVOID GAS PRODUCING FOODS AND BEVERAGES. Things to avoid include carbonated beverages (fizzy beverages, sodas), raw fruits and raw vegetables (uncooked), or beans.   If your bowels are filled with gas, your surgeon will have difficulty visualizing your pelvic organs which increases your surgical risks.  Your role  in recovery Your role is to become active as soon as directed by your doctor, while still giving yourself time to heal.  Rest when you feel tired. You will be asked to do the following in order to speed your recovery:  - Cough and breathe deeply. This helps to clear and expand your lungs and can prevent pneumonia after surgery.  - STAY ACTIVE WHEN YOU GET HOME. Do mild physical activity. Walking or moving your legs help your circulation and body functions return to normal. Do not try to get up or walk alone the first time after surgery.   -If you develop swelling on one leg or the other, pain in the back of your leg, redness/warmth in one of your legs, please call the office or go to the Emergency Room to have a doppler to rule out a blood clot. For shortness of breath, chest pain-seek care in the Emergency Room as soon as possible. - Actively manage your pain. Managing your pain lets you move in comfort. We will ask you to rate your pain on a scale of zero to 10. It is your responsibility to tell your doctor or nurse where and how much you hurt so your pain can be treated.  Special Considerations -If you are diabetic, you may be placed on insulin after surgery to have closer control over your blood sugars to promote healing and recovery.  This does not mean that you will be discharged on insulin.  If applicable, your oral antidiabetics will be resumed when you are tolerating a solid diet.  -Your final pathology results  from surgery should be available around one week after surgery and the results will be relayed to you when available.  -FMLA forms can be faxed to [phone] and please allow 5-7 business days for completion.  Pain Management After Surgery -You have been prescribed your pain medication and bowel regimen medications before surgery so that you can have these available when you are discharged from the hospital. The pain medication is for use ONLY AFTER surgery and a new prescription  will not be given.   -Make sure that you have Tylenol IF YOU ARE ABLE TO TAKE THESE MEDICATION at home to use on a regular basis after surgery for pain control.   -Review the attached handout on narcotic use and their risks and side effects.   Bowel Regimen -You have been prescribed Sennakot-S to take nightly to prevent constipation especially if you are taking the narcotic pain medication intermittently.  It is important to prevent constipation and drink adequate amounts of liquids. You can stop taking this medication when you are not taking pain medication and you are back on your normal bowel routine.  Risks of Surgery Risks of surgery are low but include bleeding, infection, damage to surrounding structures, re-operation, blood clots, and very rarely death.   Blood Transfusion Information (For the consent to be signed before surgery)  We will be checking your blood type before surgery so in case of emergencies, we will know what type of blood you would need.                                            WHAT IS A BLOOD TRANSFUSION?  A transfusion is the replacement of blood or some of its parts. Blood is made up of multiple cells which provide different functions. Red blood cells carry oxygen and are used for blood loss replacement. White blood cells fight against infection. Platelets control bleeding. Plasma helps clot blood. Other blood products are available for specialized needs, such as hemophilia or other clotting disorders. BEFORE THE TRANSFUSION  Who gives blood for transfusions?  You may be able to donate blood to be used at a later date on yourself (autologous donation). Relatives can be asked to donate blood. This is generally not any safer than if you have received blood from a stranger. The same precautions are taken to ensure safety when a relative's blood is donated. Healthy volunteers who are fully evaluated to make sure their blood is safe. This is blood bank  blood. Transfusion therapy is the safest it has ever been in the practice of medicine. Before blood is taken from a donor, a complete history is taken to make sure that person has no history of diseases nor engages in risky social behavior (examples are intravenous drug use or sexual activity with multiple partners). The donor's travel history is screened to minimize risk of transmitting infections, such as malaria. The donated blood is tested for signs of infectious diseases, such as HIV and hepatitis. The blood is then tested to be sure it is compatible with you in order to minimize the chance of a transfusion reaction. If you or a relative donates blood, this is often done in anticipation of surgery and is not appropriate for emergency situations. It takes many days to process the donated blood. RISKS AND COMPLICATIONS Although transfusion therapy is very safe and saves many lives, the main dangers  of transfusion include:  Getting an infectious disease. Developing a transfusion reaction. This is an allergic reaction to something in the blood you were given. Every precaution is taken to prevent this. The decision to have a blood transfusion has been considered carefully by your caregiver before blood is given. Blood is not given unless the benefits outweigh the risks.  AFTER SURGERY INSTRUCTIONS  Return to work: 4-6 weeks if applicable  After surgery, we will start you on the prophylactic dosing of Eliquis (2.5 mg twice daily) for at least one week. We will defer to your PCP about increasing the dose after that if needed.  Activity: 1. Be up and out of the bed during the day.  Take a nap if needed.  You may walk up steps but be careful and use the hand rail.  Stair climbing will tire you more than you think, you may need to stop part way and rest.   2. No lifting or straining for 6 weeks over 10 pounds. No pushing, pulling, straining for 6 weeks.  3. No driving for around 1 week(s).  Do not  drive if you are taking narcotic pain medicine and make sure that your reaction time has returned.   4. You can shower as soon as the next day after surgery. Shower daily.  Use your regular soap and water (not directly on the incision) and pat your incision(s) dry afterwards; don't rub.  No tub baths or submerging your body in water until cleared by your surgeon. If you have the soap that was given to you by pre-surgical testing that was used before surgery, you do not need to use it afterwards because this can irritate your incisions.   5. No sexual activity and nothing in the vagina for 10-12 weeks.  6. You may experience a small amount of clear drainage from your incisions, which is normal.  If the drainage persists, increases, or changes color please call the office.  7. Do not use creams, lotions, or ointments such as neosporin on your incisions after surgery until advised by your surgeon because they can cause removal of the dermabond glue on your incisions.    8. You may experience vaginal spotting after surgery or around the 6-8 week mark from surgery when the stitches at the top of the vagina begin to dissolve.  The spotting is normal but if you experience heavy bleeding, call our office.  9. Take Tylenol first for pain if you are able to take these medication and only use narcotic pain medication for severe pain not relieved by the Tylenol.  Monitor your Tylenol intake to a max of 4,000 mg in a 24 hour period.   Diet: 1. Low sodium Heart Healthy Diet is recommended but you are cleared to resume your normal (before surgery) diet after your procedure.  2. It is safe to use a laxative, such as Miralax or Colace, if you have difficulty moving your bowels. You have been prescribed Sennakot-S to take at bedtime every evening after surgery to keep bowel movements regular and to prevent constipation.    Wound Care: 1. Keep clean and dry.  Shower daily.  Reasons to call the Doctor: Fever -  Oral temperature greater than 100.4 degrees Fahrenheit Foul-smelling vaginal discharge Difficulty urinating Nausea and vomiting Increased pain at the site of the incision that is unrelieved with pain medicine. Difficulty breathing with or without chest pain New calf pain especially if only on one side Sudden, continuing increased vaginal bleeding  with or without clots.   Contacts: For questions or concerns you should contact:  Dr. Eugene Garnet at [phone]  Warner Mccreedy, NP at [phone]  After Hours: call [phone] and have the GYN Oncologist paged/contacted (after 5 pm or on the weekends). You will speak with an after hours RN and let he or she know you have had surgery.  Messages sent via mychart are for non-urgent matters and are not responded to after hours so for urgent needs, please call the after hours number.

## 2023-04-30 LAB — HCV INTERPRETATION

## 2023-04-30 LAB — HCV AB W REFLEX TO QUANT PCR: HCV Ab: NONREACTIVE

## 2023-04-30 MED ORDER — APIXABAN 2.5 MG PO TABS
2.5000 mg | ORAL_TABLET | Freq: Two times a day (BID) | ORAL | 0 refills | Status: DC
Start: 2023-05-07 — End: 2023-05-06

## 2023-04-30 NOTE — Anesthesia Preprocedure Evaluation (Signed)
Anesthesia Evaluation  Patient identified by MRN, date of birth, ID band Patient awake    Reviewed: Allergy & Precautions, NPO status , Patient's Chart, lab work & pertinent test results  Airway Mallampati: II  TM Distance: >3 FB Neck ROM: Full    Dental  (+) Chipped,    Pulmonary former smoker   Pulmonary exam normal        Cardiovascular hypertension, Pt. 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 Left ventricular ejection fraction, by estimation, is 55 to 60%. The  left ventricle has normal function. The left ventricle has no regional  wall motion abnormalities. Left ventricular diastolic parameters were  normal.   2. Right ventricular systolic function is normal. The right ventricular  size is normal.   3. The mitral valve is abnormal. Mild mitral valve regurgitation. No  evidence of mitral stenosis.   4. The aortic valve is tricuspid. There is mild calcification of the  aortic valve. There is mild thickening of the aortic valve. Aortic valve  regurgitation is not visualized. Aortic valve sclerosis is present, with  no evidence of aortic valve stenosis.   5. The inferior vena cava is normal in size with greater than 50%  respiratory variability, suggesting right atrial pressure of 3 mmHg.      Neuro/Psych  negative psych ROS   GI/Hepatic negative GI ROS, Neg liver ROS,,,  Endo/Other  diabetes, Type 2, Oral Hypoglycemic Agents    Renal/GU Renal InsufficiencyRenal disease  Female genitourinary complaint: Endometrial CA.     Musculoskeletal  (+) Arthritis ,    Abdominal  (+) + obese  Peds  Hematology  (+) Blood dyscrasia (Eliquis), anemia Lab Results      Component                Value               Date                      WBC                      11.6 (H)            04/28/2023                HGB                      12.5                04/28/2023                HCT                       37.4                04/28/2023                MCV                      85.4                04/28/2023                PLT                      268  04/28/2023              Anesthesia Other Findings ENDOMETRIAL CANCER  Reproductive/Obstetrics                             Anesthesia Physical Anesthesia Plan  ASA: 3  Anesthesia Plan: General   Post-op Pain Management: Lidocaine infusion* and Ofirmev IV (intra-op)*   Induction: Intravenous  PONV Risk Score and Plan: 4 or greater and Treatment may vary due to age or medical condition, Ondansetron, Dexamethasone and Propofol infusion  Airway Management Planned: Oral ETT  Additional Equipment:   Intra-op Plan:   Post-operative Plan: Extubation in OR  Informed Consent: I have reviewed the patients History and Physical, chart, labs and discussed the procedure including the risks, benefits and alternatives for the proposed anesthesia with the patient or authorized representative who has indicated his/her understanding and acceptance.     Dental advisory given  Plan Discussed with: CRNA  Anesthesia Plan Comments: (PAT note from 5/10 by K Gekas PA-C)        Anesthesia Quick Evaluation

## 2023-04-30 NOTE — Patient Instructions (Signed)
Preparing for your Surgery  Plan for surgery on May 05, 2023 (will call to confirm date as soon as we here from the OR) with Dr. Katherine Tucker at [facility]. You will be scheduled for robotic assisted total laparoscopic hysterectomy (removal of the uterus and cervix), bilateral salpingo-oophorectomy (removal of both ovaries and fallopian tubes), sentinel lymph node biopsy, possible lymph node dissection, possible laparotomy (larger incision on your abdomen if needed).  Pre-operative Testing -You will receive a phone call from presurgical testing at [facility] to arrange for a pre-operative appointment and lab work.  -Bring your insurance card, copy of an advanced directive if applicable, medication list  -At that visit, you will be asked to sign a consent for a possible blood transfusion in case a transfusion becomes necessary during surgery.  The need for a blood transfusion is rare but having consent is a necessary part of your care.     -You should not be taking blood thinners or aspirin at least ten days prior to surgery unless instructed by your surgeon.  -Do not take supplements such as fish oil (omega 3), red yeast rice, turmeric before your surgery. You want to avoid medications with aspirin in them including headache powders such as BC or Goody's), Excedrin migraine.  Day Before Surgery at Home -You will be asked to take in a light diet the day before surgery. You will be advised you can have clear liquids up until 3 hours before your surgery.    Eat a light diet the day before surgery.  Examples including soups, broths, toast, yogurt, mashed potatoes.  AVOID GAS PRODUCING FOODS AND BEVERAGES. Things to avoid include carbonated beverages (fizzy beverages, sodas), raw fruits and raw vegetables (uncooked), or beans.   If your bowels are filled with gas, your surgeon will have difficulty visualizing your pelvic organs which increases your surgical risks.  Your role  in recovery Your role is to become active as soon as directed by your doctor, while still giving yourself time to heal.  Rest when you feel tired. You will be asked to do the following in order to speed your recovery:  - Cough and breathe deeply. This helps to clear and expand your lungs and can prevent pneumonia after surgery.  - STAY ACTIVE WHEN YOU GET HOME. Do mild physical activity. Walking or moving your legs help your circulation and body functions return to normal. Do not try to get up or walk alone the first time after surgery.   -If you develop swelling on one leg or the other, pain in the back of your leg, redness/warmth in one of your legs, please call the office or go to the Emergency Room to have a doppler to rule out a blood clot. For shortness of breath, chest pain-seek care in the Emergency Room as soon as possible. - Actively manage your pain. Managing your pain lets you move in comfort. We will ask you to rate your pain on a scale of zero to 10. It is your responsibility to tell your doctor or nurse where and how much you hurt so your pain can be treated.  Special Considerations -If you are diabetic, you may be placed on insulin after surgery to have closer control over your blood sugars to promote healing and recovery.  This does not mean that you will be discharged on insulin.  If applicable, your oral antidiabetics will be resumed when you are tolerating a solid diet.  -Your final pathology results   from surgery should be available around one week after surgery and the results will be relayed to you when available.  -FMLA forms can be faxed to [phone] and please allow 5-7 business days for completion.  Pain Management After Surgery -You have been prescribed your pain medication and bowel regimen medications before surgery so that you can have these available when you are discharged from the hospital. The pain medication is for use ONLY AFTER surgery and a new prescription  will not be given.   -Make sure that you have Tylenol IF YOU ARE ABLE TO TAKE THESE MEDICATION at home to use on a regular basis after surgery for pain control.   -Review the attached handout on narcotic use and their risks and side effects.   Bowel Regimen -You have been prescribed Sennakot-S to take nightly to prevent constipation especially if you are taking the narcotic pain medication intermittently.  It is important to prevent constipation and drink adequate amounts of liquids. You can stop taking this medication when you are not taking pain medication and you are back on your normal bowel routine.  Risks of Surgery Risks of surgery are low but include bleeding, infection, damage to surrounding structures, re-operation, blood clots, and very rarely death.   Blood Transfusion Information (For the consent to be signed before surgery)  We will be checking your blood type before surgery so in case of emergencies, we will know what type of blood you would need.                                            WHAT IS A BLOOD TRANSFUSION?  A transfusion is the replacement of blood or some of its parts. Blood is made up of multiple cells which provide different functions. Red blood cells carry oxygen and are used for blood loss replacement. White blood cells fight against infection. Platelets control bleeding. Plasma helps clot blood. Other blood products are available for specialized needs, such as hemophilia or other clotting disorders. BEFORE THE TRANSFUSION  Who gives blood for transfusions?  You may be able to donate blood to be used at a later date on yourself (autologous donation). Relatives can be asked to donate blood. This is generally not any safer than if you have received blood from a stranger. The same precautions are taken to ensure safety when a relative's blood is donated. Healthy volunteers who are fully evaluated to make sure their blood is safe. This is blood bank  blood. Transfusion therapy is the safest it has ever been in the practice of medicine. Before blood is taken from a donor, a complete history is taken to make sure that person has no history of diseases nor engages in risky social behavior (examples are intravenous drug use or sexual activity with multiple partners). The donor's travel history is screened to minimize risk of transmitting infections, such as malaria. The donated blood is tested for signs of infectious diseases, such as HIV and hepatitis. The blood is then tested to be sure it is compatible with you in order to minimize the chance of a transfusion reaction. If you or a relative donates blood, this is often done in anticipation of surgery and is not appropriate for emergency situations. It takes many days to process the donated blood. RISKS AND COMPLICATIONS Although transfusion therapy is very safe and saves many lives, the main dangers   of transfusion include:  Getting an infectious disease. Developing a transfusion reaction. This is an allergic reaction to something in the blood you were given. Every precaution is taken to prevent this. The decision to have a blood transfusion has been considered carefully by your caregiver before blood is given. Blood is not given unless the benefits outweigh the risks.  AFTER SURGERY INSTRUCTIONS  Return to work: 4-6 weeks if applicable  After surgery, we will start you on the prophylactic dosing of Eliquis (2.5 mg twice daily) for at least one week. We will defer to your PCP about increasing the dose after that if needed.  Activity: 1. Be up and out of the bed during the day.  Take a nap if needed.  You may walk up steps but be careful and use the hand rail.  Stair climbing will tire you more than you think, you may need to stop part way and rest.   2. No lifting or straining for 6 weeks over 10 pounds. No pushing, pulling, straining for 6 weeks.  3. No driving for around 1 week(s).  Do not  drive if you are taking narcotic pain medicine and make sure that your reaction time has returned.   4. You can shower as soon as the next day after surgery. Shower daily.  Use your regular soap and water (not directly on the incision) and pat your incision(s) dry afterwards; don't rub.  No tub baths or submerging your body in water until cleared by your surgeon. If you have the soap that was given to you by pre-surgical testing that was used before surgery, you do not need to use it afterwards because this can irritate your incisions.   5. No sexual activity and nothing in the vagina for 10-12 weeks.  6. You may experience a small amount of clear drainage from your incisions, which is normal.  If the drainage persists, increases, or changes color please call the office.  7. Do not use creams, lotions, or ointments such as neosporin on your incisions after surgery until advised by your surgeon because they can cause removal of the dermabond glue on your incisions.    8. You may experience vaginal spotting after surgery or around the 6-8 week mark from surgery when the stitches at the top of the vagina begin to dissolve.  The spotting is normal but if you experience heavy bleeding, call our office.  9. Take Tylenol first for pain if you are able to take these medication and only use narcotic pain medication for severe pain not relieved by the Tylenol.  Monitor your Tylenol intake to a max of 4,000 mg in a 24 hour period.   Diet: 1. Low sodium Heart Healthy Diet is recommended but you are cleared to resume your normal (before surgery) diet after your procedure.  2. It is safe to use a laxative, such as Miralax or Colace, if you have difficulty moving your bowels. You have been prescribed Sennakot-S to take at bedtime every evening after surgery to keep bowel movements regular and to prevent constipation.    Wound Care: 1. Keep clean and dry.  Shower daily.  Reasons to call the Doctor: Fever -  Oral temperature greater than 100.4 degrees Fahrenheit Foul-smelling vaginal discharge Difficulty urinating Nausea and vomiting Increased pain at the site of the incision that is unrelieved with pain medicine. Difficulty breathing with or without chest pain New calf pain especially if only on one side Sudden, continuing increased vaginal bleeding   with or without clots.   Contacts: For questions or concerns you should contact:  Dr. Katherine Tucker at [phone]  Merwin Breden, NP at [phone]  After Hours: call [phone] and have the GYN Oncologist paged/contacted (after 5 pm or on the weekends). You will speak with an after hours RN and let he or she know you have had surgery.  Messages sent via mychart are for non-urgent matters and are not responded to after hours so for urgent needs, please call the after hours number.      

## 2023-04-30 NOTE — Progress Notes (Addendum)
Case: [id number] Date/Time: 05/05/23 1445   Procedures:      XI ROBOTIC ASSISTED TOTAL HYSTERECTOMY WITH BILATERAL SALPINGO OOPHORECTOMY, POSSIBLE LAPAROTOMY     SENTINEL NODE BIOPSY     POSSIBLE LYMPH NODE DISSECTION   Anesthesia type: General   Pre-op diagnosis: ENDOMETRIAL CANCER   Location: [facility] ROOM 05 / [facility]   Surgeons: Carver Fila, MD       DISCUSSION: Yvonne [name] is a 77 year old female who is being evaluated prior to surgery listed above.  Patient started to have heavy vaginal bleeding after restarting Xarelto for history of unprovoked DVT ~04/16/23.  Had an endometrial biopsy which showed cancer and now having surgery listed above.  Other past medical history includes former smoker, hypertension, history of right-sided breast cancer s/p mastectomy, diabetes.  She follows with internal medicine clinic.  Last hemoglobin A1c was 5.9 on 03/30/2023.  No prior anesthesia complications  VS: There were no vitals taken for this visit.  PROVIDERS: Lyndle Herrlich, MD   LABS: Labs reviewed: Acceptable for surgery. Repeat DOS (all labs ordered are listed, but only abnormal results are displayed)  Labs Reviewed - No data to display   IMAGES:  CXR 12/02/22:  IMPRESSION: No active cardiopulmonary disease.   EKG 11/30/22:  NSR with 1st degree AV block Prolonged QT   CV:  Echo 04/18/23:  IMPRESSIONS     1. Left ventricular ejection fraction, by estimation, is 55 to 60%. The  left ventricle has normal function. The left ventricle has no regional  wall motion abnormalities. Left ventricular diastolic parameters were  normal.   2. Right ventricular systolic function is normal. The right ventricular  size is normal.   3. The mitral valve is abnormal. Mild mitral valve regurgitation. No  evidence of mitral stenosis.   4. The aortic valve is tricuspid. There is mild calcification of the  aortic valve. There is mild thickening of the aortic valve. Aortic  valve  regurgitation is not visualized. Aortic valve sclerosis is present, with  no evidence of aortic valve stenosis.   5. The inferior vena cava is normal in size with greater than 50%  respiratory variability, suggesting right atrial pressure of 3 mmHg.   Past Medical History:  Diagnosis Date   Acute deep vein thrombosis (DVT) of popliteal vein of right lower extremity (HCC) 09/12/2020   AKI (acute kidney injury) (HCC) 02/09/2013   Arthritis    Breast cancer (HCC) 04/2012   right breast   Bruises easily    Cancer (HCC) 03/25/2012   right breast   Chronic back pain    arthritis   Diabetes mellitus without complication (HCC)    Elevated cholesterol    takes Crestor daily   Hemorrhoids    History of chemotherapy    x 1, pt unable to tolerate, on PO chemotherpay   Hypertension    takes Maxzide daily    Phlebitis 72yrs ago   hx of-   Seasonal allergies    takes Zyrtec daily and Nasal Spray prn    Past Surgical History:  Procedure Laterality Date   APPENDECTOMY  75yrs ago   BREAST EXCISIONAL BIOPSY Left    BREAST SURGERY  04/27/12   Right mod mastectomy, ER/PR +, HER2 -   BREAST SURGERY  04/27/12   Left Breast NL lumpectomy-no malignancy   COLONOSCOPY     MASTECTOMY Right 2013   PORT-A-CATH REMOVAL  11/24/2012   Procedure: REMOVAL PORT-A-CATH;  Surgeon: [name], DO;  Location: [facility];  Service: General;  Laterality: Left;   PORTACATH PLACEMENT  04/26/2012   Procedure: INSERTION PORT-A-CATH;  Surgeon: [name], DO;  Location: [facility];  Service: General;  Laterality: Left;  Started at 1746.   TUBAL LIGATION  52yrs ago    MEDICATIONS: No current facility-administered medications for this encounter.    amLODipine (NORVASC) 5 MG tablet   diclofenac Sodium (VOLTAREN) 1 % GEL   gabapentin (NEURONTIN) 300 MG capsule   glucose blood ([employer] GLUCOSE METER TEST STRIPS) test strip   Lancets (FREESTYLE) lancets   medroxyPROGESTERone (PROVERA) 10 MG tablet    metFORMIN (GLUCOPHAGE) 500 MG tablet   potassium chloride SA (KLOR-CON M) 20 MEQ tablet   rosuvastatin (CRESTOR) 40 MG tablet   senna-docusate (SENOKOT-S) 8.6-50 MG tablet   traMADol (ULTRAM) 50 MG tablet   Ubaldo Glassing, PA-C [facility] Surgical Short Stay/Anesthesiology [facility] Phone [phone] 04/30/2023 10:51 AM

## 2023-04-30 NOTE — Progress Notes (Signed)
Patient here for new patient consultation with [name] and for a pre-operative appointment prior to her scheduled surgery on May 05, 2023. She is scheduled for robotic assisted total laparoscopic hysterectomy, bilateral salpingo-oophorectomy, sentinel lymph node biopsy, possible lymph node dissection, possible laparotomy. The surgery was discussed in detail.  See after visit summary for additional details. Visual aids used to discuss items related to surgery including the incentive spirometer, sequential compression stockings, foley catheter, IV pump, multi-modal pain regimen including tylenol, photo of the surgical robot, female reproductive system to discuss surgery in detail.      Discussed post-op pain management in detail including the aspects of the enhanced recovery pathway.  Advised her that a new prescription would be sent in for tramadol and it is only to be used for after her upcoming surgery.  We discussed the use of tylenol post-op and to monitor for a maximum of 4,000 mg in a 24 hour period.  Also prescribed sennakot to be used after surgery and to hold if having loose stools.  Discussed bowel regimen in detail.     Discussed the use of SCDs and measures to take at home to prevent DVT including frequent mobility.  Reportable signs and symptoms of DVT discussed. Post-operative instructions discussed and expectations for after surgery. Incisional care discussed as well including reportable signs and symptoms including erythema, drainage, wound separation.     10 minutes spent with the patient/preparing information.  Verbalizing understanding of material discussed. No needs or concerns voiced at the end of the visit.   Advised patient to call for any needs.  Advised that her post-operative medications had been prescribed and could be picked up at any time.    This appointment is included in the global surgical bundle as pre-operative teaching and has no charge.

## 2023-05-03 ENCOUNTER — Telehealth: Payer: Self-pay | Admitting: *Deleted

## 2023-05-03 NOTE — Telephone Encounter (Signed)
Pt was called to reassure her that if she doesn't meet discharge criteria on Wednesday after her surgery they will keep her overnight.   Pt advised that she will receive a pre-op call from our office tomorrow. Pt has no further questions or concerns at this time.

## 2023-05-03 NOTE — Telephone Encounter (Signed)
Received a call from Pt's daughter, Sharen Heck. Daughter states she wanted to see if it was an option for her mother Ms. Copher to stay overnight after her surgery on Wednesday, May 15 th.  instead of going home the same day.   Daughter states her mother was feeling anxious about going home the same day and wasn't sure how she was going to feel.  Pt reassured that if it was medically necessary patient would be kept in the hospital overnight. Pt's daughter requested that the message be passed on to [name].

## 2023-05-04 ENCOUNTER — Telehealth: Payer: Self-pay | Admitting: *Deleted

## 2023-05-04 NOTE — Telephone Encounter (Signed)
Telephone call to check on pre-operative status.  Patient compliant with pre-operative instructions.  Reinforced nothing to eat after midnight. Clear liquids until 1145. Patient to arrive at 1245.  No questions or concerns voiced.  Instructed to call for any needs.

## 2023-05-05 ENCOUNTER — Encounter (HOSPITAL_COMMUNITY)
Admission: RE | Disposition: A | Discharge status: Home / Self Care | Payer: Self-pay | Source: Home / Self Care | Attending: Gynecologic Oncology

## 2023-05-05 ENCOUNTER — Ambulatory Visit (HOSPITAL_BASED_OUTPATIENT_CLINIC_OR_DEPARTMENT_OTHER): Payer: Medicare Other | Admitting: Medical

## 2023-05-05 ENCOUNTER — Ambulatory Visit (HOSPITAL_COMMUNITY)
Admission: RE | Admit: 2023-05-05 | Discharge: 2023-05-06 | Disposition: A | Discharge status: Home / Self Care | Payer: Medicare Other | Attending: Gynecologic Oncology | Admitting: Gynecologic Oncology

## 2023-05-05 ENCOUNTER — Other Ambulatory Visit: Payer: Self-pay

## 2023-05-05 ENCOUNTER — Ambulatory Visit (HOSPITAL_COMMUNITY): Payer: Medicare Other | Admitting: Medical

## 2023-05-05 ENCOUNTER — Encounter (HOSPITAL_COMMUNITY): Payer: Self-pay | Admitting: Gynecologic Oncology

## 2023-05-05 DIAGNOSIS — N8003 Adenomyosis of the uterus: Secondary | ICD-10-CM | POA: Diagnosis not present

## 2023-05-05 DIAGNOSIS — I10 Essential (primary) hypertension: Secondary | ICD-10-CM

## 2023-05-05 DIAGNOSIS — Z86718 Personal history of other venous thrombosis and embolism: Secondary | ICD-10-CM | POA: Insufficient documentation

## 2023-05-05 DIAGNOSIS — Z01818 Encounter for other preprocedural examination: Secondary | ICD-10-CM

## 2023-05-05 DIAGNOSIS — N72 Inflammatory disease of cervix uteri: Secondary | ICD-10-CM | POA: Diagnosis not present

## 2023-05-05 DIAGNOSIS — Z87891 Personal history of nicotine dependence: Secondary | ICD-10-CM

## 2023-05-05 DIAGNOSIS — Z7984 Long term (current) use of oral hypoglycemic drugs: Secondary | ICD-10-CM | POA: Diagnosis not present

## 2023-05-05 DIAGNOSIS — Z9011 Acquired absence of right breast and nipple: Secondary | ICD-10-CM | POA: Insufficient documentation

## 2023-05-05 DIAGNOSIS — C541 Malignant neoplasm of endometrium: Secondary | ICD-10-CM | POA: Diagnosis present

## 2023-05-05 DIAGNOSIS — N83202 Unspecified ovarian cyst, left side: Secondary | ICD-10-CM | POA: Diagnosis not present

## 2023-05-05 DIAGNOSIS — Z853 Personal history of malignant neoplasm of breast: Secondary | ICD-10-CM | POA: Insufficient documentation

## 2023-05-05 DIAGNOSIS — K429 Umbilical hernia without obstruction or gangrene: Secondary | ICD-10-CM | POA: Diagnosis not present

## 2023-05-05 DIAGNOSIS — Z7901 Long term (current) use of anticoagulants: Secondary | ICD-10-CM | POA: Diagnosis not present

## 2023-05-05 DIAGNOSIS — N83201 Unspecified ovarian cyst, right side: Secondary | ICD-10-CM | POA: Diagnosis not present

## 2023-05-05 DIAGNOSIS — E119 Type 2 diabetes mellitus without complications: Secondary | ICD-10-CM | POA: Insufficient documentation

## 2023-05-05 HISTORY — DX: Type 2 diabetes mellitus without complications: E11.9

## 2023-05-05 HISTORY — PX: UMBILICAL HERNIA REPAIR: SHX196

## 2023-05-05 HISTORY — PX: ROBOTIC ASSISTED TOTAL HYSTERECTOMY WITH BILATERAL SALPINGO OOPHERECTOMY: SHX6086

## 2023-05-05 HISTORY — PX: SENTINEL NODE BIOPSY: SHX6608

## 2023-05-05 LAB — CBC
HCT: 35.2 % — ABNORMAL LOW (ref 36.0–46.0)
Hemoglobin: 11.4 g/dL — ABNORMAL LOW (ref 12.0–15.0)
MCH: 28.5 pg (ref 26.0–34.0)
MCHC: 32.4 g/dL (ref 30.0–36.0)
MCV: 88 fL (ref 80.0–100.0)
Platelets: 255 10*3/uL (ref 150–400)
RBC: 4 MIL/uL (ref 3.87–5.11)
RDW: 15.9 % — ABNORMAL HIGH (ref 11.5–15.5)
WBC: 13.9 10*3/uL — ABNORMAL HIGH (ref 4.0–10.5)
nRBC: 0 % (ref 0.0–0.2)

## 2023-05-05 LAB — GLUCOSE, CAPILLARY
Glucose-Capillary: 122 mg/dL — ABNORMAL HIGH (ref 70–99)
Glucose-Capillary: 136 mg/dL — ABNORMAL HIGH (ref 70–99)
Glucose-Capillary: 150 mg/dL — ABNORMAL HIGH (ref 70–99)

## 2023-05-05 LAB — COMPREHENSIVE METABOLIC PANEL
ALT: 16 U/L (ref 0–44)
AST: 25 U/L (ref 15–41)
Albumin: 4 g/dL (ref 3.5–5.0)
Alkaline Phosphatase: 50 U/L (ref 38–126)
Anion gap: 13 (ref 5–15)
BUN: 14 mg/dL (ref 8–23)
CO2: 21 mmol/L — ABNORMAL LOW (ref 22–32)
Calcium: 9.3 mg/dL (ref 8.9–10.3)
Chloride: 103 mmol/L (ref 98–111)
Creatinine, Ser: 0.96 mg/dL (ref 0.44–1.00)
GFR, Estimated: >60 mL/min (ref >60)
Glucose, Bld: 144 mg/dL — ABNORMAL HIGH (ref 70–99)
Potassium: 3.8 mmol/L (ref 3.5–5.1)
Sodium: 137 mmol/L (ref 135–145)
Total Bilirubin: 0.9 mg/dL (ref 0.3–1.2)
Total Protein: 7.8 g/dL (ref 6.5–8.1)

## 2023-05-05 LAB — TYPE AND SCREEN
ABO/RH(D): A POS
Antibody Screen: NEGATIVE

## 2023-05-05 SURGERY — HYSTERECTOMY, TOTAL, ROBOT-ASSISTED, LAPAROSCOPIC, WITH BILATERAL SALPINGO-OOPHORECTOMY
Anesthesia: General

## 2023-05-05 MED ORDER — AMLODIPINE BESYLATE 5 MG PO TABS: 5.0000 mg | ORAL_TABLET | Freq: Every day | ORAL | Status: DC

## 2023-05-05 MED ORDER — FENTANYL CITRATE PF 50 MCG/ML IJ SOSY
25.0000 ug | PREFILLED_SYRINGE | INTRAMUSCULAR | Status: DC | PRN
  Administered 2023-05-05: 50 ug via INTRAVENOUS

## 2023-05-05 MED ORDER — TRAMADOL HCL 50 MG PO TABS: 50.0000 mg | ORAL_TABLET | Freq: Four times a day (QID) | ORAL | Status: DC | PRN

## 2023-05-05 MED ORDER — HEMOSTATIC AGENTS (NO CHARGE) OPTIME
TOPICAL | Status: DC | PRN
  Administered 2023-05-05: 1 via TOPICAL

## 2023-05-05 MED ORDER — ORAL CARE MOUTH RINSE: 15.0000 mL | Freq: Once | OROMUCOSAL | Status: AC

## 2023-05-05 MED ORDER — HYDROMORPHONE HCL 1 MG/ML IJ SOLN
0.2000 mg | INTRAMUSCULAR | Status: DC | PRN
  Administered 2023-05-06: 0.5 mg via INTRAVENOUS
  Filled 2023-05-05: qty 1

## 2023-05-05 MED ORDER — AMISULPRIDE (ANTIEMETIC) 5 MG/2ML IV SOLN: 10.0000 mg | Freq: Once | INTRAVENOUS | Status: DC | PRN

## 2023-05-05 MED ORDER — OXYCODONE HCL 5 MG PO TABS
5.0000 mg | ORAL_TABLET | ORAL | Status: DC | PRN
  Administered 2023-05-05 – 2023-05-06 (×2): 5 mg via ORAL
  Filled 2023-05-05 (×2): qty 1

## 2023-05-05 MED ORDER — ONDANSETRON HCL 4 MG PO TABS: 4.0000 mg | ORAL_TABLET | Freq: Four times a day (QID) | ORAL | Status: DC | PRN

## 2023-05-05 MED ORDER — BUPIVACAINE HCL 0.25 % IJ SOLN
INTRAMUSCULAR | Status: DC | PRN
  Administered 2023-05-05: 28 mL

## 2023-05-05 MED ORDER — CEFAZOLIN SODIUM-DEXTROSE 2-4 GM/100ML-% IV SOLN
2.0000 g | INTRAVENOUS | Status: AC
  Administered 2023-05-05: 2 g via INTRAVENOUS
  Filled 2023-05-05: qty 100

## 2023-05-05 MED ORDER — LACTATED RINGERS IR SOLN: Status: DC | PRN

## 2023-05-05 MED ORDER — ROSUVASTATIN CALCIUM 20 MG PO TABS: 40.0000 mg | ORAL_TABLET | Freq: Every day | ORAL | Status: DC

## 2023-05-05 MED ORDER — CHLORHEXIDINE GLUCONATE 0.12 % MT SOLN
15.0000 mL | Freq: Once | OROMUCOSAL | Status: AC
  Administered 2023-05-05: 15 mL via OROMUCOSAL

## 2023-05-05 MED ORDER — LACTATED RINGERS IV SOLN: INTRAVENOUS | Status: DC

## 2023-05-05 MED ORDER — FENTANYL CITRATE PF 50 MCG/ML IJ SOSY
PREFILLED_SYRINGE | INTRAMUSCULAR | Status: AC
  Administered 2023-05-05: 50 ug via INTRAVENOUS
  Filled 2023-05-05: qty 2

## 2023-05-05 MED ORDER — STERILE WATER FOR IRRIGATION IR SOLN
Status: DC | PRN
  Administered 2023-05-05: 3000 mL

## 2023-05-05 MED ORDER — PHENYLEPHRINE HCL (PRESSORS) 10 MG/ML IV SOLN
INTRAVENOUS | Status: AC
  Filled 2023-05-05: qty 1

## 2023-05-05 MED ORDER — ENOXAPARIN SODIUM 40 MG/0.4ML IJ SOSY: 40.0000 mg | PREFILLED_SYRINGE | INTRAMUSCULAR | Status: DC

## 2023-05-05 MED ORDER — PROPOFOL 10 MG/ML IV BOLUS
INTRAVENOUS | Status: DC | PRN
  Administered 2023-05-05: 25 ug/kg/min via INTRAVENOUS
  Administered 2023-05-05: 200 mg via INTRAVENOUS

## 2023-05-05 MED ORDER — ROCURONIUM BROMIDE 10 MG/ML (PF) SYRINGE
PREFILLED_SYRINGE | INTRAVENOUS | Status: DC | PRN
  Administered 2023-05-05: 40 mg via INTRAVENOUS
  Administered 2023-05-05: 60 mg via INTRAVENOUS
  Administered 2023-05-05: 20 mg via INTRAVENOUS

## 2023-05-05 MED ORDER — INDOCYANINE GREEN 25 MG IV SOLR
INTRAVENOUS | Status: DC | PRN
  Administered 2023-05-05: 2.5 mg

## 2023-05-05 MED ORDER — DEXAMETHASONE SODIUM PHOSPHATE 4 MG/ML IJ SOLN: 4.0000 mg | INTRAMUSCULAR | Status: DC

## 2023-05-05 MED ORDER — STERILE WATER FOR INJECTION IJ SOLN
INTRAMUSCULAR | Status: AC
  Filled 2023-05-05: qty 10

## 2023-05-05 MED ORDER — FENTANYL CITRATE (PF) 100 MCG/2ML IJ SOLN
INTRAMUSCULAR | Status: DC | PRN
  Administered 2023-05-05 (×2): 50 ug via INTRAVENOUS
  Administered 2023-05-05: 75 ug via INTRAVENOUS

## 2023-05-05 MED ORDER — KCL IN DEXTROSE-NACL 10-5-0.45 MEQ/L-%-% IV SOLN
INTRAVENOUS | Status: DC
  Filled 2023-05-05 (×2): qty 1000

## 2023-05-05 MED ORDER — ACETAMINOPHEN 500 MG PO TABS
1000.0000 mg | ORAL_TABLET | Freq: Four times a day (QID) | ORAL | Status: DC
  Administered 2023-05-05: 1000 mg via ORAL
  Filled 2023-05-05: qty 2

## 2023-05-05 MED ORDER — LIDOCAINE HCL (PF) 2 % IJ SOLN
INTRAMUSCULAR | Status: AC
  Filled 2023-05-05: qty 15

## 2023-05-05 MED ORDER — INDOCYANINE GREEN 25 MG IV SOLR
INTRAVENOUS | Status: AC
  Filled 2023-05-05: qty 10

## 2023-05-05 MED ORDER — STERILE WATER FOR IRRIGATION IR SOLN
Status: DC | PRN
  Administered 2023-05-05: 1000 mL

## 2023-05-05 MED ORDER — ROCURONIUM BROMIDE 10 MG/ML (PF) SYRINGE
PREFILLED_SYRINGE | INTRAVENOUS | Status: AC
  Filled 2023-05-05: qty 10

## 2023-05-05 MED ORDER — HEPARIN SODIUM (PORCINE) 5000 UNIT/ML IJ SOLN
5000.0000 [IU] | INTRAMUSCULAR | Status: AC
  Administered 2023-05-05: 5000 [IU] via SUBCUTANEOUS
  Filled 2023-05-05: qty 1

## 2023-05-05 MED ORDER — LACTATED RINGERS IV SOLN: INTRAVENOUS | Status: DC | PRN

## 2023-05-05 MED ORDER — STERILE WATER FOR INJECTION IJ SOLN
INTRAMUSCULAR | Status: DC | PRN
  Administered 2023-05-05: 10 mL

## 2023-05-05 MED ORDER — GABAPENTIN 400 MG PO CAPS
900.0000 mg | ORAL_CAPSULE | Freq: Every day | ORAL | Status: DC
  Administered 2023-05-05: 900 mg via ORAL
  Filled 2023-05-05: qty 1

## 2023-05-05 MED ORDER — LIDOCAINE 2% (20 MG/ML) 5 ML SYRINGE
INTRAMUSCULAR | Status: DC | PRN
  Administered 2023-05-05: 60 mg via INTRAVENOUS
  Administered 2023-05-05: 1.5 mg/kg/h via INTRAVENOUS

## 2023-05-05 MED ORDER — PROPOFOL 10 MG/ML IV BOLUS
INTRAVENOUS | Status: AC
  Filled 2023-05-05: qty 20

## 2023-05-05 MED ORDER — 0.9 % SODIUM CHLORIDE (POUR BTL) OPTIME
TOPICAL | Status: DC | PRN
  Administered 2023-05-05: 1000 mL

## 2023-05-05 MED ORDER — FENTANYL CITRATE (PF) 250 MCG/5ML IJ SOLN
INTRAMUSCULAR | Status: AC
  Filled 2023-05-05: qty 5

## 2023-05-05 MED ORDER — ONDANSETRON HCL 4 MG/2ML IJ SOLN
INTRAMUSCULAR | Status: AC
  Filled 2023-05-05: qty 4

## 2023-05-05 MED ORDER — ONDANSETRON HCL 4 MG/2ML IJ SOLN: 4.0000 mg | Freq: Once | INTRAMUSCULAR | Status: DC | PRN

## 2023-05-05 MED ORDER — SUGAMMADEX SODIUM 200 MG/2ML IV SOLN
INTRAVENOUS | Status: DC | PRN
  Administered 2023-05-05: 300 mg via INTRAVENOUS

## 2023-05-05 MED ORDER — DEXAMETHASONE SODIUM PHOSPHATE 4 MG/ML IJ SOLN
4.0000 mg | INTRAMUSCULAR | Status: AC
  Administered 2023-05-05: 4 mg via INTRAVENOUS

## 2023-05-05 MED ORDER — DEXAMETHASONE SODIUM PHOSPHATE 10 MG/ML IJ SOLN
INTRAMUSCULAR | Status: AC
  Filled 2023-05-05: qty 1

## 2023-05-05 MED ORDER — ONDANSETRON HCL 4 MG/2ML IJ SOLN: 4.0000 mg | Freq: Four times a day (QID) | INTRAMUSCULAR | Status: DC | PRN

## 2023-05-05 MED ORDER — BUPIVACAINE HCL 0.25 % IJ SOLN
INTRAMUSCULAR | Status: AC
  Filled 2023-05-05: qty 1

## 2023-05-05 MED ORDER — DICLOFENAC SODIUM 1 % EX GEL: 2.0000 g | Freq: Three times a day (TID) | CUTANEOUS | Status: DC | PRN

## 2023-05-05 MED ORDER — ACETAMINOPHEN 500 MG PO TABS
1000.0000 mg | ORAL_TABLET | ORAL | Status: AC
  Administered 2023-05-05: 1000 mg via ORAL
  Filled 2023-05-05: qty 2

## 2023-05-05 MED ORDER — ONDANSETRON HCL 4 MG/2ML IJ SOLN
INTRAMUSCULAR | Status: DC | PRN
  Administered 2023-05-05: 4 mg via INTRAVENOUS

## 2023-05-05 MED ORDER — INSULIN ASPART 100 UNIT/ML IJ SOLN
0.0000 [IU] | Freq: Three times a day (TID) | INTRAMUSCULAR | Status: DC
  Administered 2023-05-06: 1 [IU] via SUBCUTANEOUS

## 2023-05-05 SURGICAL SUPPLY — 87 items
ADH SKN CLS APL DERMABOND .7 (GAUZE/BANDAGES/DRESSINGS) ×2
AGENT HMST KT MTR STRL THRMB (HEMOSTASIS)
APL ESCP 34 STRL LF DISP (HEMOSTASIS)
APL SRG 38 LTWT LNG FL B (MISCELLANEOUS) ×1
APPLICATOR ARISTA FLEXITIP XL (MISCELLANEOUS) IMPLANT
APPLICATOR SURGIFLO ENDO (HEMOSTASIS) IMPLANT
BAG COUNTER SPONGE SURGICOUNT (BAG) IMPLANT
BAG LAPAROSCOPIC 12 15 PORT 16 (BASKET) IMPLANT
BAG RETRIEVAL 12/15 (BASKET) ×1
BAG SPNG CNTER NS LX DISP (BAG)
BLADE SURG SZ10 CARB STEEL (BLADE) IMPLANT
COVER BACK TABLE 60X90IN (DRAPES) ×1 IMPLANT
COVER TIP SHEARS 8 DVNC (MISCELLANEOUS) ×1 IMPLANT
DERMABOND ADVANCED .7 DNX12 (GAUZE/BANDAGES/DRESSINGS) ×1 IMPLANT
DRAPE ARM DVNC X/XI (DISPOSABLE) ×4 IMPLANT
DRAPE COLUMN DVNC XI (DISPOSABLE) ×1 IMPLANT
DRAPE SHEET LG 3/4 BI-LAMINATE (DRAPES) ×1 IMPLANT
DRAPE SURG IRRIG POUCH 19X23 (DRAPES) ×1 IMPLANT
DRIVER NDL MEGA 8 DVNC XI (INSTRUMENTS) ×1 IMPLANT
DRIVER NDLE MEGA DVNC XI (INSTRUMENTS) ×1 IMPLANT
DRSG OPSITE POSTOP 4X6 (GAUZE/BANDAGES/DRESSINGS) IMPLANT
DRSG OPSITE POSTOP 4X8 (GAUZE/BANDAGES/DRESSINGS) IMPLANT
ELECT PENCIL ROCKER SW 15FT (MISCELLANEOUS) IMPLANT
ELECT REM PT RETURN 15FT ADLT (MISCELLANEOUS) ×1 IMPLANT
FORCEPS BPLR FENES DVNC XI (FORCEP) ×1 IMPLANT
FORCEPS PROGRASP DVNC XI (FORCEP) ×1 IMPLANT
GAUZE 4X4 16PLY ~~LOC~~+RFID DBL (SPONGE) ×2 IMPLANT
GLOVE BIO SURGEON STRL SZ 6 (GLOVE) ×4 IMPLANT
GLOVE BIO SURGEON STRL SZ 6.5 (GLOVE) ×1 IMPLANT
GOWN STRL REUS W/ TWL LRG LVL3 (GOWN DISPOSABLE) ×4 IMPLANT
GOWN STRL REUS W/TWL LRG LVL3 (GOWN DISPOSABLE) ×6
GRASPER SUT TROCAR 14GX15 (MISCELLANEOUS) IMPLANT
HEMOSTAT ARISTA ABSORB 3G PWDR (HEMOSTASIS) IMPLANT
HOLDER FOLEY CATH W/STRAP (MISCELLANEOUS) IMPLANT
IRRIG SUCT STRYKERFLOW 2 WTIP (MISCELLANEOUS) ×1
IRRIGATION SUCT STRKRFLW 2 WTP (MISCELLANEOUS) ×1 IMPLANT
KIT PROCEDURE DVNC SI (MISCELLANEOUS) IMPLANT
KIT TURNOVER KIT A (KITS) IMPLANT
LIGASURE IMPACT 36 18CM CVD LR (INSTRUMENTS) IMPLANT
MANIPULATOR ADVINCU DEL 3.0 PL (MISCELLANEOUS) IMPLANT
MANIPULATOR ADVINCU DEL 3.5 PL (MISCELLANEOUS) IMPLANT
MANIPULATOR UTERINE 4.5 ZUMI (MISCELLANEOUS) IMPLANT
NDL HYPO 21X1.5 SAFETY (NEEDLE) ×1 IMPLANT
NDL SPNL 18GX3.5 QUINCKE PK (NEEDLE) IMPLANT
NEEDLE HYPO 21X1.5 SAFETY (NEEDLE) ×1 IMPLANT
NEEDLE SPNL 18GX3.5 QUINCKE PK (NEEDLE) ×1 IMPLANT
OBTURATOR OPTICAL STND 8 DVNC (TROCAR) ×1
OBTURATOR OPTICALSTD 8 DVNC (TROCAR) ×1 IMPLANT
OCCLUDER COLPOPNEUMO (BALLOONS) IMPLANT
PACK ROBOT GYN CUSTOM WL (TRAY / TRAY PROCEDURE) ×1 IMPLANT
PAD POSITIONING PINK XL (MISCELLANEOUS) ×1 IMPLANT
PORT ACCESS TROCAR AIRSEAL 12 (TROCAR) IMPLANT
SCISSORS MNPLR CVD DVNC XI (INSTRUMENTS) ×1 IMPLANT
SCRUB CHG 4% DYNA-HEX 4OZ (MISCELLANEOUS) IMPLANT
SEAL UNIV 5-12 XI (MISCELLANEOUS) ×4 IMPLANT
SET TRI-LUMEN FLTR TB AIRSEAL (TUBING) ×1 IMPLANT
SPIKE FLUID TRANSFER (MISCELLANEOUS) ×1 IMPLANT
SPONGE T-LAP 18X18 ~~LOC~~+RFID (SPONGE) IMPLANT
SURGIFLO W/THROMBIN 8M KIT (HEMOSTASIS) IMPLANT
SUT MNCRL AB 4-0 PS2 18 (SUTURE) IMPLANT
SUT PDS AB 1 CT1 27 (SUTURE) IMPLANT
SUT PDS AB 1 TP1 96 (SUTURE) IMPLANT
SUT V-LOC 180 0-0 GS22 (SUTURE) IMPLANT
SUT VIC AB 0 CT1 27 (SUTURE)
SUT VIC AB 0 CT1 27XBRD ANTBC (SUTURE) IMPLANT
SUT VIC AB 0 CT2 27 (SUTURE) IMPLANT
SUT VIC AB 2-0 CT1 27 (SUTURE)
SUT VIC AB 2-0 CT1 TAPERPNT 27 (SUTURE) IMPLANT
SUT VIC AB 2-0 SH 27 (SUTURE) ×1
SUT VIC AB 2-0 SH 27X BRD (SUTURE) IMPLANT
SUT VIC AB 4-0 PS2 18 (SUTURE) ×2 IMPLANT
SUT VICRYL 0 27 CT2 27 ABS (SUTURE) ×1 IMPLANT
SUT VLOC 180 0 9IN  GS21 (SUTURE) ×1
SUT VLOC 180 0 9IN GS21 (SUTURE) IMPLANT
SYR 10ML LL (SYRINGE) IMPLANT
SYR BULB IRRIG 60ML STRL (SYRINGE) IMPLANT
SYS BAG RETRIEVAL 10MM (BASKET)
SYS WOUND ALEXIS 18CM MED (MISCELLANEOUS)
SYSTEM BAG RETRIEVAL 10MM (BASKET) IMPLANT
SYSTEM WOUND ALEXIS 18CM MED (MISCELLANEOUS) IMPLANT
TOWEL OR NON WOVEN STRL DISP B (DISPOSABLE) IMPLANT
TRAP SPECIMEN MUCUS 40CC (MISCELLANEOUS) IMPLANT
TRAY FOLEY MTR SLVR 16FR STAT (SET/KITS/TRAYS/PACK) ×1 IMPLANT
TROCAR PORT AIRSEAL 5X120 (TROCAR) IMPLANT
UNDERPAD 30X36 HEAVY ABSORB (UNDERPADS AND DIAPERS) ×2 IMPLANT
WATER STERILE IRR 1000ML POUR (IV SOLUTION) ×1 IMPLANT
YANKAUER SUCT BULB TIP 10FT TU (MISCELLANEOUS) IMPLANT

## 2023-05-05 NOTE — Interval H&P Note (Signed)
History and Physical Interval Note:  05/05/2023 2:45 PM  Yvonne [name]  has presented today for surgery, with the diagnosis of ENDOMETRIAL CANCER.  The various methods of treatment have been discussed with the patient and family. After consideration of risks, benefits and other options for treatment, the patient has consented to  Procedure(s): XI ROBOTIC ASSISTED TOTAL HYSTERECTOMY WITH BILATERAL SALPINGO OOPHORECTOMY, POSSIBLE LAPAROTOMY (N/A) SENTINEL NODE BIOPSY (N/A) POSSIBLE LYMPH NODE DISSECTION (N/A) as a surgical intervention.  The patient's history has been reviewed, patient examined, no change in status, stable for surgery.  I have reviewed the patient's chart and labs.  Questions were answered to the patient's satisfaction.     Carver Fila

## 2023-05-05 NOTE — Anesthesia Procedure Notes (Signed)
Procedure Name: Intubation Date/Time: 05/05/2023 3:29 PM  Performed by: Ponciano Ort, CRNAPre-anesthesia Checklist: Patient identified, Emergency Drugs available, Suction available and Patient being monitored Patient Re-evaluated:Patient Re-evaluated prior to induction Oxygen Delivery Method: Circle system utilized Preoxygenation: Pre-oxygenation with 100% oxygen Induction Type: IV induction Ventilation: Mask ventilation without difficulty Laryngoscope Size: Mac and 3 Grade View: Grade I Tube type: Oral Tube size: 7.0 mm Number of attempts: 1 Airway Equipment and Method: Stylet and Oral airway Placement Confirmation: ETT inserted through vocal cords under direct vision, positive ETCO2 and breath sounds checked- equal and bilateral Secured at: 21 cm Tube secured with: Tape Dental Injury: Teeth and Oropharynx as per pre-operative assessment

## 2023-05-05 NOTE — Progress Notes (Signed)
Started having nausea with vomiting clear phlegm since arriving to the hospital approximately 75-100cc.  She drank Gatorade this am.

## 2023-05-05 NOTE — Anesthesia Postprocedure Evaluation (Signed)
Anesthesia Post Note  Patient: FREDERICA MAPLE  Procedure(s) Performed: XI ROBOTIC ASSISTED TOTAL HYSTERECTOMY WITH BILATERAL SALPINGO OOPHORECTOMY SENTINEL NODE BIOPSY HERNIA REPAIR UMBILICAL ADULT     Patient location during evaluation: PACU Anesthesia Type: General Level of consciousness: awake Pain management: pain level controlled Vital Signs Assessment: post-procedure vital signs reviewed and stable Respiratory status: spontaneous breathing, nonlabored ventilation and respiratory function stable Cardiovascular status: blood pressure returned to baseline and stable Postop Assessment: no apparent nausea or vomiting Anesthetic complications: no   No notable events documented.  Last Vitals:  Vitals:   05/05/23 1945 05/05/23 2033  BP: (!) 156/78 (!) 158/69  Pulse: (!) 56 (!) 57  Resp: 18 18  Temp: 36.6 C 36.5 C  SpO2: 100% 99%    Last Pain:  Vitals:   05/05/23 2033  TempSrc: Oral  PainSc:                  Catheryn Bacon Facundo Allemand

## 2023-05-05 NOTE — Transfer of Care (Signed)
Immediate Anesthesia Transfer of Care Note  Patient: Yvonne [name]  Procedure(s) Performed: XI ROBOTIC ASSISTED TOTAL HYSTERECTOMY WITH BILATERAL SALPINGO OOPHORECTOMY SENTINEL NODE BIOPSY HERNIA REPAIR UMBILICAL ADULT  Patient Location: PACU  Anesthesia Type:General  Level of Consciousness: awake, alert , oriented, and patient cooperative  Airway & Oxygen Therapy: Patient Spontanous Breathing and Patient connected to face mask oxygen  Post-op Assessment: Report given to RN and Post -op Vital signs reviewed and stable  Post vital signs: Reviewed and stable  Last Vitals:  Vitals Value Taken Time  BP 153/81 05/05/23 1833  Temp    Pulse 60 05/05/23 1835  Resp 16 05/05/23 1835  SpO2 100 % 05/05/23 1835  Vitals shown include unvalidated device data.  Last Pain:  Vitals:   05/05/23 1319  TempSrc:   PainSc: 0-No pain         Complications: No notable events documented.

## 2023-05-05 NOTE — Discharge Instructions (Addendum)
AFTER SURGERY INSTRUCTIONS   Return to work: 4-6 weeks if applicable   After surgery, we will start you on the prophylactic dosing of Eliquis (2.5 mg twice daily) for at least one week. We will defer to your PCP about increasing the dose after that if needed.  PLAN TO START TAKING THE BLOOD THINNER ELIQUIS ON SUNDAY MAY 19. MONITOR FOR BLEEDING AND CALL THE OFFICE RIGHT AWAY.   Activity: 1. Be up and out of the bed during the day.  Take a nap if needed.  You may walk up steps but be careful and use the hand rail.  Stair climbing will tire you more than you think, you may need to stop part way and rest.    2. No lifting or straining for 6 weeks over 10 pounds. No pushing, pulling, straining for 6 weeks.   3. No driving for around 1 week(s).  Do not drive if you are taking narcotic pain medicine and make sure that your reaction time has returned.    4. You can shower as soon as the next day after surgery. Shower daily.  Use your regular soap and water (not directly on the incision) and pat your incision(s) dry afterwards; don't rub.  No tub baths or submerging your body in water until cleared by your surgeon. If you have the soap that was given to you by pre-surgical testing that was used before surgery, you do not need to use it afterwards because this can irritate your incisions.    5. No sexual activity and nothing in the vagina for 10-12 weeks.   6. You may experience a small amount of clear drainage from your incisions, which is normal.  If the drainage persists, increases, or changes color please call the office.   7. Do not use creams, lotions, or ointments such as neosporin on your incisions after surgery until advised by your surgeon because they can cause removal of the dermabond glue on your incisions.     8. You may experience vaginal spotting after surgery or around the 6-8 week mark from surgery when the stitches at the top of the vagina begin to dissolve.  The spotting is normal  but if you experience heavy bleeding, call our office.   9. Take Tylenol first for pain if you are able to take these medication and only use narcotic pain medication for severe pain not relieved by the Tylenol.  Monitor your Tylenol intake to a max of 4,000 mg in a 24 hour period.    Diet: 1. Low sodium Heart Healthy Diet is recommended but you are cleared to resume your normal (before surgery) diet after your procedure.   2. It is safe to use a laxative, such as Miralax or Colace, if you have difficulty moving your bowels. You have been prescribed Sennakot-S to take at bedtime every evening after surgery to keep bowel movements regular and to prevent constipation.     Wound Care: 1. Keep clean and dry.  Shower daily.   Reasons to call the Doctor: Fever - Oral temperature greater than 100.4 degrees Fahrenheit Foul-smelling vaginal discharge Difficulty urinating Nausea and vomiting Increased pain at the site of the incision that is unrelieved with pain medicine. Difficulty breathing with or without chest pain New calf pain especially if only on one side Sudden, continuing increased vaginal bleeding with or without clots.   Contacts: For questions or concerns you should contact:   Dr. Eugene Garnet at [phone]   [facility],  NP at [phone]   After Hours: call [phone] and have the GYN Oncologist paged/contacted (after 5 pm or on the weekends). You will speak with an after hours RN and let he or she know you have had surgery.   Messages sent via mychart are for non-urgent matters and are not responded to after hours so for urgent needs, please call the after hours number.

## 2023-05-05 NOTE — Op Note (Signed)
OPERATIVE NOTE  Pre-operative Diagnosis: endometrial cancer grade 2  Post-operative Diagnosis: same, umbilical hernia  Operation: Robotic-assisted laparoscopic total hysterectomy with bilateral salpingoophorectomy, SLN biopsy, umbilical hernia repair   Surgeon: Eugene Garnet MD  Assistant Surgeon: Warner Mccreedy NP  Anesthesia: GET  Urine Output: 100 cc  Operative Findings: On EUA, mobile uterus. Cervix dilated and somewhat enlarged but not firm. On intra-abdominal entry, normal upper abdominal survey with some adhesions noted between the liver and anterior abdominal wall near the gallbladder. Normal omentum although 1 cm umbilical hernia noted to contain omentum initially (reduced on its own). Normal small and large bowel. Normal appearing bilateral adnexa. Uterus 8-10 cm and bulbous, serosa normal in appearance. Mapping successful to right presacral SLN and left external iliac SLN. No evidence of peritoneal disease.  Estimated Blood Loss:  100 cc      Total IV Fluids: see I&O flowsheet         Specimens: uterus, cervix, bilateral tubes and ovaries, right presacral SLN, left external iliac SLN         Complications:  None apparent; patient tolerated the procedure well.         Disposition: PACU - hemodynamically stable.  Procedure Details  The patient was seen in the Holding Room. The risks, benefits, complications, treatment options, and expected outcomes were discussed with the patient.  The patient concurred with the proposed plan, giving informed consent.  The site of surgery properly noted/marked. The patient was identified as Yvonne [name] and the procedure verified as a Robotic-assisted hysterectomy with bilateral salpingo oophorectomy with SLN biopsy.   After induction of anesthesia, the patient was draped and prepped in the usual sterile manner. Patient was placed in supine position after anesthesia and draped and prepped in the usual sterile manner as follows: Her  arms were tucked to her side with all appropriate precautions.  The patient was secured to the bed using padding and tape across her chest.  The patient was placed in the semi-lithotomy position in [place] stirrups.  The perineum and vagina were prepped with CHG. The patient's abdomen was prepped with ChloraPrep and she was draped after the prep had been allowed to dry for 3 minutes.  A Time Out was held and the above information confirmed.  The urethra was prepped with Betadine. Foley catheter was placed.  A sterile speculum was placed in the vagina.  The cervix was grasped with a single-tooth tenaculum. 2mg  total of ICG was injected into the cervical stroma at 2 and 9 o'clock with 1cc injected at a 1cm and 2mm depth (concentration 0.5mg /ml) in all locations. The cervix was dilated with Shawnie Pons dilators.  The ZUMI uterine manipulator with a medium colpotomizer ring was placed without difficulty.  A pneum occluder balloon was placed over the manipulator.  OG tube placement was confirmed and to suction.   Next, a 10 mm skin incision was made 1 cm below the subcostal margin in the midclavicular line.  The 5 mm Optiview port and scope was used for direct entry.  Opening pressure was under 10 mm CO2.  The abdomen was insufflated and the findings were noted as above.   At this point and all points during the procedure, the patient's intra-abdominal pressure did not exceed 15 mmHg. Next, an 8 mm skin incision was made superior to the umbilicus and a right and left port were placed about 8 cm lateral to the robot port on the right and left side.  A fourth arm was placed on  the right.  The 5 mm assist trocar was exchanged for a 10-12 mm port. All ports were placed under direct visualization.  The patient was placed in steep Trendelenburg.  Bowel was folded away into the upper abdomen.  The robot was docked in the normal manner.  The right and left peritoneum were opened parallel to the IP ligament to open the  retroperitoneal spaces bilaterally. The round ligaments were transected. The SLN mapping was performed in bilateral pelvic basins. After identifying the ureters, the para rectal and paravesical spaces were opened up entirely with careful dissection below the level of the ureters bilaterally and to the depth of the uterine artery origin in order to skeletonize the uterine "web" and ensure visualization of all parametrial channels. The para-aortic basins were carefully exposed and evaluated for isolated para-aortic SLN's. Lymphatic channels were identified travelling to the following visualized sentinel lymph node's: right presacral and left external iliac. These SLN's were separated from their surrounding lymphatic tissue, removed and sent for permanent pathology.  The hysterectomy was started.  The ureter was again noted to be on the medial leaf of the broad ligament.  The peritoneum above the ureter was incised and stretched and the infundibulopelvic ligament was skeletonized, cauterized and cut.  The posterior peritoneum was taken down to the level of the KOH ring.  The anterior peritoneum was also taken down.  The bladder flap was created to the level of the KOH ring.  The uterine artery on the right side was skeletonized, cauterized and cut in the normal manner.  A similar procedure was performed on the left.  The colpotomy was made and the uterus, cervix, bilateral ovaries and tubes were amputated and delivered through the vagina.  Pedicles were inspected and excellent hemostasis was achieved.    The colpotomy at the vaginal cuff was closed with 0 Vicryl with a figure of eight at each apex and 0 V-Loc to close the midportion of the cuff in a running manner.  Irrigation was used and excellent hemostasis was achieved.  At this point in the procedure was completed.  Robotic instruments were removed under direct visulaization.  The robot was undocked. The fascia at the 10-12 mm port was closed with 0 Vicryl  using a PMI fascial closure device.    The supraumbilical incision was extended around the umbilicus and dissected down to the umbilical hernia. The hernia sac was excised with monopolar electrocautery. The fascia was identified and developed around the incision. The incision was closed with 0 PDS. The subcutaneous tissue was irrigated and 2-0 Vicryl was used to close the deep subcutaneous tissue and tack the umbilicus down.   The subcuticular tissue of all incisions was closed with 4-0 Vicryl and the skin was closed with 4-0 Monocryl in a subcuticular manner.  Dermabond was applied.    The vagina was swabbed with some bleeding noted. On speculum exam, some bleeding was noted from the vaginal mucosa at the midportion of the cuff. Two figure of eight stitches were placed with 0 Vicryl with good hemostasis noted. The vagina was irrigated with no bleeding noted. Foley catheter was left in place.  All sponge, lap and needle counts were correct x  3.   The patient was transferred to the recovery room in stable condition.  Eugene Garnet, MD

## 2023-05-06 ENCOUNTER — Encounter (HOSPITAL_COMMUNITY): Payer: Self-pay | Admitting: Gynecologic Oncology

## 2023-05-06 ENCOUNTER — Telehealth: Payer: Self-pay | Admitting: *Deleted

## 2023-05-06 DIAGNOSIS — C541 Malignant neoplasm of endometrium: Secondary | ICD-10-CM | POA: Diagnosis not present

## 2023-05-06 LAB — CBC
HCT: 29.9 % — ABNORMAL LOW (ref 36.0–46.0)
Hemoglobin: 9.7 g/dL — ABNORMAL LOW (ref 12.0–15.0)
MCH: 28.8 pg (ref 26.0–34.0)
MCHC: 32.4 g/dL (ref 30.0–36.0)
MCV: 88.7 fL (ref 80.0–100.0)
Platelets: 238 10*3/uL (ref 150–400)
RBC: 3.37 MIL/uL — ABNORMAL LOW (ref 3.87–5.11)
RDW: 15.9 % — ABNORMAL HIGH (ref 11.5–15.5)
WBC: 14.3 10*3/uL — ABNORMAL HIGH (ref 4.0–10.5)
nRBC: 0 % (ref 0.0–0.2)

## 2023-05-06 LAB — BASIC METABOLIC PANEL
Anion gap: 8 (ref 5–15)
BUN: 14 mg/dL (ref 8–23)
CO2: 25 mmol/L (ref 22–32)
Calcium: 8.5 mg/dL — ABNORMAL LOW (ref 8.9–10.3)
Chloride: 98 mmol/L (ref 98–111)
Creatinine, Ser: 0.85 mg/dL (ref 0.44–1.00)
GFR, Estimated: >60 mL/min (ref >60)
Glucose, Bld: 149 mg/dL — ABNORMAL HIGH (ref 70–99)
Potassium: 3.9 mmol/L (ref 3.5–5.1)
Sodium: 131 mmol/L — ABNORMAL LOW (ref 135–145)

## 2023-05-06 LAB — GLUCOSE, CAPILLARY: Glucose-Capillary: 129 mg/dL — ABNORMAL HIGH (ref 70–99)

## 2023-05-06 MED ORDER — APIXABAN 2.5 MG PO TABS
2.5000 mg | ORAL_TABLET | Freq: Two times a day (BID) | ORAL | 0 refills | Status: DC
Start: 2023-05-09 — End: 2023-08-11

## 2023-05-06 NOTE — Progress Notes (Signed)
  Transition of Care [facility]) Screening Note   Patient Details  Name: Yvonne [name] Date of Birth: 05-30-1946   Transition of Care [facility]) CM/SW Contact:    Adrian Prows, RN Phone Number: 05/06/2023, 8:48 AM    Transition of Care Department [facility]) has reviewed patient and no TOC needs have been identified at this time. We will continue to monitor patient advancement through interdisciplinary progression rounds. If new patient transition needs arise, please place a TOC consult.

## 2023-05-06 NOTE — Plan of Care (Signed)
  Problem: Education: Goal: Ability to describe self-care measures that may prevent or decrease complications (Diabetes Survival Skills Education) will improve Outcome: Adequate for Discharge Goal: Individualized Educational Video(s) Outcome: Adequate for Discharge   Problem: Coping: Goal: Ability to adjust to condition or change in health will improve Outcome: Adequate for Discharge   Problem: Fluid Volume: Goal: Ability to maintain a balanced intake and output will improve Outcome: Adequate for Discharge   Problem: Health Behavior/Discharge Planning: Goal: Ability to identify and utilize available resources and services will improve Outcome: Adequate for Discharge Goal: Ability to manage health-related needs will improve Outcome: Adequate for Discharge   Problem: Metabolic: Goal: Ability to maintain appropriate glucose levels will improve Outcome: Adequate for Discharge   Problem: Nutritional: Goal: Maintenance of adequate nutrition will improve Outcome: Adequate for Discharge Goal: Progress toward achieving an optimal weight will improve Outcome: Adequate for Discharge   Problem: Skin Integrity: Goal: Risk for impaired skin integrity will decrease Outcome: Adequate for Discharge   Problem: Tissue Perfusion: Goal: Adequacy of tissue perfusion will improve Outcome: Adequate for Discharge   Problem: Education: Goal: Knowledge of the prescribed therapeutic regimen will improve Outcome: Adequate for Discharge Goal: Understanding of sexual limitations or changes related to disease process or condition will improve Outcome: Adequate for Discharge Goal: Individualized Educational Video(s) Outcome: Adequate for Discharge   Problem: Self-Concept: Goal: Communication of feelings regarding changes in body function or appearance will improve Outcome: Adequate for Discharge   Problem: Skin Integrity: Goal: Demonstration of wound healing without infection will improve Outcome:  Adequate for Discharge   Problem: Education: Goal: Knowledge of General Education information will improve Description: Including pain rating scale, medication(s)/side effects and non-pharmacologic comfort measures Outcome: Adequate for Discharge   Problem: Health Behavior/Discharge Planning: Goal: Ability to manage health-related needs will improve Outcome: Adequate for Discharge   Problem: Clinical Measurements: Goal: Ability to maintain clinical measurements within normal limits will improve Outcome: Adequate for Discharge Goal: Will remain free from infection Outcome: Adequate for Discharge Goal: Diagnostic test results will improve Outcome: Adequate for Discharge Goal: Respiratory complications will improve Outcome: Adequate for Discharge Goal: Cardiovascular complication will be avoided Outcome: Adequate for Discharge   Problem: Activity: Goal: Risk for activity intolerance will decrease Outcome: Adequate for Discharge   Problem: Nutrition: Goal: Adequate nutrition will be maintained Outcome: Adequate for Discharge   Problem: Coping: Goal: Level of anxiety will decrease Outcome: Adequate for Discharge   Problem: Elimination: Goal: Will not experience complications related to bowel motility Outcome: Adequate for Discharge Goal: Will not experience complications related to urinary retention Outcome: Adequate for Discharge   Problem: Pain Managment: Goal: General experience of comfort will improve Outcome: Adequate for Discharge   Problem: Safety: Goal: Ability to remain free from injury will improve Outcome: Adequate for Discharge   Problem: Skin Integrity: Goal: Risk for impaired skin integrity will decrease Outcome: Adequate for Discharge   

## 2023-05-06 NOTE — Telephone Encounter (Signed)
Spoke with Yvonne [name] this morning. She states she is eating, drinking and urinating well. She has not had a BM yet but is passing gas. She is taking senokot as prescribed and encouraged her to drink plenty of water. She denies fever or chills. Incisions are dry and intact. She rates her pain 8/10. Her pain is controlled with Ultram and pain score goes down to 1/10.    Instructed to call office with any fever, chills, purulent drainage, uncontrolled pain or any other questions or concerns. Patient verbalizes understanding.   Pt aware of post op appointments as well as the office number [phone] and after hours number [phone] to call if she has any questions or concerns

## 2023-05-06 NOTE — Progress Notes (Signed)
1 Day Post-Op Procedure(s) (LRB): XI ROBOTIC ASSISTED TOTAL HYSTERECTOMY WITH BILATERAL SALPINGO OOPHORECTOMY (N/A) SENTINEL NODE BIOPSY (N/A) HERNIA REPAIR UMBILICAL ADULT (N/A)  Subjective: Patient reports she has not eaten solid food. She is tolerating sipping on water with no nausea or emesis. Ambulated in the halls this am and tolerated this well. No flatus. Minimal abdominal pain reported. No concerns voiced.  Objective: Vital signs in last 24 hours: Temp:  [97.5 F (36.4 C)-98.4 F (36.9 C)] 98.4 F (36.9 C) (05/16 0815) Pulse Rate:  [55-67] 55 (05/16 0815) Resp:  [14-20] 17 (05/16 0554) BP: (123-161)/(44-81) 123/44 (05/16 0815) SpO2:  [96 %-100 %] 99 % (05/16 0815) Last BM Date : 05/05/23  Intake/Output from previous day: 05/15 0701 - 05/16 0700 In: 2221.4 [P.O.:600; I.V.:1521.4; IV Piggyback:100] Out: 1900 [Urine:1800; Blood:100]  Physical Examination: General: alert, cooperative, no distress, and HOH, hearing aids in place Resp: clear to auscultation bilaterally Cardio: regular rate and rhythm, S1, S2 normal, no murmur, click, rub or gallop GI: incision: lap sites to the abdomen with dermabond intact with no drainage or erythema and abdomen soft, mildly hypoactive bowel sounds Extremities: extremities normal, atraumatic, no cyanosis or edema  Labs: WBC/Hgb/Hct/Plts:  14.3/9.7/29.9/238 (05/16 0444) BUN/Cr/glu/ALT/AST/amyl/lip:  14/0.85/--/--/--/--/-- (05/16 0444)  Assessment: 77 y.o. s/p Procedure(s): XI ROBOTIC ASSISTED TOTAL HYSTERECTOMY WITH BILATERAL SALPINGO OOPHORECTOMY, SENTINEL NODE BIOPSY, HERNIA REPAIR UMBILICAL ADULT: stable Pain:  Pain is well-controlled on PRN medications.  Heme: Hgb 9.7 and Hct 29.9 this am. Appropriate given preop values, surgical losses, hemodilution.   ID: WBC 14.3 this am. Given decadron intra-op along with IV antibiotic. No evidence of infection.  CV: BP and HR stable. Continue to monitor prior to discharge.  GI:  Tolerating  po: Yes. Diet as tolerated.  GU: Creatinine 0.85 this am. Plans for foley removal this am.    FEN: No critical values this am.  Endo: Diabetes mellitus Type II, under good control.  CBG: CBG (last 3)  Recent Labs    05/05/23 1837 05/05/23 2235 05/06/23 0712  GLUCAP 136* 150* 129*    Prophylaxis: SCDs and lovenox  Plan: Foley to be removed this am IV to saline lock Diet as tolerated If meeting milestones later this am (voiding, tolerating diet, pain controlled, ambulating), can be discharged home. She is discharged home on prophylactic Eliquis which she is supposed to start on Sunday, May 19 for a total of 2 weeks.   LOS: 0 days    Doylene Bode 05/06/2023, 3:24 PM

## 2023-05-06 NOTE — Progress Notes (Signed)
Patient ambulated 40 ft in hall with front wheel walker, tolerated well. On room air.

## 2023-05-06 NOTE — Discharge Summary (Signed)
Physician Discharge Summary  Patient ID: Yvonne [name] MRN: [id number] DOB/AGE: [birth year] 77 y.o.  Admit date: 05/05/2023 Discharge date: 05/06/2023  Admission Diagnoses: Endometrial cancer [facility])  Discharge Diagnoses:  Principal Problem:   Endometrial cancer (HCC) Active Problems:   Umbilical hernia without obstruction and without gangrene   Discharged Condition:  The patient is in good condition and stable for discharge.    Hospital Course: On 05/05/2023, the patient underwent the following: Procedure(s): XI ROBOTIC ASSISTED TOTAL HYSTERECTOMY WITH BILATERAL SALPINGO OOPHORECTOMY, SENTINEL NODE BIOPSY, HERNIA REPAIR UMBILICAL ADULT. The postoperative course was uneventful.  She was discharged to home on postoperative day 1 tolerating a regular diet, voiding, ambulating, pain controlled.   Consults: None  Significant Diagnostic Studies: AM labs  Treatments: surgery: see above  Discharge Exam (am assessment by [name]): Blood pressure (!) 123/44, pulse (!) 55, temperature 98.4 F (36.9 C), temperature source Oral, resp. rate 17, height 5' 4.96" (1.65 m), weight 186 lb 15.2 oz (84.8 kg), SpO2 99 %. General: alert, cooperative, no distress, and HOH, hearing aids in place Resp: clear to auscultation bilaterally Cardio: regular rate and rhythm, S1, S2 normal, no murmur, click, rub or gallop GI: incision: lap sites to the abdomen with dermabond intact with no drainage or erythema and abdomen soft, mildly hypoactive bowel sounds Extremities: extremities normal, atraumatic, no cyanosis or edema  Disposition: Discharge disposition: 01-Home or Self Care       Discharge Instructions     Call MD for:  difficulty breathing, headache or visual disturbances   Complete by: As directed    Call MD for:  extreme fatigue   Complete by: As directed    Call MD for:  hives   Complete by: As directed    Call MD for:  persistant dizziness or light-headedness   Complete by: As  directed    Call MD for:  persistant nausea and vomiting   Complete by: As directed    Call MD for:  redness, tenderness, or signs of infection (pain, swelling, redness, odor or green/yellow discharge around incision site)   Complete by: As directed    Call MD for:  severe uncontrolled pain   Complete by: As directed    Call MD for:  temperature >100.4   Complete by: As directed    Diet - low sodium heart healthy   Complete by: As directed    Driving Restrictions   Complete by: As directed    No driving for 1 week(s).  Do not take narcotics and drive. You need to make sure your reaction time has returned.   Increase activity slowly   Complete by: As directed    Lifting restrictions   Complete by: As directed    No lifting greater than 10 lbs, pushing, pulling, straining for 6 weeks.   Sexual Activity Restrictions   Complete by: As directed    No sexual activity, nothing in the vagina, for 10-12 weeks.      Allergies as of 05/06/2023       Reactions   Penicillins Anaphylaxis   Has taken Keflex without issue (Dec 2023)        Medication List     STOP taking these medications    medroxyPROGESTERone 10 MG tablet Commonly known as: Provera   potassium chloride SA 20 MEQ tablet Commonly known as: KLOR-CON M       TAKE these medications    amLODipine 5 MG tablet Commonly known as: NORVASC Take 1 tablet (5 mg  total) by mouth daily.   apixaban 2.5 MG Tabs tablet Commonly known as: Eliquis Take 1 tablet (2.5 mg total) by mouth 2 (two) times daily. FOR AFTER SURGERY ONLY Start taking on: May 09, 2023 What changed: These instructions start on May 09, 2023. If you are unsure what to do until then, ask your doctor or other care provider.   [employer] Glucose Meter Test Strips test strip Generic drug: glucose blood Use as instructed   diclofenac Sodium 1 % Gel Commonly known as: VOLTAREN Apply 2 g topically every 8 (eight) hours as needed (apply to knee).   freestyle  lancets 1 each by Other route daily.   gabapentin 300 MG capsule Commonly known as: NEURONTIN Take 3 capsules (900 mg total) by mouth at bedtime.   metFORMIN 500 MG tablet Commonly known as: GLUCOPHAGE Take 0.5 tablets (250 mg total) by mouth 2 (two) times daily with a meal.   rosuvastatin 40 MG tablet Commonly known as: CRESTOR Take 1 tablet (40 mg total) by mouth daily.   senna-docusate 8.6-50 MG tablet Commonly known as: Senokot-S Take 2 tablets by mouth at bedtime. For AFTER surgery, do not take if having diarrhea   traMADol 50 MG tablet Commonly known as: ULTRAM Take 1 tablet (50 mg total) by mouth every 6 (six) hours as needed for severe pain. For AFTER surgery only, do not take and drive        Follow-up Information     Carver Fila, MD Follow up on 05/12/2023.   Specialty: Gynecologic Oncology Why: around 4:20pm will be a PHONE call with [name] to check in and discuss pathology. IN PERSON visit will be on 05/28/2023 at 3pm at the [facility] for post-op check Contact information: [address] [place] [address] [phone]                 Greater than thirty minutes were spend for face to face discharge instructions and discharge orders/summary in EPIC.   Signed: Doylene Bode 05/06/2023, 3:32 PM

## 2023-05-07 ENCOUNTER — Telehealth: Payer: Self-pay | Admitting: *Deleted

## 2023-05-07 ENCOUNTER — Ambulatory Visit (INDEPENDENT_AMBULATORY_CARE_PROVIDER_SITE_OTHER): Payer: Medicare Other | Admitting: Student

## 2023-05-07 DIAGNOSIS — Z79899 Other long term (current) drug therapy: Secondary | ICD-10-CM

## 2023-05-07 DIAGNOSIS — Z86718 Personal history of other venous thrombosis and embolism: Secondary | ICD-10-CM

## 2023-05-07 LAB — URINE CULTURE: Culture: NO GROWTH

## 2023-05-07 NOTE — Telephone Encounter (Signed)
Spoke with Ms. Kotas in regards to message from Warner Mccreedy, NP see attached. Pt receptive to the medication instructions and was aware to start the elequis on Sunday, May 19th. .  Pt states she has a follow up appointment with her PCP on June 10th.

## 2023-05-07 NOTE — Progress Notes (Signed)
   CC: medication concern  This is a telephone encounter between [name] and Rana Snare on 05/07/2023 for medication concern. The visit was conducted with the patient located at home and Rana Snare at [facility]. The patient's identity was confirmed using their DOB and current address. The patient has consented to being evaluated through a telephone encounter and understands the associated risks (an examination cannot be done and the patient may need to come in for an appointment) / benefits (allows the patient to remain at home, decreasing exposure to coronavirus). I personally spent 7 minutes on medical discussion.   HPI:  [name] is a 77 y.o. with PMH as below.   Please see A&P for assessment of the patient's acute and chronic medical conditions.   Past Medical History:  Diagnosis Date   Acute deep vein thrombosis (DVT) of popliteal vein of right lower extremity (HCC) 09/12/2020   AKI (acute kidney injury) (HCC) 02/09/2013   Arthritis    Breast cancer (HCC) 04/2012   right breast   Bruises easily    Cancer (HCC) 03/25/2012   right breast   Chronic back pain    arthritis   Diabetes mellitus without complication (HCC)    Elevated cholesterol    takes Crestor daily   Hemorrhoids    History of chemotherapy    x 1, pt unable to tolerate, on PO chemotherpay   Hypertension    takes Maxzide daily    Phlebitis 49yrs ago   hx of-   Seasonal allergies    takes Zyrtec daily and Nasal Spray prn    Assessment & Plan:   No problem-specific Assessment & Plan notes found for this encounter.  Telehealth visit today to discuss medication concern. Plan is to start on Eliquis 2.5 mg BID x 2 weeks for postop DVT prophylaxis on 5/19 after her total hysterectomy with b/l salpingo oophorectomy on 5/15 for endometrial adenocarcinoma. She was unclear about the Eliquis and states she did not receive the medication. Clarified that Rx for Eliquis was sent to her pharmacy prior to  her procedure date. Discussed with patient to continue f/u with our office and Gyn Onc regarding further anticoagulation after 2 weeks of Eliquis. Patient verbalizes understanding.  Patient discussed with Dr. Rollene Fare, DO 05/07/23

## 2023-05-07 NOTE — Telephone Encounter (Signed)
-----   Message from Doylene Bode, NP sent at 05/07/2023 12:05 PM EDT ----- Please make sure she has follow up with her PCP. We sent her home with prophylatic Eliquis to take for two weeks. Her PCP may want to increase dosing after 2 weeks post-op based on her hx.

## 2023-05-07 NOTE — Telephone Encounter (Signed)
Patient was called back and asked to make an appt. With her PCP before June 3rd. When her Rx. Of eliquis runs out. Pt receptive and states she will call her PCP on Monday morning b/c the office is now closed.

## 2023-05-11 ENCOUNTER — Other Ambulatory Visit: Payer: Self-pay | Admitting: Gynecologic Oncology

## 2023-05-11 ENCOUNTER — Telehealth: Payer: Self-pay | Admitting: *Deleted

## 2023-05-11 DIAGNOSIS — C541 Malignant neoplasm of endometrium: Secondary | ICD-10-CM

## 2023-05-11 LAB — SURGICAL PATHOLOGY

## 2023-05-11 MED ORDER — TRAMADOL HCL 50 MG PO TABS
50.0000 mg | ORAL_TABLET | Freq: Four times a day (QID) | ORAL | 0 refills | Status: DC | PRN
Start: 2023-05-11 — End: 2023-08-11

## 2023-05-11 NOTE — Telephone Encounter (Signed)
Yvonne [name] was called to follow up from Warner Mccreedy, NP note.  Patient states she received a Rx. For her eliquis from her PCP so she doesn't run out before her follow up appt. With him on June 10 th. She states, "I am taking my blood thinner as prescribed"     Pt states she is only using the tramadol when she needs it for a pain score of 6-7/10. She is taking it at night before she goes to sleep because it helps her relax, "I sleep better when I take it before bed" Patient also states she has only 3 tablets left and was asking if [name] would Prescribe more. Pt advised to speak with [name] tomorrow at her phone appointment with her at 4:20.

## 2023-05-11 NOTE — Progress Notes (Signed)
See RN note. Refill sent in for tramadol for post-op pain

## 2023-05-11 NOTE — Progress Notes (Signed)
Internal Medicine Clinic Attending  Case discussed with Dr. Zheng  At the time of the visit.  We reviewed the resident's history and exam and pertinent patient test results.  I agree with the assessment, diagnosis, and plan of care documented in the resident's note.  

## 2023-05-12 ENCOUNTER — Inpatient Hospital Stay (HOSPITAL_BASED_OUTPATIENT_CLINIC_OR_DEPARTMENT_OTHER): Payer: Medicare Other | Admitting: Gynecologic Oncology

## 2023-05-12 ENCOUNTER — Encounter: Payer: Self-pay | Admitting: Gynecologic Oncology

## 2023-05-12 ENCOUNTER — Telehealth: Payer: Self-pay | Admitting: Oncology

## 2023-05-12 DIAGNOSIS — C541 Malignant neoplasm of endometrium: Secondary | ICD-10-CM

## 2023-05-12 DIAGNOSIS — Z90710 Acquired absence of both cervix and uterus: Secondary | ICD-10-CM

## 2023-05-12 DIAGNOSIS — Z7189 Other specified counseling: Secondary | ICD-10-CM

## 2023-05-12 DIAGNOSIS — Z90722 Acquired absence of ovaries, bilateral: Secondary | ICD-10-CM

## 2023-05-12 NOTE — Progress Notes (Signed)
Gynecologic Oncology Telehealth Note: Gyn-Onc  I connected with [name] on 05/12/23 at  4:20 PM EDT by telephone and verified that I am speaking with the correct person using two identifiers.  I discussed the limitations, risks, security and privacy concerns of performing an evaluation and management service by telemedicine and the availability of in-person appointments. I also discussed with the patient that there may be a patient responsible charge related to this service. The patient expressed understanding and agreed to proceed.  Other persons participating in the visit and their role in the encounter: none.  Patient's location: home Provider's location: [facility]  Reason for Visit: follow-up after surgery  Treatment History: She presented to the ED with vaginal bleeding and hematochezia after recently starting Xarelto. She was seen for outpatient follow-up by OBGYN after pelvic ultrasound during her short hospitalization showed a uterus measuring 9 x 5 x 6 cm with a large central 6.2 cm endometrial vs myometrial mass. 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 The patient was started on 10 mg Provera at her follow-up visit on 5/6.    She has a history of breast cancer, provoked DVT (and possible a PE at the time of a pregnancy) and an unprovoked DVT in 2021. Work up revealed positive lupus anticoagulant.  She had initially been on Eliquis, was transition to Xarelto.  She was on prophylactic dosing until recently around the time of her vaginal bleeding.  Lower extremity Doppler in late April was negative for DVT on the right.  In terms of her breast cancer, this was stage IIb invasive ductal carcinoma, grade 2, ER/PR positive, HER2 negative.  She underwent modified radical mastectomy in 2013 followed by 1 cycle of adjuvant chemotherapy.  She declined further chemotherapy as well as radiation.  She was treated with anastrozole for 5 years, completed in late 2018.  In terms  of her type 2 diabetes, the patient takes metformin twice daily.  Blood glucose at home is typically between 100-110.  Her last hemoglobin A1c in early April was 5.9%.  05/05/23: Robotic-assisted laparoscopic total hysterectomy with bilateral salpingoophorectomy, SLN biopsy, umbilical hernia repair   Interval History: Feeling well. Still some soreness. Bowels moving well. Denis urinary symptoms. Denies vaginal bleeding.   Past Medical/Surgical History: Past Medical History:  Diagnosis Date   Acute deep vein thrombosis (DVT) of popliteal vein of right lower extremity (HCC) 09/12/2020   AKI (acute kidney injury) (HCC) 02/09/2013   Arthritis    Breast cancer (HCC) 04/2012   right breast   Bruises easily    Cancer (HCC) 03/25/2012   right breast   Chronic back pain    arthritis   Diabetes mellitus without complication (HCC)    Elevated cholesterol    takes Crestor daily   Hemorrhoids    History of chemotherapy    x 1, pt unable to tolerate, on PO chemotherpay   Hypertension    takes Maxzide daily    Phlebitis 73yrs ago   hx of-   Seasonal allergies    takes Zyrtec daily and Nasal Spray prn    Past Surgical History:  Procedure Laterality Date   APPENDECTOMY  23yrs ago   BREAST EXCISIONAL BIOPSY Left    BREAST SURGERY  04/27/12   Right mod mastectomy, ER/PR +, HER2 -   BREAST SURGERY  04/27/12   Left Breast NL lumpectomy-no malignancy   COLONOSCOPY     MASTECTOMY Right 2013   PORT-A-CATH REMOVAL  11/24/2012   Procedure: REMOVAL  PORT-A-CATH;  Surgeon: [name], DO;  Location: [facility];  Service: General;  Laterality: Left;   PORTACATH PLACEMENT  04/26/2012   Procedure: INSERTION PORT-A-CATH;  Surgeon: [name], DO;  Location: [facility];  Service: General;  Laterality: Left;  Started at 1746.   ROBOTIC ASSISTED TOTAL HYSTERECTOMY WITH BILATERAL SALPINGO OOPHERECTOMY N/A 05/05/2023   Procedure: XI ROBOTIC ASSISTED TOTAL HYSTERECTOMY WITH BILATERAL SALPINGO  OOPHORECTOMY;  Surgeon: Carver Fila, MD;  Location: [facility];  Service: Gynecology;  Laterality: N/A;   SENTINEL NODE BIOPSY N/A 05/05/2023   Procedure: SENTINEL NODE BIOPSY;  Surgeon: Carver Fila, MD;  Location: [facility];  Service: Gynecology;  Laterality: N/A;   TUBAL LIGATION  79yrs ago   UMBILICAL HERNIA REPAIR N/A 05/05/2023   Procedure: HERNIA REPAIR UMBILICAL ADULT;  Surgeon: Carver Fila, MD;  Location: [facility];  Service: Gynecology;  Laterality: N/A;    Family History  Problem Relation Age of Onset   Cancer Mother        ovarian cancer vs uterine   Leukemia Father    Cancer Father        leukemia   Cancer Sister 15       colon cancer   Colon cancer Sister    Cancer Brother 46       colon cancer   Colon cancer Brother    Cancer Brother        prostate cancer, deceased   Anesthesia problems Neg Hx    Hypotension Neg Hx    Malignant hyperthermia Neg Hx    Pseudochol deficiency Neg Hx    Breast cancer Neg Hx    Ovarian cancer Neg Hx    Endometrial cancer Neg Hx    Pancreatic cancer Neg Hx    Prostate cancer Neg Hx     Social History   Socioeconomic History   Marital status: Married    Spouse name: Not on file   Number of children: 5   Years of education: Not on file   Highest education level: Not on file  Occupational History   Not on file  Tobacco Use   Smoking status: Former    Types: Cigarettes    Quit date: 01/10/1984    Years since quitting: 39.3   Smokeless tobacco: Never   Tobacco comments:    quit 30+yrs ago  Vaping Use   Vaping Use: Never used  Substance and Sexual Activity   Alcohol use: No   Drug use: No   Sexual activity: Yes    Birth control/protection: Post-menopausal    Comment: menarche age 21, P5,, 1st pregancy 89, menop 39, no HRT  Other Topics Concern   Not on file  Social History Narrative   Not on file   Social Determinants of Health   Financial Resource Strain: Medium Risk (04/15/2023)   Overall Financial  Resource Strain (CARDIA)    Difficulty of Paying Living Expenses: Somewhat hard  Food Insecurity: No Food Insecurity (05/06/2023)   Hunger Vital Sign    Worried About Running Out of Food in the Last Year: Never true    Ran Out of Food in the Last Year: Never true  Transportation Needs: No Transportation Needs (05/06/2023)   PRAPARE - Administrator, Civil Service (Medical): No    Lack of Transportation (Non-Medical): No  Physical Activity: Inactive (04/15/2023)   Exercise Vital Sign    Days of Exercise per Week: 0 days    Minutes of Exercise per  Session: 0 min  Stress: No Stress Concern Present (04/15/2023)   [employer] of Occupational Health - Occupational Stress Questionnaire    Feeling of Stress : Only a little  Social Connections: Socially Integrated (04/15/2023)   Social Connection and Isolation Panel [NHANES]    Frequency of Communication with Friends and Family: More than three times a week    Frequency of Social Gatherings with Friends and Family: More than three times a week    Attends Religious Services: More than 4 times per year    Active Member of [employer] or Organizations: Yes    Attends Banker Meetings: Never    Marital Status: Married    Current Medications:  Current Outpatient Medications:    amLODipine (NORVASC) 5 MG tablet, Take 1 tablet (5 mg total) by mouth daily., Disp: 30 tablet, Rfl: 2   apixaban (ELIQUIS) 2.5 MG TABS tablet, Take 1 tablet (2.5 mg total) by mouth 2 (two) times daily. FOR AFTER SURGERY ONLY, Disp: 28 tablet, Rfl: 0   diclofenac Sodium (VOLTAREN) 1 % GEL, Apply 2 g topically every 8 (eight) hours as needed (apply to knee)., Disp: 100 g, Rfl: 1   gabapentin (NEURONTIN) 300 MG capsule, Take 3 capsules (900 mg total) by mouth at bedtime., Disp: 90 capsule, Rfl: 2   glucose blood ([employer] GLUCOSE METER TEST STRIPS) test strip, Use as instructed, Disp: 100 each, Rfl: 12   Lancets (FREESTYLE) lancets, 1 each by Other route  daily., Disp: 100 each, Rfl: 2   metFORMIN (GLUCOPHAGE) 500 MG tablet, Take 0.5 tablets (250 mg total) by mouth 2 (two) times daily with a meal., Disp: 90 tablet, Rfl: 3   rosuvastatin (CRESTOR) 40 MG tablet, Take 1 tablet (40 mg total) by mouth daily., Disp: 30 tablet, Rfl:    senna-docusate (SENOKOT-S) 8.6-50 MG tablet, Take 2 tablets by mouth at bedtime. For AFTER surgery, do not take if having diarrhea, Disp: 30 tablet, Rfl: 0   traMADol (ULTRAM) 50 MG tablet, Take 1 tablet (50 mg total) by mouth every 6 (six) hours as needed for severe pain. For AFTER surgery only, do not take and drive, Disp: 10 tablet, Rfl: 0  Review of Symptoms: Pertinent positives as per HPI.  Physical Exam: Deferred given limitations of phone visit.  Laboratory & Radiologic Studies: A. LYMPH NODE SENTINEL, EXTERNAL ILIAC, EXCISION:      One lymph node, negative for metastatic carcinoma (0/1).  B. LYMPH NODE SENTINEL, RIGHT PRESACRAL, EXCISION:      One lymph node, negative for metastatic carcinoma (0/1).  C. UTERUS, CERVIX, BILATERAL TUBES  OVARIES, HYSTERECTOMY:  Uterus: Endometrioid adenocarcinoma, FIGO grade 2, with extensive squamous cell carcinoma component / squamous differentiation Depth of invasion: 95% of the full thickness of myometrium (17 mm / 18 mm) Substantial lymphovascular invasion (> 5 foci). Pathologic stage (AJCC 8th Edition): pT1b pN0. FIGO staging (2023): IIB. See oncology table and comment.  Other findings:      Cervix: Acute cervicitis. Negative for intraepithelial lesion or malignancy.       Endocervix: Acute and chronic endocervisitis. Negative for hyperplasia, atypia or malignancy.       Endometrium:           Background inactive endometrium with squamous metaplasia.       Myometrium: Adenomyosis.       Bilateral fallopian tubes: Paratubal cysts. Negative for malignancy.            Bilateral ovaries: Benign ovarian parenchyma with inclusion cysts. Negative for  malignancy.  D. HERNIA, HERNIORRAPHY:      Hernia sac.      Negative for malignancy.  ONCOLOGY TABLE:  UTERUS, CARCINOMA OR CARCINOSARCOMA: Resection  Procedure: Total hysterectomy and bilateral salpingo-oophorectomy Histologic Type: Endometrioid adenocarcinoma with extensive squamous differentiation Histologic Grade: Grade 2 Myometrial Invasion:      Depth of Myometrial Invasion (mm): 17 mm      Myometrial Thickness (mm): 18 mm      Percentage of Myometrial Invasion: 95% Uterine Serosa Involvement: Not identified Cervical stromal Involvement: Not identified Extent of involvement of other tissue/organs: Not identified Peritoneal/Ascitic Fluid: Not performed Lymphovascular Invasion: Identified (> 5 foci) Regional Lymph Nodes:      Pelvic Lymph Nodes Examined:          [2] Sentinel          [  ] Non-sentinel          [2] Total      Pelvic Lymph Nodes with Metastasis: 0          Macrometastasis: (>2.0 mm): 0          Micrometastasis: (>0.2 mm and < 2.0 mm): 0          Isolated Tumor Cells (<0.2 mm): 0          Laterality of Lymph Node with Tumor: NA          Extracapsular Extension: NA      Para-aortic Lymph Nodes Examined:          [  ] Sentinel          [  ] Non-sentinel          [  ] Total      Para-aortic Lymph Nodes with Metastasis: NA          Macrometastasis: (>2.0 mm): NA          Micrometastasis:  (>0.2 mm and < 2.0 mm): NA          Isolated Tumor Cells (<0.2 mm): NA          Laterality of Lymph Node with Tumor: NA          Extracapsular Extension: NA Distant Metastasis:      Distant Site(s) Involved: Not applicable Pathologic Stage Classification (pTNM, AJCC 8th Edition): pT1b, pN0 Ancillary Studies: Performed on prior biopsy [id number] Representative Tumor Block: C14 Comment(s): See comment (v4.2.0.1)  COMMENT:  The hysterectomy specimen contains an endometrial malignant neoplasm composed of glandular component and extensive squamous cell  carcinoma component. The background endometrium also reveals squamous metaplasia. The entire cervix is totally submitted for histological evaluation and no squamous intraepithelial lesion identified. The lower uterine segment is also free of malignancy. Immunohistochemical stain for p16 does not show block like pattern of staining in both glandular and squamous components. ER and PR are positive in the glandular component while they are completely negative in the squamous component. Overall, the tumor is best classified as endometroid adenocarcinoma with extensive squamous cell carcinoma component / squamous differentiation. Controls worked appropriately.   Assessment & Plan: Yvonne [name] is a 77 y.o. 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 95% MI, LVI+. MMR intact. P53 WT. ER+.  Patient doing well postop. Meeting milestones.  Discussed pathology from surgery. Her tumor has multiple high risk features including deep MI, LVI and SCC component vs squamous differentiation. I am recommending adjuvant radiation (at least VBT, consideration of EBRT). She has  hesitations about radiation, is willing to meet with [name] to hear about radiation before making a final decision.  I discussed the assessment and treatment plan with the patient. The patient was provided with an opportunity to ask questions and all were answered. The patient agreed with the plan and demonstrated an understanding of the instructions.   The patient was advised to call back or see an in-person evaluation if the symptoms worsen or if the condition fails to improve as anticipated.   12 minutes of total time was spent for this patient encounter, including preparation, phone counseling with the patient and coordination of care, and documentation of the encounter.   Eugene Garnet, MD  Division of Gynecologic  Oncology  Department of Obstetrics and Gynecology  [facility]

## 2023-05-12 NOTE — Telephone Encounter (Signed)
Yvonne [name] about referral to see [name].  She said she is still thinking about it and will decide at her appointment with [name] on 05/28/23.  Encouraged her to see [name] to discuss the treatments in more detail and she will think about it.

## 2023-05-13 ENCOUNTER — Telehealth: Payer: Self-pay | Admitting: Radiation Oncology

## 2023-05-13 NOTE — Telephone Encounter (Signed)
Called patient to schedule consult per 5/22 referral. Patient stated she did not want to schedule and did not want to receive Radiation treatment. Closed referral.

## 2023-05-25 ENCOUNTER — Encounter: Payer: Self-pay | Admitting: *Deleted

## 2023-05-26 ENCOUNTER — Encounter: Payer: Self-pay | Admitting: Oncology

## 2023-05-28 ENCOUNTER — Telehealth: Payer: Self-pay | Admitting: *Deleted

## 2023-05-28 ENCOUNTER — Ambulatory Visit: Payer: Medicare Other

## 2023-05-28 ENCOUNTER — Other Ambulatory Visit: Payer: Self-pay

## 2023-05-28 ENCOUNTER — Inpatient Hospital Stay: Payer: Medicare Other | Attending: Gynecologic Oncology | Admitting: Gynecologic Oncology

## 2023-05-28 VITALS — BP 155/72 | HR 79 | Temp 98.5°F | Resp 18 | Wt 189.0 lb

## 2023-05-28 DIAGNOSIS — C541 Malignant neoplasm of endometrium: Secondary | ICD-10-CM

## 2023-05-28 NOTE — Telephone Encounter (Signed)
Patient canceled genetics, provider notified

## 2023-05-28 NOTE — Progress Notes (Unsigned)
Patient reports doing well after surgery.  Tolerating diet with no nausea or emesis.  Bowels and bladder functioning without difficulty.  No vaginal bleeding or discharge reported.  Reports mild soreness at the incision at her umbilicus where she had a hernia repair done.  No concerns voiced with the incisions.  On exam she does have an inflamed hair follicle on the left perineal region.  No fluctuance, no increased redness, no lower extremity edema noted.  Vaginal cuff intact on exam.  Lungs clear heart regular in rate and rhythm.  Active bowel sounds.

## 2023-05-28 NOTE — Patient Instructions (Addendum)
It was great seeing you today! You are healing well after surgery.  Plan to continue with restrictions of no heavy lifting over 10 lbs, no strenuous activities. Nothing in the vagina including intercourse for a total of 10-12 weeks.  You can apply a warm compress to the inflamed hair follicle on your left vulva/bottom and can use neosporin if needed.   [name] is recommending vaginal brachytherapy radiation treatments to the top of the vagina. If after discussing with your family you decide to meet with the radiation doctor to discuss, please call our office and we will arrange this.

## 2023-05-31 ENCOUNTER — Ambulatory Visit (INDEPENDENT_AMBULATORY_CARE_PROVIDER_SITE_OTHER): Payer: Medicare Other

## 2023-05-31 ENCOUNTER — Other Ambulatory Visit: Payer: Self-pay | Admitting: Oncology

## 2023-05-31 ENCOUNTER — Other Ambulatory Visit (HOSPITAL_COMMUNITY): Payer: Self-pay

## 2023-05-31 ENCOUNTER — Other Ambulatory Visit: Payer: Self-pay

## 2023-05-31 VITALS — BP 136/66 | HR 68 | Temp 98.2°F | Ht 65.0 in | Wt 189.2 lb

## 2023-05-31 DIAGNOSIS — I10 Essential (primary) hypertension: Secondary | ICD-10-CM

## 2023-05-31 DIAGNOSIS — C541 Malignant neoplasm of endometrium: Secondary | ICD-10-CM | POA: Diagnosis not present

## 2023-05-31 DIAGNOSIS — Z7901 Long term (current) use of anticoagulants: Secondary | ICD-10-CM

## 2023-05-31 DIAGNOSIS — E1129 Type 2 diabetes mellitus with other diabetic kidney complication: Secondary | ICD-10-CM | POA: Diagnosis not present

## 2023-05-31 DIAGNOSIS — I825Y1 Chronic embolism and thrombosis of unspecified deep veins of right proximal lower extremity: Secondary | ICD-10-CM | POA: Diagnosis not present

## 2023-05-31 DIAGNOSIS — Z7984 Long term (current) use of oral hypoglycemic drugs: Secondary | ICD-10-CM

## 2023-05-31 DIAGNOSIS — R809 Proteinuria, unspecified: Secondary | ICD-10-CM

## 2023-05-31 NOTE — Assessment & Plan Note (Signed)
Current medications include amLODipine 5 MG daily. Patient was previously on losartan 25 mg daily but told to hod due to AKI. Patient states that they are compliant with this medication regimen. Patient denies HA, lightheadedness, dizziness, CP, or SOB. Initial BP today is 145/62. Repeat BP is 136/66. Patient reports having difficulty affording her medications, therefore will discontinue amlodipine at this time. Will restart losartan for BP control and history of microalbuminuria. Normal renal function on BMP from 3 weeks ago.   Plan: - Discontinue amLODipine 5 MG daily - Restart losartan 25 mg daily - BMP today

## 2023-05-31 NOTE — Progress Notes (Signed)
Gynecologic Oncology Multi-Disciplinary Disposition Conference Note  Date of the Conference: 05/31/2023  Patient Name: Yvonne [name]  Referring Provider: Dr. Charlotta Newton Primary GYN Oncologist: [name]   Stage/Disposition:  Stage IIB, grade 2 endometrioid endometrial adenocarcinoma. Disposition is to adjuvant radiation with consideration for EBRT and vaginal brachytherapy. Patient has refused adjuvant treatment so plan is for surveillance.   This Multidisciplinary conference took place involving physicians from Gynecologic Oncology, Medical Oncology, Radiation Oncology, Pathology, Radiology along with the Gynecologic Oncology Nurse Practitioner and Gynecologic Oncology Nurse Navigator.  Comprehensive assessment of the patient's malignancy, staging, need for surgery, chemotherapy, radiation therapy, and need for further testing were reviewed. Supportive measures, both inpatient and following discharge were also discussed. The recommended plan of care is documented. Greater than 35 minutes were spent correlating and coordinating this patient's care.

## 2023-05-31 NOTE — Patient Instructions (Addendum)
Thank you for coming to see [place] in clinic Ms. Mkrtchyan.  Plan:  - Please stop taking:     - Amlodipine 5 mg daily  - Please start taking:     - Losartan 25 mg daily  - Please continue taking:     - Metformin 250 mg twice daily   - Jardiance 10 mg daily  - We got the following labs today and will call you with the results:     - BMP to check your kidney function and electrolytes     Return Precautions: If you develop lightheadedness, blurry vision, loss of consciousness, falls, please call our clinic and visit the emergency department as these can be signs of low blood pressure.    It was very nice to see you, thank you for allowing [place] to be involved in your care. We look forward to seeing you next time. Please call our clinic at [phone] if you have any questions or concerns. The best time to call is Monday-Friday from 9am-4pm, but there is someone available 24/7. If after hours or the weekend, call the main hospital number at [phone] and ask for the Internal Medicine Resident On-Call. If you need medication refills, please notify your pharmacy one week in advance and they will send [place] a request.   Please make sure to arrive 15 minutes prior to your next appointment. If you arrive late, you may be asked to reschedule.

## 2023-05-31 NOTE — Assessment & Plan Note (Signed)
Patient has a history of unprovoked DVT in 2021. 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 Was initially on Eliquis and transitioned to Xarelto. Xarelto was held in 04/2023 due to vaginal bleeding likely 2/2 endometrial adenocarcinoma. Now s/p total hysterectomy with b/l salpingo oophorectomy on 5/15. Patient denies continued bleeding at this time. Denies extremity pain. Patient reports having difficulty affording this medication and has had to ask her children for money to help pay for this. Will message Durward Mallard regarding any assistance with affording this medication.   Plan: - Consider restarting Xarelto if there is an option for financial assistance available

## 2023-05-31 NOTE — Assessment & Plan Note (Signed)
This condition was last discussed during a telehealth visit with our clinic on 5/17. Patient underwent total hysterectomy with b/l salpingo oophorectomy on 5/15 for endometrial adenocarcinoma. Plan was to start patient on Eliquis 2.5 mg BID x 2 weeks for postop DVT prophylaxis. Patient reports compliance with this medication. Denies bleeding. Patient follows w/ Gyn/onc and last saw them on 6/7.    Plan: - Continue f/u w/ Gyn/onc

## 2023-05-31 NOTE — Progress Notes (Signed)
CC: f/u chronic DVT  HPI:  Yvonne [name] is a 77 y.o. female with past medical history of HTN, HLD, T2DM, chronic DVT, endometrial adenocarcinoma, malignant neoplasm of the right breast (s/p modified radical mastectomy and chemotherapy), OA that presents for f/u chronic DVT.    Allergies as of 05/31/2023       Reactions   Penicillins Anaphylaxis   Has taken Keflex without issue (Dec 2023)        Medication List        Accurate as of May 31, 2023 11:24 AM. If you have any questions, ask your nurse or doctor.          amLODipine 5 MG tablet Commonly known as: NORVASC Take 1 tablet (5 mg total) by mouth daily.   apixaban 2.5 MG Tabs tablet Commonly known as: Eliquis Take 1 tablet (2.5 mg total) by mouth 2 (two) times daily. FOR AFTER SURGERY ONLY   [facility] Glucose Meter Test Strips test strip Generic drug: glucose blood Use as instructed   diclofenac Sodium 1 % Gel Commonly known as: VOLTAREN Apply 2 g topically every 8 (eight) hours as needed (apply to knee).   freestyle lancets 1 each by Other route daily.   gabapentin 300 MG capsule Commonly known as: NEURONTIN Take 3 capsules (900 mg total) by mouth at bedtime.   metFORMIN 500 MG tablet Commonly known as: GLUCOPHAGE Take 0.5 tablets (250 mg total) by mouth 2 (two) times daily with a meal.   rosuvastatin 40 MG tablet Commonly known as: CRESTOR Take 1 tablet (40 mg total) by mouth daily.   senna-docusate 8.6-50 MG tablet Commonly known as: Senokot-S Take 2 tablets by mouth at bedtime. For AFTER surgery, do not take if having diarrhea   traMADol 50 MG tablet Commonly known as: ULTRAM Take 1 tablet (50 mg total) by mouth every 6 (six) hours as needed for severe pain. For AFTER surgery only, do not take and drive         Past Medical History:  Diagnosis Date   Acute deep vein thrombosis (DVT) of popliteal vein of right lower extremity (HCC) 09/12/2020   AKI (acute kidney injury) (HCC)  02/09/2013   Arthritis    Breast cancer (HCC) 04/2012   right breast   Bruises easily    Cancer (HCC) 03/25/2012   right breast   Chronic back pain    arthritis   Diabetes mellitus without complication (HCC)    Elevated cholesterol    takes Crestor daily   Hemorrhoids    History of chemotherapy    x 1, pt unable to tolerate, on PO chemotherpay   Hypertension    takes Maxzide daily    Phlebitis 47yrs ago   hx of-   Seasonal allergies    takes Zyrtec daily and Nasal Spray prn   Review of Systems:  per HPI.   Physical Exam: Vitals:   05/31/23 0931 05/31/23 1027  BP: (!) 145/62 136/66  Pulse: 69 68  Temp: 98.2 F (36.8 C)   TempSrc: Oral   SpO2: 98%   Weight: 189 lb 3.2 oz (85.8 kg)   Height: 5\' 5"  (1.651 m)    Constitutional: Well-developed, well-nourished, appears comfortable  HENT: Normocephalic and atraumatic.  Eyes: EOM are normal.  Neck: Normal range of motion.  Cardiovascular: Regular rate, regular rhythm. No murmurs, rubs, or gallops. Normal radial and PT pulses bilaterally. No LE edema.  Pulmonary: Normal respiratory effort. No wheezes, rales, or rhonchi.   Abdominal:  Soft. Non-distended. No tenderness. Normal bowel sounds.  Musculoskeletal: Normal gait.  Neurological: Alert and oriented to person, place, and time. Non-focal. Skin: warm and dry.    Assessment & Plan:   See Encounters Tab for problem based charting.  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) Patient has a history of unprovoked DVT in 2021. 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 Was initially on Eliquis and transitioned to Xarelto. Xarelto was held in 04/2023 due to vaginal bleeding likely 2/2 endometrial adenocarcinoma. Now s/p total hysterectomy with b/l salpingo oophorectomy on 5/15. Patient denies continued bleeding at this time. Denies extremity pain. Patient reports having difficulty affording this medication and has had to ask her children for money to  help pay for this. Will message Durward Mallard regarding any assistance with affording this medication.   Plan: - Consider restarting Xarelto if there is an option for financial assistance available  Endometrial adenocarcinoma [facility]) This condition was last discussed during a telehealth visit with our clinic on 5/17. Patient underwent total hysterectomy with b/l salpingo oophorectomy on 5/15 for endometrial adenocarcinoma. Plan was to start patient on Eliquis 2.5 mg BID x 2 weeks for postop DVT prophylaxis. Patient reports compliance with this medication. Denies bleeding. Patient follows w/ Gyn/onc and last saw them on 6/7.    Plan: - Continue f/u w/ Gyn/onc  Hypertension  Current medications include amLODipine 5 MG daily. Patient was previously on losartan 25 mg daily but told to hod due to AKI. Patient states that they are compliant with this medication regimen. Patient denies HA, lightheadedness, dizziness, CP, or SOB. Initial BP today is 145/62. Repeat BP is 136/66. Patient reports having difficulty affording her medications, therefore will discontinue amlodipine at this time. Will restart losartan for BP control and history of microalbuminuria. Normal renal function on BMP from 3 weeks ago.   Plan: - Discontinue amLODipine 5 MG daily - Restart losartan 25 mg daily - BMP today  Type 2 diabetes mellitus with proteinuria (HCC) Current medications include metFORMIN 250 MG BID. Previously on jardiance 10 mg daily but instructed to hold this medication due to AKI. Patient states that they are compliant with this medication regimen. Patient denies polyuria, polydipsia, fatigue. A1c was 5.9% two months ago. Normal renal function on BMP from 3 weeks ago. Will restart jardiance.   Plan: - Continue metFORMIN 250 MG BID - Restart jardiance 10 mg daily - BMP today - Repeat A1c in 1 month    Patient seen with Dr. Mikey Bussing.

## 2023-05-31 NOTE — Assessment & Plan Note (Signed)
Current medications include metFORMIN 250 MG BID. Previously on jardiance 10 mg daily but instructed to hold this medication due to AKI. Patient states that they are compliant with this medication regimen. Patient denies polyuria, polydipsia, fatigue. A1c was 5.9% two months ago. Normal renal function on BMP from 3 weeks ago. Will restart jardiance.   Plan: - Continue metFORMIN 250 MG BID - Restart jardiance 10 mg daily - BMP today - Repeat A1c in 1 month

## 2023-06-01 ENCOUNTER — Telehealth: Payer: Self-pay | Admitting: Student

## 2023-06-01 LAB — BMP8+ANION GAP
Anion Gap: 19 mmol/L — ABNORMAL HIGH (ref 10.0–18.0)
BUN/Creatinine Ratio: 15 (ref 12–28)
BUN: 10 mg/dL (ref 8–27)
CO2: 23 mmol/L (ref 20–29)
Calcium: 9.8 mg/dL (ref 8.7–10.3)
Chloride: 102 mmol/L (ref 96–106)
Creatinine, Ser: 0.66 mg/dL (ref 0.57–1.00)
Glucose: 94 mg/dL (ref 70–99)
Potassium: 3.4 mmol/L — ABNORMAL LOW (ref 3.5–5.2)
Sodium: 144 mmol/L (ref 134–144)
eGFR: 91 mL/min/{1.73_m2} (ref >59)

## 2023-06-01 NOTE — Telephone Encounter (Signed)
Patient called the on call pager today around 1725: I called the patient back to ask what her concerns were. She wanted to know what her lab results were as Dr. Peterson Lombard called her earlier today and she did not pick up the phone. I let her know that he potassium was a little low but other than that her kidney function and her other electrolytes were within normal limits. I told her to call back if she has any other concerns.

## 2023-06-01 NOTE — Progress Notes (Signed)
Internal Medicine Clinic Attending  Case discussed with the resident at the time of the visit.  We reviewed the resident's history and exam and pertinent patient test results.  I agree with the assessment, diagnosis, and plan of care documented in the resident's note.  

## 2023-06-02 ENCOUNTER — Other Ambulatory Visit: Payer: Self-pay

## 2023-06-02 DIAGNOSIS — E876 Hypokalemia: Secondary | ICD-10-CM

## 2023-06-02 MED ORDER — POTASSIUM CHLORIDE CRYS ER 20 MEQ PO TBCR: 20.0000 meq | EXTENDED_RELEASE_TABLET | Freq: Every day | ORAL | 0 refills | Status: DC

## 2023-06-02 NOTE — Progress Notes (Signed)
Called patient and updated on mildly low potassium. Not on any potassium-lowering medications. Not having vomiting or diarrhea. Will prescribe 2 day course of potassium and recheck BMP at lab only visit in 1-2 weeks. Patient agrees with the plan.

## 2023-06-04 ENCOUNTER — Other Ambulatory Visit: Payer: Self-pay

## 2023-06-14 ENCOUNTER — Institutional Professional Consult (permissible substitution): Payer: Medicare Other | Admitting: Radiation Oncology

## 2023-06-15 ENCOUNTER — Other Ambulatory Visit: Payer: Medicare Other

## 2023-06-15 ENCOUNTER — Telehealth: Payer: Self-pay

## 2023-06-15 ENCOUNTER — Encounter: Payer: Medicare Other | Admitting: Genetic Counselor

## 2023-06-15 NOTE — Telephone Encounter (Signed)
Will mail [employer] patient assistance application for Xarelto to pt's home.   In the meantime hopefully patient can apply for LIS with social security. I will also include info in the paperwork on how to go about this.

## 2023-06-15 NOTE — Telephone Encounter (Signed)
-----   Message from Karoline Caldwell, MD sent at 06/04/2023  3:09 PM EDT ----- Peri Jefferson afternoon Durward Mallard,  Thank you so much for your help! I mailed her a document regarding this low income subsidy as I feel she may benefit from being able to read this information and have it on hand. If you could reach out to her as well that would be amazing! Thank you so much.  Best wishes,  Tavien Mapp ----- Message ----- From: Otho Najjar, CPhT Sent: 05/31/2023   2:37 PM EDT To: Karoline Caldwell, MD  Submitted test claim for patients Xarelto 20mg  tabs. Copay is $47.   Patient will need to apply for Low Income Subsidy with Social Security Admin before attempting to move forward with assistance (this can be done online or over the phone [phone]). The company that does assistance for this medication has been very difficult in the past! I can hardly get any medicare patients approved. If she is denied Low Income Subsidy, I can attempt to move forward. If she's approved, this will help the copay of all her medications.  Just let me know if I need to reach out! ----- Message ----- From: Karoline Caldwell, MD Sent: 05/31/2023  11:18 AM EDT To: Lamount Cohen Everette, CPhT  Good morning Durward Mallard,  This patient is having difficulty affording her Xarelto. She has Medicare. I was wondering if were any options we could offer her regarding assistance with affording this medication? Thank you for your help.  Best wishes,  Karoline Caldwell

## 2023-06-16 ENCOUNTER — Other Ambulatory Visit (HOSPITAL_COMMUNITY): Payer: Self-pay

## 2023-06-17 ENCOUNTER — Other Ambulatory Visit: Payer: Medicare Other

## 2023-06-29 NOTE — Telephone Encounter (Signed)
Rec'd patients pages back.  Will get provider pages to office for signature.

## 2023-07-07 NOTE — Telephone Encounter (Signed)
Submitted application for XARELTO to [facility] for patient assistance.   Phone: [phone]  (Med will need to be added to patients med list)

## 2023-07-20 ENCOUNTER — Ambulatory Visit: Payer: Medicare Other | Admitting: Nurse Practitioner

## 2023-07-26 ENCOUNTER — Telehealth: Payer: Self-pay

## 2023-07-26 NOTE — Telephone Encounter (Signed)
Pt is requesting a back she stated that she dropped of paperwork to be filled for her insurance  and she is wanting to know if they were faxed off as of yet

## 2023-08-11 ENCOUNTER — Encounter: Payer: Self-pay | Admitting: Student

## 2023-08-11 ENCOUNTER — Other Ambulatory Visit: Payer: Self-pay

## 2023-08-11 ENCOUNTER — Ambulatory Visit (INDEPENDENT_AMBULATORY_CARE_PROVIDER_SITE_OTHER): Payer: Medicare Other | Admitting: Student

## 2023-08-11 ENCOUNTER — Telehealth: Payer: Self-pay | Admitting: *Deleted

## 2023-08-11 VITALS — BP 151/78 | HR 68 | Temp 98.6°F | Ht 65.0 in | Wt 185.9 lb

## 2023-08-11 DIAGNOSIS — Z86718 Personal history of other venous thrombosis and embolism: Secondary | ICD-10-CM

## 2023-08-11 DIAGNOSIS — M545 Low back pain, unspecified: Secondary | ICD-10-CM

## 2023-08-11 DIAGNOSIS — I10 Essential (primary) hypertension: Secondary | ICD-10-CM | POA: Diagnosis not present

## 2023-08-11 DIAGNOSIS — Z7984 Long term (current) use of oral hypoglycemic drugs: Secondary | ICD-10-CM

## 2023-08-11 DIAGNOSIS — R350 Frequency of micturition: Secondary | ICD-10-CM | POA: Diagnosis not present

## 2023-08-11 DIAGNOSIS — E1129 Type 2 diabetes mellitus with other diabetic kidney complication: Secondary | ICD-10-CM | POA: Diagnosis not present

## 2023-08-11 DIAGNOSIS — I159 Secondary hypertension, unspecified: Secondary | ICD-10-CM

## 2023-08-11 DIAGNOSIS — M549 Dorsalgia, unspecified: Secondary | ICD-10-CM | POA: Insufficient documentation

## 2023-08-11 DIAGNOSIS — R809 Proteinuria, unspecified: Secondary | ICD-10-CM

## 2023-08-11 LAB — POCT URINALYSIS DIPSTICK
Blood, UA: NEGATIVE
Glucose, UA: NEGATIVE
Ketones, UA: NEGATIVE
Leukocytes, UA: NEGATIVE
Nitrite, UA: NEGATIVE
Protein, UA: POSITIVE — AB
Spec Grav, UA: >=1.03 — AB (ref 1.010–1.025)
Urobilinogen, UA: 0.2 E.U./dL
pH, UA: 6 (ref 5.0–8.0)

## 2023-08-11 MED ORDER — IBUPROFEN 200 MG PO TABS
400.0000 mg | ORAL_TABLET | Freq: Once | ORAL | Status: AC
Start: 2023-08-11 — End: 2023-08-11
  Administered 2023-08-11: 400 mg via ORAL

## 2023-08-11 MED ORDER — METHOCARBAMOL 750 MG PO TABS: 750.0000 mg | ORAL_TABLET | Freq: Three times a day (TID) | ORAL | 0 refills | Status: DC

## 2023-08-11 NOTE — Assessment & Plan Note (Addendum)
Patient presents with 2 days of stable, midline lower back pain.No recent history of injury or falls. Pain not worsened by positional change or ambulation. Limited improvement with Tylenol, warm compress, and rest. Pain is reproducible with deep palpation. Patient denies any urinary incontinence, saddle anesthesia, weakness, or fever. On exam, straight leg raise test negative on R and L. Bilateral lower leg extremity strength 5/5, no swelling, sensation intact. Less concern for aortic or vertebral dissection, infection/abscess, or radiculopathy. Pain is more likely musculoskeletal. Patient's urine concentrated on UA, pointing to possible dehydration that can worsen muscle spasms. Patient's medication options limited by limited finances.  Plan:  - Sent in prescription for robaxin 750 TID, told patient to pick up if affordable otherwise to continue using Tylenol extra strength PRN. Counseled on robaxin and possible side effects, especially drowsiness and dizziness given with patient's use of a cane to help ambulate. - Ordered clinic administration of ibuprofen 400 mg  - Provided patient with sample of Voltaren gel - Recommended continued use of warm compresses for symptom relief - Advised to call clinic if pain suddenly worsens or she experiences more severe symptoms like weakness or urinary incontinence

## 2023-08-11 NOTE — Patient Instructions (Signed)
Thank you, [name] for allowing [place] to provide your care today. Today we discussed your back pain, diabetes and HTN management.   I have ordered the following labs for you:  Lab Orders         Hemoglobin A1C         POCT Urinalysis Dipstick (81002)      Tests ordered today:    Referrals ordered today:   Referral Orders  No referral(s) requested today     I have ordered the following medication/changed the following medications:   Stop the following medications: There are no discontinued medications.   Start the following medications: Meds ordered this encounter  Medications   ibuprofen (ADVIL) tablet 400 mg   methocarbamol (ROBAXIN-750) 750 MG tablet    Sig: Take 1 tablet (750 mg total) by mouth 3 (three) times daily.    Dispense:  90 tablet    Refill:  0     Follow up:   We will see you on 9/10 at 9:45 AM.    Remember:   - Drink more water - Continue to take Tylenol  - Use Voltaren gel  - LET us KNOW if your pain worsens - Please be careful with the Robaxin (new muscle pain medication) as it can make you feel unsteady. Take at night first, then be safe when you take in the day, try to rest so you know how you react to it.   Should you have any questions or concerns please call the internal medicine clinic at [phone].     Savana Spina Colbert Coyer, MD PGY-1 Internal Medicine Teaching [facility]

## 2023-08-11 NOTE — Assessment & Plan Note (Addendum)
Patient taking metformin 250 BID, tolerating well. Did not start taking Jardiance 10 mg as the medication was too expensive. A1C 5.9% as of April 2024. Patient reporting increased urinary frequency at night. Starting Jardiance would likely worsen urinary frequency. Plan:  - Repeat A1C today  - Plan on increasing Metformin to 500 BID (currently 500 total daily) when patient is covered by insurance

## 2023-08-11 NOTE — Assessment & Plan Note (Addendum)
Patient with a history of provoked DVT (and possible a PE at the time of a pregnancy) and an unprovoked DVT in 2021. Work up revealed positive lupus anticoagulant. She had initially been on Eliquis, was transition to Xarelto. She was on prophylactic dosing until recently around the time of her vaginal bleeding.   Now s/p total hysterectomy with b/l salpingo oophorectomy on 05/05/23. Instructed to start Eliquis 2.5 mg BID x 2 weeks for postop DVT prophylaxis on 05/09/23, but patient reported difficulties obtaining it. Followed up with OBGYN on 05/28/23. No mention of anticoagulation at that visit. No complaints of lower leg pain or edema, and no signs of DVT on exam today.

## 2023-08-11 NOTE — Assessment & Plan Note (Signed)
Patient's blood pressure today 162/84 with a repeat of 151/78. Patient only taking Losartan 25 mg consistently, well tolerated. Denies CP, SOB, headache, or vision changes. Previously prescribed amlodipine 5 mg in June. She seemed unaware of this new medication and also reports financial difficulties. Applied to [facility] and hopes to hear back in the next few weeks.  Plan:  - F/u in 1 month for BP and determine if patient has Medicaid to START amlodipine 5 mg.

## 2023-08-11 NOTE — Assessment & Plan Note (Addendum)
Patient with increased urinary frequency for 1 week especially at night. Denies dysuria, changes in urine color or smell, fevers, or chills. Patient denied suprapubic pain or flank pain on exam. UA negative for infection, urine was noted to be concentrated. Polyuria/nocturia likely related to diabetes. Will repeat A1C today.

## 2023-08-11 NOTE — Assessment & Plan Note (Signed)
>>  ASSESSMENT AND PLAN FOR HISTORY OF DVT (DEEP VEIN THROMBOSIS) WRITTEN ON 08/11/2023  3:28 PM BY ARELLANO ZAMEZA, PRISCILA, MD  Patient with a history of provoked DVT (and possible a PE at the time of a pregnancy) and an unprovoked DVT in 2021. Work up revealed positive lupus anticoagulant. She had initially been on Eliquis , was transition to Xarelto . She was on prophylactic dosing until recently around the time of her vaginal bleeding.   Now s/p total hysterectomy with b/l salpingo oophorectomy on 05/05/23. Instructed to start Eliquis  2.5 mg BID x 2 weeks for postop DVT prophylaxis on 05/09/23, but patient reported difficulties obtaining it. Followed up with OBGYN on 05/28/23. No mention of anticoagulation at that visit. No complaints of lower leg pain or edema, and no signs of DVT on exam today.

## 2023-08-11 NOTE — Progress Notes (Signed)
Acute Office Visit  Subjective:     Patient ID: Yvonne [name], female    DOB: 08/18/1946, 77 y.o.   MRN: [id number]  Chief Complaint  Patient presents with   Back Pain    Lower back pain #10 x 2 days / dm    Back Pain Pertinent negatives include no abdominal pain, chest pain, dysuria, fever or headaches.   Patient is a 77 y.o. with a past medical history stated below who presents today for 2 days of lower back pain as well as increased urinary frequency.   Per patient, pain started gradually in her lower back about two days ago and has been pretty stable. She denies any injury or falls. Pain is described as a deeper, muscular pain but can sometimes have some pain down her left leg. Pain described as more of a muscle spasm than a shooting pain. The pain is not worsened by prolonged sitting or standing, but it does become bothersome at night when she sleeps. She walks with the assistance of a cane and denies pain with ambulation. She has been taking Tylenol as needed (about twice a day), using warm compresses, and resting with minimal improvement.   Patient also reports about a week of increased urinary frequency, especially at night. Denies any pain or dysuria, as well as changes in urine color or smell. Last A1C 5.9% 4 months ago. Patient told to start Jardiance at that visit in April, but patient reports it was too expensive and she did not start it. She has been taking metformin 250 mg BID.   Patient brought in all of her medications today. Reports she never started the amlodipine 5 mg that was prescribed in June. Has been taking the Losartan 25 mg daily for her HTN. For neuropathic pain patient is not taking her gabapentin 300 mg TID. For HLD she is taking Rosuvastatin 40 mg. Patient with history of DVT, reported NOT taking Eliquis 2.5 mg BID. Per chart review, plan was to take Eliquis 2.5 mg BID x 2 weeks for postop DVT prophylaxis.   Reports it's difficult to obtain medications at  this time as she is experiencing a gap in her insurance coverage but signed up for medicaid as of last week, should hear back in a month.   Past Medical History:  Diagnosis Date   Acute deep vein thrombosis (DVT) of popliteal vein of right lower extremity (HCC) 09/12/2020   AKI (acute kidney injury) (HCC) 02/09/2013   Arthritis    Breast cancer (HCC) 04/2012   right breast   Bruises easily    Cancer (HCC) 03/25/2012   right breast   Chronic back pain    arthritis   Diabetes mellitus without complication (HCC)    Elevated cholesterol    takes Crestor daily   Hemorrhoids    History of chemotherapy    x 1, pt unable to tolerate, on PO chemotherpay   Hypertension    takes Maxzide daily    Phlebitis 46yrs ago   hx of-   Seasonal allergies    takes Zyrtec daily and Nasal Spray prn     Review of Systems  Constitutional:  Negative for chills, fever and malaise/fatigue.  Respiratory:  Negative for shortness of breath.   Cardiovascular:  Negative for chest pain and palpitations.  Gastrointestinal:  Negative for abdominal pain, diarrhea, nausea and vomiting.  Genitourinary:  Positive for frequency. Negative for dysuria.  Musculoskeletal:  Positive for back pain.  Neurological:  Negative for dizziness  and headaches.      Objective:    BP (!) 151/78 (BP Location: Left Arm, Patient Position: Sitting, Cuff Size: Normal)   Pulse 68   Temp 98.6 F (37 C) (Oral)   Ht 5\' 5"  (1.651 m)   Wt 185 lb 14.4 oz (84.3 kg)   SpO2 96%   BMI 30.94 kg/m  BP Readings from Last 3 Encounters:  08/11/23 (!) 151/78  05/31/23 136/66  05/28/23 (!) 155/72   Wt Readings from Last 3 Encounters:  08/11/23 185 lb 14.4 oz (84.3 kg)  05/31/23 189 lb 3.2 oz (85.8 kg)  05/28/23 189 lb (85.7 kg)   Physical Exam HENT:     Head: Normocephalic and atraumatic.  Eyes:     Extraocular Movements: Extraocular movements intact.     Pupils: Pupils are equal, round, and reactive to light.  Cardiovascular:      Rate and Rhythm: Normal rate and regular rhythm.     Pulses: Normal pulses.     Heart sounds: Normal heart sounds.  Pulmonary:     Effort: Pulmonary effort is normal.     Breath sounds: Normal breath sounds.  Abdominal:     General: Bowel sounds are normal.     Palpations: Abdomen is soft.  Musculoskeletal:        General: No swelling.     Comments: Negative straight leg raise test bilaterally. Tenderness along midline back with deeper palpation. Bilateral lower leg extremity strength 5/5, no swelling, sensation intact. Bilateral upper extremity strength 5/5, sensation intact.  Skin:    General: Skin is warm and dry.  Neurological:     Mental Status: She is alert. Mental status is at baseline.  Psychiatric:        Mood and Affect: Mood normal.        Behavior: Behavior normal.    Results for orders placed or performed in visit on 08/11/23  POCT Urinalysis Dipstick (81002)  Result Value Ref Range   Color, UA Yellow    Clarity, UA Clear    Glucose, UA Negative Negative   Bilirubin, UA Small    Ketones, UA Negative    Spec Grav, UA >=1.030 (A) 1.010 - 1.025   Blood, UA Negative    pH, UA 6.0 5.0 - 8.0   Protein, UA Positive (A) Negative   Urobilinogen, UA 0.2 0.2 or 1.0 E.U./dL   Nitrite, UA Negative    Leukocytes, UA Negative Negative      Assessment & Plan:   Problem List Items Addressed This Visit       Cardiovascular and Mediastinum   Hypertension    Patient's blood pressure today 162/84 with a repeat of 151/78. Patient only taking Losartan 25 mg consistently, well tolerated. Denies CP, SOB, headache, or vision changes. Previously prescribed amlodipine 5 mg in June. She seemed unaware of this new medication and also reports financial difficulties. Applied to [facility] and hopes to hear back in the next few weeks.  Plan:  - F/u in 1 month for BP and determine if patient has Medicaid to START amlodipine 5 mg.         Endocrine   Type 2 diabetes mellitus with  proteinuria (HCC)    Patient taking metformin 250 BID, tolerating well. Did not start taking Jardiance 10 mg as the medication was too expensive. A1C 5.9% as of April 2024. Patient reporting increased urinary frequency at night. Starting Jardiance would likely worsen urinary frequency. Plan:  - Repeat A1C today  - Plan  on increasing Metformin to 500 BID (currently 500 total daily) when patient is covered by insurance      Relevant Orders   Hemoglobin A1C     Other   History of DVT (deep vein thrombosis)    Patient with a history of provoked DVT (and possible a PE at the time of a pregnancy) and an unprovoked DVT in 2021. Work up revealed positive lupus anticoagulant. She had initially been on Eliquis, was transition to Xarelto. She was on prophylactic dosing until recently around the time of her vaginal bleeding.   Now s/p total hysterectomy with b/l salpingo oophorectomy on 05/05/23. Instructed to start Eliquis 2.5 mg BID x 2 weeks for postop DVT prophylaxis on 05/09/23, but patient reported difficulties obtaining it. Followed up with OBGYN on 05/28/23. No mention of anticoagulation at that visit. No complaints of lower leg pain or edema, and no signs of DVT on exam today.       Back pain    Patient presents with 2 days of stable, midline lower back pain.No recent history of injury or falls. Pain not worsened by positional change or ambulation. Limited improvement with Tylenol, warm compress, and rest. Pain is reproducible with deep palpation. Patient denies any urinary incontinence, saddle anesthesia, weakness, or fever. On exam, straight leg raise test negative on R and L. Bilateral lower leg extremity strength 5/5, no swelling, sensation intact. Less concern for aortic or vertebral dissection, infection/abscess, or radiculopathy. Pain is more likely musculoskeletal. Patient's urine concentrated on UA, pointing to possible dehydration that can worsen muscle spasms. Patient's medication options  limited by limited finances.  Plan:  - Sent in prescription for robaxin 750 TID, told patient to pick up if affordable otherwise to continue using Tylenol extra strength PRN. Counseled on robaxin and possible side effects, especially drowsiness and dizziness given with patient's use of a cane to help ambulate. - Ordered clinic administration of ibuprofen 400 mg  - Provided patient with sample of Voltaren gel - Recommended continued use of warm compresses for symptom relief - Advised to call clinic if pain suddenly worsens or she experiences more severe symptoms like weakness or urinary incontinence      Relevant Medications   methocarbamol (ROBAXIN-750) 750 MG tablet   Increased urinary frequency    Patient with increased urinary frequency for 1 week especially at night. Denies dysuria, changes in urine color or smell, fevers, or chills. Patient denied suprapubic pain or flank pain on exam. UA negative for infection, urine was noted to be concentrated. Polyuria/nocturia likely related to diabetes. Will repeat A1C today.       Other Visit Diagnoses     Urine frequency    -  Primary   Relevant Orders   POCT Urinalysis Dipstick (82956) (Completed)       Meds ordered this encounter  Medications   ibuprofen (ADVIL) tablet 400 mg   methocarbamol (ROBAXIN-750) 750 MG tablet    Sig: Take 1 tablet (750 mg total) by mouth 3 (three) times daily.    Dispense:  90 tablet    Refill:  0    No follow-ups on file.  Patient seen with Dr. Mikey Bussing.   Rhyann Berton Colbert Coyer, MD

## 2023-08-11 NOTE — Telephone Encounter (Signed)
Patient walked into  Clinics with c/o of lower back pain.  Pain goes down leg and thigh on left side. Frequency for the past 2 days. No fever.  States Urine does have an odor.  Given an appointment this am.

## 2023-08-12 ENCOUNTER — Other Ambulatory Visit: Payer: Self-pay | Admitting: Student

## 2023-08-12 LAB — HEMOGLOBIN A1C
Est. average glucose Bld gHb Est-mCnc: 126 mg/dL
Hgb A1c MFr Bld: 6 % — ABNORMAL HIGH (ref 4.8–5.6)

## 2023-08-12 MED ORDER — METHOCARBAMOL 750 MG PO TABS: 750.0000 mg | ORAL_TABLET | Freq: Three times a day (TID) | ORAL | 0 refills | Status: DC

## 2023-08-21 ENCOUNTER — Emergency Department (HOSPITAL_COMMUNITY): Payer: Medicare Other

## 2023-08-21 ENCOUNTER — Other Ambulatory Visit: Payer: Self-pay

## 2023-08-21 ENCOUNTER — Emergency Department (HOSPITAL_COMMUNITY)
Admission: EM | Admit: 2023-08-21 | Discharge: 2023-08-21 | Disposition: A | Discharge status: Home / Self Care | Payer: Medicare Other | Attending: Emergency Medicine | Admitting: Emergency Medicine

## 2023-08-21 ENCOUNTER — Encounter (HOSPITAL_COMMUNITY): Payer: Self-pay

## 2023-08-21 DIAGNOSIS — M5442 Lumbago with sciatica, left side: Secondary | ICD-10-CM | POA: Insufficient documentation

## 2023-08-21 DIAGNOSIS — E119 Type 2 diabetes mellitus without complications: Secondary | ICD-10-CM | POA: Insufficient documentation

## 2023-08-21 DIAGNOSIS — Z79899 Other long term (current) drug therapy: Secondary | ICD-10-CM | POA: Diagnosis not present

## 2023-08-21 DIAGNOSIS — M4807 Spinal stenosis, lumbosacral region: Secondary | ICD-10-CM | POA: Insufficient documentation

## 2023-08-21 DIAGNOSIS — M545 Low back pain, unspecified: Secondary | ICD-10-CM | POA: Diagnosis present

## 2023-08-21 DIAGNOSIS — I10 Essential (primary) hypertension: Secondary | ICD-10-CM | POA: Diagnosis not present

## 2023-08-21 DIAGNOSIS — Z853 Personal history of malignant neoplasm of breast: Secondary | ICD-10-CM | POA: Insufficient documentation

## 2023-08-21 DIAGNOSIS — Z7984 Long term (current) use of oral hypoglycemic drugs: Secondary | ICD-10-CM | POA: Diagnosis not present

## 2023-08-21 DIAGNOSIS — M5432 Sciatica, left side: Secondary | ICD-10-CM

## 2023-08-21 MED ORDER — ONDANSETRON HCL 4 MG/2ML IJ SOLN
4.0000 mg | Freq: Once | INTRAMUSCULAR | Status: AC
  Administered 2023-08-21: 4 mg via INTRAVENOUS
  Filled 2023-08-21: qty 2

## 2023-08-21 MED ORDER — GADOBUTROL 1 MMOL/ML IV SOLN
8.0000 mL | Freq: Once | INTRAVENOUS | Status: AC | PRN
  Administered 2023-08-21: 8 mL via INTRAVENOUS

## 2023-08-21 MED ORDER — OXYCODONE HCL 5 MG PO TABS
5.0000 mg | ORAL_TABLET | Freq: Four times a day (QID) | ORAL | 0 refills | Status: AC | PRN
Start: 2023-08-21 — End: 2023-08-26

## 2023-08-21 MED ORDER — MORPHINE SULFATE (PF) 4 MG/ML IV SOLN
4.0000 mg | Freq: Once | INTRAVENOUS | Status: AC
  Administered 2023-08-21: 4 mg via INTRAVENOUS
  Filled 2023-08-21: qty 1

## 2023-08-21 MED ORDER — CYCLOBENZAPRINE HCL 10 MG PO TABS
10.0000 mg | ORAL_TABLET | Freq: Three times a day (TID) | ORAL | 0 refills | Status: AC | PRN
Start: 2023-08-21 — End: 2023-08-26

## 2023-08-21 MED ORDER — DEXAMETHASONE SODIUM PHOSPHATE 10 MG/ML IJ SOLN
10.0000 mg | Freq: Once | INTRAMUSCULAR | Status: AC
  Administered 2023-08-21: 10 mg via INTRAVENOUS
  Filled 2023-08-21: qty 1

## 2023-08-21 MED ORDER — MORPHINE SULFATE (PF) 2 MG/ML IV SOLN
2.0000 mg | Freq: Once | INTRAVENOUS | Status: AC
  Administered 2023-08-21: 2 mg via INTRAVENOUS
  Filled 2023-08-21: qty 1

## 2023-08-21 NOTE — ED Notes (Signed)
Pt to MRI

## 2023-08-21 NOTE — ED Triage Notes (Signed)
Pt c/o left hip that radiates down to left kneex2d. Pt states the pain got worse yesterday. Pt denies injury. Pt c/o lower back painx1wk. Pt denies numbness and tingling.

## 2023-08-21 NOTE — ED Provider Notes (Signed)
[place] EMERGENCY DEPARTMENT AT [facility] Provider Note   CSN: [id number] Arrival date & time: 08/21/23  2952     History {Add pertinent medical, surgical, social history, OB history to HPI:1} Chief Complaint  Patient presents with   Hip Pain    Yvonne [name] is a 77 y.o. female with past medical history hypertension, type 2 diabetes she reports is well-controlled, chronic back pain who presents to the ED complaining of severe lower back pain that radiates down her left leg to her left knee for the last 2 days.  She states that though she does have chronic back pain which she manages this well with Robaxin and typically does not have major issues.  Her back pain over the last 2 days has been so severe that she has had difficulty with ambulation.  No bowel or bladder dysfunction, fever, urinary complaints, paresthesias.  No fall or injury to the back or leg.  Patient states that she recently underwent radical hysterectomy to treat uterine cancer.  It is believed that all of the cancer was able to be removed but she has not yet had her follow-up appointment.  Also notes that she has a history of breast cancer which is currently in remission.  No history of IV drug use.  No history of the radicular symptoms.      Home Medications Prior to Admission medications   Medication Sig Start Date End Date Taking? Authorizing Provider  amLODipine (NORVASC) 5 MG tablet Take 1 tablet (5 mg total) by mouth daily. 04/14/23 04/13/24  Merrilyn Puma, MD  diclofenac Sodium (VOLTAREN) 1 % GEL Apply 2 g topically every 8 (eight) hours as needed (apply to knee). 04/28/23   Doran Stabler, DO  glucose blood ([employer] GLUCOSE METER TEST STRIPS) test strip Use as instructed 01/29/23   Mapp, Gaylyn Cheers, MD  Lancets (FREESTYLE) lancets 1 each by Other route daily. 01/29/23   Mapp, Gaylyn Cheers, MD  metFORMIN (GLUCOPHAGE) 500 MG tablet Take 0.5 tablets (250 mg total) by mouth 2 (two) times daily with a meal. 01/01/23    Lyndle Herrlich, MD  methocarbamol (ROBAXIN-750) 750 MG tablet Take 1 tablet (750 mg total) by mouth 3 (three) times daily. 08/12/23 09/11/23  Lovie Macadamia, MD  rosuvastatin (CRESTOR) 40 MG tablet Take 1 tablet (40 mg total) by mouth daily. 01/01/23   Lyndle Herrlich, MD  senna-docusate (SENOKOT-S) 8.6-50 MG tablet Take 2 tablets by mouth at bedtime. For AFTER surgery, do not take if having diarrhea 04/29/23   Warner Mccreedy D, NP      Allergies    Penicillins    Review of Systems   Review of Systems  All other systems reviewed and are negative.   Physical Exam Updated Vital Signs BP (!) 149/83 (BP Location: Right Arm)   Pulse (!) 106   Temp 98.7 F (37.1 C) (Oral)   Resp 16   Ht 5\' 5"  (1.651 m)   Wt 84.3 kg   SpO2 97%   BMI 30.93 kg/m  Physical Exam Vitals and nursing note reviewed.  Constitutional:      General: She is not in acute distress.    Appearance: Normal appearance.  HENT:     Head: Normocephalic and atraumatic.     Mouth/Throat:     Mouth: Mucous membranes are moist.  Eyes:     Conjunctiva/sclera: Conjunctivae normal.  Cardiovascular:     Rate and Rhythm: Normal rate and regular rhythm.  Pulmonary:     Effort: Pulmonary effort  is normal.     Breath sounds: Normal breath sounds.  Abdominal:     General: Abdomen is flat. There is no distension.     Palpations: Abdomen is soft.     Tenderness: There is no abdominal tenderness. There is no guarding or rebound.  Musculoskeletal:     Cervical back: Normal range of motion and neck supple. No rigidity.     Right lower leg: No edema.     Left lower leg: No edema.     Comments: Severe midline tenderness around L4-L5, no paraspinous muscle tenderness, difficulty with changing positions secondary to severe pain, 5/5 strength to the bilateral lower extremities  Skin:    General: Skin is warm and dry.     Capillary Refill: Capillary refill takes less than 2 seconds.  Neurological:     General: No  focal deficit present.     Mental Status: She is alert and oriented to person, place, and time.     Sensory: Sensation is intact.     Deep Tendon Reflexes:     Reflex Scores:      Patellar reflexes are 2+ on the right side and 2+ on the left side. Psychiatric:        Behavior: Behavior normal.     ED Results / Procedures / Treatments   Labs (all labs ordered are listed, but only abnormal results are displayed) Labs Reviewed - No data to display  EKG None  Radiology DG Hip Unilat W or Wo Pelvis 2-3 Views Left  Result Date: 08/21/2023 CLINICAL DATA:  Left hip pain for 2 days.  No known injury. EXAM: DG HIP (WITH OR WITHOUT PELVIS) 2-3V LEFT COMPARISON:  None Available. FINDINGS: Mildly decreased bone mineralization. Moderate bilateral femoroacetabular joint space narrowing. There is asymmetric widening of the left femoral neck compared to the right which may represent the sequela of a remote healed fracture. Mild-to-moderate left and moderate right superolateral acetabular degenerative osteophytes. Mild left and moderate right femoral head-neck junction circumferential degenerative osteophytes. Moderate to severe pubic symphysis joint space narrowing with subchondral sclerosis and mild peripheral osteophytosis. Moderate bilateral sacroiliac joint space narrowing and subchondral sclerosis. Two oval calcifications passive representing the skull remote soft tissue trauma or vascular phleboliths overlie the anterolateral proximal left thigh. IMPRESSION: 1. Moderate bilateral femoroacetabular osteoarthritis. No acute fracture is seen. 2. 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 3. Mild-to-moderate bilateral sacroiliac osteoarthritis. Electronically Signed   By: Neita Garnet M.D.   On: 08/21/2023 10:40   DG Lumbar Spine Complete  Result Date: 08/21/2023 CLINICAL DATA:  Lower back and left hip pain for 2 days. EXAM: LUMBAR SPINE - COMPLETE 4+ VIEW COMPARISON:  Correlation with chest  radiographs 11/30/2022 FINDINGS: There appears to be transitional lumbosacral anatomy. Correlating with chest radiographs 11/30/2022, there appear to be tiny ribs at T12 vertebral body. The next 5 vertebral bodies are considered L1 through L5. The L3-4 disc space overlies the iliac crests on frontal and lateral view. There is partial sacralization of L5. Mild levocurvature centered at L3. Mild right L2-3 disc space narrowing on frontal view. Vertebral body heights appear maintained. Mild anterior T10-11 creatinine T11-12 endplate osteophytes. Mild to moderate posterior L2-3, moderate to severe posterior L3-4, and moderate L4-5 and L5-S1 disc space narrowing and endplate sclerosis. Moderate to severe L2-3 through L5-S1 facet joint hypertrophy and sclerosis, increasing in severity inferiorly. No pars defect is seen. Moderate atherosclerotic calcifications. IMPRESSION: 1. Transitional lumbosacral anatomy. 2. Moderate to severe L3-4 through L5-S1 degenerative disc  and endplate changes. 3. Moderate to severe L2-3 through L5-S1 facet joint osteoarthritis. Electronically Signed   By: Neita Garnet M.D.   On: 08/21/2023 10:37    Procedures Procedures  {Document cardiac monitor, telemetry assessment procedure when appropriate:1}  Medications Ordered in ED Medications  dexamethasone (DECADRON) injection 10 mg (10 mg Intravenous Given 08/21/23 1121)  morphine (PF) 4 MG/ML injection 4 mg (4 mg Intravenous Given 08/21/23 1121)  ondansetron (ZOFRAN) injection 4 mg (4 mg Intravenous Given 08/21/23 1121)    ED Course/ Medical Decision Making/ A&P   {   Click here for ABCD2, HEART and other calculatorsREFRESH Note before signing :1}                              Medical Decision Making Amount and/or Complexity of Data Reviewed Radiology: ordered. Decision-making details documented in ED Course.  Risk Prescription drug management.   Medical Decision Making:   CAMBELL CLOPTON is a 77 y.o. female who presented  to the ED today with back pain detailed above.    Additional history discussed with patient's family/caregivers.  Patient's presentation is complicated by their history of hypertension, diabetes, malignancy.  Complete initial physical exam performed, notably the patient  was in no acute distress when at rest.  She did have significant tenderness to the L-spine but no step-offs or deformities.  She had no focal deficits.    Reviewed and confirmed nursing documentation for past medical history, family history, social history.    Initial Assessment:   With the patient's presentation of back pain, the emergent differential diagnosis for back pain includes but is not limited to fracture, muscle strain, cauda equina, spinal stenosis, DDD, ankylosing spondylitis, acute ligamentous injury, disk herniation, spondylolisthesis, epidural compression syndrome, metastatic cancer, transverse myelitis, vertebral osteomyelitis, diskitis, kidney stone, pyelonephritis, AAA, Perforated ulcer, retrocecal appendicitis, pancreatitis, bowel obstruction, retroperitoneal hemorrhage or mass, meningitis.   Initial Plan:  MRI T/L spine given malignancy history Symptomatic management Objective evaluation as reviewed   Initial Study Results:   Radiology:  All images reviewed independently. Agree with radiology report at this time.   DG Hip Unilat W or Wo Pelvis 2-3 Views Left  Result Date: 08/21/2023 CLINICAL DATA:  Left hip pain for 2 days.  No known injury. EXAM: DG HIP (WITH OR WITHOUT PELVIS) 2-3V LEFT COMPARISON:  None Available. FINDINGS: Mildly decreased bone mineralization. Moderate bilateral femoroacetabular joint space narrowing. There is asymmetric widening of the left femoral neck compared to the right which may represent the sequela of a remote healed fracture. Mild-to-moderate left and moderate right superolateral acetabular degenerative osteophytes. Mild left and moderate right femoral head-neck junction  circumferential degenerative osteophytes. Moderate to severe pubic symphysis joint space narrowing with subchondral sclerosis and mild peripheral osteophytosis. Moderate bilateral sacroiliac joint space narrowing and subchondral sclerosis. Two oval calcifications passive representing the skull remote soft tissue trauma or vascular phleboliths overlie the anterolateral proximal left thigh. IMPRESSION: 1. Moderate bilateral femoroacetabular osteoarthritis. No acute fracture is seen. 2. 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 3. Mild-to-moderate bilateral sacroiliac osteoarthritis. Electronically Signed   By: Neita Garnet M.D.   On: 08/21/2023 10:40   DG Lumbar Spine Complete  Result Date: 08/21/2023 CLINICAL DATA:  Lower back and left hip pain for 2 days. EXAM: LUMBAR SPINE - COMPLETE 4+ VIEW COMPARISON:  Correlation with chest radiographs 11/30/2022 FINDINGS: There appears to be transitional lumbosacral anatomy. Correlating with chest radiographs 11/30/2022, there appear to  be tiny ribs at T12 vertebral body. The next 5 vertebral bodies are considered L1 through L5. The L3-4 disc space overlies the iliac crests on frontal and lateral view. There is partial sacralization of L5. Mild levocurvature centered at L3. Mild right L2-3 disc space narrowing on frontal view. Vertebral body heights appear maintained. Mild anterior T10-11 creatinine T11-12 endplate osteophytes. Mild to moderate posterior L2-3, moderate to severe posterior L3-4, and moderate L4-5 and L5-S1 disc space narrowing and endplate sclerosis. Moderate to severe L2-3 through L5-S1 facet joint hypertrophy and sclerosis, increasing in severity inferiorly. No pars defect is seen. Moderate atherosclerotic calcifications. IMPRESSION: 1. Transitional lumbosacral anatomy. 2. Moderate to severe L3-4 through L5-S1 degenerative disc and endplate changes. 3. Moderate to severe L2-3 through L5-S1 facet joint osteoarthritis. Electronically Signed   By:  Neita Garnet M.D.   On: 08/21/2023 10:37      Consults: Case discussed with ***.   Final Assessment and Plan:        Clinical Impression: No diagnosis found.   Data Unavailable    {Document critical care time when appropriate:1} {Document review of labs and clinical decision tools ie heart score, Chads2Vasc2 etc:1}  {Document your independent review of radiology images, and any outside records:1} {Document your discussion with family members, caretakers, and with consultants:1} {Document social determinants of health affecting pt's care:1} {Document your decision making why or why not admission, treatments were needed:1} Final Clinical Impression(s) / ED Diagnoses Final diagnoses:  None    Rx / DC Orders ED Discharge Orders     None

## 2023-08-21 NOTE — ED Notes (Signed)
Pt refused wheelchair offered by lobby staff

## 2023-08-21 NOTE — Discharge Instructions (Signed)
Thank you for letting us take care of you today.  Your scans show a condition called spinal stenosis which is likely causing your sciatic pain. We typically treat this with muscle relaxers and steroids, however, will avoid multiple doses of steroids with your history of diabetes. Stop your Robaxin. I prescribed a muscle relaxer which works better for some but you should not take both. I am also prescribing a narcotic pain medication to help with your pain. I recommend taking Tylenol 1000 mg every 6 hours as her first-line for pain.  If you continue to have severe, breakthrough pain, you may take the narcotic pain medication but should do so sparingly.  Please be aware that both of these medications, the Flexeril and the oxycodone, can cause confusion and put you at an increased risk for falls.  I recommend taking them when someone is around to monitor you and pain close attention the side effects after you take the first few doses.  If you have serious side effects, discontinue the medication.  Follow-up with your PCP next week to discuss any continued symptoms.  I am also referring to neurosurgery as sometimes they will do procedures to help with the symptoms.  You may also follow-up with orthopedics who also commonly treat this condition.  For new or worsening symptoms, return to the nearest ED for reevaluation.

## 2023-08-24 NOTE — Progress Notes (Signed)
Internal Medicine Clinic Attending  I was physically present during the key portions of the resident provided service and participated in the medical decision making of patient's management care. I reviewed pertinent patient test results.  The assessment, diagnosis, and plan were formulated together and I agree with the documentation in the resident's note.  Gust Rung, DO

## 2023-08-24 NOTE — Addendum Note (Signed)
Addended by: Carlynn Purl C on: 08/24/2023 02:47 PM   Modules accepted: Level of Service

## 2023-08-27 ENCOUNTER — Other Ambulatory Visit (HOSPITAL_COMMUNITY): Payer: Self-pay

## 2023-08-27 NOTE — Telephone Encounter (Signed)
Reached out to [place] regarding application status.   Letter was sent to patient from company (07/07/23 & 07/12/23) regarding other options for assistance. According to their benefits verification, patients insurance covers the medication and patient should try to enroll with Xarelto[employer]Me (an online site that charges patients $80 a month for their RX). If this isnt an affordable option for patient, she can reach back out to company to move forward with assistance.    I called patient with this info. Her copay is $47 for a 30 day supply of medication. Pt agreed for me to send her an email about the Low Income Subsidy with [employer]. Pt will reach back out to me if denied.

## 2023-08-31 ENCOUNTER — Ambulatory Visit (INDEPENDENT_AMBULATORY_CARE_PROVIDER_SITE_OTHER): Payer: Medicare Other | Admitting: Student

## 2023-08-31 ENCOUNTER — Encounter: Payer: Self-pay | Admitting: Student

## 2023-08-31 ENCOUNTER — Other Ambulatory Visit: Payer: Self-pay

## 2023-08-31 VITALS — BP 158/88 | HR 70 | Temp 98.2°F | Resp 19 | Ht 65.0 in | Wt 181.3 lb

## 2023-08-31 DIAGNOSIS — E1129 Type 2 diabetes mellitus with other diabetic kidney complication: Secondary | ICD-10-CM

## 2023-08-31 DIAGNOSIS — I159 Secondary hypertension, unspecified: Secondary | ICD-10-CM

## 2023-08-31 DIAGNOSIS — E1169 Type 2 diabetes mellitus with other specified complication: Secondary | ICD-10-CM

## 2023-08-31 DIAGNOSIS — E785 Hyperlipidemia, unspecified: Secondary | ICD-10-CM | POA: Diagnosis not present

## 2023-08-31 DIAGNOSIS — R809 Proteinuria, unspecified: Secondary | ICD-10-CM

## 2023-08-31 DIAGNOSIS — I10 Essential (primary) hypertension: Secondary | ICD-10-CM

## 2023-08-31 DIAGNOSIS — M1710 Unilateral primary osteoarthritis, unspecified knee: Secondary | ICD-10-CM

## 2023-08-31 DIAGNOSIS — Z86718 Personal history of other venous thrombosis and embolism: Secondary | ICD-10-CM

## 2023-08-31 DIAGNOSIS — M79605 Pain in left leg: Secondary | ICD-10-CM

## 2023-08-31 MED ORDER — DICLOFENAC SODIUM 1 % EX GEL: 2.0000 g | Freq: Three times a day (TID) | CUTANEOUS | 1 refills | Status: DC | PRN

## 2023-08-31 MED ORDER — RIVAROXABAN (XARELTO) VTE STARTER PACK (15 & 20 MG): ORAL_TABLET | ORAL | 0 refills | Status: DC

## 2023-08-31 MED ORDER — ROSUVASTATIN CALCIUM 40 MG PO TABS: 40.0000 mg | ORAL_TABLET | Freq: Every day | ORAL | 3 refills | Status: DC

## 2023-08-31 MED ORDER — EMPAGLIFLOZIN 10 MG PO TABS: 10.0000 mg | ORAL_TABLET | Freq: Every day | ORAL | 3 refills | Status: DC

## 2023-08-31 MED ORDER — LOSARTAN POTASSIUM 50 MG PO TABS: 50.0000 mg | ORAL_TABLET | Freq: Every day | ORAL | 3 refills | Status: DC

## 2023-08-31 NOTE — Patient Instructions (Addendum)
Thank you so much for coming to the clinic today!   A medication for your diabetes called Jardiance to your regimen.  We are also going to increase the dose of the losartan to 50 mg.  Please call the clinic if you have any trouble obtaining your medications.  If you have any questions please feel free to the call the clinic at anytime at [phone]. It was a pleasure seeing you!  Best, Dr. Thomasene Ripple

## 2023-09-01 DIAGNOSIS — G5712 Meralgia paresthetica, left lower limb: Secondary | ICD-10-CM

## 2023-09-01 DIAGNOSIS — M79605 Pain in left leg: Secondary | ICD-10-CM | POA: Insufficient documentation

## 2023-09-01 HISTORY — DX: Meralgia paresthetica, left lower limb: G57.12

## 2023-09-01 NOTE — Assessment & Plan Note (Signed)
>>  ASSESSMENT AND PLAN FOR HISTORY OF DVT (DEEP VEIN THROMBOSIS) WRITTEN ON 09/01/2023  6:51 AM BY NOORUDDIN, SAAD, MD  Patient has been taking Xarelto  for history of provoked and unprovoked DVTs back in 2021.  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  She has follow-up with hematology oncology on September 02, 2023 for evaluation of her endometrial cancer, even being status post hysterectomy with bilateral salpingo-oophorectomy.  I do think that she needs to be on anticoagulation as she has not been on any for the last couple months.  Will reinitiate with a Xarelto  starter pack.  Plan: - Xarelto  starter pack

## 2023-09-01 NOTE — Progress Notes (Signed)
Internal Medicine Clinic Attending  Case discussed with the resident at the time of the visit.  We reviewed the resident's history and exam and pertinent patient test results.  I agree with the assessment, diagnosis, and plan of care documented in the resident's note.  

## 2023-09-01 NOTE — Assessment & Plan Note (Signed)
Refilled atorvastatin 40 mg

## 2023-09-01 NOTE — Assessment & Plan Note (Signed)
Patient states she has been having a week history of throbbing pain in her left lower extremity.  The pain is located in her thigh on the anterior side of the leg.  She denies any exacerbating factors or inducing factors.  She has not tried any medications for it.  Low concern for clot at this time in that area, I do think this is most likely MSK related.  It is currently not affecting her day-to-day life.  Will trial Voltaren gel for her.  Plan: - Voltaren gel

## 2023-09-01 NOTE — Assessment & Plan Note (Signed)
Patient has been taking Xarelto for history of provoked and unprovoked DVTs back in 2021.  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  She has follow-up with hematology oncology on September 02, 2023 for evaluation of her endometrial cancer, even being status post hysterectomy with bilateral salpingo-oophorectomy.  I do think that she needs to be on anticoagulation as she has not been on any for the last couple months.  Will reinitiate with a Xarelto starter pack.  Plan: - Xarelto starter pack

## 2023-09-01 NOTE — Progress Notes (Signed)
CC: Checkup  HPI:  [name] is a 77 y.o. female living with a history stated below and presents today for checkup. Please see problem based assessment and plan for additional details.  Past Medical History:  Diagnosis Date   Acute deep vein thrombosis (DVT) of popliteal vein of right lower extremity (HCC) 09/12/2020   AKI (acute kidney injury) (HCC) 02/09/2013   Arthritis    Breast cancer (HCC) 04/2012   right breast   Bruises easily    Cancer (HCC) 03/25/2012   right breast   Chronic back pain    arthritis   Diabetes mellitus without complication (HCC)    Elevated cholesterol    takes Crestor daily   Hemorrhoids    History of chemotherapy    x 1, pt unable to tolerate, on PO chemotherpay   Hypertension    takes Maxzide daily    Phlebitis 20yrs ago   hx of-   Seasonal allergies    takes Zyrtec daily and Nasal Spray prn    Current Outpatient Medications on File Prior to Visit  Medication Sig Dispense Refill   amLODipine (NORVASC) 5 MG tablet Take 1 tablet (5 mg total) by mouth daily. 30 tablet 2   glucose blood ([employer] GLUCOSE METER TEST STRIPS) test strip Use as instructed 100 each 12   Lancets (FREESTYLE) lancets 1 each by Other route daily. 100 each 2   metFORMIN (GLUCOPHAGE) 500 MG tablet Take 0.5 tablets (250 mg total) by mouth 2 (two) times daily with a meal. 90 tablet 3   No current facility-administered medications on file prior to visit.    Family History  Problem Relation Age of Onset   Cancer Mother        ovarian cancer vs uterine   Leukemia Father    Cancer Father        leukemia   Cancer Sister 50       colon cancer   Colon cancer Sister    Cancer Brother 36       colon cancer   Colon cancer Brother    Cancer Brother        prostate cancer, deceased   Anesthesia problems Neg Hx    Hypotension Neg Hx    Malignant hyperthermia Neg Hx    Pseudochol deficiency Neg Hx    Breast cancer Neg Hx    Ovarian cancer Neg Hx    Endometrial  cancer Neg Hx    Pancreatic cancer Neg Hx    Prostate cancer Neg Hx     Social History   Socioeconomic History   Marital status: Married    Spouse name: Not on file   Number of children: 5   Years of education: Not on file   Highest education level: Not on file  Occupational History   Not on file  Tobacco Use   Smoking status: Former    Current packs/day: 0.00    Types: Cigarettes    Quit date: 01/10/1984    Years since quitting: 39.6   Smokeless tobacco: Never   Tobacco comments:    quit 30+yrs ago  Vaping Use   Vaping status: Never Used  Substance and Sexual Activity   Alcohol use: No   Drug use: No   Sexual activity: Yes    Birth control/protection: Post-menopausal    Comment: menarche age 29, P5,, 1st pregancy 69, menop 64, no HRT  Other Topics Concern   Not on file  Social History Narrative   Not  on file   Social Determinants of Health   Financial Resource Strain: Medium Risk (05/31/2023)   Overall Financial Resource Strain (CARDIA)    Difficulty of Paying Living Expenses: Somewhat hard  Food Insecurity: No Food Insecurity (05/31/2023)   Hunger Vital Sign    Worried About Running Out of Food in the Last Year: Never true    Ran Out of Food in the Last Year: Never true  Transportation Needs: No Transportation Needs (05/31/2023)   PRAPARE - Administrator, Civil Service (Medical): No    Lack of Transportation (Non-Medical): No  Physical Activity: Inactive (05/31/2023)   Exercise Vital Sign    Days of Exercise per Week: 0 days    Minutes of Exercise per Session: 0 min  Stress: No Stress Concern Present (05/31/2023)   [employer] of Occupational Health - Occupational Stress Questionnaire    Feeling of Stress : Only a little  Social Connections: Socially Integrated (05/31/2023)   Social Connection and Isolation Panel [NHANES]    Frequency of Communication with Friends and Family: More than three times a week    Frequency of Social Gatherings with  Friends and Family: More than three times a week    Attends Religious Services: More than 4 times per year    Active Member of [employer] or Organizations: Yes    Attends Banker Meetings: Never    Marital Status: Married  Catering manager Violence: Not At Risk (05/31/2023)   Humiliation, Afraid, Rape, and Kick questionnaire    Fear of Current or Ex-Partner: No    Emotionally Abused: No    Physically Abused: No    Sexually Abused: No    Review of Systems: ROS negative except for what is noted on the assessment and plan.  Vitals:   08/31/23 0925 08/31/23 0949  BP: (!) 180/84 (!) 158/88  Pulse: 75 70  Resp: 19   Temp: 98.2 F (36.8 C)   TempSrc: Oral   SpO2: 96%   Weight: 181 lb 4.8 oz (82.2 kg)   Height: 5\' 5"  (1.651 m)     Physical Exam: Constitutional: well-appearing female in no acute distress Cardiovascular: regular rate and rhythm, no m/r/g Pulmonary/Chest: normal work of breathing on room air, lungs clear to auscultation bilaterally MSK: normal bulk and tone, mild tenderness to palpation in the left lower extremity, uses cane for ambulation at baseline   Assessment & Plan:   Hypertension Patient does have Medicaid at this time, she has only been taking losartan 25 mg for her blood pressure.  Blood pressure elevated today 158/88.  Given her significant proteinuria on her last microalbumin examination, we will increase losartan to 50 mg.  Even though she does have insurance, she still seemed wary about medication prices.  Informed patient that if there is problems that the pharmacy receiving any of her medications, to call the clinic.  Plan: - Losartan 50 mg - If blood pressure still elevated at next visit in a month can consider addition of amlodipine  Type 2 diabetes mellitus with proteinuria (HCC) A1c currently 6.0, only medication she is taking for her diabetes is metformin 250 mg twice a day.  She does have significant proteinuria on her urine analysis,  and I think she would benefit from an SGLT2.  She does not have increased urine frequency at this time.  Will initiate.  Attempted to recheck microalbumin/creatinine ratio however patient was in a rush and left before sample could have been collected.  Plan: -  Continue metformin 250 mg twice a day - Jardiance 10 mg, can consider titrating up at next visit - Collect urine microalbumin at next visit  Hyperlipidemia Refilled atorvastatin 40 mg  History of DVT (deep vein thrombosis) Patient has been taking Xarelto for history of provoked and unprovoked DVTs back in 2021.  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  She has follow-up with hematology oncology on September 02, 2023 for evaluation of her endometrial cancer, even being status post hysterectomy with bilateral salpingo-oophorectomy.  I do think that she needs to be on anticoagulation as she has not been on any for the last couple months.  Will reinitiate with a Xarelto starter pack.  Plan: - Xarelto starter pack  Left leg pain Patient states she has been having a week history of throbbing pain in her left lower extremity.  The pain is located in her thigh on the anterior side of the leg.  She denies any exacerbating factors or inducing factors.  She has not tried any medications for it.  Low concern for clot at this time in that area, I do think this is most likely MSK related.  It is currently not affecting her day-to-day life.  Will trial Voltaren gel for her.  Plan: - Voltaren gel  Patient discussed with Dr. Dewain Penning Meriah Shands, M.D. [facility], PGY-2 Pager: [phone] Date 09/01/2023 Time 6:54 AM

## 2023-09-01 NOTE — Assessment & Plan Note (Signed)
>>  ASSESSMENT AND PLAN FOR LEFT LEG PAIN WRITTEN ON 09/01/2023  6:53 AM BY NOORUDDIN, SAAD, MD  Patient states she has been having a week history of throbbing pain in her left lower extremity.  The pain is located in her thigh on the anterior side of the leg.  She denies any exacerbating factors or inducing factors.  She has not tried any medications for it.  Low concern for clot at this time in that area, I do think this is most likely MSK related.  It is currently not affecting her day-to-day life.  Will trial Voltaren  gel for her.  Plan: - Voltaren  gel

## 2023-09-01 NOTE — Assessment & Plan Note (Addendum)
Patient does have Medicaid at this time, she has only been taking losartan 25 mg for her blood pressure.  Blood pressure elevated today 158/88.  Given her significant proteinuria on her last microalbumin examination, we will increase losartan to 50 mg.  Even though she does have insurance, she still seemed wary about medication prices.  Informed patient that if there is problems that the pharmacy receiving any of her medications, to call the clinic.  Plan: - Losartan 50 mg - If blood pressure still elevated at next visit in a month can consider addition of amlodipine

## 2023-09-01 NOTE — Assessment & Plan Note (Addendum)
A1c currently 6.0, only medication she is taking for her diabetes is metformin 250 mg twice a day.  She does have significant proteinuria on her urine analysis, and I think she would benefit from an SGLT2.  She does not have increased urine frequency at this time.  Will initiate.  Attempted to recheck microalbumin/creatinine ratio however patient was in a rush and left before sample could have been collected.  Plan: - Continue metformin 250 mg twice a day - Jardiance 10 mg, can consider titrating up at next visit - Collect urine microalbumin at next visit

## 2023-09-02 NOTE — Progress Notes (Addendum)
Gynecologic Oncology Return Clinic Visit  09/03/23  Reason for Visit: surveillance  Treatment History: She presented to the ED with vaginal bleeding and hematochezia after recently starting Xarelto. She was seen for outpatient follow-up by OBGYN after pelvic ultrasound during her short hospitalization showed a uterus measuring 9 x 5 x 6 cm with a large central 6.2 cm endometrial vs myometrial mass. 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 The patient was started on 10 mg Provera at her follow-up visit on 5/6.    She has a history of breast cancer, provoked DVT (and possible a PE at the time of a pregnancy) and an unprovoked DVT in 2021. Work up revealed positive lupus anticoagulant.  She had initially been on Eliquis, was transition to Xarelto.  She was on prophylactic dosing until recently around the time of her vaginal bleeding.  Lower extremity Doppler in late April was negative for DVT on the right.  In terms of her breast cancer, this was stage IIb invasive ductal carcinoma, grade 2, ER/PR positive, HER2 negative.  She underwent modified radical mastectomy in 2013 followed by 1 cycle of adjuvant chemotherapy.  She declined further chemotherapy as well as radiation.  She was treated with anastrozole for 5 years, completed in late 2018.  In terms of her type 2 diabetes, the patient takes metformin twice daily.  Blood glucose at home is typically between 100-110.  Her last hemoglobin A1c in early April was 5.9%.   05/05/23: Robotic-assisted laparoscopic total hysterectomy with bilateral salpingoophorectomy, SLN biopsy, umbilical hernia repair  Path: grade 2, endometrioid, 95% MI, +LVI (>5 foci), negative SLNs MMRp, p53 WT  Interval History: Doing well.  Had some left knee and hip pain recently.  Was seen in the emergency department, had MRI of her thoracic and lumbar spine which showed no bony lesions.  Notes had some central abdominal pain about a week ago, mild.  Otherwise  denies any abdominal or pelvic pain.  Denies any vaginal bleeding.  Reports bowel and bladder function are at baseline.  Past Medical/Surgical History: Past Medical History:  Diagnosis Date   Acute deep vein thrombosis (DVT) of popliteal vein of right lower extremity (HCC) 09/12/2020   AKI (acute kidney injury) (HCC) 02/09/2013   Arthritis    Breast cancer (HCC) 04/2012   right breast   Bruises easily    Cancer (HCC) 03/25/2012   right breast   Chronic back pain    arthritis   Diabetes mellitus without complication (HCC)    Elevated cholesterol    takes Crestor daily   Hemorrhoids    History of chemotherapy    x 1, pt unable to tolerate, on PO chemotherpay   Hypertension    takes Maxzide daily    Phlebitis 77yrs ago   hx of-   Seasonal allergies    takes Zyrtec daily and Nasal Spray prn    Past Surgical History:  Procedure Laterality Date   APPENDECTOMY  79yrs ago   BREAST EXCISIONAL BIOPSY Left    BREAST SURGERY  04/27/12   Right mod mastectomy, ER/PR +, HER2 -   BREAST SURGERY  04/27/12   Left Breast NL lumpectomy-no malignancy   COLONOSCOPY     MASTECTOMY Right 2013   PORT-A-CATH REMOVAL  11/24/2012   Procedure: REMOVAL PORT-A-CATH;  Surgeon: [name], DO;  Location: [facility];  Service: General;  Laterality: Left;   PORTACATH PLACEMENT  04/26/2012   Procedure: INSERTION PORT-A-CATH;  Surgeon: [name], DO;  Location:  [facility];  Service: General;  Laterality: Left;  Started at 1746.   ROBOTIC ASSISTED TOTAL HYSTERECTOMY WITH BILATERAL SALPINGO OOPHERECTOMY N/A 05/05/2023   Procedure: XI ROBOTIC ASSISTED TOTAL HYSTERECTOMY WITH BILATERAL SALPINGO OOPHORECTOMY;  Surgeon: Carver Fila, MD;  Location: [facility];  Service: Gynecology;  Laterality: N/A;   SENTINEL NODE BIOPSY N/A 05/05/2023   Procedure: SENTINEL NODE BIOPSY;  Surgeon: Carver Fila, MD;  Location: [facility];  Service: Gynecology;  Laterality: N/A;   TUBAL LIGATION  66yrs ago   UMBILICAL  HERNIA REPAIR N/A 05/05/2023   Procedure: HERNIA REPAIR UMBILICAL ADULT;  Surgeon: Carver Fila, MD;  Location: [facility];  Service: Gynecology;  Laterality: N/A;    Family History  Problem Relation Age of Onset   Cancer Mother        ovarian cancer vs uterine   Leukemia Father    Cancer Father        leukemia   Cancer Sister 10       colon cancer   Colon cancer Sister    Cancer Brother 16       colon cancer   Colon cancer Brother    Cancer Brother        prostate cancer, deceased   Anesthesia problems Neg Hx    Hypotension Neg Hx    Malignant hyperthermia Neg Hx    Pseudochol deficiency Neg Hx    Breast cancer Neg Hx    Ovarian cancer Neg Hx    Endometrial cancer Neg Hx    Pancreatic cancer Neg Hx    Prostate cancer Neg Hx     Social History   Socioeconomic History   Marital status: Married    Spouse name: Not on file   Number of children: 5   Years of education: Not on file   Highest education level: Not on file  Occupational History   Not on file  Tobacco Use   Smoking status: Former    Current packs/day: 0.00    Types: Cigarettes    Quit date: 01/10/1984    Years since quitting: 39.6   Smokeless tobacco: Never   Tobacco comments:    quit 30+yrs ago  Vaping Use   Vaping status: Never Used  Substance and Sexual Activity   Alcohol use: No   Drug use: No   Sexual activity: Yes    Birth control/protection: Post-menopausal    Comment: menarche age 52, P5,, 1st pregancy 44, menop 69, no HRT  Other Topics Concern   Not on file  Social History Narrative   Not on file   Social Determinants of Health   Financial Resource Strain: Medium Risk (05/31/2023)   Overall Financial Resource Strain (CARDIA)    Difficulty of Paying Living Expenses: Somewhat hard  Food Insecurity: No Food Insecurity (05/31/2023)   Hunger Vital Sign    Worried About Running Out of Food in the Last Year: Never true    Ran Out of Food in the Last Year: Never true  Transportation  Needs: No Transportation Needs (05/31/2023)   PRAPARE - Administrator, Civil Service (Medical): No    Lack of Transportation (Non-Medical): No  Physical Activity: Inactive (05/31/2023)   Exercise Vital Sign    Days of Exercise per Week: 0 days    Minutes of Exercise per Session: 0 min  Stress: No Stress Concern Present (05/31/2023)   [employer] of Occupational Health - Occupational Stress Questionnaire    Feeling of Stress : Only a  little  Social Connections: Socially Integrated (05/31/2023)   Social Connection and Isolation Panel [NHANES]    Frequency of Communication with Friends and Family: More than three times a week    Frequency of Social Gatherings with Friends and Family: More than three times a week    Attends Religious Services: More than 4 times per year    Active Member of [employer] or Organizations: Yes    Attends Banker Meetings: Never    Marital Status: Married    Current Medications:  Current Outpatient Medications:    amLODipine (NORVASC) 5 MG tablet, Take 1 tablet (5 mg total) by mouth daily., Disp: 30 tablet, Rfl: 2   diclofenac Sodium (VOLTAREN) 1 % GEL, Apply 2 g topically every 8 (eight) hours as needed (apply to knee)., Disp: 100 g, Rfl: 1   empagliflozin (JARDIANCE) 10 MG TABS tablet, Take 1 tablet (10 mg total) by mouth daily before breakfast., Disp: 90 tablet, Rfl: 3   glucose blood ([employer] GLUCOSE METER TEST STRIPS) test strip, Use as instructed, Disp: 100 each, Rfl: 12   Lancets (FREESTYLE) lancets, 1 each by Other route daily., Disp: 100 each, Rfl: 2   losartan (COZAAR) 50 MG tablet, Take 1 tablet (50 mg total) by mouth daily., Disp: 90 tablet, Rfl: 3   metFORMIN (GLUCOPHAGE) 500 MG tablet, Take 0.5 tablets (250 mg total) by mouth 2 (two) times daily with a meal., Disp: 90 tablet, Rfl: 3   RIVAROXABAN (XARELTO) VTE STARTER PACK (15 & 20 MG), Follow package directions: Take one 15mg  tablet by mouth twice a day. On day 22, switch to one  20mg  tablet once a day. Take with food., Disp: 51 each, Rfl: 0   rosuvastatin (CRESTOR) 40 MG tablet, Take 1 tablet (40 mg total) by mouth daily., Disp: 90 tablet, Rfl: 3  Review of Systems: Denies appetite changes, fevers, chills, fatigue, unexplained weight changes. Denies hearing loss, neck lumps or masses, mouth sores, ringing in ears or voice changes. Denies cough or wheezing.  Denies shortness of breath. Denies chest pain or palpitations. Denies leg swelling. Denies abdominal distention, pain, blood in stools, constipation, diarrhea, nausea, vomiting, or early satiety. Denies pain with intercourse, dysuria, frequency, hematuria or incontinence. Denies hot flashes, pelvic pain, vaginal bleeding or vaginal discharge.   Denies joint pain, back pain or muscle pain/cramps. Denies itching, rash, or wounds. Denies dizziness, headaches, numbness or seizures. Denies swollen lymph nodes or glands, denies easy bruising or bleeding. Denies anxiety, depression, confusion, or decreased concentration.  Physical Exam: BP (!) 183/84 (BP Location: Left Arm, Patient Position: Sitting) Comment: informed Dr Pricilla Holm.;  patient states she has new meds from PCP for BP  Pulse 70   Temp 98.7 F (37.1 C) (Oral)   Resp 18   Wt 182 lb 6.4 oz (82.7 kg)   SpO2 97%   BMI 30.35 kg/m  General: Alert, oriented, no acute distress. HEENT: Normocephalic, atraumatic, sclera anicteric. Chest: Clear to auscultation bilaterally.  No wheezes or rhonchi. Cardiovascular: Regular rate and rhythm, no murmurs. Abdomen: soft, nontender.  Normoactive bowel sounds.  No masses or hepatosplenomegaly appreciated.  Well-healed incisions. Extremities: Grossly normal range of motion.  Warm, well perfused.  No edema bilaterally. Skin: No rashes or lesions noted. Lymphatics: No cervical, supraclavicular, or inguinal adenopathy. GU: Normal appearing external genitalia without erythema, excoriation, or lesions.  Speculum exam reveals  moderately atrophic vaginal mucosa, cuff intact, no visible lesions, no bladder discharge.  Bimanual exam reveals cuff intact, no nodularity or masses.  Rectovaginal exam confirms these findings.  Laboratory & Radiologic Studies: None new  Assessment & Plan: Yvonne [name] is a 77 y.o. 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 95% MI, LVI+. MMR intact. P53 WT. ER+. Adjuvant radiation was recommended given high risk features, patient declined.   Patient is overall doing well and is NED on exam today.  Given high risk features, would recommend continued visits every 3 months.  Will consider CT scan at her next visit given high risk disease and decision to defer adjuvant treatment. We reviewed signs and symptoms that would be concerning for cancer recurrence, and I stressed the importance of calling if she develops any of these.  The patient's blood pressure was quite elevated today.  This was checked with a cuff twice. Manual blood pressure was 158/92.  Patient was asymptomatic.  Recently saw her primary care provider earlier this week at which time her blood pressure medication was increased.  20 minutes of total time was spent for this patient encounter, including preparation, face-to-face counseling with the patient and coordination of care, and documentation of the encounter.  Eugene Garnet, MD  Division of Gynecologic Oncology  Department of Obstetrics and Gynecology  [facility]

## 2023-09-03 ENCOUNTER — Encounter: Payer: Self-pay | Admitting: Gynecologic Oncology

## 2023-09-03 ENCOUNTER — Inpatient Hospital Stay: Payer: Medicare Other | Attending: Gynecologic Oncology | Admitting: Gynecologic Oncology

## 2023-09-03 VITALS — BP 158/92 | HR 70 | Temp 98.7°F | Resp 18 | Wt 182.4 lb

## 2023-09-03 DIAGNOSIS — D6862 Lupus anticoagulant syndrome: Secondary | ICD-10-CM | POA: Insufficient documentation

## 2023-09-03 DIAGNOSIS — Z90722 Acquired absence of ovaries, bilateral: Secondary | ICD-10-CM | POA: Insufficient documentation

## 2023-09-03 DIAGNOSIS — Z86718 Personal history of other venous thrombosis and embolism: Secondary | ICD-10-CM | POA: Insufficient documentation

## 2023-09-03 DIAGNOSIS — Z9011 Acquired absence of right breast and nipple: Secondary | ICD-10-CM | POA: Insufficient documentation

## 2023-09-03 DIAGNOSIS — Z79890 Hormone replacement therapy (postmenopausal): Secondary | ICD-10-CM | POA: Insufficient documentation

## 2023-09-03 DIAGNOSIS — Z9221 Personal history of antineoplastic chemotherapy: Secondary | ICD-10-CM | POA: Insufficient documentation

## 2023-09-03 DIAGNOSIS — C541 Malignant neoplasm of endometrium: Secondary | ICD-10-CM | POA: Diagnosis present

## 2023-09-03 DIAGNOSIS — Z853 Personal history of malignant neoplasm of breast: Secondary | ICD-10-CM | POA: Insufficient documentation

## 2023-09-03 DIAGNOSIS — I10 Essential (primary) hypertension: Secondary | ICD-10-CM | POA: Diagnosis not present

## 2023-09-03 DIAGNOSIS — Z90710 Acquired absence of both cervix and uterus: Secondary | ICD-10-CM | POA: Insufficient documentation

## 2023-09-03 NOTE — Patient Instructions (Signed)
It was good to see you today.  I do not see or feel any evidence of cancer recurrence on your exam.  I will see you for follow-up in 3 months.  As always, if you develop any new and concerning symptoms before your next visit, please call to see me sooner.  

## 2023-10-04 ENCOUNTER — Telehealth: Payer: Self-pay | Admitting: Student

## 2023-10-04 NOTE — Telephone Encounter (Signed)
Return pt's call. C/o left leg pain, burning sensation, and numbness x 1 week. And stated "and it's getting worse". Stated her blood sugars are doing ok (in the 100's). No available appts this week at this time. She stated she will go to the ER if it gets worse. Appt has been schedule for 10/25 @ 0845AM.

## 2023-10-04 NOTE — Telephone Encounter (Signed)
Pt requesting a call back about her left leg numbness and burning x 1 week.

## 2023-10-05 NOTE — Telephone Encounter (Signed)
Available  appt tomorrow am @ 0845 AM - I called to see if she can come, stated she cannot.

## 2023-10-07 ENCOUNTER — Telehealth: Payer: Self-pay | Admitting: *Deleted

## 2023-10-07 NOTE — Telephone Encounter (Signed)
Per provider moved appt on 12/13 to earlier in the day

## 2023-10-15 ENCOUNTER — Encounter: Payer: Self-pay | Admitting: Internal Medicine

## 2023-10-15 ENCOUNTER — Other Ambulatory Visit: Payer: Self-pay

## 2023-10-15 ENCOUNTER — Other Ambulatory Visit: Payer: Self-pay | Admitting: *Deleted

## 2023-10-15 ENCOUNTER — Ambulatory Visit (INDEPENDENT_AMBULATORY_CARE_PROVIDER_SITE_OTHER): Payer: Medicare Other | Admitting: Internal Medicine

## 2023-10-15 VITALS — BP 121/79 | HR 81 | Temp 98.1°F | Ht 67.0 in | Wt 174.7 lb

## 2023-10-15 DIAGNOSIS — E876 Hypokalemia: Secondary | ICD-10-CM

## 2023-10-15 DIAGNOSIS — Z86718 Personal history of other venous thrombosis and embolism: Secondary | ICD-10-CM | POA: Diagnosis not present

## 2023-10-15 DIAGNOSIS — R809 Proteinuria, unspecified: Secondary | ICD-10-CM | POA: Diagnosis not present

## 2023-10-15 DIAGNOSIS — E1129 Type 2 diabetes mellitus with other diabetic kidney complication: Secondary | ICD-10-CM

## 2023-10-15 DIAGNOSIS — M79605 Pain in left leg: Secondary | ICD-10-CM

## 2023-10-15 LAB — BASIC METABOLIC PANEL
Anion gap: 12 (ref 5–15)
BUN: 13 mg/dL (ref 8–23)
CO2: 27 mmol/L (ref 22–32)
Calcium: 10 mg/dL (ref 8.9–10.3)
Chloride: 103 mmol/L (ref 98–111)
Creatinine, Ser: 0.81 mg/dL (ref 0.44–1.00)
GFR, Estimated: >60 mL/min (ref >60)
Glucose, Bld: 106 mg/dL — ABNORMAL HIGH (ref 70–99)
Potassium: 2.9 mmol/L — ABNORMAL LOW (ref 3.5–5.1)
Sodium: 142 mmol/L (ref 135–145)

## 2023-10-15 LAB — D-DIMER, QUANTITATIVE: D-Dimer, Quant: 0.5 ug{FEU}/mL (ref 0.00–0.50)

## 2023-10-15 MED ORDER — POTASSIUM CHLORIDE CRYS ER 20 MEQ PO TBCR
20.0000 meq | EXTENDED_RELEASE_TABLET | Freq: Once | ORAL | 0 refills | Status: DC
Start: 2023-10-15 — End: 2024-09-06

## 2023-10-15 NOTE — Assessment & Plan Note (Signed)
BMP showed K of 2.9. she denies vomiting or diarrhea. She has had problems with potassium in last 8 months and ws taken off hydrochlorothiazide several months ago.  P: Start K 20 meq daily Follow-up next week for lab only with BMP and Mg

## 2023-10-15 NOTE — Addendum Note (Signed)
Addended by: Maura Crandall on: 10/15/2023 11:49 AM   Modules accepted: Orders

## 2023-10-15 NOTE — Assessment & Plan Note (Signed)
>>  ASSESSMENT AND PLAN FOR HISTORY OF DVT (DEEP VEIN THROMBOSIS) WRITTEN ON 10/22/2023  8:39 AM BY MASTERS, KATIE, DO  History of + lupus anticoagulant. Cardiolipin and anti-beta2 glycoprotein were normal. She has had multiple unprovoked DVT, last in 2021. She is recommended to be on lifelong anticoagulation. This was held this past spring in setting of vaginal bleeding then hysterectomy that was completed due to endometrial cancer. She is having difficulty with accessing medication due to cost. Xarelto  was sent in in September but this was >$200. She has not used eliquis  starter pack co pay coupon, so could consider this. I messaged Camille to see about options for patient. Lavern has had multiple phone calls with her about assistance programs (see notes 06/24). I called and talked with patient and she did not recall talking on phone with camille or getting assistance paperwork in mail. She does not have a computer at home to look at online webside for Xarelto  withme. The process for accessing eliquis  long term sounds complicated in that she would need to apply for low income subsidy help with social security and then need to be denied prior to [place].  -patient plans to see if daughter can help with online resource when she is back in town next week. I will call her to help coordinate this. -referral for care coordination placed  Addendum 11/01: She is not interested in taking anticoagulant medications for history of unprovoked DVT several years ago.

## 2023-10-15 NOTE — Assessment & Plan Note (Signed)
>>  ASSESSMENT AND PLAN FOR LEFT LEG PAIN WRITTEN ON 10/15/2023  5:07 PM BY MASTERS, KATIE, DO  Patient presented with burning pain and numbness in anterior thigh. This has been ongoing since September. The pain is worst at night when she is watching TV. She has tried voltaren  gel without improvement. She also takes gabapentin  900 mg at bedtime. She does not wear belts and generally wears loose fitting elastic pants. When she sleeps, she is typically laying on her left side. A: Distribution of burning pain is within lateral femoral cutanous nerve distribution but she reports some pain that is more medial than I would expect. Differentials also include DVT with her history of lupus anticoagulant. She was not able to pick up xarelto  starter pack as it was >$200. Exam shows symmetric legs with no redness or tenderness along venous system. Her symptoms are not worsened with activity so less suspicion for arterial clot and legs are warm and well perfused with 2+ DP pulses bilaterally. P: I discussed behavioral recommendation with patient to avoid laying on left side. F/u in December with PCP. Continue gabapentin  900 mg qhs D-Dimer was right at positive cutoff. She needs to be on anti coagulation with her history of multiple unprovoked DVT. She had workup with hematology in 2021 that showed +lupus but negative for other antibodies for antiphospholipid syndrome. I [employer] and she has had conversation with patient several months ago about how to get assistance for medications. Please see history of DVT for more details

## 2023-10-15 NOTE — Assessment & Plan Note (Addendum)
History of + lupus anticoagulant. Cardiolipin and anti-beta2 glycoprotein were normal. She has had multiple unprovoked DVT, last in 2021. She is recommended to be on lifelong anticoagulation. This was held this past spring in setting of vaginal bleeding then hysterectomy that was completed due to endometrial cancer. She is having difficulty with accessing medication due to cost. Xarelto was sent in in September but this was >$200. She has not used eliquis starter pack co pay coupon, so could consider this. I messaged Camille to see about options for patient. Durward Mallard has had multiple phone calls with her about assistance programs (see notes 06/24). I called and talked with patient and she did not recall talking on phone with camille or getting assistance paperwork in mail. She does not have a computer at home to look at online webside for Xarelto withme. The process for accessing eliquis long term sounds complicated in that she would need to apply for low income subsidy help with social security and then need to be denied prior to [place].  -patient plans to see if daughter can help with online resource when she is back in town next week. I will call her to help coordinate this. -referral for care coordination placed  Addendum 11/01: She is not interested in taking anticoagulant medications for history of unprovoked DVT several years ago.

## 2023-10-15 NOTE — Patient Instructions (Signed)
Leg pain- with your history I am going to check some blood work to make sure no sign of blood clot. If this is positive then we will have to have you come in for an ultrasound. Diabetes- I am checking your urine for protein. Diabetes can impact your kidneys and cause protein leakage. There are several medications that will help prevent this. You are already on one of them called losartan. Blood clots- I think we need to get you on blood thinners- I am reaching out to a few people to see how we can help with making the medications more affordable.

## 2023-10-15 NOTE — Assessment & Plan Note (Addendum)
Patient presented with burning pain and numbness in anterior thigh. This has been ongoing since September. The pain is worst at night when she is watching TV. She has tried voltaren gel without improvement. She also takes gabapentin 900 mg at bedtime. She does not wear belts and generally wears loose fitting elastic pants. When she sleeps, she is typically laying on her left side. A: Distribution of burning pain is within lateral femoral cutanous nerve distribution but she reports some pain that is more medial than I would expect. Differentials also include DVT with her history of lupus anticoagulant. She was not able to pick up xarelto starter pack as it was >$200. Exam shows symmetric legs with no redness or tenderness along venous system. Her symptoms are not worsened with activity so less suspicion for arterial clot and legs are warm and well perfused with 2+ DP pulses bilaterally. P: I discussed behavioral recommendation with patient to avoid laying on left side. F/u in December with PCP. Continue gabapentin 900 mg qhs D-Dimer was right at positive cutoff. She needs to be on anti coagulation with her history of multiple unprovoked DVT. She had workup with hematology in 2021 that showed +lupus but negative for other antibodies for antiphospholipid syndrome. I [employer] and she has had conversation with patient several months ago about how to get assistance for medications. Please see history of DVT for more details

## 2023-10-15 NOTE — Progress Notes (Addendum)
Subjective:  CC: left leg pain  HPI:  Yvonne [name] is a 77 y.o. female with a past medical history stated below and presents today for left leg pain.   Please see problem based assessment and plan for additional details.  Past Medical History:  Diagnosis Date   Acute deep vein thrombosis (DVT) of popliteal vein of right lower extremity (HCC) 09/12/2020   AKI (acute kidney injury) (HCC) 02/09/2013   Arthritis    Breast cancer (HCC) 04/2012   right breast   Bruises easily    Cancer (HCC) 03/25/2012   right breast   Chronic back pain    arthritis   Diabetes mellitus without complication (HCC)    Elevated cholesterol    takes Crestor daily   Hemorrhoids    History of chemotherapy    x 1, pt unable to tolerate, on PO chemotherpay   Hypertension    takes Maxzide daily    Phlebitis 93yrs ago   hx of-   Seasonal allergies    takes Zyrtec daily and Nasal Spray prn    Current Outpatient Medications on File Prior to Visit  Medication Sig Dispense Refill   gabapentin (NEURONTIN) 300 MG capsule Take 900 mg by mouth at bedtime.     diclofenac Sodium (VOLTAREN) 1 % GEL Apply 2 g topically every 8 (eight) hours as needed (apply to knee). 100 g 1   glucose blood ([employer] GLUCOSE METER TEST STRIPS) test strip Use as instructed 100 each 12   Lancets (FREESTYLE) lancets 1 each by Other route daily. 100 each 2   losartan (COZAAR) 50 MG tablet Take 1 tablet (50 mg total) by mouth daily. 90 tablet 3   metFORMIN (GLUCOPHAGE) 500 MG tablet Take 0.5 tablets (250 mg total) by mouth 2 (two) times daily with a meal. 90 tablet 3   rosuvastatin (CRESTOR) 40 MG tablet Take 1 tablet (40 mg total) by mouth daily. 90 tablet 3   No current facility-administered medications on file prior to visit.    Family History  Problem Relation Age of Onset   Cancer Mother        ovarian cancer vs uterine   Leukemia Father    Cancer Father        leukemia   Cancer Sister 66       colon cancer    Colon cancer Sister    Cancer Brother 54       colon cancer   Colon cancer Brother    Cancer Brother        prostate cancer, deceased   Anesthesia problems Neg Hx    Hypotension Neg Hx    Malignant hyperthermia Neg Hx    Pseudochol deficiency Neg Hx    Breast cancer Neg Hx    Ovarian cancer Neg Hx    Endometrial cancer Neg Hx    Pancreatic cancer Neg Hx    Prostate cancer Neg Hx     Social History   Socioeconomic History   Marital status: Married    Spouse name: Not on file   Number of children: 5   Years of education: Not on file   Highest education level: Not on file  Occupational History   Not on file  Tobacco Use   Smoking status: Former    Current packs/day: 0.00    Types: Cigarettes    Quit date: 01/10/1984    Years since quitting: 39.8   Smokeless tobacco: Never   Tobacco comments:  quit 30+yrs ago  Vaping Use   Vaping status: Never Used  Substance and Sexual Activity   Alcohol use: No   Drug use: No   Sexual activity: Yes    Birth control/protection: Post-menopausal    Comment: menarche age 65, P5,, 1st pregancy 6, menop 74, no HRT  Other Topics Concern   Not on file  Social History Narrative   Not on file   Social Determinants of Health   Financial Resource Strain: Medium Risk (05/31/2023)   Overall Financial Resource Strain (CARDIA)    Difficulty of Paying Living Expenses: Somewhat hard  Food Insecurity: No Food Insecurity (05/31/2023)   Hunger Vital Sign    Worried About Running Out of Food in the Last Year: Never true    Ran Out of Food in the Last Year: Never true  Transportation Needs: No Transportation Needs (05/31/2023)   PRAPARE - Administrator, Civil Service (Medical): No    Lack of Transportation (Non-Medical): No  Physical Activity: Inactive (05/31/2023)   Exercise Vital Sign    Days of Exercise per Week: 0 days    Minutes of Exercise per Session: 0 min  Stress: No Stress Concern Present (05/31/2023)   [employer]  of Occupational Health - Occupational Stress Questionnaire    Feeling of Stress : Only a little  Social Connections: Socially Integrated (05/31/2023)   Social Connection and Isolation Panel [NHANES]    Frequency of Communication with Friends and Family: More than three times a week    Frequency of Social Gatherings with Friends and Family: More than three times a week    Attends Religious Services: More than 4 times per year    Active Member of [employer] or Organizations: Yes    Attends Banker Meetings: Never    Marital Status: Married  Catering manager Violence: Not At Risk (05/31/2023)   Humiliation, Afraid, Rape, and Kick questionnaire    Fear of Current or Ex-Partner: No    Emotionally Abused: No    Physically Abused: No    Sexually Abused: No    Review of Systems: ROS negative except for what is noted on the assessment and plan.  Objective:   Vitals:   10/15/23 0842 10/15/23 0850  BP: (!) 146/122 121/79  Pulse: 81   Temp: 98.1 F (36.7 C)   TempSrc: Oral   SpO2: 97%   Weight: 174 lb 11.2 oz (79.2 kg)   Height: 5\' 7"  (1.702 m)     Physical Exam: Constitutional: well-appearing Cardiovascular: regular rate and rhythm, no m/r/g Pulmonary/Chest: normal work of breathing on room air, lungs clear to auscultation bilaterally MSK: no tenderness to palpation of left thigh, no redness or swelling appreciated to either lower extremity, 5/5 strength in hip flexion bilaterally and 5/5 knee flexion and extension  Neurological: alert & oriented x 3, 2+ DP bilaterally   Assessment & Plan:  Left leg pain Patient presented with burning pain and numbness in anterior thigh. This has been ongoing since September. The pain is worst at night when she is watching TV. She has tried voltaren gel without improvement. She also takes gabapentin 900 mg at bedtime. She does not wear belts and generally wears loose fitting elastic pants. When she sleeps, she is typically laying on her left  side. A: Distribution of burning pain is within lateral femoral cutanous nerve distribution but she reports some pain that is more medial than I would expect. Differentials also include DVT with her history of lupus  anticoagulant. She was not able to pick up xarelto starter pack as it was >$200. Exam shows symmetric legs with no redness or tenderness along venous system. Her symptoms are not worsened with activity so less suspicion for arterial clot and legs are warm and well perfused with 2+ DP pulses bilaterally. P: I discussed behavioral recommendation with patient to avoid laying on left side. F/u in December with PCP. Continue gabapentin 900 mg qhs D-Dimer was right at positive cutoff. She needs to be on anti coagulation with her history of multiple unprovoked DVT. She had workup with hematology in 2021 that showed +lupus but negative for other antibodies for antiphospholipid syndrome. I [employer] and she has had conversation with patient several months ago about how to get assistance for medications. Please see history of DVT for more details  History of DVT (deep vein thrombosis) History of + lupus anticoagulant. Cardiolipin and anti-beta2 glycoprotein were normal. She has had multiple unprovoked DVT, last in 2021. She is recommended to be on lifelong anticoagulation. This was held this past spring in setting of vaginal bleeding then hysterectomy that was completed due to endometrial cancer. She is having difficulty with accessing medication due to cost. Xarelto was sent in in September but this was >$200. She has not used eliquis starter pack co pay coupon, so could consider this. I messaged Camille to see about options for patient. Durward Mallard has had multiple phone calls with her about assistance programs (see notes 06/24). I called and talked with patient and she did not recall talking on phone with camille or getting assistance paperwork in mail. She does not have a computer at home to look at  online webside for Xarelto withme. The process for accessing eliquis long term sounds complicated in that she would need to apply for low income subsidy help with social security and then need to be denied prior to [place].  -patient plans to see if daughter can help with online resource when she is back in town next week. I will call her to help coordinate this. -referral for care coordination placed  Addendum 11/01: She is not interested in taking anticoagulant medications for history of unprovoked DVT several years ago.  Hypokalemia BMP showed K of 2.9. she denies vomiting or diarrhea. She has had problems with potassium in last 8 months and ws taken off hydrochlorothiazide several months ago.  P: Start K 20 meq daily Follow-up next week for lab only with BMP and Mg     Patient discussed with Dr. Wille Celeste Gentle Hoge, D.O. [facility]  PGY-3 Pager: [phone]  Phone: [phone] Date 10/22/2023  Time 8:39 AM

## 2023-10-16 LAB — MICROALBUMIN / CREATININE URINE RATIO
Creatinine, Urine: 258 mg/dL
Microalb/Creat Ratio: 32 mg/g{creat} — ABNORMAL HIGH (ref 0–29)
Microalbumin, Urine: 82.3 ug/mL

## 2023-10-18 NOTE — Progress Notes (Signed)
Internal Medicine Clinic Attending  Case discussed with the resident at the time of the visit.  We reviewed the resident's history and exam and pertinent patient test results.  I agree with the assessment, diagnosis, and plan of care documented in the resident's note.  

## 2023-10-21 ENCOUNTER — Telehealth: Payer: Self-pay | Admitting: Student

## 2023-10-21 NOTE — Telephone Encounter (Signed)
Please refer to message below.  Attempted to contact patient to schedule a lab appt at the request of Dr. Sloan Leiter.  But no answer and voicemail is not set up.  Appt has been scheduled for next Thursday 10/28/23 at 9:30 am and mailed to patient.

## 2023-10-25 ENCOUNTER — Encounter: Payer: Self-pay | Admitting: *Deleted

## 2023-10-27 ENCOUNTER — Telehealth: Payer: Self-pay

## 2023-10-27 NOTE — Progress Notes (Signed)
   Care Guide Note  10/27/2023 Name: Yvonne [name] MRN: [id number] DOB: 04/23/1946  Referred by: Katheran James, DO Reason for referral : Care Coordination (Outreach to schedule with Pharm d )   Yvonne [name] is a 77 y.o. year old female who is a primary care patient of Katheran James, DO. Yvonne [name] was referred to the pharmacist for assistance related to  DVT  .    An unsuccessful telephone outreach was attempted today to contact the patient who was referred to the pharmacy team for assistance with medication assistance. Additional attempts will be made to contact the patient.   [name], RMA Care Guide [facility]  [place], [place] [address] Direct Dial: [phone] [email or web address]

## 2023-10-28 ENCOUNTER — Other Ambulatory Visit (INDEPENDENT_AMBULATORY_CARE_PROVIDER_SITE_OTHER): Payer: Medicare Other

## 2023-10-28 DIAGNOSIS — E876 Hypokalemia: Secondary | ICD-10-CM | POA: Diagnosis not present

## 2023-10-29 LAB — BMP8+ANION GAP
Anion Gap: 18 mmol/L (ref 10.0–18.0)
BUN/Creatinine Ratio: 22 (ref 12–28)
BUN: 20 mg/dL (ref 8–27)
CO2: 19 mmol/L — ABNORMAL LOW (ref 20–29)
Calcium: 10.1 mg/dL (ref 8.7–10.3)
Chloride: 102 mmol/L (ref 96–106)
Creatinine, Ser: 0.93 mg/dL (ref 0.57–1.00)
Glucose: 101 mg/dL — ABNORMAL HIGH (ref 70–99)
Potassium: 4.7 mmol/L (ref 3.5–5.2)
Sodium: 139 mmol/L (ref 134–144)
eGFR: 64 mL/min/{1.73_m2} (ref >59)

## 2023-10-29 LAB — MAGNESIUM: Magnesium: 2.1 mg/dL (ref 1.6–2.3)

## 2023-11-01 NOTE — Progress Notes (Signed)
   Care Guide Note  11/01/2023 Name: Yvonne [name] MRN: [id number] DOB: September 09, 1946  Referred by: Katheran James, DO Reason for referral : Care Coordination (Outreach to schedule with Pharm d )   Yvonne [name] is a 77 y.o. year old female who is a primary care patient of Katheran James, DO. Yvonne [name] was referred to the pharmacist for assistance related to  DVT .    Successful contact was made with the patient to discuss pharmacy services. Patient declines engagement at this time. Contact information was provided to the patient should they wish to reach out for assistance at a later time.  [name], RMA Care Guide [facility]  [place], [place] [address] Direct Dial: [phone] [email or web address]

## 2023-11-23 ENCOUNTER — Other Ambulatory Visit (HOSPITAL_COMMUNITY): Payer: Self-pay

## 2023-11-25 ENCOUNTER — Encounter: Payer: Self-pay | Admitting: Pharmacist

## 2023-11-25 NOTE — Progress Notes (Signed)
Pharmacy Quality Measure Review  This patient is appearing on a report for being at risk of failing the adherence measure for hypertension (ACEi/ARB) medications this calendar year.   Medication: losartan 50 mg  Last fill date: 12/4 for 90 day supply  Insurance report was not up to date. No action needed at this time.   Catie Eppie Gibson, PharmD, BCACP, CPP Clinical Pharmacist [facility] [phone]

## 2023-11-30 ENCOUNTER — Encounter: Payer: Medicare Other | Admitting: Student

## 2023-12-01 ENCOUNTER — Ambulatory Visit: Payer: No Typology Code available for payment source | Admitting: Student

## 2023-12-01 ENCOUNTER — Encounter: Payer: Self-pay | Admitting: Student

## 2023-12-01 VITALS — BP 138/59 | HR 69 | Temp 98.0°F | Ht 67.0 in | Wt 179.2 lb

## 2023-12-01 DIAGNOSIS — D6862 Lupus anticoagulant syndrome: Secondary | ICD-10-CM | POA: Diagnosis not present

## 2023-12-01 DIAGNOSIS — R760 Raised antibody titer: Secondary | ICD-10-CM

## 2023-12-01 DIAGNOSIS — M79605 Pain in left leg: Secondary | ICD-10-CM

## 2023-12-01 DIAGNOSIS — E1169 Type 2 diabetes mellitus with other specified complication: Secondary | ICD-10-CM

## 2023-12-01 DIAGNOSIS — I10 Essential (primary) hypertension: Secondary | ICD-10-CM | POA: Diagnosis not present

## 2023-12-01 DIAGNOSIS — R809 Proteinuria, unspecified: Secondary | ICD-10-CM

## 2023-12-01 DIAGNOSIS — H9190 Unspecified hearing loss, unspecified ear: Secondary | ICD-10-CM | POA: Insufficient documentation

## 2023-12-01 DIAGNOSIS — Z86718 Personal history of other venous thrombosis and embolism: Secondary | ICD-10-CM

## 2023-12-01 DIAGNOSIS — E785 Hyperlipidemia, unspecified: Secondary | ICD-10-CM

## 2023-12-01 LAB — POCT GLYCOSYLATED HEMOGLOBIN (HGB A1C): Hemoglobin A1C: 5.5 % (ref 4.0–5.6)

## 2023-12-01 LAB — GLUCOSE, CAPILLARY: Glucose-Capillary: 95 mg/dL (ref 70–99)

## 2023-12-01 MED ORDER — APIXABAN 2.5 MG PO TABS: 2.5000 mg | ORAL_TABLET | Freq: Two times a day (BID) | ORAL | 5 refills | Status: DC

## 2023-12-01 MED ORDER — ROSUVASTATIN CALCIUM 40 MG PO TABS
40.0000 mg | ORAL_TABLET | Freq: Every day | ORAL | 3 refills | Status: DC
Start: 2023-12-01 — End: 2024-02-29

## 2023-12-01 MED ORDER — GABAPENTIN 300 MG PO CAPS
900.0000 mg | ORAL_CAPSULE | Freq: Every day | ORAL | 6 refills | Status: DC
Start: 2023-12-01 — End: 2024-02-29

## 2023-12-01 MED ORDER — LOSARTAN POTASSIUM 50 MG PO TABS: 50.0000 mg | ORAL_TABLET | Freq: Every day | ORAL | 3 refills | Status: DC

## 2023-12-01 NOTE — Patient Instructions (Addendum)
Stop metformin and potassium  Start Eliquis 2.5mg  twice daily  Continue crestor and losartan

## 2023-12-03 ENCOUNTER — Inpatient Hospital Stay: Payer: No Typology Code available for payment source | Attending: Gynecologic Oncology | Admitting: Gynecologic Oncology

## 2023-12-03 ENCOUNTER — Encounter: Payer: Self-pay | Admitting: Gynecologic Oncology

## 2023-12-03 VITALS — BP 139/65 | HR 74 | Temp 99.0°F | Resp 20 | Wt 180.2 lb

## 2023-12-03 DIAGNOSIS — Z9079 Acquired absence of other genital organ(s): Secondary | ICD-10-CM | POA: Insufficient documentation

## 2023-12-03 DIAGNOSIS — Z853 Personal history of malignant neoplasm of breast: Secondary | ICD-10-CM | POA: Diagnosis not present

## 2023-12-03 DIAGNOSIS — Z9010 Acquired absence of unspecified breast and nipple: Secondary | ICD-10-CM | POA: Insufficient documentation

## 2023-12-03 DIAGNOSIS — Z923 Personal history of irradiation: Secondary | ICD-10-CM | POA: Insufficient documentation

## 2023-12-03 DIAGNOSIS — C541 Malignant neoplasm of endometrium: Secondary | ICD-10-CM

## 2023-12-03 DIAGNOSIS — Z9221 Personal history of antineoplastic chemotherapy: Secondary | ICD-10-CM | POA: Insufficient documentation

## 2023-12-03 DIAGNOSIS — Z90722 Acquired absence of ovaries, bilateral: Secondary | ICD-10-CM | POA: Insufficient documentation

## 2023-12-03 DIAGNOSIS — Z8542 Personal history of malignant neoplasm of other parts of uterus: Secondary | ICD-10-CM | POA: Diagnosis not present

## 2023-12-03 DIAGNOSIS — D6862 Lupus anticoagulant syndrome: Secondary | ICD-10-CM

## 2023-12-03 DIAGNOSIS — Z90710 Acquired absence of both cervix and uterus: Secondary | ICD-10-CM | POA: Diagnosis not present

## 2023-12-03 DIAGNOSIS — R760 Raised antibody titer: Secondary | ICD-10-CM | POA: Insufficient documentation

## 2023-12-03 HISTORY — DX: Lupus anticoagulant syndrome: D68.62

## 2023-12-03 MED ORDER — RIVAROXABAN 10 MG PO TABS
10.0000 mg | ORAL_TABLET | Freq: Every day | ORAL | 6 refills | Status: DC
Start: 2023-12-03 — End: 2023-12-07

## 2023-12-03 NOTE — Assessment & Plan Note (Addendum)
Here for follow-up of left leg pain, it appears roughly stable from previous visits.  Is on the left leg anterior thigh, a burning sensation that typically happens in the evening.  Consistent with meralgia paresthetica.  It is responding well to gabapentin.  Previous providers have spoken to her about weight, tight belts, clothing, etc., I did remind her of this.  Will continue gabapentin. - Gabapentin 900 nightly

## 2023-12-03 NOTE — Assessment & Plan Note (Signed)
>>  ASSESSMENT AND PLAN FOR LEFT LEG PAIN WRITTEN ON 12/03/2023  3:38 PM BY JUBERG, CHRISTOPHER, DO  Here for follow-up of left leg pain, it appears roughly stable from previous visits.  Is on the left leg anterior thigh, a burning sensation that typically happens in the evening.  Consistent with meralgia paresthetica.  It is responding well to gabapentin .  Previous providers have spoken to her about weight, tight belts, clothing, etc., I did remind her of this.  Will continue gabapentin . - Gabapentin  900 nightly

## 2023-12-03 NOTE — Assessment & Plan Note (Deleted)
History of positive lupus and lupus anticoagulant.  Has had DVTs in the past, most recent was 2021.  Due to endometrial bleeding in the setting of cancer this was stopped

## 2023-12-03 NOTE — Assessment & Plan Note (Addendum)
History of positive lupus anticoagulant.  Has had DVTs in the past, most recent was 2021.  Due to endometrial bleeding in the setting of cancer this was stopped 2 years ago but this has since been treated.  She lost insurance and was unable to continue with her anticoagulation but now she is able to resume it having regained insurance.  She is most comfortable with Eliquis 2.5 twice daily, reduced dose as she was worried her higher dose made her feel bad.  Per her original hematology notes she was originally planned to be on Xarelto 10.  She is agreeable to ultimately changing to Xarelto 10.  For now she has Eliquis 2.5 twice daily and will continue that until she runs out, at which when she refills it will be to Xarelto 10. - Continue Eliquis 2.5 twice daily for now, at next refill she will change to Xarelto 10 daily. ADD 12/17 - upon discussion with patient by phone, I will changer her therapy to Xarelto 2.5 daily. Ideally she would be on 10 but she does not want to do that having tried it before and reporting malaise with that dose. Upon starting Eliquis the other day, she noted breaking out with a mild rash that is now improving. In this light, I will send her Xarelto 2.5mg  every day, which she is agreeable to.

## 2023-12-03 NOTE — Assessment & Plan Note (Addendum)
A1c is 5.5.  Consistently aat goal.  I will stop metformin. - Stop metformin 500 daily

## 2023-12-03 NOTE — Patient Instructions (Signed)
It was good to see you today.  I do not see or feel any evidence of cancer recurrence on your exam.  I will see you for follow-up in 3 months.  As always, if you develop any new and concerning symptoms before your next visit, please call to see me sooner.  

## 2023-12-03 NOTE — Progress Notes (Signed)
CC: Follow up leg pain and Diabetes  HPI:  Yvonne [name] is a 77 y.o. female living with a history stated below and presents today for follow up. Please see problem based assessment and plan for additional details.  Past Medical History:  Diagnosis Date   Acute deep vein thrombosis (DVT) of popliteal vein of right lower extremity (HCC) 09/12/2020   AKI (acute kidney injury) (HCC) 02/09/2013   Arthritis    Breast cancer (HCC) 04/2012   right breast   Bruises easily    Cancer (HCC) 03/25/2012   right breast   Chronic back pain    arthritis   Diabetes mellitus without complication (HCC)    Elevated cholesterol    takes Crestor daily   Hemorrhoids    History of chemotherapy    x 1, pt unable to tolerate, on PO chemotherpay   Hypertension    takes Maxzide daily    Phlebitis 56yrs ago   hx of-   Seasonal allergies    takes Zyrtec daily and Nasal Spray prn    Review of Systems: ROS negative except for what is noted on the assessment and plan.  Vitals:   12/01/23 0924  BP: (!) 138/59  Pulse: 69  Temp: 98 F (36.7 C)  TempSrc: Oral  SpO2: 99%  Weight: 179 lb 3.2 oz (81.3 kg)  Height: 5\' 7"  (1.702 m)    Physical Exam: Constitutional: well-appearing woman, in no acute distress HENT: normocephalic atraumatic, mucous membranes moist Eyes: conjunctiva non-erythematous Cardiovascular: regular rate and rhythm, no m/r/g Pulmonary/Chest: normal work of breathing on room air, lungs clear to auscultation bilaterally Abdominal: soft, non-tender, non-distended MSK: normal bulk and tone. Full strength and ROM in bilateral Les. No tenderness to palpation of leg, including L thigh. Neurological: alert & oriented x 3, no focal deficit.  Skin: warm and dry Psych: normal mood and behavior   Assessment & Plan:   Patient seen with Dr. Mayford Knife  Left leg pain Here for follow-up of left leg pain, it appears roughly stable from previous visits.  Is on the left leg anterior  thigh, a burning sensation that typically happens in the evening.  Consistent with meralgia paresthetica.  It is responding well to gabapentin.  Previous providers have spoken to her about weight, tight belts, clothing, etc., I did remind her of this.  Will continue gabapentin. - Gabapentin 900 nightly  Lupus anticoagulant positive History of positive lupus anticoagulant.  Has had DVTs in the past, most recent was 2021.  Due to endometrial bleeding in the setting of cancer this was stopped 2 years ago but this has since been treated.  She lost insurance and was unable to continue with her anticoagulation but now she is able to resume it having regained insurance.  She is most comfortable with Eliquis 2.5 twice daily, reduced dose as she was worried her higher dose made her feel bad.  Per her original hematology notes she was originally planned to be on Xarelto 10.  She is agreeable to ultimately changing to Xarelto 10.  For now she has Eliquis 2.5 twice daily and will continue that until she runs out, at which when she refills it will be to Xarelto 10. - Continue Eliquis 2.5 twice daily for now, at next refill she will change to Xarelto 10 daily. ADD 12/17 - upon discussion with patient by phone, I will changer her therapy to Xarelto 2.5 daily. Ideally she would be on 10 but she does not want to do  that having tried it before and reporting malaise with that dose. Upon starting Eliquis the other day, she noted breaking out with a mild rash that is now improving. In this light, I will send her Xarelto 2.5mg  every day, which she is agreeable to.  Type 2 diabetes mellitus with proteinuria (HCC) A1c is 5.5.  Consistently aat goal.  I will stop metformin. - Stop metformin 500 daily  Katheran James, D.O. [facility] Internal Medicine, PGY-1 Phone: [phone] Date 12/07/2023 Time 12:14 PM

## 2023-12-03 NOTE — Progress Notes (Signed)
Gynecologic Oncology Return Clinic Visit  12/03/23  Reason for Visit: surveillance   Treatment History: She presented to the ED with vaginal bleeding and hematochezia after recently starting Xarelto. She was seen for outpatient follow-up by OBGYN after pelvic ultrasound during her short hospitalization showed a uterus measuring 9 x 5 x 6 cm with a large central 6.2 cm endometrial vs myometrial mass. 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 The patient was started on 10 mg Provera at her follow-up visit on 5/6.    She has a history of breast cancer, provoked DVT (and possible a PE at the time of a pregnancy) and an unprovoked DVT in 2021. Work up revealed positive lupus anticoagulant.  She had initially been on Eliquis, was transition to Xarelto.  She was on prophylactic dosing until recently around the time of her vaginal bleeding.  Lower extremity Doppler in late April was negative for DVT on the right.  In terms of her breast cancer, this was stage IIb invasive ductal carcinoma, grade 2, ER/PR positive, HER2 negative.  She underwent modified radical mastectomy in 2013 followed by 1 cycle of adjuvant chemotherapy.  She declined further chemotherapy as well as radiation.  She was treated with anastrozole for 5 years, completed in late 2018.  In terms of her type 2 diabetes, the patient takes metformin twice daily.  Blood glucose at home is typically between 100-110.  Her last hemoglobin A1c in early April was 5.9%.   05/05/23: Robotic-assisted laparoscopic total hysterectomy with bilateral salpingoophorectomy, SLN biopsy, umbilical hernia repair  Path: grade 2, endometrioid, 95% MI, +LVI (>5 foci), negative SLNs MMRp, p53 WT  Interval History: Doing well.  Denies any pelvic pain.  Reports occasional pain around the umbilicus, unchanged in frequency.  Reports baseline bowel bladder function.  Denies any vaginal bleeding or discharge.  Past Medical/Surgical History: Past Medical  History:  Diagnosis Date   Acute deep vein thrombosis (DVT) of popliteal vein of right lower extremity (HCC) 09/12/2020   AKI (acute kidney injury) (HCC) 02/09/2013   Arthritis    Breast cancer (HCC) 04/2012   right breast   Bruises easily    Cancer (HCC) 03/25/2012   right breast   Chronic back pain    arthritis   Diabetes mellitus without complication (HCC)    Elevated cholesterol    takes Crestor daily   Hemorrhoids    History of chemotherapy    x 1, pt unable to tolerate, on PO chemotherpay   Hypertension    takes Maxzide daily    Phlebitis 17yrs ago   hx of-   Seasonal allergies    takes Zyrtec daily and Nasal Spray prn    Past Surgical History:  Procedure Laterality Date   APPENDECTOMY  28yrs ago   BREAST EXCISIONAL BIOPSY Left    BREAST SURGERY  04/27/12   Right mod mastectomy, ER/PR +, HER2 -   BREAST SURGERY  04/27/12   Left Breast NL lumpectomy-no malignancy   COLONOSCOPY     MASTECTOMY Right 2013   PORT-A-CATH REMOVAL  11/24/2012   Procedure: REMOVAL PORT-A-CATH;  Surgeon: [name], DO;  Location: [facility];  Service: General;  Laterality: Left;   PORTACATH PLACEMENT  04/26/2012   Procedure: INSERTION PORT-A-CATH;  Surgeon: [name], DO;  Location: [facility];  Service: General;  Laterality: Left;  Started at 1746.   ROBOTIC ASSISTED TOTAL HYSTERECTOMY WITH BILATERAL SALPINGO OOPHERECTOMY N/A 05/05/2023   Procedure: XI ROBOTIC ASSISTED TOTAL HYSTERECTOMY WITH BILATERAL  SALPINGO OOPHORECTOMY;  Surgeon: Carver Fila, MD;  Location: [facility];  Service: Gynecology;  Laterality: N/A;   SENTINEL NODE BIOPSY N/A 05/05/2023   Procedure: SENTINEL NODE BIOPSY;  Surgeon: Carver Fila, MD;  Location: [facility];  Service: Gynecology;  Laterality: N/A;   TUBAL LIGATION  72yrs ago   UMBILICAL HERNIA REPAIR N/A 05/05/2023   Procedure: HERNIA REPAIR UMBILICAL ADULT;  Surgeon: Carver Fila, MD;  Location: [facility];  Service: Gynecology;  Laterality: N/A;     Family History  Problem Relation Age of Onset   Cancer Mother        ovarian cancer vs uterine   Leukemia Father    Cancer Father        leukemia   Cancer Sister 93       colon cancer   Colon cancer Sister    Cancer Brother 37       colon cancer   Colon cancer Brother    Cancer Brother        prostate cancer, deceased   Anesthesia problems Neg Hx    Hypotension Neg Hx    Malignant hyperthermia Neg Hx    Pseudochol deficiency Neg Hx    Breast cancer Neg Hx    Ovarian cancer Neg Hx    Endometrial cancer Neg Hx    Pancreatic cancer Neg Hx    Prostate cancer Neg Hx     Social History   Socioeconomic History   Marital status: Married    Spouse name: Not on file   Number of children: 5   Years of education: Not on file   Highest education level: Not on file  Occupational History   Not on file  Tobacco Use   Smoking status: Former    Current packs/day: 0.00    Types: Cigarettes    Quit date: 01/10/1984    Years since quitting: 39.9   Smokeless tobacco: Never   Tobacco comments:    quit 30+yrs ago  Vaping Use   Vaping status: Never Used  Substance and Sexual Activity   Alcohol use: No   Drug use: No   Sexual activity: Yes    Birth control/protection: Post-menopausal    Comment: menarche age 24, P5,, 1st pregancy 59, menop 35, no HRT  Other Topics Concern   Not on file  Social History Narrative   Not on file   Social Drivers of Health   Financial Resource Strain: Medium Risk (05/31/2023)   Overall Financial Resource Strain (CARDIA)    Difficulty of Paying Living Expenses: Somewhat hard  Food Insecurity: No Food Insecurity (05/31/2023)   Hunger Vital Sign    Worried About Running Out of Food in the Last Year: Never true    Ran Out of Food in the Last Year: Never true  Transportation Needs: No Transportation Needs (05/31/2023)   PRAPARE - Administrator, Civil Service (Medical): No    Lack of Transportation (Non-Medical): No  Physical Activity:  Inactive (05/31/2023)   Exercise Vital Sign    Days of Exercise per Week: 0 days    Minutes of Exercise per Session: 0 min  Stress: No Stress Concern Present (05/31/2023)   [employer] of Occupational Health - Occupational Stress Questionnaire    Feeling of Stress : Only a little  Social Connections: Socially Integrated (05/31/2023)   Social Connection and Isolation Panel [NHANES]    Frequency of Communication with Friends and Family: More than three times a week  Frequency of Social Gatherings with Friends and Family: More than three times a week    Attends Religious Services: More than 4 times per year    Active Member of Clubs or Organizations: Yes    Attends Banker Meetings: Never    Marital Status: Married    Current Medications:  Current Outpatient Medications:    apixaban (ELIQUIS) 2.5 MG TABS tablet, Take 1 tablet (2.5 mg total) by mouth 2 (two) times daily., Disp: 60 tablet, Rfl: 5   diclofenac Sodium (VOLTAREN) 1 % GEL, Apply 2 g topically every 8 (eight) hours as needed (apply to knee)., Disp: 100 g, Rfl: 1   gabapentin (NEURONTIN) 300 MG capsule, Take 3 capsules (900 mg total) by mouth at bedtime., Disp: 90 capsule, Rfl: 6   glucose blood ([employer] GLUCOSE METER TEST STRIPS) test strip, Use as instructed, Disp: 100 each, Rfl: 12   Lancets (FREESTYLE) lancets, 1 each by Other route daily., Disp: 100 each, Rfl: 2   losartan (COZAAR) 50 MG tablet, Take 1 tablet (50 mg total) by mouth daily., Disp: 90 tablet, Rfl: 3   metFORMIN (GLUCOPHAGE) 500 MG tablet, Take 0.5 tablets (250 mg total) by mouth 2 (two) times daily with a meal., Disp: 90 tablet, Rfl: 3   potassium chloride SA (KLOR-CON M) 20 MEQ tablet, Take 1 tablet (20 mEq total) by mouth once for 1 dose., Disp: 14 tablet, Rfl: 0   rosuvastatin (CRESTOR) 40 MG tablet, Take 1 tablet (40 mg total) by mouth daily., Disp: 90 tablet, Rfl: 3  Review of Systems: + joint pain Denies appetite changes, fevers, chills,  fatigue, unexplained weight changes. Denies hearing loss, neck lumps or masses, mouth sores, ringing in ears or voice changes. Denies cough or wheezing.  Denies shortness of breath. Denies chest pain or palpitations. Denies leg swelling. Denies abdominal distention, pain, blood in stools, constipation, diarrhea, nausea, vomiting, or early satiety. Denies pain with intercourse, dysuria, frequency, hematuria or incontinence. Denies hot flashes, pelvic pain, vaginal bleeding or vaginal discharge.   Denies back pain or muscle pain/cramps. Denies itching, rash, or wounds. Denies dizziness, headaches, numbness or seizures. Denies swollen lymph nodes or glands, denies easy bruising or bleeding. Denies anxiety, depression, confusion, or decreased concentration.  Physical Exam: BP 139/65 (BP Location: Left Arm, Patient Position: Sitting)   Pulse 74   Temp 99 F (37.2 C) (Oral)   Resp 20   Wt 180 lb 3.2 oz (81.7 kg)   SpO2 98%   BMI 28.22 kg/m  General: Alert, oriented, no acute distress. HEENT: Normocephalic, atraumatic, sclera anicteric. Chest: Clear to auscultation bilaterally.  No wheezes or rhonchi. Cardiovascular: Regular rate and rhythm, no murmurs. Abdomen: soft, nontender.  Normoactive bowel sounds.  No masses or hepatosplenomegaly appreciated.  Well-healed incisions. Extremities: Grossly normal range of motion.  Warm, well perfused.  No edema bilaterally. Skin: No rashes or lesions noted. Lymphatics: No cervical, supraclavicular, or inguinal adenopathy. GU: Normal appearing external genitalia without erythema, excoriation, or lesions.  Speculum exam reveals moderately atrophic vaginal mucosa, cuff intact, no visible lesions, no bladder discharge.  Bimanual exam reveals cuff intact, no nodularity or masses.  Rectovaginal exam confirms these findings.  Laboratory & Radiologic Studies: None new  Assessment & Plan: Yvonne [name] is a 77 y.o. 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 Surgery 04/2023. 95% MI, LVI+. MMR intact. P53 WT. ER+. Adjuvant radiation was recommended given high risk features, patient declined.  Patient is overall doing well and is NED on exam today.   Given high risk features, we will continue with surveillance visits every 3 months.  Patient amenable with getting a CT scan given high risk disease and decision to defer adjuvant treatment.  This was scheduled for January.  We reviewed signs and symptoms that would be concerning for cancer recurrence, and I stressed the importance of calling if she develops any of these.  20 minutes of total time was spent for this patient encounter, including preparation, face-to-face counseling with the patient and coordination of care, and documentation of the encounter.  Eugene Garnet, MD  Division of Gynecologic Oncology  Department of Obstetrics and Gynecology  [facility]

## 2023-12-06 ENCOUNTER — Telehealth: Payer: Self-pay | Admitting: *Deleted

## 2023-12-06 NOTE — Telephone Encounter (Signed)
Call from pt stating the doctor put her on Xarelto 10 mg; states she cannot take the 10 mg b/c "it makes her as sick as a dog". States she can take the 2.5 mg but the 20 mg is too strong. States the doctor can call her. Thanks

## 2023-12-07 MED ORDER — RIVAROXABAN 2.5 MG PO TABS
2.5000 mg | ORAL_TABLET | Freq: Every day | ORAL | 5 refills | Status: DC
Start: 2023-12-07 — End: 2024-07-31

## 2023-12-07 NOTE — Telephone Encounter (Signed)
Patient is calling back about the following medication causing her to have a "Red Rash between her legs".  Pt is asking for someone to call her back.   Xarelto 10 mg; states she cannot take the 10 mg b/c it's too strong.

## 2023-12-07 NOTE — Addendum Note (Signed)
Addended by: Katheran James on: 12/07/2023 12:14 PM   Modules accepted: Orders

## 2023-12-17 ENCOUNTER — Ambulatory Visit
Admission: RE | Admit: 2023-12-17 | Discharge: 2023-12-17 | Disposition: A | Discharge status: Home / Self Care | Payer: No Typology Code available for payment source | Source: Ambulatory Visit | Attending: Student in an Organized Health Care Education/Training Program | Admitting: Student in an Organized Health Care Education/Training Program

## 2023-12-17 DIAGNOSIS — Z1231 Encounter for screening mammogram for malignant neoplasm of breast: Secondary | ICD-10-CM | POA: Diagnosis not present

## 2023-12-17 DIAGNOSIS — C50411 Malignant neoplasm of upper-outer quadrant of right female breast: Secondary | ICD-10-CM

## 2023-12-17 NOTE — Progress Notes (Signed)
Internal Medicine Clinic Attending  I was physically present during the key portions of the resident provided service and participated in the medical decision making of patient's management care. I reviewed pertinent patient test results.  The assessment, diagnosis, and plan were formulated together and I agree with the documentation in the resident's note.  Leg pain is consistent with meralgia paresthetica.   Miguel Aschoff, MD

## 2023-12-27 ENCOUNTER — Ambulatory Visit (HOSPITAL_COMMUNITY): Payer: No Typology Code available for payment source

## 2024-01-07 ENCOUNTER — Telehealth: Payer: Self-pay | Admitting: *Deleted

## 2024-01-07 NOTE — Telephone Encounter (Signed)
Dr Pricilla Holm aware that patient canceled CT on 1/6

## 2024-01-17 ENCOUNTER — Telehealth: Payer: Self-pay | Admitting: *Deleted

## 2024-01-17 NOTE — Telephone Encounter (Signed)
Spoke with Yvonne [name] in regards to CT scan ordered by [name]. Pt states that she will be charged a $200 co-pay to have the CT scan done and she states, "I can't afford the $200" Pt was advised that she could make payments on that co-pay and she still states she would be unable to make payments at this time. Pt had no further concerns or questions at this time.

## 2024-02-29 ENCOUNTER — Ambulatory Visit: Payer: No Typology Code available for payment source | Admitting: Student

## 2024-02-29 ENCOUNTER — Encounter: Payer: Self-pay | Admitting: Student

## 2024-02-29 VITALS — BP 128/72 | HR 67 | Temp 98.1°F | Ht 67.0 in | Wt 197.0 lb

## 2024-02-29 DIAGNOSIS — I10 Essential (primary) hypertension: Secondary | ICD-10-CM | POA: Diagnosis not present

## 2024-02-29 DIAGNOSIS — M8580 Other specified disorders of bone density and structure, unspecified site: Secondary | ICD-10-CM | POA: Diagnosis not present

## 2024-02-29 DIAGNOSIS — M79605 Pain in left leg: Secondary | ICD-10-CM

## 2024-02-29 DIAGNOSIS — E1169 Type 2 diabetes mellitus with other specified complication: Secondary | ICD-10-CM

## 2024-02-29 DIAGNOSIS — E1129 Type 2 diabetes mellitus with other diabetic kidney complication: Secondary | ICD-10-CM

## 2024-02-29 DIAGNOSIS — R809 Proteinuria, unspecified: Secondary | ICD-10-CM | POA: Diagnosis not present

## 2024-02-29 DIAGNOSIS — E785 Hyperlipidemia, unspecified: Secondary | ICD-10-CM

## 2024-02-29 DIAGNOSIS — G5710 Meralgia paresthetica, unspecified lower limb: Secondary | ICD-10-CM | POA: Insufficient documentation

## 2024-02-29 DIAGNOSIS — G5712 Meralgia paresthetica, left lower limb: Secondary | ICD-10-CM | POA: Diagnosis not present

## 2024-02-29 MED ORDER — ROSUVASTATIN CALCIUM 40 MG PO TABS
40.0000 mg | ORAL_TABLET | Freq: Every day | ORAL | 3 refills | Status: AC
  Filled 2024-11-14: qty 90, 90d supply, fill #0

## 2024-02-29 MED ORDER — ZIKS ARTHRITIS PAIN RELIEF 0.025-1-12 % EX CREA: TOPICAL_CREAM | CUTANEOUS | 3 refills | Status: DC

## 2024-02-29 MED ORDER — GABAPENTIN 300 MG PO CAPS
300.0000 mg | ORAL_CAPSULE | Freq: Three times a day (TID) | ORAL | 6 refills | Status: DC
Start: 2024-02-29 — End: 2024-07-12

## 2024-02-29 MED ORDER — LOSARTAN POTASSIUM 50 MG PO TABS
50.0000 mg | ORAL_TABLET | Freq: Every day | ORAL | 3 refills | Status: AC
  Filled 2024-11-14: qty 90, 90d supply, fill #0

## 2024-02-29 NOTE — Assessment & Plan Note (Signed)
 Osteopenic per DEXA in 2017. Believe a follow-up would be warranted at this point. Will order DEXA.

## 2024-02-29 NOTE — Patient Instructions (Signed)
 Ms Kamiya,  Thank you so much for coming in.  For your pain:        - Dr. Ninfa Meeker

## 2024-02-29 NOTE — Assessment & Plan Note (Addendum)
 This continues to distress her. Pain is mainly L side. Have discussed this before in office with recs for loose waistbands, belts, weight loss etc. Chronic pain including osteoarthritis make that a challenge. Gabapentin helps her.  - Will adjust gabapentin dosing from 900 at night to 300 TID - Trial capsaicin cream

## 2024-02-29 NOTE — Assessment & Plan Note (Signed)
 At goal, 128/72. Refills provided. - Continue losartan 50 every day

## 2024-02-29 NOTE — Progress Notes (Signed)
   CC: 3 mo FU chronic issues  HPI:  Yvonne [name] is a 78 y.o. female living with a history stated below and presents today for checkup. Please see problem based assessment and plan for additional details.  Past Medical History:  Diagnosis Date   Acute deep vein thrombosis (DVT) of popliteal vein of right lower extremity (HCC) 09/12/2020   AKI (acute kidney injury) (HCC) 02/09/2013   Arthritis    Breast cancer (HCC) 04/2012   right breast   Bruises easily    Cancer (HCC) 03/25/2012   right breast   Chronic back pain    arthritis   Diabetes mellitus without complication (HCC)    Elevated cholesterol    takes Crestor daily   Hemorrhoids    History of chemotherapy    x 1, pt unable to tolerate, on PO chemotherpay   Hypertension    takes Maxzide daily    Phlebitis 57yrs ago   hx of-   Seasonal allergies    takes Zyrtec daily and Nasal Spray prn    Review of Systems: ROS negative except for what is noted on the assessment and plan.  Vitals:   02/29/24 0942 02/29/24 0953  BP: (!) 157/83 128/72  Pulse: 72 67  Temp: 98.1 F (36.7 C)   TempSrc: Oral   SpO2: 96%   Weight: 197 lb (89.4 kg)   Height: 5\' 7"  (1.702 m)     Physical Exam: Constitutional: well-appearing woman sitting in chair, in no acute distress. She uses a cane to walk. Cardiovascular: regular rate and rhythm, no m/r/g Pulmonary/Chest: normal work of breathing on room air, lungs clear to auscultation bilaterally Neurological: alert & oriented x 3 Skin: warm and dry Psych: normal mood and behavior  Assessment & Plan:   Patient discussed with Dr.  Deirdre Priest  Today her primary concerns are pain of the L thigh due to meralgia paresthetica. She has no other acute complaints. No medication side effects. She is excited about an upcoming trip to [facility].  Meralgia paresthetica This continues to distress her. Pain is mainly L side. Have discussed this before in office with recs for loose waistbands,  belts, weight loss etc. Chronic pain including osteoarthritis make that a challenge. Gabapentin helps her.  - Will adjust gabapentin dosing from 900 at night to 300 TID - Trial capsaicin cream  Osteopenia Osteopenic per DEXA in 2017. Believe a follow-up would be warranted at this point. Will order DEXA.  Hypertension At goal, 128/72. Refills provided. - Continue losartan 50 every day  Hyperlipidemia Refilled Atorvastatin 40 - Lipids at next visit  Type 2 diabetes mellitus with proteinuria (HCC) Metformin stopped four months ago due to consistent great glycemic control. At this point she is diet controlled. Most recent A1c 5.5. - A1c next visit  RTC in 6 months for chronic disease checkup. At that time, would consider BMP, Lipid panel, A1c.   Katheran James, D.O. [facility] Internal Medicine, PGY-1 Phone: [phone] Date 02/29/2024 Time 2:34 PM

## 2024-02-29 NOTE — Assessment & Plan Note (Signed)
 Refilled Atorvastatin 40 - Lipids at next visit

## 2024-02-29 NOTE — Assessment & Plan Note (Addendum)
 Metformin stopped four months ago due to consistent great glycemic control. At this point she is diet controlled. Most recent A1c 5.5. - A1c next visit

## 2024-03-01 NOTE — Progress Notes (Signed)
 Internal Medicine Clinic Attending  Case discussed with the resident at the time of the visit.  We reviewed the resident's history and exam and pertinent patient test results.  I agree with the assessment, diagnosis, and plan of care documented in the resident's note.

## 2024-03-17 ENCOUNTER — Inpatient Hospital Stay: Payer: No Typology Code available for payment source | Attending: Gynecologic Oncology | Admitting: Gynecologic Oncology

## 2024-03-17 ENCOUNTER — Encounter: Payer: Self-pay | Admitting: Gynecologic Oncology

## 2024-03-17 VITALS — BP 130/66 | HR 69 | Temp 98.9°F | Resp 20 | Wt 194.2 lb

## 2024-03-17 DIAGNOSIS — Z86718 Personal history of other venous thrombosis and embolism: Secondary | ICD-10-CM | POA: Insufficient documentation

## 2024-03-17 DIAGNOSIS — Z8542 Personal history of malignant neoplasm of other parts of uterus: Secondary | ICD-10-CM | POA: Insufficient documentation

## 2024-03-17 DIAGNOSIS — D68312 Antiphospholipid antibody with hemorrhagic disorder: Secondary | ICD-10-CM | POA: Insufficient documentation

## 2024-03-17 DIAGNOSIS — Z90710 Acquired absence of both cervix and uterus: Secondary | ICD-10-CM | POA: Diagnosis not present

## 2024-03-17 DIAGNOSIS — Z853 Personal history of malignant neoplasm of breast: Secondary | ICD-10-CM | POA: Insufficient documentation

## 2024-03-17 DIAGNOSIS — Z90722 Acquired absence of ovaries, bilateral: Secondary | ICD-10-CM | POA: Diagnosis not present

## 2024-03-17 DIAGNOSIS — Z9079 Acquired absence of other genital organ(s): Secondary | ICD-10-CM | POA: Diagnosis not present

## 2024-03-17 DIAGNOSIS — Z9011 Acquired absence of right breast and nipple: Secondary | ICD-10-CM | POA: Diagnosis not present

## 2024-03-17 DIAGNOSIS — C541 Malignant neoplasm of endometrium: Secondary | ICD-10-CM

## 2024-03-17 DIAGNOSIS — Z9221 Personal history of antineoplastic chemotherapy: Secondary | ICD-10-CM | POA: Diagnosis not present

## 2024-03-17 NOTE — Patient Instructions (Signed)
 It was good to see you today.  I do not see or feel any evidence of cancer recurrence on your exam.  I will see you for follow-up in 3 months.  As always, if you develop any new and concerning symptoms before your next visit, please call to see me sooner.

## 2024-03-17 NOTE — Progress Notes (Signed)
 Gynecologic Oncology Return Clinic Visit  03/17/24  Reason for Visit: surveillance   Treatment History: She presented to the ED with vaginal bleeding and hematochezia after recently starting Xarelto. She was seen for outpatient follow-up by OBGYN after pelvic ultrasound during her short hospitalization showed a uterus measuring 9 x 5 x 6 cm with a large central 6.2 cm endometrial vs myometrial mass. 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 The patient was started on 10 mg Provera at her follow-up visit on 5/6.    She has a history of breast cancer, provoked DVT (and possible a PE at the time of a pregnancy) and an unprovoked DVT in 2021. Work up revealed positive lupus anticoagulant.  She had initially been on Eliquis, was transition to Xarelto.  She was on prophylactic dosing until recently around the time of her vaginal bleeding.  Lower extremity Doppler in late April was negative for DVT on the right.  In terms of her breast cancer, this was stage IIb invasive ductal carcinoma, grade 2, ER/PR positive, HER2 negative.  She underwent modified radical mastectomy in 2013 followed by 1 cycle of adjuvant chemotherapy.  She declined further chemotherapy as well as radiation.  She was treated with anastrozole for 5 years, completed in late 2018.  In terms of her type 2 diabetes, the patient takes metformin twice daily.  Blood glucose at home is typically between 100-110.  Her last hemoglobin A1c in early April was 5.9%.   05/05/23: Robotic-assisted laparoscopic total hysterectomy with bilateral salpingoophorectomy, SLN biopsy, umbilical hernia repair  Path: grade 2, endometrioid, 95% MI, +LVI (>5 foci), negative SLNs MMRp, p53 WT  Interval History: Doing well.  Denies any vaginal bleeding.  Reports normal bowel bladder function.  Denies any abdominal or pelvic pain.  Past Medical/Surgical History: Past Medical History:  Diagnosis Date   Acute deep vein thrombosis (DVT) of  popliteal vein of right lower extremity (HCC) 09/12/2020   AKI (acute kidney injury) (HCC) 02/09/2013   Arthritis    Breast cancer (HCC) 04/2012   right breast   Bruises easily    Cancer (HCC) 03/25/2012   right breast   Chronic back pain    arthritis   Diabetes mellitus without complication (HCC)    Elevated cholesterol    takes Crestor daily   Hemorrhoids    History of chemotherapy    x 1, pt unable to tolerate, on PO chemotherpay   Hypertension    takes Maxzide daily    Phlebitis 13yrs ago   hx of-   Seasonal allergies    takes Zyrtec daily and Nasal Spray prn    Past Surgical History:  Procedure Laterality Date   APPENDECTOMY  18yrs ago   BREAST EXCISIONAL BIOPSY Left    BREAST SURGERY  04/27/12   Right mod mastectomy, ER/PR +, HER2 -   BREAST SURGERY  04/27/12   Left Breast NL lumpectomy-no malignancy   COLONOSCOPY     MASTECTOMY Right 2013   PORT-A-CATH REMOVAL  11/24/2012   Procedure: REMOVAL PORT-A-CATH;  Surgeon: [name], DO;  Location: [facility];  Service: General;  Laterality: Left;   PORTACATH PLACEMENT  04/26/2012   Procedure: INSERTION PORT-A-CATH;  Surgeon: [name], DO;  Location: [facility];  Service: General;  Laterality: Left;  Started at 1746.   ROBOTIC ASSISTED TOTAL HYSTERECTOMY WITH BILATERAL SALPINGO OOPHERECTOMY N/A 05/05/2023   Procedure: XI ROBOTIC ASSISTED TOTAL HYSTERECTOMY WITH BILATERAL SALPINGO OOPHORECTOMY;  Surgeon: Carver Fila, MD;  Location:  [facility];  Service: Gynecology;  Laterality: N/A;   SENTINEL NODE BIOPSY N/A 05/05/2023   Procedure: SENTINEL NODE BIOPSY;  Surgeon: Carver Fila, MD;  Location: [facility];  Service: Gynecology;  Laterality: N/A;   TUBAL LIGATION  1yrs ago   UMBILICAL HERNIA REPAIR N/A 05/05/2023   Procedure: HERNIA REPAIR UMBILICAL ADULT;  Surgeon: Carver Fila, MD;  Location: [facility];  Service: Gynecology;  Laterality: N/A;    Family History  Problem Relation Age of Onset   Cancer  Mother        ovarian cancer vs uterine   Leukemia Father    Cancer Father        leukemia   Cancer Sister 58       colon cancer   Colon cancer Sister    Cancer Brother 92       colon cancer   Colon cancer Brother    Cancer Brother        prostate cancer, deceased   Anesthesia problems Neg Hx    Hypotension Neg Hx    Malignant hyperthermia Neg Hx    Pseudochol deficiency Neg Hx    Breast cancer Neg Hx    Ovarian cancer Neg Hx    Endometrial cancer Neg Hx    Pancreatic cancer Neg Hx    Prostate cancer Neg Hx     Social History   Socioeconomic History   Marital status: Married    Spouse name: Not on file   Number of children: 5   Years of education: Not on file   Highest education level: Not on file  Occupational History   Not on file  Tobacco Use   Smoking status: Former    Current packs/day: 0.00    Types: Cigarettes    Quit date: 01/10/1984    Years since quitting: 40.2   Smokeless tobacco: Never   Tobacco comments:    quit 30+yrs ago  Vaping Use   Vaping status: Never Used  Substance and Sexual Activity   Alcohol use: No   Drug use: No   Sexual activity: Yes    Birth control/protection: Post-menopausal    Comment: menarche age 39, P5,, 1st pregancy 49, menop 33, no HRT  Other Topics Concern   Not on file  Social History Narrative   Not on file   Social Drivers of Health   Financial Resource Strain: Medium Risk (05/31/2023)   Overall Financial Resource Strain (CARDIA)    Difficulty of Paying Living Expenses: Somewhat hard  Food Insecurity: No Food Insecurity (05/31/2023)   Hunger Vital Sign    Worried About Running Out of Food in the Last Year: Never true    Ran Out of Food in the Last Year: Never true  Transportation Needs: No Transportation Needs (05/31/2023)   PRAPARE - Administrator, Civil Service (Medical): No    Lack of Transportation (Non-Medical): No  Physical Activity: Inactive (05/31/2023)   Exercise Vital Sign    Days of  Exercise per Week: 0 days    Minutes of Exercise per Session: 0 min  Stress: No Stress Concern Present (05/31/2023)   [employer] of Occupational Health - Occupational Stress Questionnaire    Feeling of Stress : Only a little  Social Connections: Socially Integrated (05/31/2023)   Social Connection and Isolation Panel [NHANES]    Frequency of Communication with Friends and Family: More than three times a week    Frequency of Social Gatherings with Friends and Family: More than  three times a week    Attends Religious Services: More than 4 times per year    Active Member of Clubs or Organizations: Yes    Attends Banker Meetings: Never    Marital Status: Married    Current Medications:  Current Outpatient Medications:    Capsaicin-Menthol-Methyl Sal (CAPSAICIN-METHYL SAL-MENTHOL) 0.025-1-12 % CREA, Apply to pain in legs and knees as needed. Do not get in the eyes., Disp: 56.6 g, Rfl: 3   gabapentin (NEURONTIN) 300 MG capsule, Take 1 capsule (300 mg total) by mouth 3 (three) times daily., Disp: 90 capsule, Rfl: 6   losartan (COZAAR) 50 MG tablet, Take 1 tablet (50 mg total) by mouth daily., Disp: 90 tablet, Rfl: 3   potassium chloride SA (KLOR-CON M) 20 MEQ tablet, Take 1 tablet (20 mEq total) by mouth once for 1 dose., Disp: 14 tablet, Rfl: 0   rivaroxaban (XARELTO) 2.5 MG TABS tablet, Take 1 tablet (2.5 mg total) by mouth daily. Take one time per day., Disp: 30 tablet, Rfl: 5   rosuvastatin (CRESTOR) 40 MG tablet, Take 1 tablet (40 mg total) by mouth daily., Disp: 90 tablet, Rfl: 3  Review of Systems: Denies appetite changes, fevers, chills, fatigue, unexplained weight changes. Denies hearing loss, neck lumps or masses, mouth sores, ringing in ears or voice changes. Denies cough or wheezing.  Denies shortness of breath. Denies chest pain or palpitations. Denies leg swelling. Denies abdominal distention, pain, blood in stools, constipation, diarrhea, nausea, vomiting, or  early satiety. Denies pain with intercourse, dysuria, frequency, hematuria or incontinence. Denies hot flashes, pelvic pain, vaginal bleeding or vaginal discharge.   Denies joint pain, back pain or muscle pain/cramps. Denies itching, rash, or wounds. Denies dizziness, headaches, numbness or seizures. Denies swollen lymph nodes or glands, denies easy bruising or bleeding. Denies anxiety, depression, confusion, or decreased concentration.  Physical Exam: BP (!) 144/64 (BP Location: Left Arm, Patient Position: Sitting) Comment: Notified RN  Pulse 69   Temp 98.9 F (37.2 C) (Oral)   Resp 20   Wt 194 lb 3.2 oz (88.1 kg)   SpO2 96%   BMI 30.42 kg/m  General: Alert, oriented, no acute distress. HEENT: Normocephalic, atraumatic, sclera anicteric. Chest: Clear to auscultation bilaterally.  No wheezes or rhonchi. Cardiovascular: Regular rate and rhythm, no murmurs. Abdomen: soft, nontender.  Normoactive bowel sounds.  No masses or hepatosplenomegaly appreciated.  Well-healed incisions. Extremities: Grossly normal range of motion.  Warm, well perfused.  Trace edema bilaterally. Skin: No rashes or lesions noted. Lymphatics: No cervical, supraclavicular, or inguinal adenopathy. GU: Normal appearing external genitalia without erythema, excoriation, or lesions.  Speculum exam reveals moderately atrophic vaginal mucosa, cuff intact, no visible lesions, no bladder discharge.  Bimanual exam reveals cuff intact, no nodularity or masses.  Rectovaginal exam confirms these findings.  Laboratory & Radiologic Studies: None new  Assessment & Plan: Yvonne [name] is a 78 y.o. 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 Surgery 04/2023. 95% MI, LVI+. MMR intact. P53 WT. ER+. Adjuvant radiation was recommended given high risk features, patient declined.    Patient is overall doing well and is NED on exam today.    Given high risk features, we will continue with surveillance visits every 3 months.  We have previously discussed CT imaging given her high risk disease and no adjuvant treatment.  She has not been interested in doing this to date.  We reviewed signs and symptoms that  would be concerning for cancer recurrence, and I stressed the importance of calling if she develops any of these.  20 minutes of total time was spent for this patient encounter, including preparation, face-to-face counseling with the patient and coordination of care, and documentation of the encounter.  Eugene Garnet, MD  Division of Gynecologic Oncology  Department of Obstetrics and Gynecology  [facility]

## 2024-04-28 ENCOUNTER — Telehealth: Payer: Self-pay | Admitting: Student

## 2024-04-28 NOTE — Telephone Encounter (Unsigned)
 Per info below the patient has already been contacted by the [facility] on two occasions and has been left messages.    03/09/2024-2nd attempt left vm-tm 03/01/24- 1ST ATTEMPT/ LMOM/ FJ   [facility] [address] [place], , [place] [address]  2.0 mi [phone]    Copied from CRM [id number]. Topic: Appointments - Scheduling Inquiry for Clinic >> Apr 28, 2024  9:52 AM Blair Bumpers wrote: Reason for CRM: Patient called in stating that the doctor told her that he would get her scheduled for a DEXA bone scan but she has not heard anything yet. Please give patient a call to get her scheduled. CB #: [phone].

## 2024-06-15 ENCOUNTER — Encounter: Payer: Self-pay | Admitting: Gynecologic Oncology

## 2024-06-15 ENCOUNTER — Inpatient Hospital Stay: Attending: Gynecologic Oncology | Admitting: Gynecologic Oncology

## 2024-06-15 VITALS — BP 146/72 | HR 75 | Temp 98.4°F | Resp 17 | Ht 67.0 in | Wt 204.8 lb

## 2024-06-15 DIAGNOSIS — Z79890 Hormone replacement therapy (postmenopausal): Secondary | ICD-10-CM | POA: Diagnosis not present

## 2024-06-15 DIAGNOSIS — Z8542 Personal history of malignant neoplasm of other parts of uterus: Secondary | ICD-10-CM

## 2024-06-15 DIAGNOSIS — C541 Malignant neoplasm of endometrium: Secondary | ICD-10-CM | POA: Insufficient documentation

## 2024-06-15 DIAGNOSIS — Z9079 Acquired absence of other genital organ(s): Secondary | ICD-10-CM | POA: Insufficient documentation

## 2024-06-15 DIAGNOSIS — D6862 Lupus anticoagulant syndrome: Secondary | ICD-10-CM | POA: Insufficient documentation

## 2024-06-15 DIAGNOSIS — Z853 Personal history of malignant neoplasm of breast: Secondary | ICD-10-CM | POA: Insufficient documentation

## 2024-06-15 DIAGNOSIS — Z86718 Personal history of other venous thrombosis and embolism: Secondary | ICD-10-CM | POA: Diagnosis not present

## 2024-06-15 DIAGNOSIS — Z90722 Acquired absence of ovaries, bilateral: Secondary | ICD-10-CM | POA: Insufficient documentation

## 2024-06-15 DIAGNOSIS — Z90710 Acquired absence of both cervix and uterus: Secondary | ICD-10-CM | POA: Diagnosis not present

## 2024-06-15 NOTE — Progress Notes (Signed)
 Gynecologic Oncology Return Clinic Visit  06/15/24  Reason for Visit: surveillance   Treatment History: She presented to the ED with vaginal bleeding and hematochezia after recently starting Xarelto . She was seen for outpatient follow-up by OBGYN after pelvic ultrasound during her short hospitalization showed a uterus measuring 9 x 5 x 6 cm with a large central 6.2 cm endometrial vs myometrial mass. 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 The patient was started on 10 mg Provera  at her follow-up visit on 5/6.    She has a history of breast cancer, provoked DVT (and possible a PE at the time of a pregnancy) and an unprovoked DVT in 2021. Work up revealed positive lupus anticoagulant.  She had initially been on Eliquis , was transition to Xarelto .  She was on prophylactic dosing until recently around the time of her vaginal bleeding.  Lower extremity Doppler in late April was negative for DVT on the right.  In terms of her breast cancer, this was stage IIb invasive ductal carcinoma, grade 2, ER/PR positive, HER2 negative.  She underwent modified radical mastectomy in 2013 followed by 1 cycle of adjuvant chemotherapy.  She declined further chemotherapy as well as radiation.  She was treated with anastrozole  for 5 years, completed in late 2018.  In terms of her type 2 diabetes, the patient takes metformin  twice daily.  Blood glucose at home is typically between 100-110.  Her last hemoglobin A1c in early April was 5.9%.   05/05/23: Robotic-assisted laparoscopic total hysterectomy with bilateral salpingoophorectomy, SLN biopsy, umbilical hernia repair  Path: grade 2, endometrioid, 95% MI, +LVI (>5 foci), negative SLNs MMRp, p53 WT  Interval History: Doing well.  Denies any vaginal bleeding or discharge.  Denies abdominal or pelvic pain.  Reports baseline bowel bladder function.  Her daughter was just diagnosed with breast cancer, has appointment with oncologist on Monday.  Past  Medical/Surgical History: Past Medical History:  Diagnosis Date   Acute deep vein thrombosis (DVT) of popliteal vein of right lower extremity (HCC) 09/12/2020   AKI (acute kidney injury) (HCC) 02/09/2013   Arthritis    Breast cancer (HCC) 04/2012   right breast   Bruises easily    Cancer (HCC) 03/25/2012   right breast   Chronic back pain    arthritis   Diabetes mellitus without complication (HCC)    Elevated cholesterol    takes Crestor  daily   Hemorrhoids    History of chemotherapy    x 1, pt unable to tolerate, on PO chemotherpay   Hypertension    takes Maxzide  daily    Phlebitis 70yrs ago   hx of-   Seasonal allergies    takes Zyrtec daily and Nasal Spray prn    Past Surgical History:  Procedure Laterality Date   APPENDECTOMY  5yrs ago   BREAST EXCISIONAL BIOPSY Left    BREAST SURGERY  04/27/12   Right mod mastectomy, ER/PR +, HER2 -   BREAST SURGERY  04/27/12   Left Breast NL lumpectomy-no malignancy   COLONOSCOPY     MASTECTOMY Right 2013   PORT-A-CATH REMOVAL  11/24/2012   Procedure: REMOVAL PORT-A-CATH;  Surgeon: Redell Faith, DO;  Location: [facility];  Service: General;  Laterality: Left;   PORTACATH PLACEMENT  04/26/2012   Procedure: INSERTION PORT-A-CATH;  Surgeon: Redell Faith, DO;  Location: [facility];  Service: General;  Laterality: Left;  Started at 1746.   ROBOTIC ASSISTED TOTAL HYSTERECTOMY WITH BILATERAL SALPINGO OOPHERECTOMY N/A 05/05/2023   Procedure: XI  ROBOTIC ASSISTED TOTAL HYSTERECTOMY WITH BILATERAL SALPINGO OOPHORECTOMY;  Surgeon: Viktoria Comer SAUNDERS, MD;  Location: [facility];  Service: Gynecology;  Laterality: N/A;   SENTINEL NODE BIOPSY N/A 05/05/2023   Procedure: SENTINEL NODE BIOPSY;  Surgeon: Viktoria Comer SAUNDERS, MD;  Location: [facility];  Service: Gynecology;  Laterality: N/A;   TUBAL LIGATION  66yrs ago   UMBILICAL HERNIA REPAIR N/A 05/05/2023   Procedure: HERNIA REPAIR UMBILICAL ADULT;  Surgeon: Viktoria Comer SAUNDERS, MD;  Location: [facility];   Service: Gynecology;  Laterality: N/A;    Family History  Problem Relation Age of Onset   Cancer Mother        ovarian cancer vs uterine   Leukemia Father    Cancer Father        leukemia   Cancer Sister 9       colon cancer   Colon cancer Sister    Cancer Brother 47       colon cancer   Colon cancer Brother    Cancer Brother        prostate cancer, deceased   Anesthesia problems Neg Hx    Hypotension Neg Hx    Malignant hyperthermia Neg Hx    Pseudochol deficiency Neg Hx    Breast cancer Neg Hx    Ovarian cancer Neg Hx    Endometrial cancer Neg Hx    Pancreatic cancer Neg Hx    Prostate cancer Neg Hx     Social History   Socioeconomic History   Marital status: Married    Spouse name: Not on file   Number of children: 5   Years of education: Not on file   Highest education level: Not on file  Occupational History   Not on file  Tobacco Use   Smoking status: Former    Current packs/day: 0.00    Types: Cigarettes    Quit date: 01/10/1984    Years since quitting: 40.4   Smokeless tobacco: Never   Tobacco comments:    quit 30+yrs ago  Vaping Use   Vaping status: Never Used  Substance and Sexual Activity   Alcohol  use: No   Drug use: No   Sexual activity: Yes    Birth control/protection: Post-menopausal    Comment: menarche age 85, P5,, 1st pregancy 2, menop 67, no HRT  Other Topics Concern   Not on file  Social History Narrative   Not on file   Social Drivers of Health   Financial Resource Strain: Medium Risk (05/31/2023)   Overall Financial Resource Strain (CARDIA)    Difficulty of Paying Living Expenses: Somewhat hard  Food Insecurity: No Food Insecurity (05/31/2023)   Hunger Vital Sign    Worried About Running Out of Food in the Last Year: Never true    Ran Out of Food in the Last Year: Never true  Transportation Needs: No Transportation Needs (05/31/2023)   PRAPARE - Administrator, Civil Service (Medical): No    Lack of Transportation  (Non-Medical): No  Physical Activity: Unknown (05/31/2023)   Exercise Vital Sign    Days of Exercise per Week: 0 days    Minutes of Exercise per Session: Not on file  Recent Concern: Physical Activity - Inactive (05/31/2023)   Exercise Vital Sign    Days of Exercise per Week: 0 days    Minutes of Exercise per Session: 0 min  Stress: No Stress Concern Present (05/31/2023)   [employer] of Occupational Health - Occupational Stress Questionnaire  Feeling of Stress : Only a little  Social Connections: Socially Integrated (05/31/2023)   Social Connection and Isolation Panel    Frequency of Communication with Friends and Family: More than three times a week    Frequency of Social Gatherings with Friends and Family: More than three times a week    Attends Religious Services: More than 4 times per year    Active Member of [employer] or Organizations: Yes    Attends Banker Meetings: Never    Marital Status: Married    Current Medications:  Current Outpatient Medications:    gabapentin  (NEURONTIN ) 300 MG capsule, Take 1 capsule (300 mg total) by mouth 3 (three) times daily., Disp: 90 capsule, Rfl: 6   losartan  (COZAAR ) 50 MG tablet, Take 1 tablet (50 mg total) by mouth daily., Disp: 90 tablet, Rfl: 3   rivaroxaban  (XARELTO ) 2.5 MG TABS tablet, Take 1 tablet (2.5 mg total) by mouth daily. Take one time per day., Disp: 30 tablet, Rfl: 5   rosuvastatin  (CRESTOR ) 40 MG tablet, Take 1 tablet (40 mg total) by mouth daily., Disp: 90 tablet, Rfl: 3   Capsaicin-Menthol-Methyl Sal (CAPSAICIN-METHYL SAL-MENTHOL) 0.025-1-12 % CREA, Apply to pain in legs and knees as needed. Do not get in the eyes. (Patient not taking: Reported on 06/13/2024), Disp: 56.6 g, Rfl: 3   potassium chloride  SA (KLOR-CON  M) 20 MEQ tablet, Take 1 tablet (20 mEq total) by mouth once for 1 dose. (Patient not taking: Reported on 06/13/2024), Disp: 14 tablet, Rfl: 0  Review of Systems: Denies appetite changes, fevers,  chills, fatigue, unexplained weight changes. Denies hearing loss, neck lumps or masses, mouth sores, ringing in ears or voice changes. Denies cough or wheezing.  Denies shortness of breath. Denies chest pain or palpitations. Denies leg swelling. Denies abdominal distention, pain, blood in stools, constipation, diarrhea, nausea, vomiting, or early satiety. Denies pain with intercourse, dysuria, frequency, hematuria or incontinence. Denies hot flashes, pelvic pain, vaginal bleeding or vaginal discharge.   Denies joint pain, back pain or muscle pain/cramps. Denies itching, rash, or wounds. Denies dizziness, headaches, numbness or seizures. Denies swollen lymph nodes or glands, denies easy bruising or bleeding. Denies anxiety, depression, confusion, or decreased concentration.  Physical Exam: BP (!) 155/63 (BP Location: Left Arm, Patient Position: Sitting) Comment: CMA notified  Pulse 75   Temp 98.4 F (36.9 C) (Oral)   Resp 17   Ht 5' 7 (1.702 m)   Wt 204 lb 12.8 oz (92.9 kg)   SpO2 95%   BMI 32.08 kg/m  General: Alert, oriented, no acute distress. HEENT: Normocephalic, atraumatic, sclera anicteric. Chest: Clear to auscultation bilaterally.  No wheezes or rhonchi. Cardiovascular: Regular rate and rhythm, no murmurs. Abdomen: soft, nontender.  Normoactive bowel sounds.  No masses or hepatosplenomegaly appreciated.  Well-healed incisions. Extremities: Grossly normal range of motion.  Warm, well perfused.  Trace edema bilaterally. Skin: No rashes or lesions noted. Lymphatics: No cervical, supraclavicular, or inguinal adenopathy. GU: Normal appearing external genitalia without erythema, excoriation, or lesions.  Speculum exam reveals moderately atrophic vaginal mucosa, cuff intact, no visible lesions, no bladder discharge.  Bimanual exam reveals cuff intact, no nodularity or masses.  Rectovaginal exam confirms these findings.  Laboratory & Radiologic Studies: None new  Assessment &  Plan: Yvonne [name] is a 78 y.o. 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 Surgery 04/2023. 95% MI, LVI+. MMR intact. P53 WT. ER+. Adjuvant radiation was recommended given  high risk features, patient declined.    Patient is overall doing well and is NED on exam today.   Given high risk features, we will continue with surveillance visits every 3 months.  We have previously discussed CT imaging given her high risk disease and no adjuvant treatment.  She has not been interested in doing this to date given cost.  We reviewed signs and symptoms that would be concerning for cancer recurrence, and I stressed the importance of calling if she develops any of these.  20 minutes of total time was spent for this patient encounter, including preparation, face-to-face counseling with the patient and coordination of care, and documentation of the encounter.  Comer Dollar, MD  Division of Gynecologic Oncology  Department of Obstetrics and Gynecology  [facility]

## 2024-06-15 NOTE — Patient Instructions (Signed)
 It was good to see you today.  I do not see or feel any evidence of cancer recurrence on your exam.  I will see you for follow-up in 3 months.  As always, if you develop any new and concerning symptoms before your next visit, please call to see me sooner.

## 2024-06-21 ENCOUNTER — Telehealth: Payer: Self-pay

## 2024-06-21 NOTE — Telephone Encounter (Signed)
 Spoke with patient and had appointment moved from 9/25 to 10/2 per Dr Viktoria not being in the office on evening of 9/25.Yvonne [name] confirmed

## 2024-07-05 ENCOUNTER — Telehealth: Payer: Self-pay

## 2024-07-05 NOTE — Telephone Encounter (Signed)
 Patient is requesting a refill on the following medication: Xarelto  2.5 mg and Gabapentin  300 mg to be sent to [facility].  Tomoko Sandra N. Tomie, LPN [facility] Annual Wellness Team Direct Dial: [phone]

## 2024-07-11 ENCOUNTER — Other Ambulatory Visit: Payer: Self-pay | Admitting: Student

## 2024-07-11 DIAGNOSIS — M79605 Pain in left leg: Secondary | ICD-10-CM

## 2024-07-24 ENCOUNTER — Emergency Department (HOSPITAL_COMMUNITY)

## 2024-07-24 ENCOUNTER — Emergency Department (HOSPITAL_COMMUNITY)
Admission: EM | Admit: 2024-07-24 | Discharge: 2024-07-24 | Disposition: A | Discharge status: Home / Self Care | Attending: Emergency Medicine | Admitting: Emergency Medicine

## 2024-07-24 ENCOUNTER — Other Ambulatory Visit: Payer: Self-pay

## 2024-07-24 ENCOUNTER — Encounter (HOSPITAL_COMMUNITY): Payer: Self-pay

## 2024-07-24 ENCOUNTER — Ambulatory Visit: Admitting: Student

## 2024-07-24 DIAGNOSIS — E119 Type 2 diabetes mellitus without complications: Secondary | ICD-10-CM | POA: Insufficient documentation

## 2024-07-24 DIAGNOSIS — U071 COVID-19: Secondary | ICD-10-CM | POA: Insufficient documentation

## 2024-07-24 DIAGNOSIS — I10 Essential (primary) hypertension: Secondary | ICD-10-CM | POA: Diagnosis not present

## 2024-07-24 DIAGNOSIS — R059 Cough, unspecified: Secondary | ICD-10-CM | POA: Diagnosis not present

## 2024-07-24 DIAGNOSIS — Z87891 Personal history of nicotine dependence: Secondary | ICD-10-CM | POA: Diagnosis not present

## 2024-07-24 DIAGNOSIS — Z853 Personal history of malignant neoplasm of breast: Secondary | ICD-10-CM | POA: Diagnosis not present

## 2024-07-24 DIAGNOSIS — Z79899 Other long term (current) drug therapy: Secondary | ICD-10-CM | POA: Diagnosis not present

## 2024-07-24 DIAGNOSIS — Z7901 Long term (current) use of anticoagulants: Secondary | ICD-10-CM | POA: Insufficient documentation

## 2024-07-24 LAB — RESP PANEL BY RT-PCR (RSV, FLU A&B, COVID)  RVPGX2
Influenza A by PCR: NEGATIVE
Influenza B by PCR: NEGATIVE
Resp Syncytial Virus by PCR: NEGATIVE
SARS Coronavirus 2 by RT PCR: POSITIVE — AB

## 2024-07-24 MED ORDER — BENZONATATE 100 MG PO CAPS: 100.0000 mg | ORAL_CAPSULE | Freq: Three times a day (TID) | ORAL | 0 refills | Status: DC | PRN

## 2024-07-24 MED ORDER — BENZONATATE 100 MG PO CAPS
100.0000 mg | ORAL_CAPSULE | Freq: Four times a day (QID) | ORAL | 1 refills | Status: DC | PRN
  Filled 2024-07-24: qty 30, 8d supply, fill #0

## 2024-07-24 NOTE — Discharge Instructions (Addendum)
 Please follow up with your primary doctor and return if your symptoms worsen.

## 2024-07-24 NOTE — ED Triage Notes (Signed)
 Pt c/o cold symptoms since Saturday, states not getting better; taking cold and flu medicine at home; endorses rhinorrhea, cough; denies fevers; no known sick contacts; denies pain

## 2024-07-24 NOTE — ED Provider Notes (Addendum)
 [place] EMERGENCY DEPARTMENT AT [facility] Provider Note  CSN: [id number] Arrival date & time: 07/24/24 9181  Chief Complaint(s) No chief complaint on file.  HPI Yvonne [name] is a 78 y.o. female history of DVT on Xarelto , diabetes, hyperlipidemia, hypertension presenting to the emergency department with cough.  Patient reports cough for the past 2 days associated with runny nose.  Denies any fevers or chills.  Denies productive cough.  Denies any body aches.  Denies any nausea, vomiting, abdominal pain, chest pain.  Denies any back pain.  Denies any sick contacts.  Denies sore throat.  Symptoms mild.  Came to ER because she wanted to be evaluated for pneumonia   Past Medical History Past Medical History:  Diagnosis Date   Acute deep vein thrombosis (DVT) of popliteal vein of right lower extremity (HCC) 09/12/2020   AKI (acute kidney injury) (HCC) 02/09/2013   Arthritis    Breast cancer (HCC) 04/2012   right breast   Bruises easily    Cancer (HCC) 03/25/2012   right breast   Chronic back pain    arthritis   Diabetes mellitus without complication (HCC)    Elevated cholesterol    takes Crestor  daily   Hemorrhoids    History of chemotherapy    x 1, pt unable to tolerate, on PO chemotherpay   Hypertension    takes Maxzide  daily    Phlebitis 68yrs ago   hx of-   Seasonal allergies    takes Zyrtec daily and Nasal Spray prn   Patient Active Problem List   Diagnosis Date Noted   Meralgia paresthetica 02/29/2024   Lupus anticoagulant positive 12/03/2023   Hearing loss 12/01/2023   Left leg pain 09/01/2023   Back pain 08/11/2023   Increased urinary frequency 08/11/2023   Umbilical hernia without obstruction and without gangrene 05/05/2023   Endometrial cancer (HCC) 05/05/2023   Endometrial adenocarcinoma (HCC) 04/17/2023   Cellulitis of right lower extremity 04/17/2023   History of DVT (deep vein thrombosis) 04/17/2023   Right leg pain 04/14/2023    Hyperlipidemia 01/29/2023   Hypokalemia 01/29/2023   Chronic pain 01/03/2023   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 Authorizing Provider  benzonatate  (TESSALON ) 100 MG capsule Take 1 capsule (100 mg total) by mouth 3 (three) times daily as needed for cough. 07/24/24  Yes Francesca Elsie CROME, MD  Capsaicin-Menthol-Methyl Sal (CAPSAICIN-METHYL SAL-MENTHOL) 0.025-1-12 % CREA Apply to pain in legs and knees as needed. Do not get in the eyes. Patient not taking: Reported on 06/13/2024 02/29/24   Harrie Bruckner, DO  gabapentin  (NEURONTIN ) 300 MG capsule TAKE 3 CAPSULES(900 MG) BY MOUTH AT BEDTIME 07/12/24   Harrie Bruckner, DO  losartan  (COZAAR ) 50 MG tablet Take 1 tablet (50 mg total) by mouth daily. 02/29/24 02/28/25  Harrie Bruckner, DO  potassium chloride  SA (KLOR-CON  M) 20 MEQ tablet Take 1 tablet (20 mEq total) by mouth once for 1 dose. Patient not taking: Reported on 06/13/2024 10/15/23 03/17/24  Masters, Izetta, DO  rivaroxaban  (XARELTO ) 2.5 MG TABS tablet Take 1 tablet (2.5 mg total) by mouth daily.  Take one time per day. 12/07/23   Harrie Bruckner, DO  rosuvastatin  (CRESTOR ) 40 MG tablet Take 1 tablet (40 mg total) by mouth daily. 02/29/24   Harrie Bruckner, DO                                                                                                                                    Past Surgical History Past Surgical History:  Procedure Laterality Date   APPENDECTOMY  72yrs ago   BREAST EXCISIONAL BIOPSY Left    BREAST  SURGERY  04/27/12   Right mod mastectomy, ER/PR +, HER2 -   BREAST SURGERY  04/27/12   Left Breast NL lumpectomy-no malignancy   COLONOSCOPY     MASTECTOMY Right 2013   PORT-A-CATH REMOVAL  11/24/2012   Procedure: REMOVAL PORT-A-CATH;  Surgeon: Redell Faith, DO;  Location: [facility];  Service: General;  Laterality: Left;   PORTACATH PLACEMENT  04/26/2012   Procedure: INSERTION PORT-A-CATH;  Surgeon: Redell Faith, DO;  Location: [facility];  Service: General;  Laterality: Left;  Started at 1746.   ROBOTIC ASSISTED TOTAL HYSTERECTOMY WITH BILATERAL SALPINGO OOPHERECTOMY N/A 05/05/2023   Procedure: XI ROBOTIC ASSISTED TOTAL HYSTERECTOMY WITH BILATERAL SALPINGO OOPHORECTOMY;  Surgeon: Viktoria Comer SAUNDERS, MD;  Location: [facility];  Service: Gynecology;  Laterality: N/A;   SENTINEL NODE BIOPSY N/A 05/05/2023   Procedure: SENTINEL NODE BIOPSY;  Surgeon: Viktoria Comer SAUNDERS, MD;  Location: [facility];  Service: Gynecology;  Laterality: N/A;   TUBAL LIGATION  2yrs ago   UMBILICAL HERNIA REPAIR N/A 05/05/2023   Procedure: HERNIA REPAIR UMBILICAL ADULT;  Surgeon: Viktoria Comer SAUNDERS, MD;  Location: [facility];  Service: Gynecology;  Laterality: N/A;   Family History Family History  Problem Relation Age of Onset   Cancer Mother        ovarian cancer vs uterine   Leukemia Father    Cancer Father        leukemia   Cancer Sister 36       colon cancer   Colon cancer Sister    Cancer Brother 74       colon cancer   Colon cancer Brother    Cancer Brother        prostate cancer, deceased   Anesthesia problems Neg Hx    Hypotension Neg Hx    Malignant hyperthermia Neg Hx    Pseudochol deficiency Neg Hx    Breast cancer Neg Hx    Ovarian cancer Neg Hx    Endometrial cancer Neg Hx    Pancreatic cancer Neg Hx    Prostate cancer Neg Hx     Social History Social History   Tobacco Use   Smoking status: Former    Current packs/day: 0.00    Types: Cigarettes    Quit date: 01/10/1984    Years since  quitting: 40.5   Smokeless tobacco: Never   Tobacco comments:  quit 30+yrs ago  Vaping Use   Vaping status: Never Used  Substance Use Topics   Alcohol  use: No   Drug use: No   Allergies Penicillins  Review of Systems Review of Systems  All other systems reviewed and are negative.   Physical Exam Vital Signs  I have reviewed the triage vital signs BP (!) 192/83 (BP Location: Left Arm)   Pulse 91   Temp 98.9 F (37.2 C)   Resp 17   SpO2 94%  Physical Exam Vitals and nursing note reviewed.  Constitutional:      General: She is not in acute distress.    Appearance: She is well-developed.  HENT:     Head: Normocephalic and atraumatic.     Mouth/Throat:     Mouth: Mucous membranes are moist.  Eyes:     Pupils: Pupils are equal, round, and reactive to light.  Cardiovascular:     Rate and Rhythm: Normal rate and regular rhythm.     Heart sounds: No murmur heard. Pulmonary:     Effort: Pulmonary effort is normal. No respiratory distress.     Breath sounds: Normal breath sounds.  Abdominal:     General: Abdomen is flat.     Palpations: Abdomen is soft.     Tenderness: There is no abdominal tenderness.  Musculoskeletal:        General: No tenderness.     Right lower leg: No edema.     Left lower leg: No edema.  Skin:    General: Skin is warm and dry.  Neurological:     General: No focal deficit present.     Mental Status: She is alert. Mental status is at baseline.  Psychiatric:        Mood and Affect: Mood normal.        Behavior: Behavior normal.     ED Results and Treatments Labs (all labs ordered are listed, but only abnormal results are displayed) Labs Reviewed  RESP PANEL BY RT-PCR (RSV, FLU A&B, COVID)  RVPGX2 - Abnormal; Notable for the following components:      Result Value   SARS Coronavirus 2 by RT PCR POSITIVE (*)    All other components within normal limits                                                                                                                           Radiology DG Chest 2 View Result Date: 07/24/2024 CLINICAL DATA:  Cough EXAM: CHEST - 2 VIEW COMPARISON:  Chest radiograph dated 11/30/2022 FINDINGS: Normal lung volumes. No focal consolidations. No pleural effusion or pneumothorax. The heart size and mediastinal contours are within normal limits. No acute osseous abnormality. Surgical clips project over the right inferolateral chest and right axilla. IMPRESSION: No active cardiopulmonary disease. Electronically Signed   By: Limin  Xu M.D.   On: 07/24/2024 09:32    Pertinent labs & imaging results that were available during my care of the patient  were reviewed by me and considered in my medical decision making (see MDM for details).  Medications Ordered in ED Medications - No data to display                                                                                                                                   Procedures Procedures  (including critical care time)  Medical Decision Making / ED Course   MDM:  78 year old presented to the emergency department with cough.  Patient overall well-appearing, physical exam reassuring.  Normal vital signs, no hypoxia.  Lungs clear on exam.  Low concern for dangerous cause of symptoms, suspect viral URI.  Lower concern for pneumonia but chest x-ray obtained which was negative.  COVID and flu swab was checked. COVID is positive and considered paxlovid however patient is on DOAC which interacts with this. Feel benefit of paxlovid is lower than risk of stopping DOAC.  Will discharge patient to home. All questions answered. Patient comfortable with plan of discharge. Return precautions discussed with patient and specified on the after visit summary.       Additional history obtained: -Additional history obtained from spouse -External records from outside source obtained and reviewed including: Chart review including previous notes, labs, imaging,  consultation notes including prior notes    Lab Tests: -I ordered, reviewed, and interpreted labs.   The pertinent results include:   Labs Reviewed  RESP PANEL BY RT-PCR (RSV, FLU A&B, COVID)  RVPGX2 - Abnormal; Notable for the following components:      Result Value   SARS Coronavirus 2 by RT PCR POSITIVE (*)    All other components within normal limits   Imaging Studies ordered: I ordered imaging studies including xr chest On my interpretation imaging demonstrates no acute process  I independently visualized and interpreted imaging. I agree with the radiologist interpretation   Medicines ordered and prescription drug management: Meds ordered this encounter  Medications   benzonatate  (TESSALON ) 100 MG capsule    Sig: Take 1 capsule (100 mg total) by mouth 3 (three) times daily as needed for cough.    Dispense:  21 capsule    Refill:  0    -I have reviewed the patients home medicines and have made adjustments as needed   Social Determinants of Health:  Diagnosis or treatment significantly limited by social determinants of health: obesity   Co morbidities that complicate the patient evaluation  Past Medical History:  Diagnosis Date   Acute deep vein thrombosis (DVT) of popliteal vein of right lower extremity (HCC) 09/12/2020   AKI (acute kidney injury) (HCC) 02/09/2013   Arthritis    Breast cancer (HCC) 04/2012   right breast   Bruises easily    Cancer (HCC) 03/25/2012   right breast   Chronic back pain    arthritis   Diabetes mellitus without complication (HCC)    Elevated cholesterol    takes Crestor  daily  Hemorrhoids    History of chemotherapy    x 1, pt unable to tolerate, on PO chemotherpay   Hypertension    takes Maxzide  daily    Phlebitis 76yrs ago   hx of-   Seasonal allergies    takes Zyrtec daily and Nasal Spray prn      Dispostion: Disposition decision including need for hospitalization was considered, and patient discharged from emergency  department.    Final Clinical Impression(s) / ED Diagnoses Final diagnoses:  COVID-19     This chart was dictated using voice recognition software.  Despite best efforts to proofread,  errors can occur which can change the documentation meaning.    Francesca Elsie CROME, MD 07/24/24 9060    Francesca Elsie CROME, MD 07/24/24 1050

## 2024-07-25 ENCOUNTER — Other Ambulatory Visit: Payer: Self-pay

## 2024-07-28 ENCOUNTER — Other Ambulatory Visit: Payer: Self-pay | Admitting: Student

## 2024-07-28 DIAGNOSIS — R760 Raised antibody titer: Secondary | ICD-10-CM

## 2024-07-31 ENCOUNTER — Ambulatory Visit: Payer: Self-pay

## 2024-07-31 ENCOUNTER — Other Ambulatory Visit: Payer: Self-pay

## 2024-07-31 ENCOUNTER — Ambulatory Visit (INDEPENDENT_AMBULATORY_CARE_PROVIDER_SITE_OTHER): Admitting: Student

## 2024-07-31 VITALS — BP 147/66 | HR 69 | Temp 98.2°F | Resp 20 | Ht 67.0 in | Wt 201.8 lb

## 2024-07-31 DIAGNOSIS — Z86718 Personal history of other venous thrombosis and embolism: Secondary | ICD-10-CM

## 2024-07-31 DIAGNOSIS — M79604 Pain in right leg: Secondary | ICD-10-CM

## 2024-07-31 NOTE — Progress Notes (Signed)
 CC: Acute visit for right leg pain  HPI:  [name] is a 78 y.o. female living with a history stated below and presents today for right leg pain..   Reports 3-4 days of right leg pain worsened with weightbearing. No recent surgery. Intermittent tingling, no numbness. No bowel or bladder incontinence.  History of chronic DVT; previously declined Xarelto  due to cost but has been taking it for about 1 month.   Denies chest pain or shortness of breath.  Please see problem based assessment and plan for additional details.  Past Medical History:  Diagnosis Date   Acute deep vein thrombosis (DVT) of popliteal vein of right lower extremity (HCC) 09/12/2020   AKI (acute kidney injury) (HCC) 02/09/2013   Arthritis    Breast cancer (HCC) 04/2012   right breast   Bruises easily    Cancer (HCC) 03/25/2012   right breast   Chronic back pain    arthritis   Diabetes mellitus without complication (HCC)    Elevated cholesterol    takes Crestor  daily   Hemorrhoids    History of chemotherapy    x 1, pt unable to tolerate, on PO chemotherpay   Hypertension    takes Maxzide  daily    Phlebitis 30yrs ago   hx of-   Seasonal allergies    takes Zyrtec daily and Nasal Spray prn    Current Outpatient Medications on File Prior to Visit  Medication Sig Dispense Refill   benzonatate  (TESSALON  PERLES) 100 MG capsule Take 1 capsule (100 mg total) by mouth every 6 (six) hours as needed for cough. 30 capsule 1   benzonatate  (TESSALON ) 100 MG capsule Take 1 capsule (100 mg total) by mouth 3 (three) times daily as needed for cough. 21 capsule 0   Capsaicin-Menthol-Methyl Sal (CAPSAICIN-METHYL SAL-MENTHOL) 0.025-1-12 % CREA Apply to pain in legs and knees as needed. Do not get in the eyes. (Patient not taking: Reported on 06/13/2024) 56.6 g 3   gabapentin  (NEURONTIN ) 300 MG capsule TAKE 3 CAPSULES(900 MG) BY MOUTH AT BEDTIME 90 capsule 6   losartan  (COZAAR ) 50 MG tablet Take 1 tablet (50 mg  total) by mouth daily. 90 tablet 3   potassium chloride  SA (KLOR-CON  M) 20 MEQ tablet Take 1 tablet (20 mEq total) by mouth once for 1 dose. (Patient not taking: Reported on 06/13/2024) 14 tablet 0   rosuvastatin  (CRESTOR ) 40 MG tablet Take 1 tablet (40 mg total) by mouth daily. 90 tablet 3   XARELTO  2.5 MG TABS tablet TAKE 1 TABLET(2.5 MG) BY MOUTH DAILY 30 tablet 5   No current facility-administered medications on file prior to visit.    Review of Systems: ROS negative except for what is noted on the assessment and plan.  Vitals:   07/31/24 1430 07/31/24 1435  BP: (!) 154/66 (!) 147/66  Pulse: 71 69  Resp: 20   Temp: 98.2 F (36.8 C)   TempSrc: Oral   SpO2: 99%   Weight: 201 lb 12.8 oz (91.5 kg)   Height: 5' 7 (1.702 m)     Physical Exam: Constitutional: Not in acute distress, hard of hearing in left ear. Pulmonary/Chest: Clear bilateral lungs Extremity: warm, and dry, palpable pulses. Right leg: Poorly localized pain with mild tenderness below the patella. No redness or effusion. Skin warm, pedal pulses normal. Left leg: No redness, swelling, or tenderness. Skin warm and dry, pedal pulses normal.   Assessment & Plan:   Patient discussed with Dr. Trudy  Assessment &  Plan Right leg pain Patient reports a few days of worsening right leg pain, worsened by weightbearing, likely musculoskeletal in origin. Infectious etiology is unlikely given absence of erythema or effusion in the right knee.  Due to history of DVT and recent prolonged anticoagulation interruption, vascular imaging is warranted to rule out venous thrombosis. - Stat VAS US  RLE ordered. - Will get x-ray of right knee. - Advised to use Tylenol , and Voltaren  gel for the knee pain. History of DVT (deep vein thrombosis) History of + lupus anticoagulant. Cardiolipin and anti-beta2 glycoprotein were normal. She has had multiple unprovoked DVT, last in 2021.  - Continue with Xarelto  5 mg daily.   Orders Placed  This Encounter  Procedures   DG Knee Complete 4 Views Right   CBC   VAS US  LOWER EXTREMITY VENOUS (DVT)    Yvonne Sandhoff, MD [facility] Internal Medicine, PGY-2  Date 08/01/2024 Time 4:56 PM

## 2024-07-31 NOTE — Assessment & Plan Note (Addendum)
 History of + lupus anticoagulant. Cardiolipin and anti-beta2 glycoprotein were normal. She has had multiple unprovoked DVT, last in 2021.  - Continue with Xarelto  5 mg daily.

## 2024-07-31 NOTE — Telephone Encounter (Signed)
 FYI Only or Action Required?: FYI only for provider.  Patient was last seen in primary care on 02/29/2024 by Harrie Bruckner, DO.  Called Nurse Triage reporting Leg Pain.  Symptoms began several days ago.  Interventions attempted: OTC medications: tylenol .  Symptoms are: gradually worsening.  Triage Disposition: See HCP Within 4 Hours (Or PCP Triage)  Patient/caregiver understands and will follow disposition?: Yes      Copied from CRM #[id number]. Topic: Clinical - Red Word Triage >> Jul 31, 2024 12:02 PM Fredrica W wrote: Red Word that prompted transfer to Nurse Triage: Right leg and knee hurt so bad can hardly walk Reason for Disposition  [1] SEVERE pain (e.g., excruciating, unable to do any normal activities) AND [2] not improved after 2 hours of pain medicine  Answer Assessment - Initial Assessment Questions 1. ONSET: When did the pain start?      A couple days ago  2. LOCATION: Where is the pain located?      Right leg 3. PAIN: How bad is the pain?    (Scale 1-10; or mild, moderate, severe)     Can't rest 9/10 4. WORK OR EXERCISE: Has there been any recent work or exercise that involved this part of the body?      no 5. CAUSE: What do you think is causing the leg pain?     unknown 6. OTHER SYMPTOMS: Do you have any other symptoms? (e.g., chest pain, back pain, breathing difficulty, swelling, rash, fever, numbness, weakness)     no  Protocols used: Leg Pain-A-AH

## 2024-07-31 NOTE — Telephone Encounter (Signed)
 Pt has an appt today @ 1515PM w/Dr Celestina.

## 2024-07-31 NOTE — Telephone Encounter (Signed)
 Medication sent to pharmacy

## 2024-07-31 NOTE — Patient Instructions (Signed)
 It was a pleasure taking care of you today!    1.  For your leg pain, will get imaging and ultrasound to figure out what is causing the pain.  2.  Continue to use the Voltaren  gel and gabapentin  for pain.  3.  I will call you with the results of your imaging results once is completed.  I have ordered the following labs for you:  Lab Orders  No laboratory test(s) ordered today      Follow up: 3-4 months   Should you have any questions or concerns please call the internal medicine clinic at [phone].     Missy Sandhoff, MD  [facility]

## 2024-07-31 NOTE — Assessment & Plan Note (Signed)
>>  ASSESSMENT AND PLAN FOR HISTORY OF DVT (DEEP VEIN THROMBOSIS) WRITTEN ON 08/01/2024  4:58 PM BY CELESTINA CZAR, MD  History of + lupus anticoagulant. Cardiolipin and anti-beta2 glycoprotein were normal. She has had multiple unprovoked DVT, last in 2021.  - Continue with Xarelto  5 mg daily.

## 2024-08-01 ENCOUNTER — Ambulatory Visit (HOSPITAL_COMMUNITY)
Admission: RE | Admit: 2024-08-01 | Discharge: 2024-08-01 | Disposition: A | Discharge status: Home / Self Care | Source: Ambulatory Visit | Attending: Vascular Surgery | Admitting: Vascular Surgery

## 2024-08-01 DIAGNOSIS — M79604 Pain in right leg: Secondary | ICD-10-CM | POA: Diagnosis not present

## 2024-08-01 LAB — CBC
Hematocrit: 43.4 % (ref 34.0–46.6)
Hemoglobin: 13.9 g/dL (ref 11.1–15.9)
MCH: 29 pg (ref 26.6–33.0)
MCHC: 32 g/dL (ref 31.5–35.7)
MCV: 91 fL (ref 79–97)
Platelets: 268 x10E3/uL (ref 150–450)
RBC: 4.79 x10E6/uL (ref 3.77–5.28)
RDW: 13.4 % (ref 11.7–15.4)
WBC: 8.8 x10E3/uL (ref 3.4–10.8)

## 2024-08-01 NOTE — Assessment & Plan Note (Signed)
 Patient reports a few days of worsening right leg pain, worsened by weightbearing, likely musculoskeletal in origin. Infectious etiology is unlikely given absence of erythema or effusion in the right knee.  Due to history of DVT and recent prolonged anticoagulation interruption, vascular imaging is warranted to rule out venous thrombosis. - Stat VAS US  RLE ordered. - Will get x-ray of right knee. - Advised to use Tylenol , and Voltaren  gel for the knee pain.

## 2024-08-02 ENCOUNTER — Ambulatory Visit: Payer: Self-pay | Admitting: Student

## 2024-08-03 ENCOUNTER — Ambulatory Visit (HOSPITAL_COMMUNITY)
Admission: RE | Admit: 2024-08-03 | Discharge: 2024-08-03 | Disposition: A | Discharge status: Home / Self Care | Source: Ambulatory Visit

## 2024-08-03 ENCOUNTER — Telehealth: Payer: Self-pay | Admitting: *Deleted

## 2024-08-03 DIAGNOSIS — M1711 Unilateral primary osteoarthritis, right knee: Secondary | ICD-10-CM | POA: Diagnosis not present

## 2024-08-03 DIAGNOSIS — M79604 Pain in right leg: Secondary | ICD-10-CM | POA: Diagnosis not present

## 2024-08-03 DIAGNOSIS — M25561 Pain in right knee: Secondary | ICD-10-CM | POA: Diagnosis not present

## 2024-08-03 NOTE — Addendum Note (Signed)
 Addended by: CELESTINA CZAR on: 08/03/2024 11:20 AM   Modules accepted: Orders

## 2024-08-03 NOTE — Telephone Encounter (Signed)
 Received call transfer for call center Pt states she received a call about getting xray of knee and where to go Order placed today by Dr Celestina for [facility] Knee Complete 4 Views Right   Pt can have films done at [facility] located at [address] [place], [place], [place] [address], but pt prefers to go to radiology dept at [facility], which is also acceptable.  No appt  needed, pt can obtain when convenient for her.

## 2024-08-07 ENCOUNTER — Telehealth: Payer: Self-pay | Admitting: *Deleted

## 2024-08-07 NOTE — Telephone Encounter (Unsigned)
 Copied from CRM #[id number]. Topic: Clinical - Lab/Test Results >> Aug 07, 2024  2:43 PM Mercer PEDLAR wrote: Reason for CRM: Patient would like callback with recent X-ray results.

## 2024-08-07 NOTE — Progress Notes (Signed)
Internal Medicine Clinic Attending  I was physically present during the key portions of the resident provided service and participated in the medical decision making of patient's management care. I reviewed pertinent patient test results.  The assessment, diagnosis, and plan were formulated together and I agree with the documentation in the resident's note.  Williams, Julie Anne, MD  

## 2024-08-08 ENCOUNTER — Encounter: Payer: Self-pay | Admitting: *Deleted

## 2024-08-08 ENCOUNTER — Encounter

## 2024-08-08 NOTE — Telephone Encounter (Signed)
 Patient was here today and was informed that results will be called to her when the X-ray is read by Radiology.  Patient was given instructions per Dr. Rea about taking Tylenol  and alternation Ibuprofen  for pain relief .  Patient was given instruction to take Ibuprofen  400 mg every 6 hours for up to a week.  Alternate with Tylenol  650 mg every 6 hours.  Patient should be taking something every 3 hours. To take even when pain ha=s decreased.  Patient was asked to call in 1 week if no relief for other options.  Patient voiced understanding of the plan.

## 2024-09-01 ENCOUNTER — Other Ambulatory Visit (HOSPITAL_COMMUNITY): Payer: Self-pay

## 2024-09-04 ENCOUNTER — Other Ambulatory Visit (HOSPITAL_COMMUNITY): Payer: Self-pay

## 2024-09-04 MED ORDER — CAPSAICIN 0.025 % EX CREA: 1.0000 | TOPICAL_CREAM | CUTANEOUS | 3 refills | Status: AC | PRN

## 2024-09-05 ENCOUNTER — Other Ambulatory Visit (HOSPITAL_COMMUNITY): Payer: Self-pay

## 2024-09-05 MED FILL — Gabapentin Cap 300 MG: ORAL | 30 days supply | Qty: 90 | Fill #0 | Status: CN

## 2024-09-06 ENCOUNTER — Other Ambulatory Visit (HOSPITAL_COMMUNITY): Payer: Self-pay

## 2024-09-06 ENCOUNTER — Encounter: Payer: Self-pay | Admitting: Internal Medicine

## 2024-09-06 ENCOUNTER — Other Ambulatory Visit: Payer: Self-pay

## 2024-09-06 ENCOUNTER — Ambulatory Visit (INDEPENDENT_AMBULATORY_CARE_PROVIDER_SITE_OTHER): Payer: Self-pay | Admitting: Internal Medicine

## 2024-09-06 VITALS — BP 133/80 | HR 72 | Temp 97.9°F | Ht 67.0 in | Wt 205.6 lb

## 2024-09-06 DIAGNOSIS — I10 Essential (primary) hypertension: Secondary | ICD-10-CM

## 2024-09-06 DIAGNOSIS — E785 Hyperlipidemia, unspecified: Secondary | ICD-10-CM | POA: Diagnosis not present

## 2024-09-06 DIAGNOSIS — Z79899 Other long term (current) drug therapy: Secondary | ICD-10-CM

## 2024-09-06 DIAGNOSIS — E119 Type 2 diabetes mellitus without complications: Secondary | ICD-10-CM | POA: Diagnosis not present

## 2024-09-06 DIAGNOSIS — E1169 Type 2 diabetes mellitus with other specified complication: Secondary | ICD-10-CM

## 2024-09-06 DIAGNOSIS — G5712 Meralgia paresthetica, left lower limb: Secondary | ICD-10-CM | POA: Diagnosis not present

## 2024-09-06 DIAGNOSIS — Z87891 Personal history of nicotine dependence: Secondary | ICD-10-CM | POA: Diagnosis not present

## 2024-09-06 DIAGNOSIS — R0989 Other specified symptoms and signs involving the circulatory and respiratory systems: Secondary | ICD-10-CM | POA: Diagnosis not present

## 2024-09-06 DIAGNOSIS — M79604 Pain in right leg: Secondary | ICD-10-CM | POA: Diagnosis not present

## 2024-09-06 LAB — POCT GLYCOSYLATED HEMOGLOBIN (HGB A1C): HbA1c, POC (controlled diabetic range): 5.8 % (ref 0.0–7.0)

## 2024-09-06 LAB — GLUCOSE, CAPILLARY: Glucose-Capillary: 114 mg/dL — ABNORMAL HIGH (ref 70–99)

## 2024-09-06 MED FILL — Gabapentin Cap 300 MG: ORAL | 30 days supply | Qty: 90 | Fill #0 | Status: CN

## 2024-09-06 NOTE — Assessment & Plan Note (Signed)
 Pt declined labs today and stated she'd plan to complete at last office visit.  Historically controlled on her current statin which will continue.

## 2024-09-06 NOTE — Assessment & Plan Note (Addendum)
 Took medication today, well controlled 133/80 on losartan  50 mg daily which she will continue.  She declined labwork to assess renal fxn and electrolytes.

## 2024-09-06 NOTE — Assessment & Plan Note (Signed)
 On no medication after metformin  stopped 11/2023, A1c is 5.8.  Foot exam completed today with normal sensation, no lesions, normal color and temperature, though absent pedal pulses.  She reports constantly cold feet but unfortunately declined office ABIs today.

## 2024-09-06 NOTE — Assessment & Plan Note (Signed)
 Diabetes foot exam completed today with normal sensation, no lesions, normal color and temperature, though absent pedal pulses.  She reports constantly cold feet; no claudication. Unfortunately declined office ABIs today.  Hadn't had breakfast or fluids today - recheck pulses next visit.

## 2024-09-06 NOTE — Assessment & Plan Note (Signed)
 Much improvement with capsaisin cream; requests a refill.

## 2024-09-06 NOTE — Progress Notes (Unsigned)
 78 y.o. Yvonne [name] is here for routine follow-up of DM2 diet controlled (metformin  stopped 11/2023), hyperlipidemia, HTN, L meralgia paresthetica, and R leg/knee pain I am seeing her in the [facility] resident clinic due to scheduling issues. Her most recent primary care visit was in 11/2023 with plan for return appt in 6 months.  She presented in 07/2024 for acute visit to address R leg pain.  Given her hypercoagulable history, doppler was obtained; negative for DVT, incidental popliteal structure, but pain had been largely anterior knee.  She reports pain significantly improved with ibuprofen  400 mg about every 5 hours x a couple weeks.  Pain primarily in anterior knee.  Also sleeping better because of less pain.  The L anterior thigh pain has also eased off (meralgia paresthetica) - uses  capsaisin cream, beneficial, she is out, using [employer] on [address] and requests a refill (they had been out of a supply).    No new problems.  She wants to lose weight; getting ready for grandson's wedding next week.  Much to look forward to! She is not wanting to stay in clinic for full assessment today I don't like coming to the doctor. Says she's hungry and needs to get home.  We were able to complete a quick foot exam and review her chronic conditions.  She declined a flu shot.   Patient Active Problem List   Diagnosis Date Noted   Absent pedal pulses 09/06/2024   Lupus anticoagulant with hypercoagulable state (HCC) 12/03/2023   Hearing loss 12/01/2023   Meralgia paresthetica, left lower limb 09/01/2023   Back pain 08/11/2023   Increased urinary frequency 08/11/2023   Umbilical hernia without obstruction and without gangrene 05/05/2023   Personal history of endometrial adenocarcinoma (HCC) 04/17/2023   Right leg pain 04/14/2023   Hyperlipidemia 01/29/2023   Chronic pain 01/03/2023   History of deep venous thrombosis (DVT) of distal vein of right lower extremity 12/02/2022   Hot flashes  related to aromatase inhibitor therapy 11/20/2013   Osteopenia 09/13/2012   Diet-controlled type 2 diabetes mellitus (HCC) 04/10/2012   Hypertension 04/10/2012   Osteoarthritis 04/10/2012   Malignant neoplasm of upper-outer quadrant of right breast in female, estrogen receptor positive (HCC) 03/29/2012    Current Outpatient Medications:    capsaicin  (ZOSTRIX) 0.025 % cream, Apply 1 Application topically as needed to painful areas on legs and knee. DO NOT GET IN THE EYES., Disp: 60 g, Rfl: 3   gabapentin  (NEURONTIN ) 300 MG capsule, Take 3 capsules (900 mg total) by mouth at bedtime., Disp: 90 capsule, Rfl: 6   losartan  (COZAAR ) 50 MG tablet, Take 1 tablet (50 mg total) by mouth daily., Disp: 90 tablet, Rfl: 3   rosuvastatin  (CRESTOR ) 40 MG tablet, Take 1 tablet (40 mg total) by mouth daily., Disp: 90 tablet, Rfl: 3   rivaroxaban  (XARELTO ) 2.5 MG TABS tablet, Take 1 tablet (2.5 mg total) by mouth daily., Disp: 30 tablet, Rfl: 5  Objective BP 133/80 (BP Location: Left Arm, Patient Position: Sitting, Cuff Size: Large)   Pulse 72   Temp 97.9 F (36.6 C) (Oral)   Ht 5' 7 (1.702 m)   Wt 205 lb 9.6 oz (93.3 kg)   SpO2 95%   BMI 32.20 kg/m   Exam: Feet are sensate, no lesions, normal color, cool, absent pedal pulses my feet stay cold.  No LE edema.  Skin turgor normal. Walks slowly with stable but antalgic stiff gait favoring R side; carries cane in R hand.  Very HOH -   Assessment and plan:  Primary hypertension Assessment & Plan: Took medication today, well controlled 133/80 on losartan  50 mg daily which she will continue.  She declined labwork to assess renal fxn and electrolytes.   Meralgia paresthetica, left lower limb Assessment & Plan: Much improvement with capsaisin cream; requests a refill.    Right leg pain Assessment & Plan: Neg doppler for DVT; incidental finding popliteal cyst, though pain is not posterior.  She localizes the pain to anterior knee area and reports  considerable improvement with ibuprofen  400 mg (2 x 200 mg) about every 5-6 hours.  Declined renal function lab today.   Diet-controlled type 2 diabetes mellitus (HCC) Assessment & Plan: On no medication after metformin  stopped 11/2023, A1c is 5.8.  Foot exam completed today with normal sensation, no lesions, normal color and temperature, though absent pedal pulses.  She reports constantly cold feet but unfortunately declined office ABIs today.    Absent pedal pulses Assessment & Plan: Diabetes foot exam completed today with normal sensation, no lesions, normal color and temperature, though absent pedal pulses.  She reports constantly cold feet; no claudication. Unfortunately declined office ABIs today.  Hadn't had breakfast or fluids today - recheck pulses next visit.   Hyperlipidemia, unspecified hyperlipidemia type Assessment & Plan: Pt declined labs today and stated she'd plan to complete at last office visit.  Historically controlled on her current statin which will continue.        Return in about 6 months (around 03/06/2025) for chronic condition monitoring.

## 2024-09-06 NOTE — Progress Notes (Unsigned)
 Yvonne [name]

## 2024-09-06 NOTE — Patient Instructions (Signed)
 Thank you for coming in today!  Your blood pressure and diabetes are in excellent control.  Keep taking your medicines, and we'll see you next time.  Take care and stay well,  Dr. Trudy

## 2024-09-06 NOTE — Assessment & Plan Note (Signed)
 Neg doppler for DVT; incidental finding popliteal cyst, though pain is not posterior.  She localizes the pain to anterior knee area and reports considerable improvement with ibuprofen  400 mg (2 x 200 mg) about every 5-6 hours.  Declined renal function lab today.

## 2024-09-11 ENCOUNTER — Other Ambulatory Visit (HOSPITAL_COMMUNITY): Payer: Self-pay

## 2024-09-11 MED FILL — Gabapentin Cap 300 MG: ORAL | 30 days supply | Qty: 90 | Fill #0 | Status: CN

## 2024-09-11 MED FILL — Gabapentin Cap 300 MG: ORAL | 30 days supply | Qty: 90 | Fill #0 | Status: AC

## 2024-09-14 ENCOUNTER — Ambulatory Visit: Admitting: Gynecologic Oncology

## 2024-09-19 ENCOUNTER — Encounter: Payer: Self-pay | Admitting: Gynecologic Oncology

## 2024-09-21 ENCOUNTER — Inpatient Hospital Stay: Attending: Gynecologic Oncology | Admitting: Gynecologic Oncology

## 2024-09-21 ENCOUNTER — Other Ambulatory Visit: Payer: Self-pay

## 2024-09-21 ENCOUNTER — Encounter: Payer: Self-pay | Admitting: Gynecologic Oncology

## 2024-09-21 VITALS — BP 134/62 | HR 66 | Temp 98.0°F | Resp 19 | Wt 203.0 lb

## 2024-09-21 DIAGNOSIS — Z8542 Personal history of malignant neoplasm of other parts of uterus: Secondary | ICD-10-CM | POA: Insufficient documentation

## 2024-09-21 DIAGNOSIS — Z90722 Acquired absence of ovaries, bilateral: Secondary | ICD-10-CM | POA: Insufficient documentation

## 2024-09-21 DIAGNOSIS — Z9079 Acquired absence of other genital organ(s): Secondary | ICD-10-CM | POA: Insufficient documentation

## 2024-09-21 DIAGNOSIS — Z90710 Acquired absence of both cervix and uterus: Secondary | ICD-10-CM | POA: Insufficient documentation

## 2024-09-21 DIAGNOSIS — C541 Malignant neoplasm of endometrium: Secondary | ICD-10-CM

## 2024-09-21 MED FILL — Rivaroxaban Tab 2.5 MG: ORAL | 30 days supply | Qty: 30 | Fill #0 | Status: CN

## 2024-09-21 NOTE — Progress Notes (Signed)
 Gynecologic Oncology Return Clinic Visit  09/21/24  Reason for Visit: surveillance   Treatment History: She presented to the ED with vaginal bleeding and hematochezia after recently starting Xarelto . She was seen for outpatient follow-up by OBGYN after pelvic ultrasound during her short hospitalization showed a uterus measuring 9 x 5 x 6 cm with a large central 6.2 cm endometrial vs myometrial mass. 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 The patient was started on 10 mg Provera  at her follow-up visit on 5/6.    She has a history of breast cancer, provoked DVT (and possible a PE at the time of a pregnancy) and an unprovoked DVT in 2021. Work up revealed positive lupus anticoagulant.  She had initially been on Eliquis , was transition to Xarelto .  She was on prophylactic dosing until recently around the time of her vaginal bleeding.  Lower extremity Doppler in late April was negative for DVT on the right.  In terms of her breast cancer, this was stage IIb invasive ductal carcinoma, grade 2, ER/PR positive, HER2 negative.  She underwent modified radical mastectomy in 2013 followed by 1 cycle of adjuvant chemotherapy.  She declined further chemotherapy as well as radiation.  She was treated with anastrozole  for 5 years, completed in late 2018.  In terms of her type 2 diabetes, the patient takes metformin  twice daily.  Blood glucose at home is typically between 100-110.  Her last hemoglobin A1c in early April was 5.9%.   05/05/23: Robotic-assisted laparoscopic total hysterectomy with bilateral salpingoophorectomy, SLN biopsy, umbilical hernia repair  Path: grade 2, endometrioid, 95% MI, +LVI (>5 foci), negative SLNs MMRp, p53 WT  Interval History: Doing well.  Denies any vaginal bleeding.  No pelvic or abdominal pain.  Baseline bowel and bladder function.  Past Medical/Surgical History: Past Medical History:  Diagnosis Date   Acute deep vein thrombosis (DVT) of popliteal vein of  right lower extremity (HCC) 09/12/2020   AKI (acute kidney injury) 02/09/2013   Arthritis    Breast cancer (HCC) 04/2012   right breast   Bruises easily    Cancer (HCC) 03/25/2012   right breast   Cellulitis of right lower extremity 04/17/2023   Chronic back pain    arthritis   Diabetes mellitus without complication (HCC)    Diet-controlled type 2 diabetes mellitus (HCC) 04/10/2012   Elevated cholesterol    takes Crestor  daily   Hemorrhoids    History of chemotherapy    x 1, pt unable to tolerate, on PO chemotherpay   History of deep venous thrombosis (DVT) of distal vein of right lower extremity 12/02/2022   Hypertension    takes Maxzide  daily    Lupus anticoagulant with hypercoagulable state 12/03/2023   Meralgia paresthetica, left lower limb 09/01/2023   Personal history of endometrial adenocarcinoma (HCC) 04/17/2023   Phlebitis 62yrs ago   hx of-   Seasonal allergies    takes Zyrtec daily and Nasal Spray prn    Past Surgical History:  Procedure Laterality Date   APPENDECTOMY  22yrs ago   BREAST EXCISIONAL BIOPSY Left    BREAST SURGERY  04/27/12   Right mod mastectomy, ER/PR +, HER2 -   BREAST SURGERY  04/27/12   Left Breast NL lumpectomy-no malignancy   COLONOSCOPY     MASTECTOMY Right 2013   PORT-A-CATH REMOVAL  11/24/2012   Procedure: REMOVAL PORT-A-CATH;  Surgeon: Redell Faith, DO;  Location: [facility];  Service: General;  Laterality: Left;   PORTACATH PLACEMENT  04/26/2012  Procedure: INSERTION PORT-A-CATH;  Surgeon: Redell Faith, DO;  Location: [facility];  Service: General;  Laterality: Left;  Started at 1746.   ROBOTIC ASSISTED TOTAL HYSTERECTOMY WITH BILATERAL SALPINGO OOPHERECTOMY N/A 05/05/2023   Procedure: XI ROBOTIC ASSISTED TOTAL HYSTERECTOMY WITH BILATERAL SALPINGO OOPHORECTOMY;  Surgeon: Viktoria Comer SAUNDERS, MD;  Location: [facility];  Service: Gynecology;  Laterality: N/A;   SENTINEL NODE BIOPSY N/A 05/05/2023   Procedure: SENTINEL NODE BIOPSY;   Surgeon: Viktoria Comer SAUNDERS, MD;  Location: [facility];  Service: Gynecology;  Laterality: N/A;   TUBAL LIGATION  48yrs ago   UMBILICAL HERNIA REPAIR N/A 05/05/2023   Procedure: HERNIA REPAIR UMBILICAL ADULT;  Surgeon: Viktoria Comer SAUNDERS, MD;  Location: [facility];  Service: Gynecology;  Laterality: N/A;    Family History  Problem Relation Age of Onset   Cancer Mother        ovarian cancer vs uterine   Leukemia Father    Cancer Father        leukemia   Cancer Sister 73       colon cancer   Colon cancer Sister    Cancer Brother 36       colon cancer   Colon cancer Brother    Cancer Brother        prostate cancer, deceased   Anesthesia problems Neg Hx    Hypotension Neg Hx    Malignant hyperthermia Neg Hx    Pseudochol deficiency Neg Hx    Breast cancer Neg Hx    Ovarian cancer Neg Hx    Endometrial cancer Neg Hx    Pancreatic cancer Neg Hx    Prostate cancer Neg Hx     Social History   Socioeconomic History   Marital status: Married    Spouse name: Not on file   Number of children: 5   Years of education: Not on file   Highest education level: Not on file  Occupational History   Not on file  Tobacco Use   Smoking status: Former    Current packs/day: 0.00    Types: Cigarettes    Quit date: 01/10/1984    Years since quitting: 40.7   Smokeless tobacco: Never   Tobacco comments:    quit 30+yrs ago  Vaping Use   Vaping status: Never Used  Substance and Sexual Activity   Alcohol  use: No   Drug use: No   Sexual activity: Yes    Birth control/protection: Post-menopausal    Comment: menarche age 40, P5,, 1st pregancy 43, menop 39, no HRT  Other Topics Concern   Not on file  Social History Narrative   Not on file   Social Drivers of Health   Financial Resource Strain: Medium Risk (09/06/2024)   Overall Financial Resource Strain (CARDIA)    Difficulty of Paying Living Expenses: Somewhat hard  Food Insecurity: No Food Insecurity (09/06/2024)   Hunger Vital Sign     Worried About Running Out of Food in the Last Year: Never true    Ran Out of Food in the Last Year: Never true  Transportation Needs: No Transportation Needs (09/06/2024)   PRAPARE - Administrator, Civil Service (Medical): No    Lack of Transportation (Non-Medical): No  Physical Activity: Inactive (09/06/2024)   Exercise Vital Sign    Days of Exercise per Week: 0 days    Minutes of Exercise per Session: 0 min  Stress: No Stress Concern Present (09/06/2024)   [employer] of Occupational Health - Occupational Stress  Questionnaire    Feeling of Stress: Not at all  Social Connections: Moderately Integrated (09/06/2024)   Social Connection and Isolation Panel    Frequency of Communication with Friends and Family: More than three times a week    Frequency of Social Gatherings with Friends and Family: More than three times a week    Attends Religious Services: More than 4 times per year    Active Member of [employer] or Organizations: No    Attends Engineer, structural: Never    Marital Status: Married    Current Medications:  Current Outpatient Medications:    anastrozole  (ARIMIDEX ) 1 MG tablet, Oral, Disp: , Rfl:    aspirin  EC 81 MG tablet, Oral, Disp: , Rfl:    capsaicin  (ZOSTRIX) 0.025 % cream, Apply 1 Application topically as needed to painful areas on legs and knee. DO NOT GET IN THE EYES., Disp: 60 g, Rfl: 3   gabapentin  (NEURONTIN ) 300 MG capsule, Take 3 capsules (900 mg total) by mouth at bedtime., Disp: 90 capsule, Rfl: 6   losartan  (COZAAR ) 50 MG tablet, Take 1 tablet (50 mg total) by mouth daily., Disp: 90 tablet, Rfl: 3   rivaroxaban  (XARELTO ) 2.5 MG TABS tablet, Take 1 tablet (2.5 mg total) by mouth daily., Disp: 30 tablet, Rfl: 5   rosuvastatin  (CRESTOR ) 40 MG tablet, Take 1 tablet (40 mg total) by mouth daily., Disp: 90 tablet, Rfl: 3  Review of Systems: + hearing loss Denies appetite changes, fevers, chills, fatigue, unexplained weight changes. Denies  neck lumps or masses, mouth sores, ringing in ears or voice changes. Denies cough or wheezing.  Denies shortness of breath. Denies chest pain or palpitations. Denies leg swelling. Denies abdominal distention, pain, blood in stools, constipation, diarrhea, nausea, vomiting, or early satiety. Denies pain with intercourse, dysuria, frequency, hematuria or incontinence. Denies hot flashes, pelvic pain, vaginal bleeding or vaginal discharge.   Denies joint pain, back pain or muscle pain/cramps. Denies itching, rash, or wounds. Denies dizziness, headaches, numbness or seizures. Denies swollen lymph nodes or glands, denies easy bruising or bleeding. Denies anxiety, depression, confusion, or decreased concentration.  Physical Exam: BP (!) 144/63 (BP Location: Left Arm, Patient Position: Sitting)   Pulse 66   Temp 98 F (36.7 C) (Oral)   Resp 19   Wt 203 lb (92.1 kg)   SpO2 97%   BMI 31.79 kg/m  General: Alert, oriented, no acute distress. HEENT: Normocephalic, atraumatic, sclera anicteric. Chest: Clear to auscultation bilaterally.  No wheezes or rhonchi. Cardiovascular: Regular rate and rhythm, no murmurs. Abdomen: soft, nontender.  Normoactive bowel sounds.  No masses or hepatosplenomegaly appreciated.  Well-healed incisions. Extremities: Grossly normal range of motion.  Warm, well perfused.  Trace edema bilaterally. Skin: No rashes or lesions noted. Lymphatics: No cervical, supraclavicular, or inguinal adenopathy. GU: Normal appearing external genitalia without erythema, excoriation, or lesions.  Speculum exam reveals moderately atrophic vaginal mucosa, cuff intact, no visible lesions, no bladder discharge.  Bimanual exam reveals cuff intact, no nodularity or masses.  Rectovaginal exam confirms these findings.  Laboratory & Radiologic Studies: None new  Assessment & Plan: Yvonne [name] is a 78 y.o. 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 Surgery 04/2023. 95% MI, LVI+. MMR intact. P53 WT. ER+. Adjuvant radiation was recommended given high risk features, patient declined.    Patient id NED on exam today, continues to do very well.   Given high risk features, we will  continue with surveillance visits every 3 months.  We have previously discussed CT imaging given her high risk disease and no adjuvant treatment.  She has not been interested in doing this to date given cost.  We reviewed signs and symptoms that would be concerning for cancer recurrence, and I stressed the importance of calling if she develops any of these.  20 minutes of total time was spent for this patient encounter, including preparation, face-to-face counseling with the patient and coordination of care, and documentation of the encounter.  Comer Dollar, MD  Division of Gynecologic Oncology  Department of Obstetrics and Gynecology  [facility]

## 2024-09-21 NOTE — Patient Instructions (Signed)
 It was good to see you today.  I do not see or feel any evidence of cancer recurrence on your exam.  I will see you for follow-up in 3 months.  As always, if you develop any new and concerning symptoms before your next visit, please call to see me sooner.

## 2024-09-22 ENCOUNTER — Other Ambulatory Visit: Payer: Self-pay

## 2024-09-22 ENCOUNTER — Other Ambulatory Visit (HOSPITAL_COMMUNITY): Payer: Self-pay

## 2024-09-22 MED FILL — Rivaroxaban Tab 2.5 MG: ORAL | 30 days supply | Qty: 30 | Fill #0 | Status: AC

## 2024-10-09 ENCOUNTER — Other Ambulatory Visit (HOSPITAL_COMMUNITY): Payer: Self-pay

## 2024-10-09 MED FILL — Gabapentin Cap 300 MG: ORAL | 30 days supply | Qty: 90 | Fill #1 | Status: AC

## 2024-10-19 ENCOUNTER — Other Ambulatory Visit (HOSPITAL_COMMUNITY): Payer: Self-pay

## 2024-10-19 MED FILL — Rivaroxaban Tab 2.5 MG: ORAL | 30 days supply | Qty: 30 | Fill #1 | Status: AC

## 2024-11-06 ENCOUNTER — Other Ambulatory Visit (HOSPITAL_COMMUNITY): Payer: Self-pay

## 2024-11-06 MED FILL — Gabapentin Cap 300 MG: ORAL | 30 days supply | Qty: 90 | Fill #2 | Status: AC

## 2024-11-14 ENCOUNTER — Other Ambulatory Visit (HOSPITAL_COMMUNITY): Payer: Self-pay

## 2024-11-14 MED FILL — Rivaroxaban Tab 2.5 MG: ORAL | 30 days supply | Qty: 30 | Fill #2 | Status: AC

## 2024-11-27 ENCOUNTER — Other Ambulatory Visit: Payer: Self-pay | Admitting: Student in an Organized Health Care Education/Training Program

## 2024-11-27 DIAGNOSIS — Z1231 Encounter for screening mammogram for malignant neoplasm of breast: Secondary | ICD-10-CM

## 2024-11-30 ENCOUNTER — Telehealth: Payer: Self-pay | Admitting: *Deleted

## 2024-11-30 NOTE — Telephone Encounter (Signed)
 Will forward to PCP and Team.           Copied from CRM [id number]. Topic: Clinical - Medical Advice >> Nov 30, 2024 11:48 AM Alfonso ORN wrote: Reason for CRM: Mitzi calling from [facility] and patient is applying for the  chronic condition special needs  plan need current provider to verify if patient has current chronics condition  Would like for a call back if can do a verbal verification over the phone or can fax over to  [facility] health Fax number is [phone]

## 2024-12-05 ENCOUNTER — Other Ambulatory Visit (HOSPITAL_COMMUNITY): Payer: Self-pay

## 2024-12-05 MED FILL — Gabapentin Cap 300 MG: ORAL | 30 days supply | Qty: 90 | Fill #3 | Status: AC

## 2024-12-05 NOTE — Telephone Encounter (Signed)
 Contacted [facility] for chronic condition verification; patient has been confirmed as eligible for the service.

## 2024-12-10 MED FILL — Rivaroxaban Tab 2.5 MG: ORAL | 30 days supply | Qty: 30 | Fill #3 | Status: CN

## 2024-12-11 ENCOUNTER — Other Ambulatory Visit (HOSPITAL_COMMUNITY): Payer: Self-pay

## 2024-12-11 MED FILL — Rivaroxaban Tab 2.5 MG: ORAL | 30 days supply | Qty: 30 | Fill #3 | Status: AC

## 2024-12-12 ENCOUNTER — Other Ambulatory Visit (HOSPITAL_COMMUNITY): Payer: Self-pay

## 2024-12-18 ENCOUNTER — Other Ambulatory Visit (HOSPITAL_COMMUNITY): Payer: Self-pay

## 2024-12-18 ENCOUNTER — Ambulatory Visit: Admitting: Student

## 2024-12-18 VITALS — BP 148/84 | HR 81 | Temp 98.4°F | Ht 67.0 in | Wt 206.6 lb

## 2024-12-18 DIAGNOSIS — W19XXXA Unspecified fall, initial encounter: Secondary | ICD-10-CM

## 2024-12-18 DIAGNOSIS — W1830XA Fall on same level, unspecified, initial encounter: Secondary | ICD-10-CM

## 2024-12-18 DIAGNOSIS — M545 Low back pain, unspecified: Secondary | ICD-10-CM

## 2024-12-18 NOTE — Assessment & Plan Note (Signed)
 Ground-level mechanical fall 2 days ago while moving some equipment in her house.  She usually walks with a cane.  She reports no other falls in the past few years.  She felt her foot get caught on something and fell down onto her left side/hip.  She denies any head injury, head impact, presyncope, dizziness, or other prodromal symptoms.  She denies any radicular pain, new or worsening weakness, new or worsening numbness, fevers, changes in bowel or bladder control, or other red flag symptoms.  On exam she has some point tenderness in the paraspinal musculature of the mid lumbar spine on the left side without any spinal tenderness.  Lower extremity strength and sensation testing is normal.  She was able to get up from a chair and get onto the exam table without issue using her cane.  Overall consistent with soft tissue injury after mechanical ground-level fall.  She has been taking ibuprofen  over-the-counter along with Tylenol .  Discussed risk of bleeding with ibuprofen  and other NSAIDs and she is given alternative medications to use.  Return precautions discussed. - Avoid NSAIDs - Continue Tylenol  1000 mg every 8 hours and can use lidocaine  patches and/or Voltaren  gel - Strict return precautions for red flag symptoms or if no improvement in 2-4 weeks - Return in 3-4 months for routine visit

## 2024-12-18 NOTE — Patient Instructions (Addendum)
 Thank you, [name], for allowing us  to provide your care today. Today we discussed . . .  > Back pain       - I do not think you had any major injury after your fall a couple days ago.  I did not see any large bruises, cuts, or any signs of a fracture or broken bone in your back or hip.  I would like you to stop taking ibuprofen  or medicines like this which include Advil /ibuprofen , Aleve/naproxen, meloxicam, and Voltaren /diclofenac  pills.  You can continue taking Tylenol  1000 mg every 8 hours and you can use topical lidocaine  patches or Voltaren /diclofenac  gel.  The pill forms of these medicines which are called NSAIDs can increase your risk of bleeding especially since you are taking Xarelto  and aspirin .  If your pain does not improve in the next 2-4 weeks or you have any new or worsening symptoms please call our clinic.  If you have any weakness in your legs, pain is shooting down the back of your legs, new dizziness or lightheadedness, or repeated falls please go to the emergency department if you are unable to get an appointment with us  within a few hours.  We will plan to see you back in 3-4 months for your normal visit but please let us  know if you need anything before then.  Follow up: 3-4 months    Remember:  Should you have any questions or concerns please call the internal medicine clinic at [phone].     Fairy Pool, DO [facility]

## 2024-12-18 NOTE — Progress Notes (Signed)
 "  CC: Acute Concern of left-sided low back pain after a fall  HPI:  Yvonne [name] is a 78 y.o. female with pertinent PMH of hypertension, diet-controlled type 2 diabetes, prior DVT on anticoagulation, osteoarthritis of the knee, and chronic back pain who presents as above. Please see assessment and plan below for further details.  Medications: Current Outpatient Medications  Medication Instructions   aspirin  EC 81 MG tablet Oral   capsaicin  (ZOSTRIX) 0.025 % cream Apply 1 Application topically as needed to painful areas on legs and knee. DO NOT GET IN THE EYES.   gabapentin  (NEURONTIN ) 900 mg, Oral, Nightly   losartan  (COZAAR ) 50 mg, Oral, Daily   rivaroxaban  (XARELTO ) 2.5 mg, Oral, Daily   rosuvastatin  (CRESTOR ) 40 mg, Oral, Daily     Review of Systems:   Pertinent items noted in HPI and/or A&P.  Physical Exam:  Vitals:   12/18/24 0855 12/18/24 0901  BP: (!) 161/79 (!) 148/84  Pulse: 77 81  Temp: 98.4 F (36.9 C)   TempSrc: Oral   SpO2: 93%   Weight: 206 lb 9.6 oz (93.7 kg)   Height: 5' 7 (1.702 m)     Constitutional: Well-appearing elderly female. In no acute distress. HEENT: Normocephalic, atraumatic, Sclera non-icteric, PERRL, EOM intact Cardio:Regular rate and rhythm.  Pulm: Normal work of breathing on room air. MSK: Trace lower extremity edema bilaterally.  No spinal tenderness in the thoracic or lumbar spine.  Mild point tenderness in the mid lumbar paraspinal musculature on the left without bruising, laceration, or other abnormalities in the area.  No evidence of hematoma. Skin:Warm and dry. Neuro:Alert and oriented x3.  Strength and sensation intact in bilateral lower extremities. Psych:Pleasant mood and affect.   Assessment & Plan:   Assessment & Plan Acute left-sided low back pain without sciatica Fall, initial encounter Ground-level mechanical fall 2 days ago while moving some equipment in her house.  She usually walks with a cane.  She reports no  other falls in the past few years.  She felt her foot get caught on something and fell down onto her left side/hip.  She denies any head injury, head impact, presyncope, dizziness, or other prodromal symptoms.  She denies any radicular pain, new or worsening weakness, new or worsening numbness, fevers, changes in bowel or bladder control, or other red flag symptoms.  On exam she has some point tenderness in the paraspinal musculature of the mid lumbar spine on the left side without any spinal tenderness.  Lower extremity strength and sensation testing is normal.  She was able to get up from a chair and get onto the exam table without issue using her cane.  Overall consistent with soft tissue injury after mechanical ground-level fall.  She has been taking ibuprofen  over-the-counter along with Tylenol .  Discussed risk of bleeding with ibuprofen  and other NSAIDs and she is given alternative medications to use.  Return precautions discussed. - Avoid NSAIDs - Continue Tylenol  1000 mg every 8 hours and can use lidocaine  patches and/or Voltaren  gel - Strict return precautions for red flag symptoms or if no improvement in 2-4 weeks - Return in 3-4 months for routine visit  No orders of the defined types were placed in this encounter.    Return in about 3 months (around 03/18/2025) for Routine Follow Up.   Patient discussed with Dr. Jone Dauphin  Fairy Pool, DO [facility] Internal Medicine Resident PGY-3 Clinic Phone: [phone] Please contact the on call pager at [phone] for  any urgent or emergent needs. "

## 2024-12-18 NOTE — Progress Notes (Signed)
 Internal Medicine Clinic Attending  Case discussed with the resident at the time of the visit.  We reviewed the resident's history and exam and pertinent patient test results.  I agree with the assessment, diagnosis, and plan of care documented in the resident's note.

## 2024-12-25 ENCOUNTER — Ambulatory Visit
Admission: RE | Admit: 2024-12-25 | Discharge: 2024-12-25 | Disposition: A | Discharge status: Home / Self Care | Source: Ambulatory Visit | Attending: Student in an Organized Health Care Education/Training Program | Admitting: Student in an Organized Health Care Education/Training Program

## 2024-12-25 DIAGNOSIS — Z1231 Encounter for screening mammogram for malignant neoplasm of breast: Secondary | ICD-10-CM

## 2024-12-28 ENCOUNTER — Other Ambulatory Visit: Payer: Self-pay | Admitting: Student in an Organized Health Care Education/Training Program

## 2024-12-28 DIAGNOSIS — R928 Other abnormal and inconclusive findings on diagnostic imaging of breast: Secondary | ICD-10-CM

## 2025-01-02 ENCOUNTER — Other Ambulatory Visit: Payer: Self-pay

## 2025-01-02 MED FILL — Gabapentin Cap 300 MG: ORAL | 30 days supply | Qty: 90 | Fill #4 | Status: AC

## 2025-01-03 ENCOUNTER — Other Ambulatory Visit (HOSPITAL_COMMUNITY): Payer: Self-pay

## 2025-01-04 ENCOUNTER — Other Ambulatory Visit (HOSPITAL_COMMUNITY): Payer: Self-pay

## 2025-01-06 ENCOUNTER — Ambulatory Visit
Admission: RE | Admit: 2025-01-06 | Discharge: 2025-01-06 | Disposition: A | Discharge status: Home / Self Care | Source: Ambulatory Visit | Attending: Student in an Organized Health Care Education/Training Program | Admitting: Student in an Organized Health Care Education/Training Program

## 2025-01-06 DIAGNOSIS — R928 Other abnormal and inconclusive findings on diagnostic imaging of breast: Secondary | ICD-10-CM

## 2025-01-08 ENCOUNTER — Other Ambulatory Visit: Payer: Self-pay | Admitting: Student in an Organized Health Care Education/Training Program

## 2025-01-08 DIAGNOSIS — R921 Mammographic calcification found on diagnostic imaging of breast: Secondary | ICD-10-CM

## 2025-01-08 DIAGNOSIS — N6320 Unspecified lump in the left breast, unspecified quadrant: Secondary | ICD-10-CM

## 2025-01-11 ENCOUNTER — Inpatient Hospital Stay: Admitting: Gynecologic Oncology

## 2025-01-11 ENCOUNTER — Encounter

## 2025-01-11 ENCOUNTER — Other Ambulatory Visit: Payer: Self-pay | Admitting: Student

## 2025-01-11 ENCOUNTER — Encounter: Payer: Self-pay | Admitting: Gynecologic Oncology

## 2025-01-11 ENCOUNTER — Other Ambulatory Visit

## 2025-01-11 ENCOUNTER — Other Ambulatory Visit (HOSPITAL_COMMUNITY): Payer: Self-pay

## 2025-01-11 VITALS — BP 130/68 | HR 79 | Temp 99.2°F | Resp 19 | Wt 208.8 lb

## 2025-01-11 DIAGNOSIS — Z79890 Hormone replacement therapy (postmenopausal): Secondary | ICD-10-CM | POA: Diagnosis not present

## 2025-01-11 DIAGNOSIS — Z90722 Acquired absence of ovaries, bilateral: Secondary | ICD-10-CM | POA: Insufficient documentation

## 2025-01-11 DIAGNOSIS — Z90710 Acquired absence of both cervix and uterus: Secondary | ICD-10-CM | POA: Insufficient documentation

## 2025-01-11 DIAGNOSIS — G8929 Other chronic pain: Secondary | ICD-10-CM | POA: Diagnosis not present

## 2025-01-11 DIAGNOSIS — Z853 Personal history of malignant neoplasm of breast: Secondary | ICD-10-CM | POA: Insufficient documentation

## 2025-01-11 DIAGNOSIS — Z9079 Acquired absence of other genital organ(s): Secondary | ICD-10-CM | POA: Diagnosis not present

## 2025-01-11 DIAGNOSIS — C541 Malignant neoplasm of endometrium: Secondary | ICD-10-CM

## 2025-01-11 DIAGNOSIS — Z8542 Personal history of malignant neoplasm of other parts of uterus: Secondary | ICD-10-CM | POA: Diagnosis not present

## 2025-01-11 DIAGNOSIS — R760 Raised antibody titer: Secondary | ICD-10-CM

## 2025-01-11 NOTE — Patient Instructions (Signed)
 It was good to see you today.  I do not see or feel any evidence of cancer recurrence on your exam.  Given your worsening back pain, we will get a CT scan to make sure this is not related to cancer recurrence.  I will see you for follow-up in 3 months.  As always, if you develop any new and concerning symptoms before your next visit, please call to see me sooner.

## 2025-01-11 NOTE — Progress Notes (Signed)
 Gynecologic Oncology Return Clinic Visit  01/11/25  Reason for Visit: surveillance   Treatment History: She presented to the ED with vaginal bleeding and hematochezia after recently starting Xarelto . She was seen for outpatient follow-up by OBGYN after pelvic ultrasound during her short hospitalization showed a uterus measuring 9 x 5 x 6 cm with a large central 6.2 cm endometrial vs myometrial mass. 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 The patient was started on 10 mg Provera  at her follow-up visit on 5/6.    She has a history of breast cancer, provoked DVT (and possible a PE at the time of a pregnancy) and an unprovoked DVT in 2021. Work up revealed positive lupus anticoagulant.  She had initially been on Eliquis , was transition to Xarelto .  She was on prophylactic dosing until recently around the time of her vaginal bleeding.  Lower extremity Doppler in late April was negative for DVT on the right.  In terms of her breast cancer, this was stage IIb invasive ductal carcinoma, grade 2, ER/PR positive, HER2 negative.  She underwent modified radical mastectomy in 2013 followed by 1 cycle of adjuvant chemotherapy.  She declined further chemotherapy as well as radiation.  She was treated with anastrozole  for 5 years, completed in late 2018.  In terms of her type 2 diabetes, the patient takes metformin  twice daily.  Blood glucose at home is typically between 100-110.  Her last hemoglobin A1c in early April was 5.9%.   05/05/23: Robotic-assisted laparoscopic total hysterectomy with bilateral salpingoophorectomy, SLN biopsy, umbilical hernia repair  Path: grade 2, endometrioid, 95% MI, +LVI (>5 foci), negative SLNs MMRp, p53 WT  Interval History: Overall doing well.  Notes some worsening of her chronic low back pain with intermittent radiation down to her left pelvis.  Reports normal and frequent bowel function.  Denies any urinary symptoms.  Denies any vaginal bleeding.  Has had  more pain in her legs, some more swelling.  Past Medical/Surgical History: Past Medical History:  Diagnosis Date   Acute deep vein thrombosis (DVT) of popliteal vein of right lower extremity (HCC) 09/12/2020   AKI (acute kidney injury) 02/09/2013   Arthritis    Breast cancer (HCC) 04/2012   right breast   Bruises easily    Cancer (HCC) 03/25/2012   right breast   Cellulitis of right lower extremity 04/17/2023   Chronic back pain    arthritis   Diabetes mellitus without complication (HCC)    Diet-controlled type 2 diabetes mellitus (HCC) 04/10/2012   Elevated cholesterol    takes Crestor  daily   Hemorrhoids    History of chemotherapy    x 1, pt unable to tolerate, on PO chemotherpay   History of deep venous thrombosis (DVT) of distal vein of right lower extremity 12/02/2022   Hypertension    takes Maxzide  daily    Lupus anticoagulant with hypercoagulable state 12/03/2023   Meralgia paresthetica, left lower limb 09/01/2023   Personal history of endometrial adenocarcinoma (HCC) 04/17/2023   Phlebitis 10yrs ago   hx of-   Seasonal allergies    takes Zyrtec daily and Nasal Spray prn    Past Surgical History:  Procedure Laterality Date   APPENDECTOMY  59yrs ago   BREAST EXCISIONAL BIOPSY Left    BREAST SURGERY  04/27/12   Right mod mastectomy, ER/PR +, HER2 -   BREAST SURGERY  04/27/12   Left Breast NL lumpectomy-no malignancy   COLONOSCOPY     MASTECTOMY Right 2013   PORT-A-CATH REMOVAL  11/24/2012   Procedure: REMOVAL PORT-A-CATH;  Surgeon: Redell Faith, DO;  Location: [facility];  Service: General;  Laterality: Left;   PORTACATH PLACEMENT  04/26/2012   Procedure: INSERTION PORT-A-CATH;  Surgeon: Redell Faith, DO;  Location: [facility];  Service: General;  Laterality: Left;  Started at 1746.   ROBOTIC ASSISTED TOTAL HYSTERECTOMY WITH BILATERAL SALPINGO OOPHERECTOMY N/A 05/05/2023   Procedure: XI ROBOTIC ASSISTED TOTAL HYSTERECTOMY WITH BILATERAL SALPINGO OOPHORECTOMY;   Surgeon: Viktoria Comer SAUNDERS, MD;  Location: [facility];  Service: Gynecology;  Laterality: N/A;   SENTINEL NODE BIOPSY N/A 05/05/2023   Procedure: SENTINEL NODE BIOPSY;  Surgeon: Viktoria Comer SAUNDERS, MD;  Location: [facility];  Service: Gynecology;  Laterality: N/A;   TUBAL LIGATION  12yrs ago   UMBILICAL HERNIA REPAIR N/A 05/05/2023   Procedure: HERNIA REPAIR UMBILICAL ADULT;  Surgeon: Viktoria Comer SAUNDERS, MD;  Location: [facility];  Service: Gynecology;  Laterality: N/A;    Family History  Problem Relation Age of Onset   Cancer Mother        ovarian cancer vs uterine   Leukemia Father    Cancer Father        leukemia   Cancer Sister 65       colon cancer   Colon cancer Sister    Breast cancer Daughter    Cancer Brother 63       colon cancer   Colon cancer Brother    Cancer Brother        prostate cancer, deceased   Anesthesia problems Neg Hx    Hypotension Neg Hx    Malignant hyperthermia Neg Hx    Pseudochol deficiency Neg Hx    Ovarian cancer Neg Hx    Endometrial cancer Neg Hx    Pancreatic cancer Neg Hx    Prostate cancer Neg Hx     Social History   Socioeconomic History   Marital status: Married    Spouse name: Not on file   Number of children: 5   Years of education: Not on file   Highest education level: Not on file  Occupational History   Not on file  Tobacco Use   Smoking status: Former    Current packs/day: 0.00    Types: Cigarettes    Quit date: 01/10/1984    Years since quitting: 41.0   Smokeless tobacco: Never   Tobacco comments:    quit 30+yrs ago  Vaping Use   Vaping status: Never Used  Substance and Sexual Activity   Alcohol  use: No   Drug use: No   Sexual activity: Yes    Birth control/protection: Post-menopausal    Comment: menarche age 5, P5,, 1st pregancy 6, menop 65, no HRT  Other Topics Concern   Not on file  Social History Narrative   Not on file   Social Drivers of Health   Tobacco Use: Medium Risk (01/11/2025)   Patient History     Smoking Tobacco Use: Former    Smokeless Tobacco Use: Never    Passive Exposure: Not on file  Financial Resource Strain: Medium Risk (09/06/2024)   Overall Financial Resource Strain (CARDIA)    Difficulty of Paying Living Expenses: Somewhat hard  Food Insecurity: No Food Insecurity (09/06/2024)   Epic    Worried About Radiation Protection Practitioner of Food in the Last Year: Never true    Ran Out of Food in the Last Year: Never true  Transportation Needs: No Transportation Needs (09/06/2024)   Epic    Lack of  Transportation (Medical): No    Lack of Transportation (Non-Medical): No  Physical Activity: Inactive (09/06/2024)   Exercise Vital Sign    Days of Exercise per Week: 0 days    Minutes of Exercise per Session: 0 min  Stress: No Stress Concern Present (09/06/2024)   [employer] of Occupational Health - Occupational Stress Questionnaire    Feeling of Stress: Not at all  Social Connections: Moderately Integrated (09/06/2024)   Social Connection and Isolation Panel    Frequency of Communication with Friends and Family: More than three times a week    Frequency of Social Gatherings with Friends and Family: More than three times a week    Attends Religious Services: More than 4 times per year    Active Member of Clubs or Organizations: No    Attends Banker Meetings: Never    Marital Status: Married  Depression (PHQ2-9): Low Risk (12/18/2024)   Depression (PHQ2-9)    PHQ-2 Score: 0  Alcohol  Screen: Low Risk (09/06/2024)   Alcohol  Screen    Last Alcohol  Screening Score (AUDIT): 0  Housing: Unknown (09/06/2024)   Epic    Unable to Pay for Housing in the Last Year: No    Number of Times Moved in the Last Year: Not on file    Homeless in the Last Year: No  Utilities: Not At Risk (09/06/2024)   Epic    Threatened with loss of utilities: No  Health Literacy: Inadequate Health Literacy (09/06/2024)   B1300 Health Literacy    Frequency of need for help with medical instructions: Sometimes     Current Medications: Current Medications[1]  Review of Systems: Denies appetite changes, fevers, chills, fatigue, unexplained weight changes. Denies hearing loss, neck lumps or masses, mouth sores, ringing in ears or voice changes. Denies cough or wheezing.  Denies shortness of breath. Denies chest pain or palpitations. Denies leg swelling. Denies abdominal distention, pain, blood in stools, constipation, diarrhea, nausea, vomiting, or early satiety. Denies pain with intercourse, dysuria, frequency, hematuria or incontinence. Denies hot flashes, pelvic pain, vaginal bleeding or vaginal discharge.   Denies joint pain, back pain or muscle pain/cramps. Denies itching, rash, or wounds. Denies dizziness, headaches, numbness or seizures. Denies swollen lymph nodes or glands, denies easy bruising or bleeding. Denies anxiety, depression, confusion, or decreased concentration.  Physical Exam: BP (!) 149/72 (BP Location: Left Arm, Patient Position: Sitting)   Pulse 79   Temp 99.2 F (37.3 C) (Oral)   Resp 19   Wt 208 lb 12.8 oz (94.7 kg)   SpO2 95%   BMI 32.70 kg/m  General: Alert, oriented, no acute distress. HEENT: Normocephalic, atraumatic, sclera anicteric. Chest: Clear to auscultation bilaterally.  No wheezes or rhonchi. Cardiovascular: Regular rate and rhythm, no murmurs. Abdomen: soft, nontender.  Normoactive bowel sounds.  No masses or hepatosplenomegaly appreciated.  Well-healed incisions. Extremities: Grossly normal range of motion.  Warm, well perfused.  Trace edema bilaterally. Skin: No rashes or lesions noted. Lymphatics: No cervical, supraclavicular, or inguinal adenopathy. GU: Normal appearing external genitalia without erythema, excoriation, or lesions.  Speculum exam reveals moderately atrophic vaginal mucosa, cuff intact, no visible lesions, no bladder discharge.  Bimanual exam reveals cuff intact, no nodularity or masses.  Rectovaginal exam confirms these findings,  some stool within the rectum.  Laboratory & Radiologic Studies: None new  Assessment & Plan: Yvonne [name] is a 79 y.o. 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 Surgery 04/2023.  95% MI, LVI+. MMR intact. P53 WT. ER+. Adjuvant radiation was recommended given high risk features, patient declined.    Patient is NED on exam today, continues to do very well.  No lower extremity edema appreciated on exam today.  Given worsening of some chronic symptoms, discussed getting CT scan to rule out metastatic disease, especially due to her high risk features.  Patient amenable.  This was ordered and scheduled.   We will continue with visit every 3 months at this time. We reviewed signs and symptoms that would be concerning for cancer recurrence, and I stressed the importance of calling if she develops any of these.  20 minutes of total time was spent for this patient encounter, including preparation, face-to-face counseling with the patient and coordination of care, and documentation of the encounter.  Comer Dollar, MD  Division of Gynecologic Oncology  Department of Obstetrics and Gynecology  [facility]      [1]  Current Outpatient Medications:    gabapentin  (NEURONTIN ) 300 MG capsule, Take 3 capsules (900 mg total) by mouth at bedtime., Disp: 90 capsule, Rfl: 6   losartan  (COZAAR ) 50 MG tablet, Take 1 tablet (50 mg total) by mouth daily., Disp: 90 tablet, Rfl: 3   rivaroxaban  (XARELTO ) 2.5 MG TABS tablet, Take 1 tablet (2.5 mg total) by mouth daily., Disp: 30 tablet, Rfl: 5   rosuvastatin  (CRESTOR ) 40 MG tablet, Take 1 tablet (40 mg total) by mouth daily., Disp: 90 tablet, Rfl: 3   aspirin  EC 81 MG tablet, Oral, Disp: , Rfl:    capsaicin  (ZOSTRIX) 0.025 % cream, Apply 1 Application topically as needed to painful areas on legs and knee. DO NOT GET IN THE EYES.,  Disp: 60 g, Rfl: 3

## 2025-01-13 ENCOUNTER — Other Ambulatory Visit (HOSPITAL_COMMUNITY): Payer: Self-pay

## 2025-01-13 MED ORDER — RIVAROXABAN 2.5 MG PO TABS
2.5000 mg | ORAL_TABLET | Freq: Every day | ORAL | 5 refills | Status: AC
  Filled 2025-01-13 – 2025-01-15 (×2): qty 30, 30d supply, fill #0

## 2025-01-15 ENCOUNTER — Other Ambulatory Visit: Payer: Self-pay

## 2025-01-18 ENCOUNTER — Ambulatory Visit (HOSPITAL_COMMUNITY)
Admission: RE | Admit: 2025-01-18 | Discharge: 2025-01-18 | Disposition: A | Discharge status: Home / Self Care | Source: Ambulatory Visit | Attending: Gynecologic Oncology | Admitting: Gynecologic Oncology

## 2025-01-18 DIAGNOSIS — C541 Malignant neoplasm of endometrium: Secondary | ICD-10-CM | POA: Diagnosis present

## 2025-01-18 MED ORDER — IOHEXOL 300 MG/ML  SOLN
100.0000 mL | Freq: Once | INTRAMUSCULAR | Status: AC | PRN
  Administered 2025-01-18: 100 mL via INTRAVENOUS

## 2025-01-18 MED ORDER — IOHEXOL 9 MG/ML PO SOLN: 500.0000 mL | ORAL | Status: DC

## 2025-01-18 MED ORDER — IOHEXOL 9 MG/ML PO SOLN: 1000.0000 mL | ORAL | Status: AC

## 2025-01-24 ENCOUNTER — Ambulatory Visit: Payer: Self-pay | Admitting: Gynecologic Oncology

## 2025-01-24 ENCOUNTER — Ambulatory Visit
Admission: RE | Admit: 2025-01-24 | Discharge: 2025-01-24 | Disposition: A | Source: Ambulatory Visit | Attending: Student in an Organized Health Care Education/Training Program | Admitting: Student in an Organized Health Care Education/Training Program

## 2025-01-24 ENCOUNTER — Ambulatory Visit
Admission: RE | Admit: 2025-01-24 | Discharge: 2025-01-24 | Disposition: A | Source: Ambulatory Visit | Attending: Student in an Organized Health Care Education/Training Program

## 2025-01-24 DIAGNOSIS — N6320 Unspecified lump in the left breast, unspecified quadrant: Secondary | ICD-10-CM

## 2025-01-24 DIAGNOSIS — R921 Mammographic calcification found on diagnostic imaging of breast: Secondary | ICD-10-CM

## 2025-01-24 NOTE — Telephone Encounter (Signed)
 Spoke with patient and relayed message from Dr. Viktoria that there are no signs of recurrent cancer in her abdomen/pelvis. There are some small lung spots that would be good to follow with repeat imaging in 3-4 months.   Patient verbalized understanding and thanked the office for calling.

## 2025-01-24 NOTE — Telephone Encounter (Signed)
-----   Message from Comer Dollar, MD sent at 01/24/2025  4:07 PM EST ----- Could you please call this patient and her husband to let them no that there are no signs of recurrent cancer in her abdomen/pelvis. There are some small lung spots that would be good to follow with  repeat imaging in 3-4 months. Thank you!

## 2025-01-25 LAB — SURGICAL PATHOLOGY

## 2025-04-20 ENCOUNTER — Inpatient Hospital Stay: Admitting: Gynecologic Oncology
